# Patient Record
Sex: Female | Born: 1935 | Race: White | Hispanic: No | Marital: Married | Smoking: Never smoker
Health system: Southern US, Community
[De-identification: ages and names within clinical notes are randomized; demographics above are authoritative.]

## PROBLEM LIST (undated history)

## (undated) DIAGNOSIS — G4730 Sleep apnea, unspecified: Secondary | ICD-10-CM

## (undated) DIAGNOSIS — R609 Edema, unspecified: Secondary | ICD-10-CM

## (undated) DIAGNOSIS — D649 Anemia, unspecified: Secondary | ICD-10-CM

## (undated) DIAGNOSIS — E079 Disorder of thyroid, unspecified: Secondary | ICD-10-CM

## (undated) DIAGNOSIS — I10 Essential (primary) hypertension: Secondary | ICD-10-CM

## (undated) DIAGNOSIS — I82409 Acute embolism and thrombosis of unspecified deep veins of unspecified lower extremity: Secondary | ICD-10-CM

## (undated) DIAGNOSIS — H409 Unspecified glaucoma: Secondary | ICD-10-CM

## (undated) DIAGNOSIS — E785 Hyperlipidemia, unspecified: Secondary | ICD-10-CM

## (undated) DIAGNOSIS — IMO0002 Reserved for concepts with insufficient information to code with codable children: Secondary | ICD-10-CM

## (undated) DIAGNOSIS — N840 Polyp of corpus uteri: Secondary | ICD-10-CM

## (undated) DIAGNOSIS — H9190 Unspecified hearing loss, unspecified ear: Secondary | ICD-10-CM

## (undated) DIAGNOSIS — D689 Coagulation defect, unspecified: Secondary | ICD-10-CM

## (undated) DIAGNOSIS — K469 Unspecified abdominal hernia without obstruction or gangrene: Secondary | ICD-10-CM

## (undated) DIAGNOSIS — K219 Gastro-esophageal reflux disease without esophagitis: Secondary | ICD-10-CM

## (undated) DIAGNOSIS — K449 Diaphragmatic hernia without obstruction or gangrene: Secondary | ICD-10-CM

## (undated) DIAGNOSIS — H269 Unspecified cataract: Secondary | ICD-10-CM

## (undated) DIAGNOSIS — R32 Unspecified urinary incontinence: Secondary | ICD-10-CM

## (undated) DIAGNOSIS — M1990 Unspecified osteoarthritis, unspecified site: Secondary | ICD-10-CM

## (undated) DIAGNOSIS — Z8711 Personal history of peptic ulcer disease: Secondary | ICD-10-CM

## (undated) DIAGNOSIS — Z820 Family history of epilepsy and other diseases of the nervous system: Secondary | ICD-10-CM

## (undated) DIAGNOSIS — I999 Unspecified disorder of circulatory system: Secondary | ICD-10-CM

## (undated) DIAGNOSIS — R791 Abnormal coagulation profile: Secondary | ICD-10-CM

## (undated) DIAGNOSIS — Z86718 Personal history of other venous thrombosis and embolism: Secondary | ICD-10-CM

## (undated) DIAGNOSIS — E039 Hypothyroidism, unspecified: Secondary | ICD-10-CM

## (undated) HISTORY — DX: Family history of epilepsy and other diseases of the nervous system: Z82.0

## (undated) HISTORY — DX: Personal history of peptic ulcer disease: Z87.11

## (undated) HISTORY — DX: Personal history of other venous thrombosis and embolism: Z86.718

## (undated) HISTORY — DX: Unspecified disorder of circulatory system: I99.9

## (undated) HISTORY — PX: PR REVISION PERI-IMPLANT CAPSULE BREAST: 19370

## (undated) HISTORY — DX: Abnormal coagulation profile: R79.1

## (undated) HISTORY — DX: Essential (primary) hypertension: I10

## (undated) HISTORY — DX: Hypothyroidism, unspecified: E03.9

## (undated) HISTORY — PX: PR THYROIDECTOMY: 60240

## (undated) HISTORY — DX: Unspecified hearing loss, unspecified ear: H91.90

## (undated) HISTORY — DX: Unspecified osteoarthritis, unspecified site: M19.90

## (undated) HISTORY — DX: Unspecified abdominal hernia without obstruction or gangrene: K46.9

## (undated) HISTORY — PX: TONSILLECTOMY: SUR1361

## (undated) HISTORY — DX: Sleep apnea, unspecified: G47.30

## (undated) HISTORY — DX: Acute embolism and thrombosis of unspecified deep veins of unspecified lower extremity: I82.409

## (undated) HISTORY — DX: Disorder of thyroid, unspecified: E07.9

## (undated) HISTORY — PX: HERNIA REPAIR: SHX51

## (undated) HISTORY — PX: EYE SURGERY: SHX253

## (undated) HISTORY — DX: Polyp of corpus uteri: N84.0

## (undated) HISTORY — PX: OTHER SURGICAL HISTORY: SHX169

## (undated) HISTORY — DX: Edema, unspecified: R60.9

## (undated) HISTORY — DX: Hyperlipidemia, unspecified: E78.5

## (undated) HISTORY — DX: Coagulation defect, unspecified: D68.9

## (undated) HISTORY — DX: Gastro-esophageal reflux disease without esophagitis: K21.9

## (undated) HISTORY — DX: Anemia, unspecified: D64.9

## (undated) HISTORY — DX: Unspecified glaucoma: H40.9

## (undated) HISTORY — PX: CATARACT EXTRACTION: SUR2

## (undated) HISTORY — PX: REFRACTIVE SURGERY: SHX103

## (undated) HISTORY — DX: Unspecified urinary incontinence: R32

## (undated) HISTORY — DX: Unspecified cataract: H26.9

## (undated) HISTORY — DX: Reserved for concepts with insufficient information to code with codable children: IMO0002

## (undated) HISTORY — DX: Diaphragmatic hernia without obstruction or gangrene: K44.9

---

## 1987-08-15 HISTORY — PX: PR LAP,HERNIA REPAIR PROC,UNLIST: 49659

## 1999-01-25 ENCOUNTER — Other Ambulatory Visit: Admission: RE | Admit: 1999-01-25 | Discharge: 1999-01-25 | Payer: Self-pay | Admitting: *Deleted

## 1999-01-25 ENCOUNTER — Encounter (INDEPENDENT_AMBULATORY_CARE_PROVIDER_SITE_OTHER): Payer: Self-pay | Admitting: Specialist

## 2000-04-10 ENCOUNTER — Other Ambulatory Visit: Admission: RE | Admit: 2000-04-10 | Discharge: 2000-04-10 | Payer: Self-pay | Admitting: *Deleted

## 2001-03-13 ENCOUNTER — Ambulatory Visit (HOSPITAL_COMMUNITY): Admission: RE | Admit: 2001-03-13 | Discharge: 2001-03-13 | Payer: Self-pay | Admitting: Neurology

## 2001-06-18 ENCOUNTER — Other Ambulatory Visit: Admission: RE | Admit: 2001-06-18 | Discharge: 2001-06-18 | Payer: Self-pay | Admitting: *Deleted

## 2002-08-14 HISTORY — PX: PR REMV 2ND CATARACT,CORN-SCLER SECTN: 66830

## 2004-03-28 ENCOUNTER — Ambulatory Visit

## 2004-04-05 ENCOUNTER — Encounter: Payer: Self-pay | Admitting: Internal Medicine

## 2004-04-05 ENCOUNTER — Ambulatory Visit: Admitting: Internal Medicine

## 2004-04-05 VITALS — BP 128/80 | HR 76 | Temp 76.0°F | Resp 16 | Ht 67.0 in | Wt 198.0 lb

## 2004-04-05 DIAGNOSIS — I10 Essential (primary) hypertension: Secondary | ICD-10-CM | POA: Insufficient documentation

## 2004-04-05 DIAGNOSIS — E7800 Pure hypercholesterolemia, unspecified: Secondary | ICD-10-CM | POA: Insufficient documentation

## 2004-04-05 DIAGNOSIS — M810 Age-related osteoporosis without current pathological fracture: Secondary | ICD-10-CM | POA: Insufficient documentation

## 2004-04-05 DIAGNOSIS — N393 Stress incontinence (female) (male): Secondary | ICD-10-CM | POA: Insufficient documentation

## 2004-04-05 DIAGNOSIS — H9190 Unspecified hearing loss, unspecified ear: Secondary | ICD-10-CM | POA: Insufficient documentation

## 2004-04-05 MED ORDER — EVISTA 60 MG TABLET
ORAL_TABLET | ORAL | Status: DC
Start: 2004-04-05 — End: 2005-03-20

## 2004-04-05 MED ORDER — ALTACE 10 MG CAPSULE
ORAL_CAPSULE | ORAL | Status: DC
Start: 2004-04-05 — End: 2004-06-07

## 2004-04-05 MED ORDER — TOPROL XL 100 MG TABLET,EXTENDED RELEASE
EXTENDED_RELEASE_TABLET | ORAL | Status: DC
Start: 2004-04-05 — End: 2005-04-24

## 2004-04-05 NOTE — Progress Notes (Signed)
Melinda [name] presents for preventive care. Her last physical was 1 year(s) ago. In general she has been feeling well and functioning well at home, work, and personal relationships..    Hypertension: Patient has been checking blood pressure and reports readings of 120-130/80. She takes medications regularly, ran out of Altace 2 weks ago. Current cardiovascular symptoms are none.     Dyslipidemia: She engages in minimal exercise and describes her diet as low fat/low cholesterol.   Last lipid panel shows is outdated. She is taking a statin, but ran out 2 months ago.    Previous Paps have been normal. Previous mammograms have been not done.       There is no immunization history on file for this patient.    Family history is significant for cancer in mother, father MI.    Sexual History: is heterosexual and monogamous    Social history: I did review and update substance use history form.    Safety issues: The patient does routinely use seatbelts.       ROS:    Constitutional: negative.  Eyes: cataract OU removed last year.  Ears, Nose, Mouth, Throat: negative.  CV: negative.  Resp: negative.  GI: negative.  GU: incontinence.  Musculoskeletal: negative.  Integumentary: negative.  Neuro: negative.  Psych: negative.  Endo: negative.  Heme/Lymphatic: negative.  Allergy/Immun: hay fever.  GYN: negative.      Exam:    General Appearance: skin warm, dry, and pink, cooperative, distress - no distress, deaf, sign interpretor present.  Eyes: conjunctivae and corneas clear. PERRL, EOM's intact. Fundi benign.  Mouth: normal.  Neck: Neck supple. No adenopathy, thyroid symmetric, normal size.  Heart: normal rate and regular rhythm, no murmurs, clicks, or gallops.  Lungs: clear to auscultation and percussion, no chest deformities noted.  Extremities: no cyanosis, clubbing, or edema.  Skin: negative.  Neuro: Gait normal. Reflexes normal and symmetric. Sensation and strength grossly normal and deaf bilateral.  Mental Status:  Appearance/Cooperation: in no apparent distress, alert, afebrile, normal vitals, cooperative and well hydrated.    ASSESSMENT:    V70.0 ROUTINE MEDICAL EXAM (primary encounter diagnosis)  Note: normal examination  Plan: CBC WITH AUTO DIFFERENTIAL, COMPREHENSIVE    CHEMISTRY PANEL, LIPID PANEL WITH DLDL REFLEX,    TSH WITH FREE T4 REFLEX, MAMMOGRAPHY SCREENING       401.9 HYPERTENSION NOS  Note: BP good  Plan: continue current medications    272.4 HYPERLIPIDEMIA NEC/NOS  Note: off pravachol for 1-2 months  Plan: check lipids    625.6 FEMALE STRESS INCONTINENCE  Note: worsening  Plan: OB/GYN - UROGYNECOLOGY REFERRAL       733.00 OSTEOPOROSIS NOS  Note: arm fx 2002  Plan: resume Evista    389.9 HEARING LOSS NOS  Note:   Plan:     I did review patients past medical and family/social history.  Barriers to Learning assessed: none. Patient verbalizes understanding of teaching and instructions.    Melinda [name], M.D.

## 2004-04-05 NOTE — Nursing Note (Signed)
>>   [name], Melinda     04/05/2004   11:13 am  vitals done, allergies verified, screened for pain  Melinda [name] l.v.n

## 2004-04-05 NOTE — Patient Instructions (Signed)
Please do fasting blood tests prior to next appointment.

## 2004-05-11 ENCOUNTER — Ambulatory Visit

## 2004-05-11 ENCOUNTER — Other Ambulatory Visit: Payer: Self-pay | Admitting: Internal Medicine

## 2004-05-11 ENCOUNTER — Telehealth: Payer: Self-pay | Admitting: Internal Medicine

## 2004-05-11 MED ORDER — AMBIEN 10 MG TABLET
ORAL_TABLET | ORAL | Status: DC
Start: 2004-05-11 — End: 2004-07-26

## 2004-05-11 MED ORDER — SUDAFED 12 HOUR 120 MG TABLET,EXTENDED RELEASE
EXTENDED_RELEASE_TABLET | ORAL | Status: DC
Start: 2004-05-11 — End: 2007-05-31

## 2004-05-11 NOTE — Telephone Encounter (Signed)
>>   Melinda [name] Wed May 11, 2004 4:51 PM  patient walked into clinic today requesting rx for 12 hr sudafed and a sleeping pill. rx written for both and given to patient. [facility]

## 2004-05-25 ENCOUNTER — Telehealth: Payer: Self-pay | Admitting: Internal Medicine

## 2004-05-25 NOTE — Telephone Encounter (Signed)
>>   [name] V Wed May 25, 2004 2:05 PM  The patient completed the flu questionnaire and signed the consent. The Inactivated Influenza Vaccine VIS document was given to the patient to review and any questions they had were answered. The consent was then reviewed for any Yes answers. All questions were answered as No. The Influenza Vaccine was then administered per protocol. The patient was observed for immediate reactions to the vaccine per protocol. None were observed.  Mouang V. [place] MA      >> [facility] Wed May 25, 2004 1:56 PM  husband here for appt today, she wants flu shot also. [facility]

## 2004-06-07 ENCOUNTER — Ambulatory Visit: Admitting: Internal Medicine

## 2004-06-07 ENCOUNTER — Ambulatory Visit

## 2004-06-07 ENCOUNTER — Encounter: Payer: Self-pay | Admitting: Internal Medicine

## 2004-06-07 VITALS — HR 68 | Resp 12 | Ht 67.0 in | Wt 198.2 lb

## 2004-06-07 MED ORDER — ALTACE 10 MG CAPSULE
ORAL_CAPSULE | ORAL | Status: DC
Start: 2004-06-07 — End: 2004-06-30

## 2004-06-07 MED ORDER — LOVASTATIN 20 MG TABLET
ORAL_TABLET | ORAL | Status: DC
Start: 2004-06-07 — End: 2005-01-19

## 2004-06-07 NOTE — Nursing Note (Signed)
>>   [name], MOUANG V     06/07/2004   1:26 pm  Vital signs taken, allergies verified, screened for pain. Mouang Guinevere Ferrari, MA

## 2004-06-07 NOTE — Progress Notes (Signed)
Patient presents with:    Health Maintenance - follow up   Refill Request   Mole - check around neck around   Incontinence - referral to urologist   Blood Pressure      Subjective: Melinda [name] is a(n) 68yr old female who presents for follow up of hypertension.    Current symptoms are none. She has been treated with meds for 4 month(s), and states she takes medications regularly. Side effects noted are none; which are well tolerated.     Patient has not been checking ambulatory blood pressure.    moles on face and neck, enlarging, no h/o skin Ca    h/o urine incontinance, takes sudafed which helps a little.    Dyslipidemia: She engages in minimal exercise and describes her diet as low fat/low cholesterol.   Last lipid panel shows high LDL. She is taking no meds, diet control.  ROS:    Constitutional: negative.  Eyes: negative.  Ears, Nose, Mouth, Throat: negative.  CV: negative.  Resp: negative.      History:  I did review patients past medical and family/social history.    Objective: BP 150/p    General Appearance: skin warm, dry, and pink, cooperative, distress - no distress. interpretor present.  Eyes: negative.  Neck: Neck supple. No adenopathy, thyroid symmetric, normal size.  Heart: normal rate and regular rhythm, no murmurs, clicks, or gallops.  Lungs: clear to auscultation and percussion, no chest deformities noted.  Skin: scattered light brown nevi on face, neck, 2-6 mm.      Assessment: Essential hypertension not adequately controlled.  See Diagnoses Section.    Plan: See Orders.  1) Medication: dosage change: altace 10 bid  2) Dietary sodium restriction  3) Regular aerobic exercise    401.9 HYPERTENSION NOS (primary encounter diagnosis)  Note:   Plan: ALTACE 10 MG CAP       272.4 HYPERLIPIDEMIA NEC/NOS  Note: LDL 160  Plan: LOVASTATIN 20 MG TAB   qd    625.6 FEMALE STRESS INCONTINENCE  Note: worsening sx  Plan: OB/GYN - UROGYNECOLOGY REFERRAL       389.9 HEARING LOSS NOS  Note: stable  Plan:          Patient Instruction: Reviewed risks of hypertension and principles of treatment.   See Patient Education and Orders Sections.     Barriers to Learning assessed: none. Patient verbalizes understanding of teaching and instructions.

## 2004-06-14 ENCOUNTER — Ambulatory Visit

## 2004-06-20 ENCOUNTER — Telehealth: Payer: Self-pay | Admitting: Internal Medicine

## 2004-06-20 NOTE — Telephone Encounter (Signed)
>>   [name] V Fri Jun 24, 2004 9:36 AM  PA came back indicating the PA was faxed to the wrong place so I called [employer] and per [name] Ambien was approved on 06/13/2004-06/13/2005. Notified patient and pharmacy.  [name] V MA      >> [name] Jun 20, 2004 4:57 PM  Pa faxed.  [name] MA      >> [name] Jun 20, 2004 4:57 PM  PA signed, pls fax. [facility]      >> [name] Jun 20, 2004 2:23 PM  Per pharmacy fax: Ambien requires a PA. PA has been started and is at your work station. [name] MA      >> [name] Mon Jun 20, 2004 2:19 PM  Encounter initiated.

## 2004-06-29 ENCOUNTER — Other Ambulatory Visit: Payer: Self-pay | Admitting: Internal Medicine

## 2004-06-29 NOTE — Telephone Encounter (Signed)
>>   [name] V Wed Jun 29, 2004 5:11 PM  Left message to notify patient through relay.  Mouang [place] V MA      >> [facility] Wed Jun 29, 2004 5:06 PM  altace does not come in 20 mg tablets, the only way to take 20 mg per day is to take two 10 mg tabs a day. [facility]        >> Manya Silvas Jun 29, 2004 4:35 PM  Leonette Most form [facility] is calling for patient. She would like to have the medication Altace changed to 20mg  1 x per day. Patient Ins will only cover 30 day supply.

## 2004-06-30 ENCOUNTER — Other Ambulatory Visit: Payer: Self-pay | Admitting: Internal Medicine

## 2004-06-30 MED ORDER — ALTACE 10 MG CAPSULE
ORAL_CAPSULE | ORAL | Status: DC
Start: 2004-06-30 — End: 2005-03-20

## 2004-06-30 NOTE — Telephone Encounter (Signed)
>>   [name] Tue Jul 26, 2004 10:32 AM  Ambien last refill dated 06/24/04.per pharmacy fax    >> Melinda [name] Thu Jun 30, 2004 2:23 PM  rx altace sent to [facility]. take altace and toprol in morning, take lovastatin in evening. [facility]        >> Melinda [name] Thu Jun 30, 2004 1:42 PM  Patient called through relay, she is kind of confuse because [facility] pharmacy did tell her that Altace comes in 20mg  so now she is not so confident about [facility], she is going back to [facility]. Please send rx to [facility]. Patient also wants to know if altace is to prevent heart attack and stroke, toprol for blood pressure and lovastatin for cholesterol. What interval should she take them all in a day? Please advise.  Melinda Herrlich V MA

## 2004-07-05 ENCOUNTER — Encounter: Admitting: Internal Medicine

## 2004-07-11 ENCOUNTER — Ambulatory Visit

## 2004-07-12 ENCOUNTER — Encounter: Admitting: Internal Medicine

## 2004-07-14 ENCOUNTER — Encounter: Admitting: Internal Medicine

## 2004-07-18 ENCOUNTER — Ambulatory Visit

## 2004-07-20 ENCOUNTER — Ambulatory Visit

## 2004-07-21 ENCOUNTER — Encounter: Admitting: Internal Medicine

## 2004-07-21 ENCOUNTER — Encounter: Admitting: Nurse Practitioner

## 2004-07-26 MED ORDER — AMBIEN 10 MG TABLET
ORAL_TABLET | ORAL | Status: DC
Start: 2004-07-26 — End: 2004-08-22

## 2004-08-22 ENCOUNTER — Other Ambulatory Visit: Payer: Self-pay | Admitting: Internal Medicine

## 2004-08-22 MED ORDER — AMBIEN 10 MG TABLET
ORAL_TABLET | ORAL | Status: DC
Start: 2004-08-22 — End: 2004-09-19

## 2004-08-22 NOTE — Telephone Encounter (Signed)
>>   Carolan Shiver Aug 22, 2004 4:17 PM  last refill: 07/26/04  pharmacy fax request  [name] MA

## 2004-08-25 ENCOUNTER — Ambulatory Visit

## 2004-09-07 ENCOUNTER — Encounter: Admitting: Internal Medicine

## 2004-09-07 ENCOUNTER — Encounter: Admitting: Nurse Practitioner

## 2004-09-07 ENCOUNTER — Ambulatory Visit

## 2004-09-07 NOTE — Nursing Note (Signed)
>>   NGUYEN, HUYEN T     09/07/2004   4:36 pm  blood pressure taken. [name] MA

## 2004-09-09 ENCOUNTER — Ambulatory Visit: Admitting: Internal Medicine

## 2004-09-09 VITALS — HR 68 | Temp 97.3°F | Resp 12 | Wt 203.0 lb

## 2004-09-09 NOTE — Patient Instructions (Signed)
Please do fasting blood tests prior to next appointment.

## 2004-09-09 NOTE — Progress Notes (Signed)
Patient presents with:    Health Maintenance - F/U      Subjective: Melinda [name] is a(n) 69yr old female who presents for follow up of hypertension.    Current symptoms are none. She has been treated with meds for 4 month(s), and states she takes medications regularly. Side effects noted are none; which are well tolerated.     Patient has not been checking ambulatory blood pressure.    ROS:    Constitutional: negative.  Eyes: negative.  Ears, Nose, Mouth, Throat: negative.  CV: negative.  Resp: negative.      History:  I did review patients past medical and family/social history.    Objective:     General Appearance: skin warm, dry, and pink, cooperative, distress - no distress.  Neck: negative.  Heart: normal rate and regular rhythm, no murmurs, clicks, or gallops.  Lungs: clear to auscultation and percussion, no chest deformities noted.  Abdomen: BS normal. Abdomen soft, non-tender. No masses or organomegaly.  Extremities: no cyanosis, clubbing, or edema.  Skin: negative.      Assessment: Essential hypertension adequately controlled.  See Diagnoses Section.    Plan: See Orders.  1) Medication: continue current medication regimen unchanged  2) Dietary sodium restriction  3) Regular aerobic exercise    401.9 HYPERTENSION NOS (primary encounter diagnosis)  Note:   Plan:     272.4 HYPERLIPIDEMIA NEC/NOS  Note: on statin  Plan: LIPID PANEL WITH DLDL REFLEX, ALANINE    TRANSFERASE (ALT), ASPARTATE TRANSAMINASE (AST)   continue current medications    389.9 HEARING LOSS NOS  Note: stable  Plan:     V70.0 ROUTINE MEDICAL EXAM  Note:   Plan: MAMMOGRAPHY SCREENING     Patient Instruction: Reviewed risks of hypertension and principles of treatment.   See Patient Education and Orders Sections.     Barriers to Learning assessed: none. Patient verbalizes understanding of teaching and instructions.

## 2004-09-09 NOTE — Nursing Note (Signed)
>>   POLK, NICOLE     09/09/2004   2:47 pm  Vital signs taken, allergies verified, screened for pain, med hx taken (if appropriate). Harland Dingwall M.A.

## 2004-09-14 ENCOUNTER — Encounter: Admitting: Nurse Practitioner

## 2004-09-14 ENCOUNTER — Ambulatory Visit

## 2004-09-15 ENCOUNTER — Encounter: Admitting: OBSTETRICS/GYN

## 2004-09-15 ENCOUNTER — Other Ambulatory Visit: Payer: Self-pay | Admitting: OBSTETRICS/GYN

## 2004-09-17 LAB — CULTURE URINE, BACTI

## 2004-09-19 ENCOUNTER — Other Ambulatory Visit: Payer: Self-pay | Admitting: Internal Medicine

## 2004-09-19 MED ORDER — AMBIEN 10 MG TABLET
ORAL_TABLET | ORAL | Status: DC
Start: 2004-09-19 — End: 2004-12-09

## 2004-09-19 NOTE — Telephone Encounter (Signed)
>>   [name] Mon Sep 19, 2004 11:34 AM  faxed from pharmacy  last refilled:08/22/2004  Mouang V. Larena Glassman

## 2004-10-07 ENCOUNTER — Ambulatory Visit

## 2004-10-07 ENCOUNTER — Telehealth: Payer: Self-pay | Admitting: Internal Medicine

## 2004-10-07 NOTE — Telephone Encounter (Signed)
>>   Terri Skains Fri Oct 07, 2004 5:03 PM  Labs at front desk    >> Judith Blonder Fri Oct 07, 2004 4:46 PM  Labs ordered. [facility]      >> KATIE Dan Europe Fri Oct 07, 2004 12:41 PM  Lab slip request for recheck of cholesterol.Patient will be in on Monday for the lab draw.

## 2004-10-10 ENCOUNTER — Telehealth: Payer: Self-pay | Admitting: Internal Medicine

## 2004-10-10 NOTE — Telephone Encounter (Signed)
>>   Terri Skains Tue Oct 11, 2004 9:56 AM  Note faxed    >> Judith Blonder Tue Oct 11, 2004 8:41 AM  Note written, pls fax. [facility]        >> Darl Pikes ZOX Oct 10, 2004 4:13 PM  Patient is requesting a letter stating that she takes sudafed 12 hour for incontinence so she can deduct the cost of sudafed on her taxes.Please fax the letter to [phone].   Cora E Rafanan

## 2004-10-13 ENCOUNTER — Encounter: Admitting: Internal Medicine

## 2004-10-14 ENCOUNTER — Ambulatory Visit

## 2004-10-21 ENCOUNTER — Other Ambulatory Visit: Payer: Self-pay | Admitting: Internal Medicine

## 2004-10-21 NOTE — Telephone Encounter (Signed)
>>   [name] Tue Oct 25, 2004 11:39 AM  Notified patient.  [name] V MA    >> [name] Tue Oct 25, 2004 9:49 AM  Called patient, phone busy.  [name] V MA      >> [facility] Tue Oct 25, 2004 9:38 AM  This needs to be refilled by Ophthalmology. [facility]      >> [name] V Tue Oct 25, 2004 9:29 AM  Spoke with patient through impaired hearing, she is using Travatan 0.004% eye drops 1 drops before bedtime. Please send rx to local pharmacy.Patient will check with pharmacy to see when she can pick up.  [name] V MA      >> [name] Oct 24, 2004 4:26 PM  Called phone busy.  Mouang [place] V MA      >> Felicita Gage Oct 24, 2004 3:50 PM  Pt returning MA's call, pt says this medication was originally ordered by pt's eye Dr, and there office in closed on Mondays, so pt is requesting Dr Gregary Cromer to refill, and pt uses drops for Glaucoma.    >> [name] V Fri Oct 21, 2004 4:54 PM  Left message with impaired hearing for patient to call back.  Mouang [place] V MA      >> GREG REDMOND Fri Oct 21, 2004 4:29 PM  I have no record that Dr. Gregary Cromer ever prescribed this. Please call the patient to confirm dosage and frequency. Thank you.      >> Heide Guile Fri Oct 21, 2004 4:16 PM  Pharmacy faxed request. Last filled 09/22/04. [name] MA

## 2004-11-02 ENCOUNTER — Encounter: Admitting: Internal Medicine

## 2004-11-16 ENCOUNTER — Ambulatory Visit

## 2004-11-17 ENCOUNTER — Encounter: Admitting: OBSTETRICS/GYN

## 2004-11-18 ENCOUNTER — Encounter

## 2004-12-09 ENCOUNTER — Encounter: Admitting: Internal Medicine

## 2004-12-09 ENCOUNTER — Other Ambulatory Visit: Payer: Self-pay | Admitting: Internal Medicine

## 2004-12-09 MED ORDER — AMBIEN 10 MG TABLET
ORAL_TABLET | ORAL | Status: DC
Start: 2004-12-09 — End: 2005-02-24

## 2004-12-09 NOTE — Telephone Encounter (Signed)
>>   Melinda [name] Fri Dec 09, 2004 3:28 PM  Pharmacy faxed request. Last filled 11/14/04. [name] MA

## 2004-12-15 ENCOUNTER — Encounter: Admitting: OBSTETRICS/GYN

## 2004-12-15 ENCOUNTER — Ambulatory Visit

## 2004-12-23 ENCOUNTER — Ambulatory Visit: Admitting: Internal Medicine

## 2004-12-23 VITALS — BP 122/62 | HR 80 | Resp 16 | Wt 201.5 lb

## 2004-12-23 NOTE — Progress Notes (Signed)
Patient presents with:    Osteoporosis - Need Dexa Scan   Allergic Reaction - feet turned blue after taking Lovastatin & Altace       Subjective: Melinda [name] is a(n) 69yr old female who presents for follow up of hypertension.    Current symptoms are none. She has been treated with meds for 4 month(s), and states she takes medications regularly. Side effects noted are none; which are well tolerated.     Patient has been checking blood pressure and reports readings of normal.    H/o osteoporosis, takes actonel, needs repeat DEXA. Also needs mammo.    ROS:    Constitutional: negative.  Eyes: negative.  Ears, Nose, Mouth, Throat: negative.  CV: negative.  Resp: negative.      History:  I did review patients past medical and family/social history.    Objective:     General Appearance: skin warm, dry, and pink, cooperative. Deaf.  Neck: Neck supple. No adenopathy, thyroid symmetric, normal size.  Heart: normal rate and regular rhythm, no murmurs, clicks, or gallops.  Lungs: clear to auscultation and percussion, no chest deformities noted.  Extremities: no cyanosis, clubbing, or edema.  Skin: negative.      Assessment: Essential hypertension adequately controlled.  See Diagnoses Section.    Plan: See Orders.  1) Medication: continue current medication regimen unchanged  2) Dietary sodium restriction  3) Regular aerobic exercise    401.9 HYPERTENSION NOS (primary encounter diagnosis)  Note:   Plan:     272.4 HYPERLIPIDEMIA NEC/NOS  Note:   Plan: COMPREHENSIVE CHEMISTRY PANEL, LIPID PANEL WITH   DLDL REFLEX   continue current medications    733.00 OSTEOPOROSIS NOS  Note:   Plan: DEXA, COMPLETE,   continue current medications    V70.0 ROUTINE MEDICAL EXAM  Note:   Plan: MAMMOGRAPHY SCREENING    CBC WITH AUTO DIFFERENTIAL, TSH WITH FREE T4    REFLEX         Patient Instruction: Reviewed risks of hypertension and principles of treatment.   See Patient Education and Orders Sections.     Barriers to Learning assessed: none.  Patient verbalizes understanding of teaching and instructions.

## 2004-12-23 NOTE — Patient Instructions (Signed)
Please do fasting blood tests prior to next appointment.

## 2004-12-23 NOTE — Nursing Note (Signed)
>>   [name], Melinda V     12/23/2004   2:36 pm  Vital signs taken, allergies verified, screened for pain. Melinda Guinevere Ferrari, MA

## 2005-01-19 ENCOUNTER — Other Ambulatory Visit: Payer: Self-pay | Admitting: Internal Medicine

## 2005-01-19 MED ORDER — LOVASTATIN 20 MG TABLET
ORAL_TABLET | ORAL | Status: DC
Start: 2005-01-19 — End: 2006-01-03

## 2005-01-19 NOTE — Telephone Encounter (Signed)
>>   Melinda [name] Thu Jan 19, 2005 9:29 AM  pharmacy faxed request.last fill date [phone]

## 2005-02-02 ENCOUNTER — Other Ambulatory Visit: Payer: Self-pay | Admitting: Internal Medicine

## 2005-02-02 NOTE — Telephone Encounter (Signed)
>>   Melinda [name] Thu Feb 02, 2005 9:53 AM  This is too early. Denied. Thank you.      >> Melinda [name] Thu Feb 02, 2005 9:47 AM  last refill date 0621/06   per pharmacy fax.

## 2005-02-03 ENCOUNTER — Telehealth: Payer: Self-pay | Admitting: Internal Medicine

## 2005-02-03 NOTE — Telephone Encounter (Signed)
>>   Quick Note by Franky Macho Mon Feb 06, 2005 2:00 PM  Tried to cll pt again, once again the fax came on. Beverly Sessions, RN          >> Quick Note by Franky Macho Fri Feb 03, 2005 5:14 PM  Tried to call pt it is a fax #.Beverly Sessions, RN          >> Melinda [name] Feb 03, 2005 3:21 PM  If she drinks only a small amount at a time of day other than when she takes medication, it should be OK. Please notify.    >> Melinda [name] Fri Feb 03, 2005 2:54 PM  Patient calling wanting to know if it's safe for pt to drink pomegranate juice with the following medications,Toprol XL,Lovastatin,Altace and Ambien. Please advise.

## 2005-02-21 ENCOUNTER — Ambulatory Visit

## 2005-02-24 ENCOUNTER — Other Ambulatory Visit: Payer: Self-pay | Admitting: Internal Medicine

## 2005-02-24 MED ORDER — AMBIEN 10 MG TABLET
ORAL_TABLET | ORAL | Status: DC
Start: 2005-02-24 — End: 2005-06-16

## 2005-02-24 NOTE — Telephone Encounter (Signed)
>>   Melinda [name] Fri Feb 24, 2005 3:52 PM  faxed from pharmacy  last refilled:02/01/2005  Melinda [name]

## 2005-02-28 ENCOUNTER — Encounter: Admitting: Internal Medicine

## 2005-03-16 ENCOUNTER — Ambulatory Visit

## 2005-03-20 ENCOUNTER — Encounter: Admitting: Internal Medicine

## 2005-03-20 ENCOUNTER — Ambulatory Visit: Admitting: Internal Medicine

## 2005-03-20 ENCOUNTER — Ambulatory Visit

## 2005-03-20 VITALS — BP 110/62 | HR 68 | Temp 97.8°F | Resp 16 | Wt 198.9 lb

## 2005-03-20 MED ORDER — EVISTA 60 MG TABLET
ORAL_TABLET | ORAL | Status: DC
Start: 2005-03-20 — End: 2005-10-13

## 2005-03-20 NOTE — Nursing Note (Signed)
>>   [name], MOUANG V     03/20/2005   3:16 pm  Vital signs taken, allergies verified, screened for pain. Mouang Guinevere Ferrari, MA

## 2005-03-20 NOTE — Progress Notes (Signed)
Patient presents with:    Dizziness   Shortness Of Breath      Subjective: Melinda [name] is a(n) 69yr old female who presents for follow up of hypertension.    Current symptoms are dizzy. She has been treated with meds for 4 month(s), and states she takes medications regularly. Side effects noted are orthostatic lightheadedness and dizzy; which are poorly tolerated.     Patient has not been checking ambulatory blood pressure.    H/o osteoporosis, did not start Evista as rx'd last yr, she wants to start it now.    ROS:    Constitutional: negative.  Eyes: negative.  Ears, Nose, Mouth, Throat: negative.  CV: negative.  Resp: negative.  GI: negative.      History:  I did review patients past medical and family/social history.    Objective:     General Appearance: skin warm, dry, and pink, cooperative.  Neck: Neck supple. No adenopathy, thyroid symmetric, normal size.  Heart: normal rate and regular rhythm, no murmurs, clicks, or gallops.  Lungs: clear to auscultation and percussion, no chest deformities noted.  Extremities: no cyanosis, clubbing, or edema.  Skin: negative.  Neuro: Gait normal. Reflexes normal and symmetric. Sensation and strength grossly normal.      Assessment: Essential hypertension not adequately controlled.  See Diagnoses Section.    Plan: See Orders.  1) Medication: dosage change: altace one qd due to dizzy from low BP  2) Dietary sodium restriction  3) Regular aerobic exercise    401.9 HYPERTENSION NOS (primary encounter diagnosis)  Note:   Plan: ALTACE 10 MG CAP, TSH WITH FREE T4 REFLEX       733.00 OSTEOPOROSIS NOS  Note:   Plan: EVISTA 60 MG TAB   qd    272.4 HYPERLIPIDEMIA NEC/NOS  Note:   Plan: CBC WITH AUTO DIFFERENTIAL, COMPREHENSIVE    CHEMISTRY PANEL, LIPID PANEL WITH DLDL REFLEX   continue current medications      Patient Instruction: Reviewed risks of hypertension and principles of treatment.   See Patient Education and Orders Sections.     Barriers to Learning assessed: none.  Patient verbalizes understanding of teaching and instructions.

## 2005-03-20 NOTE — Patient Instructions (Signed)
Please do fasting blood tests prior to next appointment.

## 2005-03-24 ENCOUNTER — Encounter: Admitting: Nurse Practitioner

## 2005-03-24 ENCOUNTER — Encounter: Payer: Self-pay | Admitting: Nurse Practitioner

## 2005-04-06 ENCOUNTER — Encounter: Admitting: Nurse Practitioner

## 2005-04-07 ENCOUNTER — Encounter: Payer: Self-pay | Admitting: Nurse Practitioner

## 2005-04-14 ENCOUNTER — Telehealth: Payer: Self-pay | Admitting: Internal Medicine

## 2005-04-14 HISTORY — PX: COLONOSCOPY: GILAB00002

## 2005-04-14 NOTE — Telephone Encounter (Signed)
Received relay call from TDY operator. Pt c/o dizziness which she was seen for on 8/7. At that time Dr. Gregary Cromer felt it was related to pt's med son he decreased her Altace to 10mg  daily. Pt says she continues to have the dizziness ok when she is sitting but if she gets up and moves around she feels very dizzy. Also when she has a BM she gets dizzy. Pt denies CP or SOB. Pt tries to stay hydrated but does now always drink 6-8 glasses of fluid. Pt's BP was 110/70 and pulse is 80 which she says is high for her. Pt also take toprol xl 1.5 tabs daily. Pt has f/u 9/11 with Dr. Gregary Cromer, pls advise.Beverly Sessions, RN

## 2005-04-14 NOTE — Telephone Encounter (Signed)
She could decrease her Toprol XL 100 mg to 1 tablet daily (currently on 1.5 tabs daily) and see if symptoms improve. She should call on call physician if needed over the weekend. Please notify.

## 2005-04-14 NOTE — Telephone Encounter (Signed)
Pt notified via TDY operator of info from MD. On-call MD # given. Patient verbalized understanding.  Beverly Sessions, RN

## 2005-04-19 ENCOUNTER — Ambulatory Visit: Admit: 2005-04-19

## 2005-04-19 ENCOUNTER — Ambulatory Visit

## 2005-04-19 ENCOUNTER — Telehealth: Payer: Self-pay | Admitting: Internal Medicine

## 2005-04-19 ENCOUNTER — Encounter

## 2005-04-19 NOTE — Telephone Encounter (Signed)
Pt came by office and would like to let dr. Gregary Cromer know that she feel much better when 1/2 pill of toprol xl was taken off. But pt blood pressure is still very low and pulse high. "Shoulde you eliminate or reduce dosage of more medicine? Altace" see original on dr. Gregary Cromer counter. Plese advise. Thanks. [name] MA

## 2005-04-21 ENCOUNTER — Encounter: Payer: Self-pay | Admitting: Nurse Practitioner

## 2005-04-21 ENCOUNTER — Encounter: Admitting: Nurse Practitioner

## 2005-04-21 NOTE — Telephone Encounter (Signed)
Spoke with pt through relay service, notified of dr. Sandria Manly note. [name] MA

## 2005-04-21 NOTE — Telephone Encounter (Signed)
Left message for patient to call back through relay [phone].  [name] V MA

## 2005-04-21 NOTE — Telephone Encounter (Signed)
Her blood tests show she is anemic, she may have bleeding ulcer so I am referring her to GI clinic for evaluation, urgent referral. Dc altace for now for low BP. [facility]

## 2005-04-24 ENCOUNTER — Ambulatory Visit: Admitting: Internal Medicine

## 2005-04-24 VITALS — BP 118/80 | HR 82 | Temp 97.6°F | Resp 14 | Wt 198.6 lb

## 2005-04-24 NOTE — Progress Notes (Signed)
Patient presents with:    Health Maintenance - 4 mo. f/u. discuss meds   Test Results - f/u labs   Tired      Subjective: Melinda [name] is a(n) 69yr old female who presents for follow up of hypertension.    Current symptoms are weakness. She has been treated with meds for 4 month(s), and states she takes medications regularly and dc'd altace and decreased toprol to one a day per my instructions. Side effects noted are none; which are well tolerated.     Patient has not been checking ambulatory blood pressure.    C/o fatigue , weak, tired x 3-4 weeks, rapid HR, no CP, nl BM, no melena, hematochezia    ROS:    Constitutional: negative.  Eyes: negative.  Ears, Nose, Mouth, Throat: negative.  CV: negative.  Resp: negative.  GI: negative.  GU: negative.      History:  I did review patient's past medical and family/social history.    Objective:     General Appearance: skin warm, dry, and pink, cooperative.  Eyes: negative.  Neck: Neck supple. No adenopathy, thyroid symmetric, normal size.  Heart: normal rate and regular rhythm, no murmurs, clicks, or gallops.  Lungs: clear to auscultation and percussion, no chest deformities noted.  Extremities: no cyanosis, clubbing, or edema.  Skin: negative.  Mental Status: Appearance/Cooperation: in no apparent distress, alert, afebrile, cooperative and sign interpretor present      Assessment: Essential hypertension adequately controlled.  See Diagnoses Section.    Plan: See Orders.  1) Medication: continue current medication regimen unchanged  2) Dietary sodium restriction  3) Regular aerobic exercise    285.9 ANEMIA NOS (primary encounter diagnosis)  Note: prob GI bleed  Plan: FERRITIN, IRON TOTAL, TRANSFERRIN   Refer to GI urgent    401.9 HYPERTENSION NOS  Note:   Plan: TOPROL XL 100 MG 24 HR TAB   Once a day     272.4 HYPERLIPIDEMIA NEC/NOS  Note:   Plan:       Patient Instruction: Reviewed risks of hypertension and principles of treatment.   See Patient Education and Orders  Sections.     Barriers to Learning assessed: none. Patient verbalizes understanding of teaching and instructions.

## 2005-04-24 NOTE — Nursing Note (Signed)
>>   NGUYEN, HUYEN T     04/24/2005   1:13 pm  Vitals taken, allergies reviewed, pain score evaluated.   [name] MA

## 2005-04-26 ENCOUNTER — Telehealth: Payer: Self-pay | Admitting: Internal Medicine

## 2005-04-26 NOTE — Telephone Encounter (Signed)
Left message for pt to call back through relay [phone]. [name] MA

## 2005-04-26 NOTE — Telephone Encounter (Signed)
Pt wants to let Dr Gregary Cromer know that she has not been called by GI Clinic re: URGENT referral, I gave pt GI phone#, to schedule an appt. Referral is pending in [facility].    Pt also needs to know if she should be taking iron pills or eating liver, or does she need to have a blood transfusion?

## 2005-04-26 NOTE — Telephone Encounter (Signed)
She can take iron sulfate 325 mg one tab three times a day. [facility]

## 2005-04-27 NOTE — Telephone Encounter (Signed)
Notified patient.  Dabria Wadas V MA

## 2005-05-02 ENCOUNTER — Encounter: Admitting: Gastroenterology

## 2005-05-04 ENCOUNTER — Encounter: Admitting: Gastroenterology

## 2005-05-04 ENCOUNTER — Other Ambulatory Visit: Payer: Self-pay | Admitting: Gastroenterology

## 2005-05-04 NOTE — Progress Notes (Addendum)
May 02, 2005 RE: [name], FREE    MR#: [id number]   DOB: 1936-08-04   Date of Service: 05/02/2005       [name], MD  PCN [place]    Dear [name]:     We saw Melinda [name] in the [facility] today in consultation for her iron deficiency anemia.    She currently has no gastrointestinal complaints, and her only complaints are related to her iron deficiency anemia. She notes dizziness and lightheadedness with minimal exertion, and also shortness of breath.    She denies specifically abdominal pain, heartburn, dysphagia, diarrhea, constipation, blood with her bowel movements, nausea, weight loss, fever or chills; i.e., we could not elicit any GI complaints.    On physical exam, her abdomen is soft. There is an umbilical hernia. There is only mild tenderness in the subxiphoid area. Bowel sounds are normal. Liver and spleen were not felt.    Her extremities are somewhat cold and poorly perfused, and there is some mild lower extremity edema.    We plan to perform an EGD on this coming Thursday and will follow with a colonoscopy the following Thursday.    Will write you again following the procedures.    Sincerely,          Massie Kluver, MD  ASSOCIATE PROFESSOR  DIVISION OF GASTROENTEROLOGY  DEPARTMENT OF INTERNAL MEDICINE  THIS WAS ELECTRONICALLY SIGNED - 05/04/2005 5:27 PM PST BY:                   AVW:UJ([id number])    D: 05/02/2005 01:30 PM  T: 05/04/2005 05:51 AM  C#: [id number]

## 2005-05-04 NOTE — Progress Notes (Deleted)
May 02, 2005 RE: GUERLINE, HAPP    MR#: [id number]   DOB: 1935-11-10   Date of Service: 05/02/2005       [name], MD  PCN     Dear [name]:    We saw Zi Sek in the  GI Clinic today in consultation for her iron deficiency anemia.     She currently has no gastrointestinal complaints and her only complaints are related to her iron deficiency anemia. She notes dizziness and lightheadedness with minimal exertion and also shortness of breath.     She denies specifically abdominal pain, heartburn, dysphagia, diarrhea, constipation, blood with her bowel movements, nausea, weight loss, fever or chills, i.e. we could not elicit any GI complaints.     On physical exam, her abdomen is soft. There is an umbilical hernia. There is only mild tenderness in the sub-xiphoid area. Her bowel sounds are normal and liver and spleen were not felt.     Her extremities are somewhat cool and poorly perfused and there is some mild lower extremity edema.     We plan to perform and EGD this coming Thursday and we will follow with a colonoscopy the following Thursday.     We will write you again following the procedures.     Sincerely,         Massie Kluver, MD  ASSOCIATE PROFESSOR  DIVISION OF GASTROENTEROLOGY  DEPARTMENT OF INTERNAL MEDICINE  "PRELIMINARY COPY"                  AVW:UJW([id number])    D: 05/02/2005 01:30 PM  T: 05/04/2005 04:57 AM  C#: [id number]

## 2005-05-08 NOTE — Procedures (Addendum)
PATIENT: Melinda [name], [name]   MR #: [id number] SEX: F AGE: 69  OPERATION DATE: 05/04/2005 DOB: 09/05/1935    INPATIENT OPERATION RECORD    PREOPERATIVE DIAGNOSIS: Anemia.    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    PROCEDURE: EGD.    Subprocedure: Random forceps biopsy.    MEDICATIONS: Fentanyl 75 mcg, Versed 3 mg.    CONSENT:    Obtained by the patient after discussion with the patient regarding risks and benefits of the procedure which include but are not limited to perforation, infection, bleeding, medication reaction. Patient signed the consent and it was placed in the permanent medical record.    INDICATION:    Please refer to the detailed gastroenterology consultation dictation. Briefly, this is a 69 year old woman with multiple medical problems who has chronic anemia. She denies any upper GI symptoms.    PHYSICAL EXAM:    Age-appropriate appearing woman in no apparent distress. Lungs: Clear. Heart: Regular rhythm and rate. Abdomen: Soft, nondistended, nontender to palpation. Patient is deaf and interview performed by sign language interpreter.    PROCEDURE:    Procedure was performed at the main endoscopy unit at the [facility] by Dr. Leona Carry assisted by Dr. Amanda Pea. Patient was placed in the left lateral decubitus position. Vital signs were monitored throughout the procedure which remained stable. Patient was given incremental doses of medication as mentioned above to maintain modest sedation and analgesia.    Through the bite block, Olympus GIF-160 endoscope was inserted and then advanced under direct visualization to the level of descending duodenum. Endoscope was then slowly withdrawn. The descending duodenum mucosa appeared normal. Random biopsies are taken to rule out possibility of Celiac sprue. The pylorus and gastric antrum appeared  normal. Endoscope was retroflexed. Large sliding hiatal hernia was seen. Endoscope was withdrawn. Two linear clean-based ulcers were seen at the lower edge of the hiatal hernia consistent with [name] ulcers. Endoscope was further withdrawn and then endoscope was removed. The patient tolerated the procedure well. There were no immediate complications.    Dr. Leona Carry was present through the entire procedure.    ASSESSMENT:    This is a 69 year old woman with a history of anemia. EGD shows a large hiatal hernia with 2 Cameron-based ulcers possibly contributing to her chronic anemia.    RECOMMENDATION:    Colonoscopy as planned. PPI qd. Follow up with the random duodenal biopsy.   Addendum of Dr. Lacie Draft  See Dr. Richardean Sale note for details. I have seen and examined the patient for the management of GI disease. I agree with Dr. Richardean Sale findings and plans we developed together as written.          THIS WAS ELECTRONICALLY SIGNED - 05/08/2005 10:03 AM PST BY: Massie Kluver, MD  ASSOCIATE PROFESSOR  SURGEON    THIS WAS ELECTRONICALLY SIGNED - 05/05/2005 1:05 PM PST BY: Amanda Pea, MD  ASSISTANT SURGEON  DEPARTMENT OF INTERNAL MEDICINE      AV:WUJ([id number])    D: 05/04/2005 02:38 PM  T: 05/04/2005 05:34 PM  C#: [id number]

## 2005-05-11 ENCOUNTER — Encounter: Admitting: Gastroenterology

## 2005-05-12 ENCOUNTER — Telehealth: Payer: Self-pay | Admitting: Internal Medicine

## 2005-05-12 NOTE — Telephone Encounter (Signed)
Spoke with Stefano Gaul, everything taken care of.  [name] V MA

## 2005-05-12 NOTE — Telephone Encounter (Signed)
She had Two ulcers in her stomach that are probably a source of her bleeding and resultant anemia. [facility]

## 2005-05-12 NOTE — Telephone Encounter (Signed)
Pt is requesting results for Endoscopy that was performed on 05/04/05. Thanks

## 2005-05-12 NOTE — Telephone Encounter (Signed)
Pt called via relay health operator c/o weakness and SOB that she has had for months. Pt had colon yesterday and an endo last wk which should hiatal hernia and ulcer which is probable cause for anemia. Pt says she feels so weak what should she do. Pt denies f/c or n/v. I advised pt to increase her fluids, gatorade, juice, etc. If pt is not any better needs eval at nearest ER for possible IV hydration.Patient verbalized understanding.Beverly Sessions, RN

## 2005-05-16 ENCOUNTER — Telehealth: Payer: Self-pay | Admitting: Internal Medicine

## 2005-05-16 NOTE — Telephone Encounter (Signed)
Gave MD's message to pt.

## 2005-05-16 NOTE — Telephone Encounter (Signed)
She is calling for the results of endoscopy/colonoscopy done at [facility] 05/04/05, and 05/11/05.   She would like a phone call back.    Pt also would like to know, what does spot on the liver mean? Pt says this was mentioned to her husband after one of the above procedures.

## 2005-05-16 NOTE — Telephone Encounter (Signed)
She needs to contact GI dept for test results. [facility]

## 2005-05-17 ENCOUNTER — Encounter

## 2005-05-17 NOTE — Procedures (Addendum)
PATIENT: Melinda [name], [name]   MR #: [id number] SEX: F AGE: 69  OPERATION DATE: 05/11/2005 DOB: 27-Oct-1935    INPATIENT OPERATION RECORD    PREOPERATIVE DIAGNOSIS:    Anemia.    POSTOPERATIVE DIAGNOSIS:    Diverticula in the sigmoid colon.    PROCEDURE:    Colonoscopy.    Consent was obtained by the patient after discussing with patient regarding risks and benefits of procedure which include but are not limited to perforation, infection, bleeding, medication reaction. Patient signed the consent via sign language interpreter and it was placed in the chart.    For HP, please refer to last week's EGD dictation. Briefly, this is a 69 year old woman with anemia. EGD findings are significant for a large hiatal hernia with [name] ulcers. Is here for screening colonoscopy to complete her anemia workup.    PHYSICAL EXAM:    The patient is in no apparent distress. Lungs clear. Heart regular rhythm and rate. Abdomen soft, nondistended, nontender to palpation.    MEDICATIONS:    Demerol 125 mg, 5 mg Versed.    PROCEDURE:    Procedure was performed at the main endoscopy unit at the [facility] by Dr. Leona Carry assisted by Dr. Amanda Pea. Patient was placed in the left lateral decubitus position. Vital signs were monitored throughout the procedure, which remained stable. Patient was given incremental doses of medications mentioned above to maintain moderate sedation and analgesia. Rectal exam was performed, which was unremarkable. Olympus GIF-160 endoscope was inserted and advanced under direct visualization to the level of cecum. Endoscope was slowly withdrawn. Overall bowel prep was good. In the area of the sigmoid, we saw diverticula; otherwise colonic examination was unremarkable. Retroflexion was performed at the rectum, which was unremarkable. Endoscope was straightened. Gas was suctioned and endoscope was removed from the patient. The patient tolerated the procedure well. There were no immediate  complications. Dr. Leona Carry was present for the entire procedure.    ASSESSMENT:    The most likely diagnosis for her chronic anemia is the [facility] ulcer found on previous EGD, although intermittent bleed from diverticula can cause it as well. . Please see our recommendations on last week. Addendum of Dr. Lacie Draft  See Dr. Richardean Sale note for details. I have seen and examined the patient for the management of GI disease. I agree with Dr. Richardean Sale findings and plans we developed together as written.            THIS WAS ELECTRONICALLY SIGNED - 05/17/2005 2:53 PM PST BY: Massie Kluver, MD  SURGEON  ASSOCIATE PROFESSOR      THIS WAS ELECTRONICALLY SIGNED - 05/15/2005 1:37 PM PST BY: Amanda Pea, MD  ASSISTANT SURGEON  DEPARTMENT OF INTERNAL MEDICINE      AV:WUJ([id number])    D: 05/11/2005 04:43 PM  T: 05/11/2005 05:39 PM  C#: [id number]

## 2005-05-21 ENCOUNTER — Ambulatory Visit

## 2005-05-21 ENCOUNTER — Telehealth: Payer: Self-pay | Admitting: Internal Medicine

## 2005-05-21 ENCOUNTER — Encounter

## 2005-05-21 NOTE — Telephone Encounter (Addendum)
Patient states unable to sleep for the past 2 days because of ulcer pain. Patient states she has been taking Nexium and iron tablets that were prescribed. Patient states she has also had a twisted back and has tried to use ointments. Advised to go into [place]. Kyla Balzarine will fax her an appointment time.

## 2005-05-21 NOTE — Telephone Encounter (Signed)
Patient called in concerned with pain from ulcers. Left message on answering machine through hearing impaired operator services for patient to return call.

## 2005-05-23 NOTE — Telephone Encounter (Signed)
Please close encounter, if completed.

## 2005-06-16 ENCOUNTER — Ambulatory Visit

## 2005-06-16 ENCOUNTER — Other Ambulatory Visit: Payer: Self-pay | Admitting: Internal Medicine

## 2005-06-16 MED ORDER — AMBIEN 10 MG TABLET
ORAL_TABLET | ORAL | Status: DC
Start: 2005-06-16 — End: 2005-07-11

## 2005-06-16 NOTE — Telephone Encounter (Signed)
faxed from pharmacy  last refilled:05/17/2005  Melinda [name] V. Sanai Frick

## 2005-06-20 ENCOUNTER — Encounter

## 2005-06-20 ENCOUNTER — Telehealth: Payer: Self-pay | Admitting: Internal Medicine

## 2005-06-20 NOTE — Telephone Encounter (Signed)
CBC ordered, to front desk, please inform patient. Melinda [name].

## 2005-06-20 NOTE — Telephone Encounter (Signed)
Patient walked in and is requesting lab slip for her anemia. She states that she should have an order for blood work. Patient advised she has no orders in system. Patient advised that it could take a day or so for the orders to be made if the doctor feels they are needed. She will use T.D.D. To call to see if they are in system before she comes in.  Melinda [name]

## 2005-06-20 NOTE — Telephone Encounter (Signed)
Left message with relay to notify patient CBC ordered.  [name] V MA

## 2005-06-23 ENCOUNTER — Encounter: Payer: Self-pay | Admitting: Internal Medicine

## 2005-06-26 ENCOUNTER — Ambulatory Visit: Admitting: Internal Medicine

## 2005-06-26 ENCOUNTER — Ambulatory Visit

## 2005-06-26 ENCOUNTER — Encounter: Payer: Self-pay | Admitting: Internal Medicine

## 2005-06-26 VITALS — BP 132/88 | HR 98 | Temp 97.0°F | Resp 18 | Wt 196.3 lb

## 2005-06-26 NOTE — Nursing Note (Signed)
>>   Melinda [name]     06/26/2005   2:23 pm  The patient completed the flu questionnaire and signed the consent. The Inactivated Influenza Vaccine VIS document was given to the patient to review and any questions they had were answered. The consent was then reviewed for any Yes answers.  All questions were answered as No. The Influenza Vaccine was then administered per protocol. The patient was observed for immediate reactions to the vaccine per protocol. None were observed.  Mouang [name]. [name] MA    >> NGUYEN, HUYEN T     06/26/2005   2:04 pm  Vitals taken, allergies reviewed, pain score evaluated.   [name] MA

## 2005-06-26 NOTE — Progress Notes (Signed)
Patient presents with:    Health Maintenance - 2 month f/u      Subjective: Melinda [name] is a(n) 69yr old female who presents for follow up of hypertension.    Current symptoms are none. She has been treated with meds for 4 month(s), and states she takes medications regularly. Side effects noted are none; which are well tolerated.     Patient has not been checking ambulatory blood pressure.    H/o osteoporosis, on Evista    ROS:    Constitutional: negative.  Eyes: negative.  Ears, Nose, Mouth, Throat: negative.  CV: negative.  Resp: negative.  GI: negative.  GU: negative.  Musculoskeletal: negative.      History:  I did review patient's past medical and family/social history, no changes noted.    Objective:     General Appearance: skin warm, dry, and pink, cooperative.  Eyes: negative.  Neck: Neck supple. No adenopathy, thyroid symmetric, normal size.  Heart: normal rate and regular rhythm, no murmurs, clicks, or gallops.  Lungs: clear to auscultation.  Extremities: no cyanosis, clubbing, or edema.  Skin: negative.  Mental Status: Appearance/Cooperation: in no apparent distress, alert, afebrile, cooperative, well hydrated and deaf, sign interpretor present      Assessment: Essential hypertension adequately controlled.  See Diagnoses Section.    Plan: See Orders.  1) Medication: continue current medication regimen unchanged  2) Dietary sodium restriction  3) Regular aerobic exercise    401.9 HYPERTENSION NOS (primary encounter diagnosis)  Note:   Plan: FLU VACCINE,23YRS AND OLDER, IM NO PRESERVAT       733.00 OSTEOPOROSIS NOS  Note: stable  Plan: continue current medications    389.9 HEARING LOSS NOS  Note:   Plan:     272.4 HYPERLIPIDEMIA NEC/NOS  Note: LDL good  Plan: continue current medications      Patient Instruction: Reviewed risks of hypertension and principles of treatment.   See Patient Education and Orders Sections.     Barriers to Learning assessed: none. Patient verbalizes understanding of teaching  and instructions.

## 2005-06-27 ENCOUNTER — Other Ambulatory Visit: Payer: Self-pay | Admitting: Gastroenterology

## 2005-06-27 ENCOUNTER — Encounter: Admitting: Gastroenterology

## 2005-07-03 NOTE — Progress Notes (Addendum)
June 27, 2005 RE: Melinda, [name]    MR#: [id number]   DOB: May 03, 1936   Date of Service: 06/27/2005       Melinda Barter, MD  PCN [place]    Dear Melinda [name]:     We saw Melinda [name] in the [facility] today in followup for her anemia.    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 There was no actual bleeding at the time we performed the EGD. In addition, she underwent a colonoscopy and the only findings were some diverticulosis throughout the sigmoid and distal descending colon, and internal hemorrhoids; no neoplasms were found. She has been on iron, 3 tablets a day, and was prescribed Nexium 40 mg on a daily basis for the esophagitis, Cameron ulcers and hiatal hernia.    On physical exam today, her abdomen is obese. There is an umbilical hernia. There is just mild tenderness in the left lower quadrant and in the subxiphoid area. No masses were noted. Liver and spleen were not felt.    We have continued the medications outlined above plus Metamucil, 2 tablets at bedtime and 1 in the morning, and at the present time she appears to be without any GI symptomatology. We have repeated her CBC and comprehensive chemical panel. She continues on iron, 3 tablets a day.    We will follow Melinda [name] on a p.r.n. basis.          Melinda Kluver, MD  ASSOCIATE PROFESSOR  DIVISION OF GASTROENTEROLOGY  DEPARTMENT OF INTERNAL MEDICINE  THIS WAS ELECTRONICALLY SIGNED - 07/03/2005 10:03 AM PST BY:                   AVW:UJ([id number])    D: 06/27/2005 01:42 PM  T: 06/28/2005 06:30 AM  C#: [id number]

## 2005-07-11 ENCOUNTER — Other Ambulatory Visit: Payer: Self-pay | Admitting: Internal Medicine

## 2005-07-11 MED ORDER — AMBIEN 10 MG TABLET
ORAL_TABLET | ORAL | Status: DC
Start: 2005-07-11 — End: 2005-07-17

## 2005-07-11 NOTE — Telephone Encounter (Signed)
faxed from pharmacy  last refilled:06/16/2005  Khoa Opdahl V. Larena Glassman

## 2005-07-14 ENCOUNTER — Other Ambulatory Visit: Payer: Self-pay | Admitting: Internal Medicine

## 2005-07-14 MED ORDER — ALTACE 10 MG CAPSULE
ORAL_CAPSULE | ORAL | Status: DC
Start: 2005-07-14 — End: 2005-07-17

## 2005-07-14 NOTE — Telephone Encounter (Signed)
Refill request fax from pharmacy  

## 2005-07-17 ENCOUNTER — Other Ambulatory Visit: Payer: Self-pay | Admitting: Internal Medicine

## 2005-07-17 MED ORDER — ALTACE 10 MG CAPSULE
ORAL_CAPSULE | ORAL | Status: DC
Start: 2005-07-17 — End: 2006-01-03

## 2005-07-17 MED ORDER — AMBIEN 10 MG TABLET
ORAL_TABLET | ORAL | Status: DC
Start: 2005-07-17 — End: 2005-08-15

## 2005-07-20 ENCOUNTER — Telehealth: Payer: Self-pay | Admitting: Internal Medicine

## 2005-07-20 NOTE — Telephone Encounter (Signed)
Pa faxed. Ramesh Moan MA

## 2005-07-20 NOTE — Telephone Encounter (Signed)
Start PA for [employer]. [facility]

## 2005-07-20 NOTE — Telephone Encounter (Signed)
pharmacy fax request  Per pharmacy, ambien 10mg  is not covered. Pa is required. Please call [facility] at [phone] for pa. Do you want to start pa? Please advise. Thanks. [name] MA

## 2005-07-20 NOTE — Telephone Encounter (Signed)
Pa started for ambien 10mg . See pa letter and complete. Thanks. [name] MA

## 2005-07-20 NOTE — Telephone Encounter (Signed)
PA letter done, pls fax. [facility]

## 2005-07-24 ENCOUNTER — Ambulatory Visit

## 2005-07-24 ENCOUNTER — Telehealth: Payer: Self-pay | Admitting: Internal Medicine

## 2005-07-24 NOTE — Telephone Encounter (Signed)
Patient dx LLE DVT 07/20/05 in [place], admit x 3 days, rx coumadin 5 once a day , Lovenox once a day , needs labs today.    Labs ordered. Referred to coumadin clinic.    She needs to get records from [facility].

## 2005-07-25 NOTE — Telephone Encounter (Signed)
Firm given to pt to sign for rcds.

## 2005-07-27 ENCOUNTER — Ambulatory Visit

## 2005-07-27 NOTE — Telephone Encounter (Signed)
[employer] has approved Ambien 10 mg 90/90 days from 06/14/05 through 06/14/06.Pharmacy informed. [name] MA

## 2005-07-28 ENCOUNTER — Encounter

## 2005-07-28 ENCOUNTER — Telehealth: Payer: Self-pay | Admitting: Internal Medicine

## 2005-07-28 NOTE — Telephone Encounter (Signed)
INR 3.18, high, hold coumadin today, then take coumadin 2.5 mg, Sat, tues, 5 mg other days. Repeat INR one week. [facility]

## 2005-07-28 NOTE — Telephone Encounter (Signed)
Called pt. Number still buys. Called pt through relay service [phone].

## 2005-07-28 NOTE — Telephone Encounter (Signed)
Called pt. Number busy. Jaequan Propes MA

## 2005-07-31 NOTE — Telephone Encounter (Signed)
Called pt through relay service at [phone]. Pt number is busy. [name] MA

## 2005-08-01 ENCOUNTER — Encounter

## 2005-08-01 NOTE — Telephone Encounter (Signed)
Letter sent to patient instructing her to hold coumadin and come in for repeat INR now. [facility]

## 2005-08-01 NOTE — Telephone Encounter (Signed)
Called pt. Number is still busy. [name] MA

## 2005-08-03 ENCOUNTER — Encounter

## 2005-08-03 ENCOUNTER — Telehealth: Payer: Self-pay | Admitting: Internal Medicine

## 2005-08-03 NOTE — Telephone Encounter (Signed)
Pt called through [facility] and would like to let you know that she was seen at the [facility] Clinic last week and again on Tuesday 08/01/05. Pt states her INR was 4.1 which is high and the [facility] told her not to take any more Coumadin pills Yesterday, and 2 1/2 pills today, 5 ml Fri, 5 ml Sat, 2 1/2 mg Sunday, 5 ml Mon, 5 ml Tues. Pt says her next appt with him is next week on Tuesday 08/08/05 @ 1:30pm. Pt states you can call Barbara Cower to discuss her Blood test and the amount of Coumadin. Pt says she feels fine so far. Thanks    Barbara Cower  Phone#[phone]  [phone]

## 2005-08-03 NOTE — Telephone Encounter (Signed)
Noted. Thanks, [facility]

## 2005-08-10 ENCOUNTER — Encounter

## 2005-08-10 ENCOUNTER — Telehealth: Payer: Self-pay | Admitting: Internal Medicine

## 2005-08-10 NOTE — Telephone Encounter (Signed)
Per pt's relay call-she will be going to do her weekly blood test and would like to p/u labslip from front desk. Per pt has not taken coumadin for about 6 days after receiving md's letter. Pls order labs so she can check her levels. When should she have f/u?

## 2005-08-10 NOTE — Telephone Encounter (Signed)
Patient is being followed in coumadin clinic, she should take coumadin as directed by coumadin clinic. The letter she rec'd was written prior to her being seen in coumadin clinic. [facility]

## 2005-08-10 NOTE — Telephone Encounter (Signed)
Left message for patient to call back.  Carrol Hougland V MA

## 2005-08-15 ENCOUNTER — Telehealth: Payer: Self-pay | Admitting: Internal Medicine

## 2005-08-15 ENCOUNTER — Ambulatory Visit

## 2005-08-15 ENCOUNTER — Encounter

## 2005-08-15 MED ORDER — AMBIEN CR 6.25 MG TABLET,EXTENDED RELEASE
EXTENDED_RELEASE_TABLET | ORAL | Status: DC
Start: 2005-08-15 — End: 2006-03-28

## 2005-08-15 NOTE — Telephone Encounter (Signed)
Patient requesting Remus Loffler cr so she can sleep longer. She also wants all rx's to be sent to a new pharmacy. She wants to use [facility] in [place]. Patient also requesting the injection for shingles. I advised the injection would cost [phone] and the M.D. Would need to order it. Original note patient sent in will be sent to the back.   Melinda [name]

## 2005-08-15 NOTE — Telephone Encounter (Addendum)
Left message with Relay service to notify patient of message and to call back if any problems.  [name] V MA

## 2005-08-15 NOTE — Telephone Encounter (Signed)
Called pt through relay service at [phone]. left message for pt. to call back  [name] MA

## 2005-08-15 NOTE — Telephone Encounter (Signed)
Pt return our call through re-lay service. Notified of dr. Sandria Manly note.pt states wants dr. Gregary Cromer to send her coumadin results and lab results to dr. Lacie Draft after dr. Gregary Cromer looks at them. [name] MA

## 2005-08-15 NOTE — Telephone Encounter (Signed)
She should wait for the shingles injection because it may be covered later in the year. I called in Ambien CR 6.25 mg. She needs to try the lower dosage first, given her age.

## 2005-08-15 NOTE — Telephone Encounter (Signed)
Left msg via TDY operator on pt's answering machine that Dr. Lacie Draft has access to pt's results via EMR. If questions pls call [phone] or [phone]  Beverly Sessions, RN

## 2005-08-15 NOTE — Telephone Encounter (Signed)
Dr. Michela Pitcher should have access to them on the EMR. Is there something that I'm not understanding here? Please triage. Thank you.

## 2005-08-16 ENCOUNTER — Ambulatory Visit

## 2005-08-22 ENCOUNTER — Other Ambulatory Visit: Payer: Self-pay | Admitting: Internal Medicine

## 2005-08-22 ENCOUNTER — Inpatient Hospital Stay: Admission: EM | Admit: 2005-08-22 | Attending: Internal Medicine | Admitting: Internal Medicine

## 2005-08-22 ENCOUNTER — Encounter

## 2005-08-22 ENCOUNTER — Encounter: Admitting: Internal Medicine

## 2005-08-22 MED ORDER — AMBIEN 10 MG TABLET
ORAL_TABLET | ORAL | Status: DC
Start: 2005-08-22 — End: 2005-12-15

## 2005-08-22 NOTE — Telephone Encounter (Signed)
pharmacy fax request  Per pharmacy, pt picked up ambien cr 6.25mg  on 08/20/05.pt says it does not work and w ants to go back to [facility]. Please advise. Thanks. [name] MA

## 2005-08-22 NOTE — Progress Notes (Signed)
PATIENT: Melinda [name], Melinda [name] LOCATION:   MR #: [id number] SEX: F AGE: 70  DATE OF SERVICE: 08/22/2005 DOB: 10-15-1935      EMERGENCY DEPARTMENT NOTE    LINKING LANGUAGE:    I have seen and examined this patient in conjunction with Dr. Jerl Santos, resident. We have discussed history and exam findings, and developed evaluation and management plan.    HISTORY OF PRESENT ILLNESS:    This 70 year old female who is deaf is providing [place] history through writing and an interpreter. She presents because of redness and swelling of the left lower extremity, which has become particularly notable over the last 2 days. Patient has a history of diagnosis of left lower extremity DVT made on July 20, 2005, in [place]. Patient has had treatment with Coumadin and Lovenox with Coumadin at times being held because of supratherapeutic INR levels. At this time apparently the patient has been off of Lovenox for a week and has continued on Coumadin. She was referred from Coumadin clinic today. Patient has not had chest pain or shortness of breath. She had not had fevers or chills. She is tolerating p.o. satisfactorily.    PAST HISTORY:    DRUG ALLERGIES: SULFA AND NORVASC. MEDICATIONS: Ambien, Evista, Altace, Toprol, lovastatin, Sudafed, Coumadin. MEDICAL PAST HISTORY: GERD, hiatal hernia with ulcer, anemia, umbilical hernia, hyperlipidemia, osteoporosis, hypertension, DVT. SURGICAL PAST HISTORY: Goiter, hernia.    FAMILY HISTORY:  \  Noncontributory.    SOCIAL HISTORY:    Patient does not use alcohol or tobacco or illicit drugs.    TEN-ORGAN SYSTEM REVIEW:    Negative except as noted in the history of the present illness.    PHYSICAL EXAMINATION:    Patient is alert, cooperative. Vital signs: Blood pressure 121/70, pulse 68, respirations 18, temperature 36.7. Skin is generally unremarkable. HEENT: Exam is negative for acute findings. Lungs: Clear, equal breath sounds. Heart: Regular S1/S2. Abdomen wall shows ecchymoses consistent with  Lovenox injections recently. Abdomen is soft and nontender. Extremities exam shows moderate to marked swelling of the left lower extremity. The skin is erythematous and warm. Pedal pulses are 2+. Toes are somewhat purplish in appearance. Right side is generally unremarkable. Neurologic exam: Patient has clear mentation and no focal findings.    ED STUDIES:    CBC shows white blood cell count of 3.5, with hematocrit of 32.5, and platelets of 257. INR is 3.43 and PTT is 36. Chemistry-7 panel is generally unremarkable. Liver function tests are normal. Troponin is 0.01. Urinalysis is negative by dip and microscopic not done. EKG shows sinus bradycardia at 59 and is otherwise a normal tracing. Ultrasound of the lower extremities shows no evidence of DVT on the right, but a 3.9 x 0.4-cm filling defect within the left mid superficial femoral vein consistent with DVT.    IMPRESSION:    1. Left lower extremity DVT.  2. Possible cellulitis, left lower extremity.  3. Deafness.  4. History of ulcer and anemia.    ED CARE AND DISPOSITION:    In the emergency department, the patient was stable. We attempted to obtain records from [facility] but did not receive any information. Plan is to refer this patient to the internal medicine service for treatment of DVT and antibiotic coverage for possible cellulitis. She received a dose of clindamycin while here.      THIS WAS ELECTRONICALLY SIGNED - 08/25/2005 9:50 AM PST BY: Inda Castle, MD  CLINICAL [facility]  EMERGENCY MEDICINE DEPARTMENT  AVW:[id number]    D: 08/22/2005 10:21 PM  T: 08/22/2005 10:29 PM  C#: [id number]

## 2005-08-22 NOTE — Telephone Encounter (Signed)
Rx ambien 10 mg.  JH

## 2005-08-22 NOTE — Telephone Encounter (Signed)
Called pt through re-lay service. Left mess for pt regarding rx pt was asking for has been faxed to pharmacy. [name] MA

## 2005-08-23 DIAGNOSIS — I82890 Acute embolism and thrombosis of other specified veins: Secondary | ICD-10-CM | POA: Insufficient documentation

## 2005-08-23 NOTE — H&P (Signed)
PATIENT: Melinda [name], FORSTER LOCATION:   MR #: [id number] SEX: F AGE: 70  ADMISSION DATE: 08/22/2005 DOB: 1935-09-08    INPATIENT HISTORY AND PHYSICAL    CHIEF COMPLAINT:    Right lower leg swelling, redness, warmth for 2 days.    HISTORY OF PRESENT ILLNESS:    The patient is a 70 year old female with past medical history of deafness, hypertension, hyperlipidemia, duodenal ulcer, diverticulosis, history of bleeding, chronic anemia, osteoporosis, left lower extremity DVT was diagnosed one month ago. Current anticoagulation therapy was started two days ago. The patient's left lower leg started getting swelling, tender, with redness and warmth but patient denied any fever or chills. Presented to Coumadin clinic today. INR was 3.43. Patient has been finished Lovenox therapy of 1 week ago. Currently is getting Coumadin treatment. Patient denied any chest pain or shortness of breath, was admitted to the hospital for further treatment.    REVIEW OF SYSTEMS:    Patient is a 71 year old female, deafness, presents to the hospital with left lower extremity increased redness and warmth. No history of stroke. No thyroid disease. Hyperlipidemia but stopped taking lovastatin about one month ago. Her primary doctor thinks she will not need that. Had a history of duodenal ulcer and also diverticulosis, bleeding before, but currently no diarrhea or black stools. No dysuria, urinary frequency. Has high blood pressure, is taking medication. No history of stroke. A 14-point system review same as previous history and physical.    PAST MEDICAL HISTORY:    Patient has hypertension, hyperlipidemia, osteoporosis, left lower extremity DVT, umbilical hernia, diverticulosis, duodenal ulcer, anemia. ALLERGIC to SULFA and NOVOCAIN. MEDICATIONS she is currently taking include Altace 10 mg once a day, Toprol-XL 50 mg once a day, Ambien 6.25 mg q.h.s., Coumadin 5 mg and 2.5 mg alternate, iron sulfate 325 mg twice a day, Nexium 40 mg once a day,  multivitamin one tablet p.o. once a day, Evista 60 mg once a day, Vicodine 5/500 one tablet p.o. q.6 hours p.r.n. for the pain, was taking lovastatin 20 mg once a day but stopped taking for one month. SURGERY HISTORY: Goiter surgery at age 51, hernia repair surgery at age 27. Xanthoma tumor removed at age 7.    FAMILY HISTORY:    Mother had CVA. Father and brother had MI. Mother's side grandmother has diabetes.    PERSONAL HISTORY:    Patient currently lives with her husband. Denies smoking, alcohol, or illegal drug use.    PHYSICAL EXAMINATION:    The patient is alert, oriented x3.    VITAL SIGNS: Blood pressure 121/70, pulse 58, respiratory rate 18, temperature 36.7, deafness, able to communicate with writing screen. No ulcer.    HEENT: Bilateral pupil reaction to the light. Normal eye movement.    NECK: Supple. No JVD. No lymphadenopathy. No thyroid mass palpable.    CV: S1 and S2 present. No murmur or gallop.    LUNGS: Bilateral air entry. No rales or wheezing.    ABDOMEN: Soft and obese. No tenderness. Bowel sounds present.    EXTREMITIES: Left lower extremity local redness, warmth, and swelling, tender.    SKIN: Erythema, +2 pitting edema. Bilateral foot pulse is present by Doppler which was done in ER.    RECTAL: Deferred to PMD.    NEURO: Grossly nonfocal.    LAB TESTS TODAY:    Sodium 135, potassium 3.9, chloride 103, bicarbonate 27, BUN 19, creatinine 0.8, glucose 83. LFTs normal. PTT is 36, INR is 3.43. WBC 3.5, hemoglobin 10.8, hematocrit  32.5, platelets 257. Urinalysis is negative. Left lower extremity venous Doppler still shows blood clot with DVT positive.    ASSESSMENT AND TREATMENT PLAN:    The patient is a 70 year old female with past medical history of deafness, hypertension, duodenal ulcer, diverticulosis, left lower extremity DVT, was on treatment for 1 month, presented to the hospital with increased left lower extremity redness, warmth, and pain.    1. Left lower extremity DVT: Will continue  anticoagulation Coumadin at 3.5 mg and 2.5 mg alternate. Follow PT, INR daily.    2. Left lower extremity cellulitis: Will start patient on clindamycin at 600 mg q.8 hours. Keep left lower extremity elevated.    3. Hypertension: Continue her current medication with Toprol-XL and Altace.    4. History of hyperlipidemia but is not taking pill. We will check lipid profile. May need to restart again.    5. Regarding history of ulcer and anemia, patient will start Nexium 40 mg once a day for GI prophylaxis.    6. DVT prophylaxis: The patient is already on coumadin treatment.    CODE STATUS:    She is a FULL.    ACTIVITY:    As tolerated. Keep left leg elevated.    DIET:    Two gram sodium low fat diet.        THIS WAS ELECTRONICALLY SIGNED - 08/24/2005 5:02 AM PST BY: Gerry Heaphy Regino Schultze, MD  ATTENDING  DIVISION OF PULMONARY MEDICINE  DEPARTMENT OF INTERNAL MEDICINE      ZO:[id number]    D: 08/22/2005 10:47 PM  T: 08/22/2005 11:01 PM  C#: [id number]

## 2005-08-23 NOTE — Progress Notes (Signed)
PATIENT: Melinda [name], [place] [facility]  MR #: [id number] SEX: F AGE: 70  DATE OF SERVICE: 08/23/2005 DOB: [birth year]    INTERNAL MEDICINE INPATIENT PROGRESS NOTE    PREVIOUS 24-HOUR EVENTS: None.  SUBJECTIVE: Patient was seen in the presence of a sign language interpreter. I spent 30 minutes with the patient, explaining her current diagnosis, prognosis, and treatment plan, including pathophysiology of deep vein thrombosis and cellulitis. At this time, patient is just anxious and wondering when she can go home. Her leg pain control is adequate. She had no fever or chills. OBJECTIVE: Blood pressure is 115/48, respiratory rate of 18, saturating 98% on room air. Pulse of 62, temperature of 37o F. Her IOs are showing an intake of 600 mL, output of 700 mL. PHYSICAL EXAM: An elderly female lying down in bed. HEENT: Normocephalic. Neck is supple. Chest: Clear. Cardiovascular System: S1, S2 regular. Abdomen is soft. Extremities: Patient has swelling and redness in left lower extremity, more on the ankle area. Neurologically, she is nonfocal.  LAB DATA: The patient's potassium level is 4.3, creatinine of 0.9, troponin of 0.01. Her INR today is 2.78. White count of 4.2, hemoglobin of 11.3, platelets 271. Urinalysis is essentially negative.  Bilateral lower extremity Doppler studies showing findings consistent with deep vein thrombosis, mid superficial femoral vein on the left. ASSESSMENT AND PLAN: Patient is a very nice, 70 year old female with past medical history of hypertension, duodenal ulcer, diverticulosis, deafness, who was on a long trip to [place] and developed left lower extremity DVT. Was started on Coumadin therapy. Came in with left lower extremity cellulitis.  1. Left leg cellulitis. Patient is on IV antibiotics. I told the patient to keep the leg elevated.  2. Lower extremity DVT. Today her INR is supratherapeutic. I will only give her 1 mg of Coumadin today.  3. Hypertension. Patient is on Altace and  Toprol XL. 4. Hyperlipidemia. Lipid profile is pending.  5. GI prophylaxis. Patient is on Nexium.  6. DVT prophylaxis. Patient is on Coumadin.  he has not had a bowel movement. Otherwise, we will check the stool for C. diff, too.  7. Code status. FULL.  8. Dispo. Nursing home once cellulitis has improved.          THIS WAS ELECTRONICALLY SIGNED - 09/01/2005 7:34 PM PST BY: Johna Roles, MD  ASSOCIATE PHYSICIAN DIPLOMATE  DIVISION OF PULMONARY MEDICINE  DEPARTMENT OF INTERNAL MEDICINE                  [id number]    D: 08/23/2005 04:11 PM  T: 08/23/2005 04:18 PM  C#: [id number]

## 2005-08-24 ENCOUNTER — Encounter

## 2005-08-24 NOTE — Discharge Summary (Signed)
PATIENT: Melinda [name], Melinda [name]  MR #: [id number] SEX: F AGE: 70   DOB: [birth year]  ADMISSION DATE: 08/22/2005 DISCHARGE DATE: 08/24/2005    INPATIENT DISCHARGE SUMMARY    ADMITTING DISCHARGE SERVICE: Inpatient Internal Medicine.    DISCHARGE ATTENDING PHYSICIAN: Johna Roles, MD.    DISCHARGE DIAGNOSES:    1. Left leg cellulitis.  2. Left lower extremity deep vein thrombosis with supertherapeutic INR.  3. Hypertension.  4. Hyperlipidemia.  5. History of anemia and gastric ulcer.  6. Osteoporosis.  7. Umbilical hernia, diverticulosis, duodenal ulcer. 8. Deafness.    PAST MEDICAL HISTORY:    Past Surgical History: 1) Hernia repair at age 45. 2) Goiter surgery at age 37.    DISCHARGE MEDICATIONS:    ALLERGIES: SULFA AND NOVOCAINE.    1. Altace 10 mg daily.  2. Toprol XL 50 mg daily.  3. Ambien 6.25 mg at bedtime PRN insomnia.  4. The patient was taking Coumadin 5.0 mg and 2.5 mg alternating at home. Since her INR is supratherapeutic she is advised not to take any Coumadin today and take 1 mg Coumadin tomorrow until Sunday and recheck her INR early next week. The patient was given an appointment in the Coumadin Clinic.  5. Iron Sulfate 325 mg b.i.d.  6. Nexium 40 mg daily.  7. Multivitamin one tablet daily.  8. Evista 60 mg daily.  9. Vicodin PRN as needed.  10. Lovastatin 20 mg daily today.  11. The patient will also be on clindamycin 450 mg PO 4 x a day for seven more days.    I discussed the regimen with the patient and her husband with the help of her sign language interpreter. I advised the patient's husband not to give the patient medications that are unlabeled and unmarked in her medication box.    CONSULTS: None.    PROCEDURES PERFORMED: None.    REASON FOR ADMISSION:    The patient is a very nice 70 year old female with history of deafness and multiple above stated medical problems. She was recently on a long trip to [place] and developed left lower extremity DVT and has been started on  anticoagulation therapy. The patient bumped her leg at home and came into the hospital complaining of swelling, tenderness and redness. She did not have any fevers or chills. She was found to have cellulitis of her left lower extremity. Also, Doppler ultrasound confirmed the DVT of the left leg. She was admitted to the Department of Medicine.    HOSPITAL COURSE:    1. Left lower extremity cellulitis: This improved with treatment. The patient was advised to keep her left leg elevated until the swelling resolves and follow up with her regular doctor next week and finish a course of antibiotics.    2. Left lower extremity DVT: Since her INR is up on the high side, I advised her to cut down on her Coumadin dose.    3. Hypertension: The patient will continue her Toprol XL and Altace.    COMPLICATIONS/ADVERSE DRUG EVENTS: None.    LABORATORY DATA:    Pertinent labs and studies: On the day of discharge, the patient's INR has come down to 2.48, it was 3.78 yesterday. Her potassium level is 4.3, BUN of 18, creatinine of 0.9, blood glucose of 85, white count of 4.2, hemoglobin of 11.3, platelet count of 271. Blood cholesterol of 214, triglyceride level of 186, LDL of 137, HDL cholesterol is 43, troponin level of 0.07, TSH level of 3.08.  Her diagnostic imaging: The patient had an ultrasound of the lower extremity done, which showed findings consistent with deep vein thrombosis, mid superficial femoral vein on the left side. No evidence of DVT on the right lower extremity.    BRIEF DISCHARGE EXAM:    The patient's blood pressure is 132/45, pulse of 71, temperature of 37.4, respiratory rate of 18, saturating 100% on room air. Total intake was 1.9 liters/output of 1500 ml. HEENT: PERRLA. Neck is supple. Chest clear to auscultation. Cardiovascular systems, S1, S2 regular. Abdomen is soft. Extremities: The patient has minimal redness on the left ankle, more swelling on the left side compared to the right, but it has resolved.  Neurologically she is nonfocal.    DISCHARGE DISPOSITION: Home.    DISCHARGE INSTRUCTIONS:    1. The patient will be on low salt, low cholesterol, diet. 2. Activity - advised to keep her left leg elevated until the swelling resolves.  3. Advised to take her medications regularly, especially, the fact that the patient recently stopped her cholesterol pill.    FOLLOW UP:    Follow up is with Dr. Judith Blonder next week. I spoke with Dr. Gregary Cromer on the phone today and explained the current treatment plan.    PENDING STUDIES AT DISCHARGE: None.    During my hospital visit, I had detailed discussion with the patient and her husband, through the sign language interpreter; at the time of discharge, PT and INR was not back. Since the PT and INR is more in the therapeutic range now at 2.48, I will call the patient at home and advise her to take Coumadin today and recheck her INR with the primary care physician.              THIS WAS ELECTRONICALLY SIGNED - 08/28/2005 4:59 PM PST BY: Johna Roles, MD  ASSOCIATE PHYSICIAN DIPLOMATE  DIVISION OF PULMONARY MEDICINE  DEPARTMENT OF INTERNAL MEDICINE      ZO:[id number])    D: 08/24/2005  T: 08/24/2005 05:22 PM   C#: [id number]

## 2005-08-25 NOTE — Discharge Summary (Signed)
PATIENT: Melinda [name], [facility]inda [name]  MR #: [id number] SEX: F AGE: 70   DOB: June 19, 1936  ADMISSION DATE: 08/22/2005 DISCHARGE DATE: 08/25/2005    INPATIENT DISCHARGE SUMMARY    ADDENDUM TO DISCHARGE SUMMARY DICTATED 08/24/05    This morning I called the patient at home and left her a message to continue her home Coumadin dose of 5 mg alternating with 2.5 mg. Patient's INR yesterday was therapeutic at 2.48. This message was relayed via the relay services for the hearing impaired.              THIS WAS ELECTRONICALLY SIGNED - 08/28/2005 5:06 PM PST BY: Johna Roles, MD  ASSOCIATE PHYSICIAN DIPLOMATE  DIVISION OF PULMONARY MEDICINE  DEPARTMENT OF INTERNAL MEDICINE      WG:[id number])    D: 08/25/2005  T: 08/25/2005 11:33 AM   C#: [id number]

## 2005-08-29 ENCOUNTER — Encounter

## 2005-08-31 ENCOUNTER — Telehealth: Payer: Self-pay | Admitting: Internal Medicine

## 2005-08-31 NOTE — Telephone Encounter (Signed)
1. Patient's completed the course of antibiotics and her leg is better. She took: 3 clindamycin every 6hrs 150mg . Given to her by Dr. Thedore Mins at [facility]. Last week during her hospitalization for a blood clot in her leg.  2. She also wants to know if Lovastatin is OK to take with all of her medications. She's not taken it in quite some time, but noticed she's got some pills should she re start it?  3. Dr. Lacie Draft has prescribed Nexium, but she's not sure if it's going to be covered by her insurance. I did tell her he'd need to do the p.a. if it needs one.

## 2005-08-31 NOTE — Telephone Encounter (Signed)
Left message with relay services to notify patient about medication and to call back if any questions.  [name] V MA

## 2005-08-31 NOTE — Telephone Encounter (Signed)
Ok to take Lovastatin. [facility]

## 2005-09-04 ENCOUNTER — Telehealth: Payer: Self-pay | Admitting: Internal Medicine

## 2005-09-04 NOTE — Telephone Encounter (Addendum)
Called pt through relay service. Notified of dr. Sandria Manly message. [name] MA

## 2005-09-04 NOTE — Telephone Encounter (Signed)
Chem panel, CBC 08/23/05 are normal. JH  Allergies updated.

## 2005-09-04 NOTE — Telephone Encounter (Signed)
FYI  Pt states that she was prescribed the clindamycin antibiotic by Dr Thedore Mins, when she was hospitalized for bad infection in rt leg, and had an allergic reaction to the medication, and had to go to [facility] ER. Pt was told by the MD at [facility] [place] ER, never to take that medication again, and that her case was a mild one. Pt was prescribed Benadryl 25mg , and prednisone 20mg , 2 tabs daily. Pt stats that she is feeling much better.    Pt also requesting lab results, of test done on 08/23/05, please LVM.

## 2005-09-05 ENCOUNTER — Encounter

## 2005-09-06 ENCOUNTER — Telehealth: Payer: Self-pay | Admitting: Internal Medicine

## 2005-09-06 MED ORDER — NEXIUM 40 MG CAPSULE,DELAYED RELEASE
DELAYED_RELEASE_CAPSULE | ORAL | Status: DC
Start: 2005-09-06 — End: 2006-01-03

## 2005-09-06 NOTE — Telephone Encounter (Signed)
1. Rx nexium 40 mg once a day .    2. Rx written for compression stockings. [facility]

## 2005-09-06 NOTE — Telephone Encounter (Signed)
Spoke with Melinda [name] through [employer]. Melinda [name] states her Esophagus was hurting starting Yesterday, but she ate a Banana and took a Hotel manager.   Melinda [name] is requesting PA for RX Nexium to be faxed to Pharmacy for her Ulcers.  Melinda [name] states both of her feet are swollen and Melinda [name] is requesting a support Hose. Melinda [name] seems to think it's from poor circulation. Melinda [name] states one of her friend have a Support Hose for blood clots and it's seems to help him.  Also, Melinda [name] stated that she still has a rash all over her body from an Allergic reaction from an Antibiotic. Melinda [name] says other wise she is doing ok. Thank You

## 2005-09-07 ENCOUNTER — Telehealth: Payer: Self-pay | Admitting: Internal Medicine

## 2005-09-07 NOTE — Telephone Encounter (Signed)
Rx for compression stockings faxed to [facility] at [phone]. They will be contacting pt. [name] MA

## 2005-09-07 NOTE — Telephone Encounter (Signed)
Called pt. Left mess for pt to call back. Dedria Endres MA

## 2005-09-07 NOTE — Telephone Encounter (Signed)
Per pharmacy fax: Nexium 40 MG requires a PA. PA has been started. Please see letters. Danasha Melman MA

## 2005-09-08 ENCOUNTER — Telehealth: Payer: Self-pay | Admitting: Internal Medicine

## 2005-09-08 NOTE — Telephone Encounter (Signed)
Need a prescription for BP monitor. Patient would like order to be faxed to [facility] and see if insurance will cover for it.  [name] V MA

## 2005-09-08 NOTE — Telephone Encounter (Signed)
Get BP monitor for upper arm, not wrist or finger. Insurance does not cover this. [facility]

## 2005-09-08 NOTE — Telephone Encounter (Signed)
Patient is calling through relay services. She would like to know if you want her to have a BP monitor or a tester. Which kind and brand is the best? Please send orders over to [facility]. Maybe B/S will pay for it Patient will have to go over there to pick up some hoses on Tues, so she would like to p/u then. Thanks

## 2005-09-11 ENCOUNTER — Telehealth: Payer: Self-pay | Admitting: Internal Medicine

## 2005-09-11 NOTE — Telephone Encounter (Signed)
Patient just wanted to let you know that her right foot is swollen. She will wait and wear the hoses to see if that will help.

## 2005-09-11 NOTE — Telephone Encounter (Signed)
Rx was faxed today, please see previous encounter. [facility]

## 2005-09-11 NOTE — Telephone Encounter (Signed)
Noted. JH

## 2005-09-11 NOTE — Telephone Encounter (Signed)
Rx for BP monitor faxed to [facility] at [phone].  [name] V MA

## 2005-09-11 NOTE — Telephone Encounter (Signed)
OK, thanks Dr. She didn't even mention calling before. I've called the [employer] Relay service and left her a message on her machine as she'd requested.

## 2005-09-11 NOTE — Telephone Encounter (Signed)
Rx written, pls fax as requested. JH

## 2005-09-11 NOTE — Telephone Encounter (Signed)
Patient's calling to request a prescription be faxed to any dme supplier for a blood pressure monitor. She said her b/c-b/s ppo will cover it if you send a prescription.  If you OK for her please leave her a messag on her answering machine. Dial [phone] before her #.    Thanks.

## 2005-09-12 ENCOUNTER — Encounter

## 2005-09-12 ENCOUNTER — Encounter: Admitting: Internal Medicine

## 2005-09-12 ENCOUNTER — Ambulatory Visit: Admitting: Specialist

## 2005-09-12 NOTE — Nursing Note (Signed)
>>   [name], [name]     09/12/2005   3:10 pm  pt vitaled and screened for pain and allergy.  Clinton Quant MA

## 2005-09-15 NOTE — Progress Notes (Addendum)
PATIENT: Melinda [name], Melinda [name] LOCATION: [id number]   MR #: [id number] SEX: F AGE: 70   DATE OF SERVICE: 09/12/2005 DOB: 24-Oct-1935    [facility] NOTE    SUBJECTIVE:    This is a 70 year old female complaining of swelling in her left leg. History is difficult due to her hearing loss, and history is taken through an interpreter, who is signing. Also history is difficult because most of her records are not in the EMR. She was in fact hospitalized in [place], [place], for 3 days July 20, 2005, with swelling in the left leg, and found to have DVT in the left lower extremity. When she arrived here, this was confirmed January 9 with a venous study showing thrombus in the deep veins, specifically the left superficial femoral. She initially was on Lovenox and now she is maintained on Coumadin, variable amount. The last INR I have is January 11 and shows 2.48. She had another INR drawn today and she has received instructions from the coag clinic already. Her swelling is variable depending on her activity. There is no pain in the legs. She says that she was told to get some support stockings and she plans to get them tomorrow.    OBJECTIVE:     This is an overweight female. Weight 202. BP 118/78. Pulse 64. Exam of the lower extremities shows some edema of the left calf and the dorsum of the left foot with dependent rubor. Pedal pulses are diminished. She has no calf tenderness. Homans sign is negative.    ASSESSMENT:     DVT, left lower extremity.    PLAN:    She is advised on activity and to keep the leg elevated as much as practical and to get the support stockings as soon as possible. She will follow up with the anticoagulation clinic as well as her primary physician.         THIS WAS ELECTRONICALLY SIGNED - 09/15/2005 8:19 AM PST BY: Rosanne Gutting, MD  STAFF PHYSICIAN  [facility]  INTERNAL MEDICINE                HQI:[id number])    D: 09/12/2005 03:40 PM  T: 09/13/2005 10:27 AM  C#:  [id number]

## 2005-09-19 ENCOUNTER — Encounter

## 2005-09-19 ENCOUNTER — Ambulatory Visit

## 2005-09-20 ENCOUNTER — Telehealth: Payer: Self-pay | Admitting: Internal Medicine

## 2005-09-20 NOTE — Telephone Encounter (Signed)
PA done, pls fax. JH

## 2005-09-20 NOTE — Telephone Encounter (Signed)
FYI:  Pt calling from [facility]. Pt states [facility] in [place] is requesting PA for Rx Nexium through B/C B/S. Pt says her back seems to be getting worse than before after she was sick quit sometime. Pt would like to know if can prescribe a back brace or back support to help her back from getting worse. Pt would like to let you know her INR was very good today, it was 2.2 @ [facility]Co[facility] Clinic. Pt says Pharmacy and students were very happy for her. Thanks

## 2005-09-20 NOTE — Telephone Encounter (Signed)
Start PA for nexium. [facility]

## 2005-09-20 NOTE — Telephone Encounter (Signed)
Pa faxed. Luvada Salamone MA

## 2005-09-20 NOTE — Telephone Encounter (Signed)
Pa letter started, please fill it out.

## 2005-09-25 ENCOUNTER — Encounter: Admitting: Internal Medicine

## 2005-09-25 NOTE — Telephone Encounter (Signed)
Please Close Encounter if completed. Thanks

## 2005-09-26 ENCOUNTER — Encounter

## 2005-09-29 NOTE — Telephone Encounter (Signed)
Received fax from fep blue cross/blue shield regarding pa for nexium. Pa has been approved from 06/05/05 to 06/05/06. Pharmacy notified. [name] MA

## 2005-10-03 ENCOUNTER — Encounter

## 2005-10-05 ENCOUNTER — Ambulatory Visit

## 2005-10-06 ENCOUNTER — Encounter: Admitting: Internal Medicine

## 2005-10-08 ENCOUNTER — Telehealth: Payer: Self-pay | Admitting: Internal Medicine

## 2005-10-08 NOTE — Telephone Encounter (Signed)
Phone call from patient with assistance from [facility] for [employer]. Patient with left lower extremity DVT on Coumadin. Followed by Anticoagulation Clinic. Has questions concerning her Coumadin.  Patient had her INR checked last week Tuesday and had her Coumadin dose adjusted. She is concerned as she noted some blood on the toilet paper and blood tinged mucous from her nose earlier today but this has resolved.  Also has been taking Tylenol and occasional Ibuprofen for back pain related to her scoliosis. Is taking Nexium for PUD. Did note some swelling and rash yesterday of left lower extremity but it has improved today.  Recommended she hold her Coumadin tonight and call Anticoagulation Clinic this week to recheck her PTINR. Also to avoid taking Ibuprofen or other NSAIDs while on Coumadin. If she notes a recurrence of any bleeding and becomes symptomatic, she need to go to the nearest ER ASAP. If not, keep appointment with Dr. Gregary Cromer this week and with Anticoagulation Clinic. Also continue to keep left lower extremity elevated when she is able during the day.  All questions answered. Patient voices understanding.

## 2005-10-09 ENCOUNTER — Telehealth: Payer: Self-pay | Admitting: Internal Medicine

## 2005-10-09 NOTE — Telephone Encounter (Signed)
Phone call from patient with assistance from [facility] for [employer].  Had one more episode of blood on toilet paper, small amount per patient. No persistent bleeding or dripping. Had stopped Metamucil for 2 days and wanted to know if it was okay to restart this. She does admit to straining the other day, although no history of hemorrhoids.   Discussed with patient. Okay to restart Metamucil and monitor for anymore signs of recurrent or persistent bleeding. Should be seen sooner or more urgently if bleeding persists or recurs. All questions answered. Patient voices understanding.

## 2005-10-12 ENCOUNTER — Encounter

## 2005-10-13 ENCOUNTER — Ambulatory Visit: Admitting: Internal Medicine

## 2005-10-13 ENCOUNTER — Ambulatory Visit

## 2005-10-13 VITALS — BP 138/82 | HR 64 | Resp 16 | Wt 199.3 lb

## 2005-10-13 NOTE — Progress Notes (Signed)
Patient presents with:    Health Maintenance - follow up   Coumadin - for blood clot   Back Pain      Subjective: Melinda [name] is a(n) 71yr old female who presents for follow up of hypertension.    Current symptoms are none. She has been treated with meds for 4 month(s), and states she takes medications regularly. Side effects noted are none; which are well tolerated.     Patient has not been checking ambulatory blood pressure.    H/o DVT, on coumadin, doing well, swelling in left calf improved.    C/o low back pain, comes and goes, wants exercises for back.    ROS:    Constitutional: negative.  Eyes: negative.  Ears, Nose, Mouth, Throat: negative.  CV: negative.  Resp: negative.  GI: negative.  GU: negative.  Musculoskeletal: negative.      History:  I did review patient's past medical and family/social history, no changes noted.    Objective:     General Appearance: skin warm, dry, and pink, cooperative.  Neck: Neck supple. No adenopathy, thyroid symmetric, normal size.  Heart: normal rate and regular rhythm, no murmurs, clicks, or gallops.  Lungs: clear to auscultation.  Extremities: no cyanosis, clubbing, or edema.  Mental Status: Appearance/Cooperation: in no apparent distress, alert, well hydrated and deaf      Assessment: Essential hypertension adequately controlled.  See Diagnoses Section.    Plan: See Orders.  1) Medication: continue current medication regimen unchanged  2) Dietary sodium restriction  3) Regular aerobic exercise    401.9 HYPERTENSION NOS (primary encounter diagnosis)  Note:   Plan:     453.8 VENOUS THROMBOSIS NEC  Note: left  Plan: COUMADIN 5 MG TAB   Once a day     272.4 HYPERLIPIDEMIA NEC/NOS  Note: stable  Plan: continue current medications    733.00 OSTEOPOROSIS NOS  Note: stable  Plan: EVISTA 60 MG TAB   Once a day       Patient Instruction: Reviewed risks of hypertension and principles of treatment.   See Patient Education and Orders Sections.     Barriers to Learning assessed:  none. Patient verbalizes understanding of teaching and instructions.

## 2005-10-13 NOTE — Nursing Note (Signed)
>>   [name], Melinda L     10/13/2005   3:59 pm  Vital signs taken, allergies verified, screened for pain. Melinda Mosie Epstein, MA

## 2005-10-14 ENCOUNTER — Telehealth: Payer: Self-pay | Admitting: Internal Medicine

## 2005-10-14 NOTE — Telephone Encounter (Signed)
Relay call:    States that she has a sore neck and forgot to ask Dr Gregary Cromer if it is ok for her to use a topical cream for it. She is on coumadin and states that her body reacts differently to everything. She states that her hands got really red after washing dishes tonight.    Advised that it is ok to use the cream and stop if it irritates the skin.

## 2005-10-17 ENCOUNTER — Ambulatory Visit

## 2005-10-17 ENCOUNTER — Encounter

## 2005-10-17 ENCOUNTER — Telehealth: Payer: Self-pay | Admitting: Internal Medicine
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
## 2005-10-17 NOTE — Telephone Encounter (Signed)
Left response from Dr. Gregary Cromer at front desk for pt.Beverly Sessions, RN

## 2005-10-17 NOTE — Telephone Encounter (Signed)
Pt calling about compression stocking she got yesterday. Pt called on-call MD and was told to f/u with PCP after wearing stocking for a few hrs. Pt does not have stocking on today, she is not having any pain, numbness or discoloration. Pt says her legs are swollen but swelling is the same as it was prior to stocking. I did scheduled pt an appt for 3/8 for re-eval with stockings on. Pt wants to know if she should continue wearing stockings. Pt has anti coag appt today so she will stop by to get answer they do not have an answering machine. Pls advise.Beverly Sessions, RN

## 2005-10-17 NOTE — Telephone Encounter (Signed)
She can go ahead and wear stockings, if the stockings are uncomfortable then do not wear them. [facility]

## 2005-10-19 ENCOUNTER — Ambulatory Visit: Admitting: Internal Medicine

## 2005-10-19 NOTE — Nursing Note (Signed)
>>   [name], [name]     10/19/2005   1:54 pm  pt vitaled and screened for pain and allergy.  Clinton Quant MA

## 2005-10-19 NOTE — Progress Notes (Signed)
Patient presents with:    Leg Problem      Subjective:   Melinda [name] is a(n) 70yr old female who presents for a new problem of left leg pain.    She states that since onset 1 week(s) ago, the symptoms are complete resolution. Associated symptoms include pain over left distal tibia, anterior, when wears compression hose, pain goes away when takes hose off. She has had similar symptoms in the past, h/o left leg DVT, on coumadin.    ROS:    Constitutional: negative.  Eyes: negative.  Ears, Nose, Mouth, Throat: negative.  CV: negative.  Resp: negative.  GI: negative.  GU: negative.      History:  I did review patient's past medical and family/social history, no changes noted.    Objective:     General Appearance: skin warm, dry, and pink, cooperative.  Extremities: no cyanosis, clubbing, or edema and tender left distal tibia, anterior .  Skin: negative.  Mental Status: Appearance/Cooperation: in no apparent distress, alert, afebrile, cooperative and well hydrated      Assessment: left DVT, leg/tibia pain  See Diagnoses Section.    Plan: Order larger compression hose, size L4.  See Orders.    453.8 VENOUS THROMBOSIS NEC (primary encounter diagnosis)  Note:  Plan:     401.9 HYPERTENSION NOS  Note:   Plan: continue current medications      Patient Instruction:  See Patient Education section.     Barriers to Learning assessed: none. Patient verbalizes understanding of teaching and instructions.

## 2005-10-24 ENCOUNTER — Telehealth: Payer: Self-pay | Admitting: Internal Medicine

## 2005-10-24 NOTE — Telephone Encounter (Signed)
1. Ok to have skin lesion removed with liquid nitrogen.  2. Discuss dannon probiotic pills/drink with coumadin clinic. [facility]

## 2005-10-24 NOTE — Telephone Encounter (Signed)
Patient is calling to find out if it will be safe to have the brown spots and molds on face and neck removed by liquid nitro by the derm? Since she is taking the medication Warfarin.    Also will it be ok to take Dannon Probiotic drink to increase the good bacteria in her stomach?Also she was going to buy the pills of Dannon Probiotic that contained millions of bacteria, but she just wanted to make sure that there is no conflict with any of her med or her health. She would like to consult with you first. A message can be faxed to patient at [phone].

## 2005-10-25 NOTE — Telephone Encounter (Signed)
Unable to reach pt. Phone number listed is a fax number. [name] MA

## 2005-10-31 NOTE — Telephone Encounter (Signed)
Patient called back today and has been informed that it will be ok to use the liquid nitrogen.

## 2005-11-08 ENCOUNTER — Encounter

## 2005-12-06 ENCOUNTER — Encounter

## 2005-12-06 ENCOUNTER — Ambulatory Visit

## 2005-12-15 ENCOUNTER — Other Ambulatory Visit: Payer: Self-pay | Admitting: Internal Medicine

## 2005-12-15 MED ORDER — AMBIEN 10 MG TABLET
ORAL_TABLET | ORAL | Status: DC
Start: 2005-12-15 — End: 2006-01-03

## 2005-12-15 NOTE — Telephone Encounter (Signed)
pharmacy faxed request.last fill date [id number]

## 2005-12-25 ENCOUNTER — Ambulatory Visit

## 2006-01-03 ENCOUNTER — Encounter

## 2006-01-03 ENCOUNTER — Ambulatory Visit

## 2006-01-03 ENCOUNTER — Ambulatory Visit: Admitting: Internal Medicine

## 2006-01-03 VITALS — BP 136/78 | HR 68 | Temp 97.3°F | Resp 12 | Wt 200.0 lb

## 2006-01-03 MED ORDER — COUMADIN 5 MG TABLET
ORAL_TABLET | ORAL | Status: DC
Start: 2006-01-03 — End: 2006-03-28

## 2006-01-03 MED ORDER — TOPROL XL 100 MG TABLET,EXTENDED RELEASE
EXTENDED_RELEASE_TABLET | ORAL | Status: DC
Start: 2006-01-03 — End: 2006-03-28

## 2006-01-03 MED ORDER — NEXIUM 40 MG CAPSULE,DELAYED RELEASE
DELAYED_RELEASE_CAPSULE | ORAL | Status: DC
Start: 2006-01-03 — End: 2006-01-10

## 2006-01-03 MED ORDER — AMBIEN 10 MG TABLET
ORAL_TABLET | ORAL | Status: DC
Start: 2006-01-03 — End: 2007-05-15

## 2006-01-03 MED ORDER — ALTACE 10 MG CAPSULE
ORAL_CAPSULE | ORAL | Status: DC
Start: 2006-01-03 — End: 2006-03-28

## 2006-01-03 MED ORDER — EVISTA 60 MG TABLET
ORAL_TABLET | ORAL | Status: DC
Start: 2006-01-03 — End: 2006-03-28

## 2006-01-03 NOTE — Patient Instructions (Signed)
Please do fasting blood tests prior to next appointment.

## 2006-01-03 NOTE — Progress Notes (Signed)
Patient presents with:    Leg Problem - F/U    Form Request - check ins paperwork      Subjective: Melinda [name] is a(n) 70yr old female who presents for follow up of hypertension.    Current symptoms are none. She has been treated with meds for 4 month(s), and states she takes medications regularly. Side effects noted are none; which are well tolerated.     Patient has not been checking ambulatory blood pressure.    H/o DVT on coumadin, followed by coumadin clinic    She requests rx all meds for 90d mail order.    ROS:    Constitutional: negative.  Eyes: negative.  Ears, Nose, Mouth, Throat: negative.  CV: negative.  Resp: negative.  GI: negative.  GU: negative.  Musculoskeletal: negative.      History:  I did review patient's past medical and family/social history, no changes noted.    Objective:     General Appearance: skin warm, dry, and pink, cooperative.  Eyes: negative.  Neck: Neck supple. No adenopathy, thyroid symmetric, normal size.  Heart: normal rate and regular rhythm, no murmurs, clicks, or gallops.  Lungs: clear to auscultation.  Extremities: no cyanosis, clubbing, or edema.  Skin: negative.  Mental Status: Appearance/Cooperation: in no apparent distress, afebrile and well hydrated      Assessment: Essential hypertension adequately controlled.  See Diagnoses Section.    Plan: See Orders.  1) Medication: continue current medication regimen unchanged  2) Dietary sodium restriction  3) Regular aerobic exercise    401.9 HYPERTENSION NOS (primary encounter diagnosis)  Note:   Plan:     453.8 VENOUS THROMBOSIS NEC  Note: stable  Plan: continue current medications    272.4 HYPERLIPIDEMIA NEC/NOS  Note: off Lovastatin due to conflict with coumadin, she self dc'd.  Plan: LIPID PANEL WITH DLDL REFLEX, COMPREHENSIVE    METABOLIC PANEL, TSH WITH FREE T4 REFLEX       780.52 INSOMNIA UNSPECIFIED  Note:   Plan: AMBIEN 10 MG TAB   hs    733.00 OSTEOPOROSIS UNSPECIFIED  Note:   Plan: EVISTA 60 MG TAB   Once a day      453.8 EMBOLISM AND THROMBOSIS OF OTHER SPECIFIED VEINS  Note:   Plan: COUMADIN 5 MG TAB       530.81 ESOPHAGEAL REFLUX  Note:   Plan: NEXIUM 40 MG CAP   Once a day     401.9 UNSPECIFIED ESSENTIAL HYPERTENSION  Note:   Plan: ALTACE 10 MG CAP, TOPROL XL 100 MG 24 HR TAB         Patient Instruction: Reviewed risks of hypertension and principles of treatment.   See Patient Education and Orders Sections.     Barriers to Learning assessed: none. Patient verbalizes understanding of teaching and instructions.

## 2006-01-03 NOTE — Nursing Note (Signed)
>>   HARRIS, MARY E     01/03/2006   1:42 pm  Vital signs taken, allergies verified and screened for pain.  Heide Guile MA

## 2006-01-10 ENCOUNTER — Telehealth: Payer: Self-pay | Admitting: Internal Medicine

## 2006-01-10 MED ORDER — ACIPHEX 20 MG TABLET,DELAYED RELEASE
DELAYED_RELEASE_TABLET | ORAL | Status: DC
Start: 2006-01-10 — End: 2006-03-28

## 2006-01-10 NOTE — Telephone Encounter (Signed)
Pt is requesting a new Rx for protonix or aciphex. Pt says the nexium Rx is too expensive. Please inform pt when Rx has been faxed.

## 2006-01-10 NOTE — Telephone Encounter (Signed)
Received phone call back from pt through [facility]. Notified pt of dr. Sandria Manly message. [name] MA

## 2006-01-10 NOTE — Telephone Encounter (Signed)
Called pt through [facility]. left message for pt. to call back  [name] MA

## 2006-01-10 NOTE — Telephone Encounter (Signed)
Rx aciphex to replace nexium. [facility]

## 2006-01-15 ENCOUNTER — Encounter: Admitting: Internal Medicine

## 2006-02-07 ENCOUNTER — Encounter

## 2006-03-21 ENCOUNTER — Ambulatory Visit

## 2006-03-21 ENCOUNTER — Telehealth: Payer: Self-pay | Admitting: Internal Medicine

## 2006-03-21 NOTE — Telephone Encounter (Signed)
Left msg to c/b [phone] or [phone], thru [facility]. Yetta Barre Keatyn Luck, R.N.

## 2006-03-21 NOTE — Telephone Encounter (Signed)
Patient is calling through the [facility] system. She just got back from [place] and now has possible insect bites on her legs and breast. It is painful and itches. She is not sure if it dried blood or Tic bite's.  Pt does take Coumadin and Warfarin. She will be making an appt to be seen in the Anti Coag cln for a blood test.    Please advise pt.

## 2006-03-22 NOTE — Telephone Encounter (Signed)
Left msg to c/b [phone] or [phone] to sched appt.  Yaa Donnellan M Zanylah Hardie, R.N.

## 2006-03-26 ENCOUNTER — Encounter: Admitting: Internal Medicine

## 2006-03-28 ENCOUNTER — Ambulatory Visit: Admitting: Internal Medicine

## 2006-03-28 ENCOUNTER — Encounter

## 2006-03-28 VITALS — BP 120/78 | HR 80 | Temp 98.1°F | Resp 16 | Wt 202.5 lb

## 2006-03-28 MED ORDER — AMBIEN CR 6.25 MG TABLET,EXTENDED RELEASE
EXTENDED_RELEASE_TABLET | ORAL | Status: DC
Start: 2006-03-28 — End: 2006-08-29

## 2006-03-28 MED ORDER — COUMADIN 5 MG TABLET
ORAL_TABLET | ORAL | Status: DC
Start: 2006-03-28 — End: 2007-03-07

## 2006-03-28 MED ORDER — EVISTA 60 MG TABLET
ORAL_TABLET | ORAL | Status: DC
Start: 2006-03-28 — End: 2007-05-15

## 2006-03-28 MED ORDER — TOPROL XL 100 MG TABLET,EXTENDED RELEASE
EXTENDED_RELEASE_TABLET | ORAL | Status: DC
Start: 2006-03-28 — End: 2007-03-07

## 2006-03-28 MED ORDER — ACIPHEX 20 MG TABLET,DELAYED RELEASE
DELAYED_RELEASE_TABLET | ORAL | Status: DC
Start: 2006-03-28 — End: 2007-05-24

## 2006-03-28 MED ORDER — ALTACE 10 MG CAPSULE
ORAL_CAPSULE | ORAL | Status: DC
Start: 2006-03-28 — End: 2007-05-31

## 2006-03-28 NOTE — Telephone Encounter (Signed)
Patient has appt today, per EMR.[name], R.N.

## 2006-03-28 NOTE — Nursing Note (Signed)
>>   NGUYEN, HUYEN T     03/28/2006   4:41 pm  Vitals taken, allergies reviewed, pain score evaluated.   [name] MA

## 2006-03-29 NOTE — Progress Notes (Signed)
Patient presents with:    Insect Bites - one on left leg, one on right leg, one on left breast area x 1 week   Medication Follow Up - lovastatin?   Refill Request - new rx for mail order      Subjective:   Melinda [name] is a(n) 70yr old female who presents for a new problem of insect bites on legs, chest.    She states that since onset 1 week(s) ago, the symptoms are gradually improving. Associated symptoms include painful red bites on left chest, left thigh, right calf, no dc, pus. She has had similar symptoms in the past.    Hypertension: Patient has not been checking ambulatory blood pressure. She takes medications regularly. Current cardiovascular symptoms are none.     H/o DVT on coumadin.    Dyslipidemia: She has no regular exercise program and describes her diet as low fat/low cholesterol.   Last lipid panel shows high LDL and HDL at goal. She is taking no meds, diet control.  ROS:    Constitutional: negative.  Eyes: negative.  Ears, Nose, Mouth, Throat: negative.  CV: negative.  Resp: negative.  GI: negative.  GU: negative.  Musculoskeletal: negative.      History:  I did review patient's past medical and family/social history, no changes noted.    Objective:     General Appearance: skin warm, dry, and pink, cooperative.  Eyes: conjunctivae and corneas clear. PERRL, EOM's intact.  Neck: Neck supple. No adenopathy, thyroid symmetric, normal size.  Heart: normal rate and regular rhythm, no murmurs, clicks, or gallops.  Lungs: clear to auscultation.  Extremities: no cyanosis, clubbing, or edema.  Skin: 5 mm red papule left upper chest, no dc, pus, 8 mm red papule left distal thigh, right proximal calf, no dc pus.  Mental Status: Appearance/Cooperation: in no apparent distress, alert, afebrile, cooperative and well hydrated, hearing impaired, translator present      Assessment: insect bites, resolving, no infection  See Diagnoses Section.    Plan: Keep clean and dry  See Orders.    401.9 UNSPECIFIED  ESSENTIAL HYPERTENSION (primary encounter diagnosis)  Note: BP good  Plan: ALTACE 10 MG CAP, TOPROL XL 100 MG 24 HR TAB   Once a day     780.52 INSOMNIA UNSPECIFIED  Note:   Plan: AMBIEN CR 6.25 MG TAB   Qhs.    733.00 OSTEOPOROSIS UNSPECIFIED  Note:   Plan: EVISTA 60 MG TAB   Once a day     453.8 EMBOLISM AND THROMBOSIS OF OTHER SPECIFIED VEINS  Note:   Plan: COUMADIN 5 MG TAB   1/2 to 1 once a day , follow up coumadin clinic    530.81 ESOPHAGEAL REFLUX  Note:   Plan: ACIPHEX 20 MG TAB   Once a day     272.4 HYPERLIPIDEMIA NEC/NOS  Note:   Plan: LIPID PANEL WITH DLDL REFLEX, ALANINE    TRANSFERASE (ALT), ASPARTATE TRANSAMINASE (AST)         Patient Instruction:  See Patient Education section.     Barriers to Learning assessed: none. Patient verbalizes understanding of teaching and instructions.

## 2006-04-11 ENCOUNTER — Encounter

## 2006-04-12 ENCOUNTER — Encounter: Admitting: OBSTETRICS/GYN

## 2006-04-12 ENCOUNTER — Encounter

## 2006-04-12 ENCOUNTER — Encounter: Payer: Self-pay | Admitting: OBSTETRICS/GYN

## 2006-04-17 ENCOUNTER — Telehealth: Payer: Self-pay | Admitting: Internal Medicine

## 2006-04-17 NOTE — Telephone Encounter (Signed)
Pa started.  Please complete pa letter. Thanks. Antonin Meininger MA

## 2006-04-17 NOTE — Telephone Encounter (Signed)
Please start PA for ambien CR.  JH

## 2006-04-17 NOTE — Telephone Encounter (Signed)
Patient calling stated that she got a letter from [facility] stating that [facility] CR needs PA. Please process.

## 2006-04-18 NOTE — Telephone Encounter (Signed)
PA faxed.  Magaline Steinberg L MA

## 2006-04-18 NOTE — Telephone Encounter (Signed)
PA letter and fax PA form completed. [facility]

## 2006-04-19 ENCOUNTER — Ambulatory Visit

## 2006-04-24 ENCOUNTER — Encounter

## 2006-04-24 ENCOUNTER — Other Ambulatory Visit: Payer: Self-pay | Admitting: OBSTETRICS/GYN

## 2006-04-26 NOTE — Telephone Encounter (Addendum)
Received fax from [employer] cross blue shield fep program regarding pa for ambien 6.25mg  #90. Pa is approved from 04/09/06 to 04/10/07. Called pt through [facility]. Left mess for pt regarding pa approval. [name] MA

## 2006-04-27 ENCOUNTER — Encounter: Payer: Self-pay | Admitting: OBSTETRICS/GYN

## 2006-04-27 NOTE — Progress Notes (Signed)
I have written a prescription for you and sent it directly to your    pharmacy. Attached is a copy of the prescription that the pharmacy    received. Be sure to follow the directions and precautions on the    prescription. Click on a medication listed below to review the pharmacy    location where your prescription was sent.  If the pharmacy recipient is    a mail order pharmacy, you may wish to contact your mail order service    in 3-7 business days to verify shipment of your prescription. If the    pharmacy recipient is not a mail order pharmacy, please call the    pharmacy to make sure your prescription is ready before you pick it up.

## 2006-05-02 ENCOUNTER — Telehealth: Payer: Self-pay | Admitting: Internal Medicine

## 2006-05-02 ENCOUNTER — Encounter: Admitting: Internal Medicine

## 2006-05-02 MED ORDER — ZOCOR 20 MG TABLET
ORAL_TABLET | ORAL | Status: DC
Start: 2006-05-02 — End: 2007-05-28

## 2006-05-02 NOTE — Telephone Encounter (Signed)
Pt is calling through [facility], requesting a call back re: Lab results that was done on 04/30/06.    Also, Pt would like to know if she needs to take any iron pills/ Rx Levastatin or any other med before she departs to [place]?     FYI: Pt requested a Medical Release form to be mailed to her at the below address. DONE. Thanks     Address:   [address]  [place], [place] [address]

## 2006-05-02 NOTE — Telephone Encounter (Signed)
Cholesterol high 239, rx zocor 20 mg once a day, 90d rx written, please fax. [facility]

## 2006-05-03 NOTE — Telephone Encounter (Signed)
Called pt through [facility] service [phone]. Pt notified of dr. Sandria Manly message. Pt states she will continue to take lipitor until she receives zocor in the mail. Pt states she will be going to [place] for a couple of months and will see dr. Gregary Cromer when she comes back. Thanks. [name] MA

## 2006-05-03 NOTE — Telephone Encounter (Signed)
Rx faxed to [facility] and original mailed to patient.  Dammon Makarewicz L MA

## 2006-05-03 NOTE — Telephone Encounter (Signed)
Called relay service 711. Number busy. [name] MA

## 2006-05-03 NOTE — Telephone Encounter (Signed)
Pt calling back through [facility], requesting Rx Zocor to be sent to [facility] for a 90 day supply for mail order. Thanks    Pt would like to know does she need to be taking any iron pills?    Also, Pt would like to know why her cholesterol is high? Pt would like a call back with advice. Thank You

## 2006-05-03 NOTE — Telephone Encounter (Signed)
Patient does not need iron at this time.  Her cholesterol is high due to her liver producing too much cholesterol, probably a genetic problem. [facility]

## 2006-05-03 NOTE — Telephone Encounter (Signed)
Called pt through relay service. Left mess for pt to call us back. [name] MA

## 2006-05-09 ENCOUNTER — Encounter

## 2006-05-11 ENCOUNTER — Encounter

## 2006-05-18 ENCOUNTER — Encounter: Payer: Self-pay | Admitting: OBSTETRICS/GYN

## 2006-05-18 ENCOUNTER — Ambulatory Visit

## 2006-05-18 NOTE — Progress Notes (Signed)
I have written a prescription for you and sent it directly to your    pharmacy. Attached is a copy of the prescription that the pharmacy    received. Be sure to follow the directions and precautions on the    prescription. Click on a medication listed below to review the pharmacy    location where your prescription was sent.  If the pharmacy recipient is    a mail order pharmacy, you may wish to contact your mail order service    in 3-7 business days to verify shipment of your prescription. If the    pharmacy recipient is not a mail order pharmacy, please call the    pharmacy to make sure your prescription is ready before you pick it up.

## 2006-05-28 ENCOUNTER — Other Ambulatory Visit: Payer: Self-pay | Admitting: Internal Medicine

## 2006-05-28 NOTE — Telephone Encounter (Signed)
rx written. Please fax.  JH

## 2006-05-28 NOTE — Telephone Encounter (Signed)
Received fax from [facility] regarding Detrol LA 4mg . See counter for form.  Caoimhe Damron L MA

## 2006-05-29 ENCOUNTER — Encounter

## 2006-05-29 NOTE — Telephone Encounter (Signed)
Form faxed back.  Kailey Esquilin L MA

## 2006-06-08 ENCOUNTER — Emergency Department (HOSPITAL_COMMUNITY): Admission: EM | Admit: 2006-06-08 | Discharge: 2006-06-08 | Payer: Self-pay | Admitting: Emergency Medicine

## 2006-06-26 ENCOUNTER — Telehealth: Payer: Self-pay | Admitting: Internal Medicine

## 2006-06-26 MED ORDER — NEXIUM 40 MG CAPSULE,DELAYED RELEASE
DELAYED_RELEASE_CAPSULE | ORAL | Status: AC
Start: 2006-06-26 — End: 2006-07-03

## 2006-06-26 NOTE — Telephone Encounter (Signed)
Please fax the Nexium prescription to [facility] pharmacy, patient said she's living in [place] now and will be there for the next 6 months.     [facility] Id# [id number] (Patient didn't have the fax, phone)  or medication dosage. I've found a Nexium rx from May of last year for 40mg  daily.     If OK please fax for her, and if you need anything, or to reach her you can email patient at [email or web address], or speak to her husband Dennard Nip here at her regular home phone #.     She said she is going to contact the anticoag clinic to tell them about her fall down the stairs and bruising. The bruises are now better. She's staying with her

## 2006-06-26 NOTE — Telephone Encounter (Signed)
Pt states that the aciphex is not effective and is requesting a new RX for nexium, dose/instructions unknown, 30-day supply, faxed to   Call was disconnected.

## 2006-06-26 NOTE — Telephone Encounter (Signed)
Rx written, pls fax to [facility]. [facility]

## 2006-06-27 NOTE — Telephone Encounter (Signed)
Rx faxed to [facility]

## 2006-07-02 ENCOUNTER — Telehealth: Payer: Self-pay | Admitting: Internal Medicine

## 2006-07-02 NOTE — Telephone Encounter (Signed)
Received fax from [facility] requesting for a PA on Nexium 40mg . Pa completed and faxed.  Malary Aylesworth L MA

## 2006-07-11 NOTE — Telephone Encounter (Signed)
PA for Nexium has been approved from 06/06/06-06/07/07. Only prescriptions reimbursed by the Prescription Drug Benefit as outlined on page 83 of the 2007 Service Benefit Plan Brochure are eligible. You may advise your patient of the outcome. A letter of explanation will also be mailed to the patient. Notifed Brandi at [employer] and left message to notify patient.  Mardelle Pandolfi L MA

## 2006-08-29 ENCOUNTER — Ambulatory Visit

## 2006-08-29 ENCOUNTER — Other Ambulatory Visit: Payer: Self-pay | Admitting: Internal Medicine

## 2006-08-29 MED ORDER — AMBIEN CR 6.25 MG TABLET,EXTENDED RELEASE
EXTENDED_RELEASE_TABLET | ORAL | Status: DC
Start: 2006-08-29 — End: 2006-09-04

## 2006-08-29 NOTE — Telephone Encounter (Signed)
rx written. Please fax with instructions below.  [facility]

## 2006-08-29 NOTE — Telephone Encounter (Signed)
Pt will need a  urgent refill faxed to [facility] for a over night express. Per Dennard Nip pt will need a refill for the Ambien mg?  All pt meds where tranfer from [facility] to [facility] accept this one.    Please have this shipped to [facility]  [facility]  [address]  [place], [place] [address].

## 2006-08-30 NOTE — Telephone Encounter (Signed)
Rx faxed to [facility] with all details below attached.Fountain Derusha L MA

## 2006-09-04 ENCOUNTER — Other Ambulatory Visit: Payer: Self-pay | Admitting: Internal Medicine

## 2006-09-04 MED ORDER — AMBIEN CR 6.25 MG TABLET,EXTENDED RELEASE
EXTENDED_RELEASE_TABLET | ORAL | Status: DC
Start: 2006-09-04 — End: 2007-01-31

## 2006-09-04 NOTE — Telephone Encounter (Signed)
Pt will need a refill sent to her local pharmacy. Pt was told by the [facility] that the earliest they can get the Ambien 6.25 mg out to her would be on 09/10/06 ,so pt would like to have rx sent out to the marked above phramacy.

## 2006-09-04 NOTE — Telephone Encounter (Signed)
Medication refills given. call pt. JH

## 2007-01-31 ENCOUNTER — Other Ambulatory Visit: Payer: Self-pay | Admitting: Internal Medicine

## 2007-01-31 MED ORDER — AMBIEN CR 6.25 MG TABLET,EXTENDED RELEASE
EXTENDED_RELEASE_TABLET | ORAL | Status: DC
Start: 2007-01-31 — End: 2007-07-08

## 2007-01-31 NOTE — Telephone Encounter (Signed)
Rx faxed

## 2007-01-31 NOTE — Telephone Encounter (Signed)
rx written. Please fax.  JH

## 2007-01-31 NOTE — Telephone Encounter (Signed)
Pt is requesting a Rx refill for a 90 day supply with 3 refills for the above medication to be sent to [facility]. Thanks.    FYI: CHANGED ADDRESS FOR MAIL ORDER TO [place] PER PT, UNTIL SHE RETURNS BACK TO [place].

## 2007-03-07 ENCOUNTER — Other Ambulatory Visit: Payer: Self-pay | Admitting: Internal Medicine

## 2007-03-07 NOTE — Telephone Encounter (Signed)
Rx received and faxed today to [employer] MA

## 2007-03-07 NOTE — Telephone Encounter (Signed)
This is a faxed request for mail order refill of  Metoprolol and Ramipril. Please fax back to [facility]  at [phone]. The paper copy is at your work station. [name] MA

## 2007-03-07 NOTE — Telephone Encounter (Signed)
rx written. Please fax.  JH

## 2007-05-02 ENCOUNTER — Telehealth: Payer: Self-pay | Admitting: Internal Medicine

## 2007-05-02 NOTE — Telephone Encounter (Signed)
Pa faxed. Noorah Giammona MA

## 2007-05-02 NOTE — Telephone Encounter (Signed)
[employer] has sent a  Form for prior approval of sedative-hypnotics. Please see form at your wk station.[name] MA

## 2007-05-02 NOTE — Telephone Encounter (Signed)
PA completed. please fax. JH

## 2007-05-07 NOTE — Telephone Encounter (Signed)
Per [employer], the pa request has been approved for Ambien CR 6.25mg  a quantity of 90 every 90 days. The authorization is valid from 04/11/07 to 04/10/08. Only prescriptions reimbursed by the Prescription Drug Benefit(ie, drugs dispensed at retail or mail-order) as outlined on page 9 of the 2007 Service Benefit Plan Brochure are eligible. You may advise your patient of the outcome. A letter of explanation will also be mailed to the patient.    Tried to reach pt but the home number is not able to ring, I will mail a copy of the letter to pt's home address.

## 2007-05-08 ENCOUNTER — Telehealth: Payer: Self-pay | Admitting: Internal Medicine

## 2007-05-08 ENCOUNTER — Ambulatory Visit

## 2007-05-08 NOTE — Telephone Encounter (Signed)
Labs ordered.  JH

## 2007-05-08 NOTE — Telephone Encounter (Signed)
Pt has scheduled a annual appt for 05/15/07, @ 3:00pm, and pt is requesting a lab slip for routine check.  Pt would like to come in tomorrow Thurs-05/09/07 to have labs drawn.

## 2007-05-08 NOTE — Telephone Encounter (Signed)
Called pt through relay service.  Left mess for pt on answer machine regarding labs ordered. [name] MA

## 2007-05-15 ENCOUNTER — Telehealth: Payer: Self-pay | Admitting: Internal Medicine

## 2007-05-15 ENCOUNTER — Encounter: Admitting: Internal Medicine

## 2007-05-15 ENCOUNTER — Ambulatory Visit: Admitting: Internal Medicine

## 2007-05-15 ENCOUNTER — Ambulatory Visit

## 2007-05-15 VITALS — BP 118/72 | HR 72 | Temp 98.0°F | Resp 14 | Ht 65.5 in | Wt 217.0 lb

## 2007-05-15 NOTE — Telephone Encounter (Signed)
Pt has arrived to appt. Thanks.

## 2007-05-15 NOTE — Progress Notes (Signed)
Melinda [name]  presents for preventive care.  Her last physical was 2 year(s) ago.  In general she has been feeling well and functioning well at home, work, and personal relationships.  She recently returned from [place]. [place], staying with daughter for past yr.  C/o tired, SOBOE x several weeks, no energy, gets tired shopping, walking, no CP, FC, NV, LH, palpitations.  H/o DVT on coumadin.  Previous Paps have been normal Previous mammograms have been normal.     Immunization History   Name Date(s) Administered    Influenza Vaccine (Fluarix) 05/15/2007    Influenza Virus Vaccine, Split (0.5 Ml) 06/26/2005    Influenza Virus Vaccine, Whole 05/25/2004     Family history is significant for unchanged.  Sexual History: is heterosexual and monogamous  Social history: I  did review and update substance use history form.  CAGE questions positive for no to all  Safety issues:  The patient does routinely use seatbelts.     ROS:    Constitutional: fatigue.  Eyes: negative.  Ears, Nose, Mouth, Throat: negative.  CV: negative.  Resp: shortness of breath.  GI: negative.  GU: negative.  Musculoskeletal: negative.  Integumentary: negative.  Neuro: negative.  Psych: euthymic.      Exam:    General Appearance: skin warm, dry, and pink, cooperative, hearing impaired, sign interpretor present.  Eyes:  conjunctivae and corneas clear. PERRL, EOM's intact. sclerae normal.  Mouth: normal.  Neck:  Neck supple. No adenopathy, thyroid symmetric, normal size.  Heart:  normal rate and regular rhythm, no murmurs, clicks, or gallops.  Lungs: clear to auscultation.  Abdomen: BS normal.  Abdomen soft, non-tender.  No masses or organomegaly.  Extremities:  no cyanosis, clubbing, or edema.  Skin:  Skin color, texture, turgor normal. No rashes or lesions.  Neuro: Gait normal. Reflexes normal and symmetric. Sensation and strength grossly normal.  Mental Status: Appearance/Cooperation: in no apparent distress, alert, afebrile, cooperative and well  hydrated.    ASSESSMENT:    V70.0 Routine General Medical Examination at a Health Care Facility  (primary encounter diagnosis)  Comment:   Plan: INFLUENZA VACCINE, ADULT, TRACE THIMEROSAL,         MAMMOGRAPHY SCREENING, MAMMOGRAPHY SCREENING            401.9 HYPERTENSION NOS  Comment:   Plan:     272.4 HYPERLIPIDEMIA NEC/NOS  Comment:   Plan: continue current medications    453.8 VENOUS THROMBOSIS NEC  Comment: DVT  Plan: follow up coumadin clinic    625.6 Female Stress Incontinence  Comment:   Plan:     733.00 OSTEOPOROSIS NOS  Comment:   Plan: continue current medications    285.9Y Anemia  Comment: poss PUD  Plan: FERRITIN, IRON TOTAL, TRANSFERRIN,         GASTROENTEROLOGY REFERRAL         I did review patient's past medical and family/social history, no changes noted.  Barriers to Learning assessed: none. Patient verbalizes understanding of teaching and instructions.      [name], M.D.

## 2007-05-15 NOTE — Telephone Encounter (Signed)
Pt is calling to see if you want her to remove her Pessary, before seeing you for a pap smear?  Now pt say's that she will just wait until she see's you att 15:00 today to ask you!

## 2007-05-15 NOTE — Telephone Encounter (Signed)
Remove pessary prior to appt.  [facility]

## 2007-05-15 NOTE — Nursing Note (Signed)
>>   HUYEN T NGUYEN     Wed May 15, 2007  3:39 PM  Immunization VIS documentation(s) were given to patient to review. All questions were answered and the patient consented to the Immunization(s) being given. Patient allergies were reviewed and no contraindications were found. The immunization(s) were given as ordered. The patient was observed for any immediate reactions to the vaccine. None were observed.  huyen Silver Plume MA    >> Roscoe E HARRIS     Wed May 15, 2007  3:03 PM  Vital signs taken, allergies verified and screened for pain.  Heide Guile MA

## 2007-05-15 NOTE — Telephone Encounter (Signed)
Called LVM for pt to return call.

## 2007-05-16 ENCOUNTER — Encounter: Payer: Self-pay | Admitting: Internal Medicine

## 2007-05-20 ENCOUNTER — Ambulatory Visit

## 2007-05-22 ENCOUNTER — Encounter

## 2007-05-24 ENCOUNTER — Telehealth: Payer: Self-pay | Admitting: Internal Medicine

## 2007-05-24 MED ORDER — ACIPHEX 20 MG TABLET,DELAYED RELEASE
DELAYED_RELEASE_TABLET | ORAL | Status: AC
Start: 2007-05-24 — End: 2008-06-23

## 2007-05-24 NOTE — Telephone Encounter (Signed)
Pt is requesting refill for ACIPHEX 20 MG TAB, pt states that she was told by PCP to take 2 a day not 1. Please fax order to [facility] for a 90 day supply

## 2007-05-24 NOTE — Telephone Encounter (Signed)
rx written. Please fax.  JH

## 2007-05-24 NOTE — Telephone Encounter (Signed)
RX FAXED TO [facility]. [name] MA

## 2007-05-28 ENCOUNTER — Telehealth: Payer: Self-pay | Admitting: Internal Medicine

## 2007-05-28 MED ORDER — EVISTA 60 MG TABLET
ORAL_TABLET | ORAL | Status: AC
Start: 2007-05-28 — End: 2008-06-27

## 2007-05-28 MED ORDER — ALTACE 10 MG CAPSULE
ORAL_CAPSULE | ORAL | Status: DC
Start: 2007-05-28 — End: 2008-01-02

## 2007-05-28 MED ORDER — FERROUS SULFATE 325 MG (65 MG IRON) TABLET
ORAL_TABLET | ORAL | Status: AC
Start: 2007-05-28 — End: 2008-01-23

## 2007-05-28 MED ORDER — ZOCOR 20 MG TABLET
ORAL_TABLET | ORAL | Status: AC
Start: 2007-05-28 — End: 2008-06-27

## 2007-05-28 MED ORDER — DETROL LA 4 MG CAPSULE,EXTENDED RELEASE
EXTENDED_RELEASE_CAPSULE | ORAL | Status: DC
Start: 2007-05-28 — End: 2008-08-05

## 2007-05-28 NOTE — Telephone Encounter (Signed)
Patient calling stated that [facility] is requesting PA for above medication due to change of dosage? Please process.

## 2007-05-28 NOTE — Telephone Encounter (Signed)
05/28/07 All Rx was faxed over to [facility] mail order.

## 2007-05-28 NOTE — Telephone Encounter (Signed)
Please start PA for aciphex bid.  [facility]

## 2007-05-28 NOTE — Telephone Encounter (Signed)
rx written. Please fax.  JH

## 2007-05-28 NOTE — Telephone Encounter (Signed)
1.Patient requesting new rx for  iron pills for her anemia for 90 day supply with 3 refills sent to [facility].    2.patient requesting refills for above medications for 90 day supply with 3 refills. Please send to [facility].

## 2007-05-29 NOTE — Telephone Encounter (Signed)
Member id# [id number].  Called [facility] to start pa for aciphex 20mg  take two daily. Called  B/c b/s fep [phone].  Pa approved 05/29/07 x 1 year.  Spoke with roxanne T.  I called pt and left a mess for pt regarding pa approval. [name] MA

## 2007-05-30 ENCOUNTER — Encounter: Admitting: Internal Medicine

## 2007-05-31 ENCOUNTER — Ambulatory Visit: Admitting: Internal Medicine

## 2007-05-31 VITALS — BP 104/62 | HR 80 | Wt 218.0 lb

## 2007-05-31 NOTE — Nursing Note (Signed)
>>   Melinda [name]     Fri May 31, 2007  3:55 PM  Vital signs taken, allergies verified, screened for pain, pharmacy verified.  Vernell Barrier, [place]

## 2007-05-31 NOTE — Progress Notes (Signed)
Chief Complaint   Patient presents with    Foot Problem     swelling    Leg Problem     swelling         Subjective:   Melinda [name] is a(n) 71yr old female who presents for a new problem of swelling in legs, feet.    She states that since onset 2 week(s) ago, the symptoms are rapidly improving.  Associated symptoms include swelling in lower legs, feet, no pain, SOB, orthopnea, PND, CP, much improved past 2-3 days, h/o DVT on coumadin.  She has had similar symptoms in the past.  H/o iron deficiency anemia, taking iron supplements, appt in GI 3 weeks.    ROS:    Constitutional: negative.  Eyes: negative.  Ears, Nose, Mouth, Throat: negative.  CV: negative.  Resp: negative.  GI: negative.  GU: negative.  Musculoskeletal: negative.      History:  I did review patient's past medical and family/social history, no changes noted.    Objective:      General Appearance: skin warm, dry, and pink, cooperative.  Neck:  Neck supple. No adenopathy, thyroid symmetric, normal size.  Heart:  normal rate and regular rhythm, no murmurs, clicks, or gallops.  Lungs: clear to auscultation and normal percussion.  Extremities:  no cyanosis, clubbing, or edema.  Skin:  Skin color, texture, turgor normal. No rashes or lesions.  Mental Status: Appearance/Cooperation: in no apparent distress, alert, afebrile, cooperative, well hydrated and hearing impaired.      Assessment: LE edema, venous insufficient  See Diagnoses Section.    Plan:  Elevate, decrease Na+ in diet  See Orders.    459.89F Venous Insufficiency  (primary encounter diagnosis)  Comment:   Plan:     401.9 HYPERTENSION NOS  Comment:   Plan:     272.4 HYPERLIPIDEMIA NEC/NOS  Comment:   Plan:     285.9Y Anemia  Comment:   Plan: follow up GI, hold coumadin 5 days prior to procedure      Patient Instruction:  See Patient Education section.      Barriers to Learning assessed: none. Patient verbalizes understanding of teaching and instructions.

## 2007-06-03 ENCOUNTER — Other Ambulatory Visit: Payer: Self-pay | Admitting: Internal Medicine

## 2007-06-03 NOTE — Telephone Encounter (Signed)
Called pt through relay service [phone].  Left mess for pt regarding dr. Sandria Manly message on pt's answer machine. [name] MA

## 2007-06-03 NOTE — Telephone Encounter (Signed)
Ferrous sulfate is probably not covered since it is OTC medication.  She can use humidifier in bedroom if the air seems dry.  [facility]

## 2007-06-03 NOTE — Telephone Encounter (Signed)
Pt states that she received all her Rx except for FERROUS SULFATE, Please refax to:    [facility]  ID# [id number]  Phone# [phone]  Fax# [phone]      Pt is also asking how much Humidity is appropriate for her?

## 2007-06-04 ENCOUNTER — Telehealth: Payer: Self-pay | Admitting: Internal Medicine

## 2007-06-04 NOTE — Telephone Encounter (Signed)
Pt states that she has been having difficulty sleeping for the past few days. Pt states that she is only able to get a few hours of sleep. Pt states that she is taking Ambien and for the past 3 days it has put her to sleep but not kept her to sleep. Pt also states that she is Anemia and was really cold last night. Pt would like advise on what else to take?

## 2007-06-04 NOTE — Telephone Encounter (Signed)
Not sure how to reach pt by phone.

## 2007-06-04 NOTE — Telephone Encounter (Signed)
Take ambien CR 6.25 mg two tabs at HS.  [facility]

## 2007-06-04 NOTE — Telephone Encounter (Signed)
Phone is busy

## 2007-06-05 NOTE — Telephone Encounter (Signed)
Left msg with MD msg below and to c/b if further questions.Fawne Hughley M Javana Schey, R.N.

## 2007-06-05 NOTE — Telephone Encounter (Signed)
Called via [employer] and reviewed MD msg with Husb, as patient still sleeping. Will try later  to make certain patient notified. I see she mentioned anemia and wondered what else may be done/taken. Per EMR CBC from 9/26: Hgb:7.6  Hct: 23.6, labs from 10/1: Ferritin: 7, Total Fe+:12,  ?transfusion. FeSo4 ordered 10/14 TID.Please advise.Yetta Barre Aurea Aronov, R.N.

## 2007-06-05 NOTE — Telephone Encounter (Signed)
Lets repeat CBC.  [facility]

## 2007-06-06 NOTE — Telephone Encounter (Addendum)
Pt wanted to let you know that she took 2 Ambien CR 6.25mg  last night and slept well. She also wanted to know if you want her to continue taking the Ambien CR for now on, or just for short while? If you want her to continue taking the Ambien you will need to send a refill request to [facility] for a Qty of 180 with 3 refills.      FYI:  Also pt wanted to let you know that she will be going to [facility] on Friday, to test her blood , because she has been having blood in her nose with mucus.

## 2007-06-06 NOTE — Telephone Encounter (Addendum)
Take ambien CR 6.25 mg two per night for few days only, then go back to one per night.  [facility]

## 2007-06-06 NOTE — Telephone Encounter (Addendum)
Called LM through Relay for pt spouse of below md's mesage.  Thanks

## 2007-06-07 ENCOUNTER — Telehealth: Payer: Self-pay | Admitting: Internal Medicine

## 2007-06-07 ENCOUNTER — Encounter

## 2007-06-07 NOTE — Telephone Encounter (Signed)
Patient would like to inform you that the Anticoagulation doctor, Dr Cliffton Asters, has decided to get me off Warfarin. She does not need to go to Anticoagulation clinic for INR blood tests anymore.  Hopefully!!

## 2007-06-11 ENCOUNTER — Encounter: Payer: Self-pay | Admitting: Internal Medicine

## 2007-06-11 NOTE — Telephone Encounter (Signed)
That's good news for her.  [facility]

## 2007-06-20 ENCOUNTER — Encounter: Payer: Self-pay | Admitting: Gastroenterology

## 2007-06-20 ENCOUNTER — Ambulatory Visit

## 2007-06-20 ENCOUNTER — Inpatient Hospital Stay: Admit: 2007-06-20 | Attending: Gastroenterology | Admitting: Gastroenterology

## 2007-06-20 MED ORDER — PROMETHAZINE 25 MG/ML INJECTION SOLUTION
12.5000 mg | INTRAMUSCULAR | Status: DC | PRN
Start: 2007-06-20 — End: 2007-06-22
  Filled 2007-06-20: qty 1

## 2007-06-20 MED ORDER — NALOXONE 0.4 MG/ML INJECTION SOLUTION
0.4000 mg | INTRAMUSCULAR | Status: DC | PRN
Start: 2007-06-20 — End: 2007-06-22
  Filled 2007-06-20: qty 1

## 2007-06-20 MED ORDER — DIPHENHYDRAMINE 50 MG/ML INJECTION SOLUTION
12.5000 mg | INTRAMUSCULAR | Status: DC | PRN
Start: 2007-06-20 — End: 2007-06-22
  Filled 2007-06-20: qty 1

## 2007-06-20 MED ORDER — FLUMAZENIL 0.1 MG/ML INTRAVENOUS SOLUTION
0.2000 mg | INTRAVENOUS | Status: DC | PRN
Start: 2007-06-20 — End: 2007-06-22
  Filled 2007-06-20: qty 5

## 2007-06-20 MED ORDER — FENTANYL (PF) 50 MCG/ML INJECTION SOLUTION
12.5000 ug | INTRAMUSCULAR | Status: DC | PRN
Start: 2007-06-20 — End: 2007-06-22
  Filled 2007-06-20: qty 2

## 2007-06-20 MED ORDER — MIDAZOLAM 1 MG/ML INJECTION SOLUTION
0.5000 mg | INTRAMUSCULAR | Status: DC | PRN
Start: 2007-06-20 — End: 2007-06-22
  Filled 2007-06-20: qty 4

## 2007-06-20 MED ORDER — NACL 0.9% IV INFUSION
INTRAVENOUS | Status: DC
Start: 2007-06-20 — End: 2007-06-22
  Administered 2007-06-20: 16:00:00 via INTRAVENOUS
  Filled 2007-06-20: qty 1000

## 2007-06-24 ENCOUNTER — Telehealth: Payer: Self-pay | Admitting: Internal Medicine

## 2007-06-24 NOTE — Telephone Encounter (Signed)
Pt calling through relay health states [facility] instructed pt that Rx Ambien CR is NOT covered by her insurance. Pt is requesting a PA for Rx Ambien CR to be sent to the below number. Thanks.    [employer] #  Phone#[phone]

## 2007-06-24 NOTE — Telephone Encounter (Signed)
Please start PA for ambien CR.  JH

## 2007-06-24 NOTE — Telephone Encounter (Signed)
Pa started, but not able to which insurance carrier is.

## 2007-06-26 ENCOUNTER — Telehealth: Payer: Self-pay | Admitting: Internal Medicine

## 2007-06-26 ENCOUNTER — Encounter

## 2007-06-26 DIAGNOSIS — K219 Gastro-esophageal reflux disease without esophagitis: Secondary | ICD-10-CM | POA: Insufficient documentation

## 2007-06-26 NOTE — Telephone Encounter (Signed)
Patient referred to GI,  Lacie Draft .  [facility]

## 2007-06-26 NOTE — Telephone Encounter (Signed)
Pt states that she needs an referral to have ongoing appts to see Dr. Lacie Draft. Pt states that she saw MD about a year ago and was told she needed to have a referral to have an appt. Pt states that she has to much acid in her stomach which is causing to much giant belching.

## 2007-06-27 ENCOUNTER — Encounter: Payer: Self-pay | Admitting: Internal Medicine

## 2007-06-27 NOTE — Telephone Encounter (Signed)
Pa faxed to [facility],

## 2007-06-27 NOTE — Telephone Encounter (Signed)
PA completed. please fax. JH

## 2007-06-28 NOTE — Telephone Encounter (Signed)
Called pt, l/m with her answering service per relay.

## 2007-07-01 ENCOUNTER — Telehealth: Payer: Self-pay | Admitting: Internal Medicine

## 2007-07-01 NOTE — Telephone Encounter (Signed)
Pt needs all her medication changed to generic starting in January-2009.  Pt says in Jan the co-pay on her name brand meds is jumping sky high, and pt was told by [facility] that the generic co-pay will only be 10.00, pt's medications are:   Evista  Detrol LA  Ramipril  Aciphex  Simvastatin  Metoprolol  Toprol  Mexium  Please fax new RX to:  [facility]  ID# [id number]  Phone# [phone]  06/20/07 Pt stopped coumadin 2 Fridays ago prior to todays procedure. LDollison, RN  Fax# [phone]

## 2007-07-01 NOTE — Telephone Encounter (Signed)
Per [employer], Ambien cr 6.25mg  tabs to a quantity of 90 every 90 days is valid from 04/11/07 through 04/10/08. You may advise your pt of the outcome. A letter of explanation will also be mailed to the patient. Also copy of this letter faxed to [facility],

## 2007-07-01 NOTE — Telephone Encounter (Signed)
I'm leaving for Dr. Hosoume.

## 2007-07-02 MED ORDER — METOPROLOL TARTRATE 50 MG TABLET
ORAL_TABLET | ORAL | Status: DC
Start: 2007-07-02 — End: 2008-01-02

## 2007-07-02 NOTE — Telephone Encounter (Signed)
Mail order rx faxed to [facility] pharmacy, called pt through relay service, left mess for pt regarding dr. Burna Cash note. [name] MA

## 2007-07-02 NOTE — Telephone Encounter (Signed)
I will change Toprol XL 100 mg once a day to metoprolol 50 mg twice a day, please fax to [facility]. Simvastatin is already the generic form.  All the other meds are not available in generic form and these cannot be changed to different meds.  [facility]

## 2007-07-05 ENCOUNTER — Telehealth: Payer: Self-pay | Admitting: Internal Medicine

## 2007-07-05 NOTE — Telephone Encounter (Addendum)
THIS MESSAGE CAN BE LEFT FOR DR Nyoka Cowden with [facility] says that the one of the Pharmacist is requesting to speak with Dr Gregary Cromer about the metoprolol 50mg  RX faxed on 07/01/07, before dispensing this RX to the pt.  Alona Bene states that the pharmacist did not specify exactly what about the medication the pharmacist was requesting.     ZOX#[id number]  9:00am- 5:30pm-eastern time

## 2007-07-08 ENCOUNTER — Other Ambulatory Visit: Payer: Self-pay | Admitting: Internal Medicine

## 2007-07-08 MED ORDER — AMBIEN CR 6.25 MG TABLET,EXTENDED RELEASE
EXTENDED_RELEASE_TABLET | ORAL | Status: DC
Start: 2007-07-08 — End: 2007-07-08

## 2007-07-08 MED ORDER — AMBIEN CR 6.25 MG TABLET,EXTENDED RELEASE
EXTENDED_RELEASE_TABLET | ORAL | Status: DC
Start: 2007-07-08 — End: 2007-07-17

## 2007-07-08 NOTE — Telephone Encounter (Signed)
Rx faxed to [facility] today.Melinda Taylor Ma

## 2007-07-08 NOTE — Telephone Encounter (Signed)
Rx written please fax to [facility]

## 2007-07-08 NOTE — Telephone Encounter (Signed)
Patient wanted generic meds so Toprol XL 100 mg was relaced with generic metoprolol 50 mg BID.  [facility]

## 2007-07-08 NOTE — Telephone Encounter (Signed)
The pharmacy  has been notified of this information and all questions answered.Ernestine Taylor Ma

## 2007-07-08 NOTE — Telephone Encounter (Signed)
Pt is requesting an 90-day supply of above med sent to      [facility]  ID# [phone]  Phone# [phone]  Fax#[phone]

## 2007-07-10 ENCOUNTER — Telehealth: Payer: Self-pay | Admitting: Internal Medicine

## 2007-07-10 ENCOUNTER — Ambulatory Visit

## 2007-07-10 NOTE — Telephone Encounter (Signed)
Agree with plan. JH

## 2007-07-10 NOTE — Telephone Encounter (Signed)
She is calling requesting advice about blood in stool. The problem started 1 day ago, and is increasing. Pt says for the past 2wks she has beenhaving quite a bit bowel movements, and yesterday evening while pt was rearranging her closet she p/u a heavy bag of clothes then pt felt dizzy and lightheaded and she sat down for a moment, then pt had to go to the restroom and have a bowelmovement, that is when pt noticed the bleeding from her rectum, which was a considerable amount.  Pt says this has never happened before and pt does not have a HX of hemorrhoids.  Pt says that the blood seemed as though it was on the outside of the bowel movement.    Pt says she has been taking iron pills so her bowels have been blk/grn    She requests advice.

## 2007-07-10 NOTE — Telephone Encounter (Signed)
Spoke with patient via Financial planner. Says, as below, she lifted heavy bag of clothes, and afterward  had bowel movement, and with wiping she noticed a "moderate" amt of bright red blood on paper. Denies any blood dripping into toilet, but says it did take numerous wipes to stop the bleeding. No hx of hemorrhoids and doesn't feel any swelling around anus. She has since had 2 add'l stools which did not have any further blood. Did have dizziness when lifting bag, which has continued and is more prevalent with bending forward. She then mentions that she has been having lower extremity edema, L > R , if she attempts to wear any socks they leave large indentations. Has been trying to keep feet elevated, but no significant improvement. Denies any chest pn/sob/no abdo pn/cramping. Explained based on fact that she had pretty significant anemia 10/24, Hgb: 8.8  Hct: 27.6, I have concerns about worsening anemia as  unsure amt lost with bowel movement, and now with the new onset of lower ext edema, need to  r/o CHF. [facility] hospital tonight...Pt. agreeable to plan.Yetta Barre Kaelyn Innocent, R.N.

## 2007-07-17 ENCOUNTER — Encounter: Admitting: Women's Health

## 2007-07-17 ENCOUNTER — Ambulatory Visit: Admitting: Internal Medicine

## 2007-07-17 ENCOUNTER — Ambulatory Visit

## 2007-07-17 VITALS — BP 120/80 | HR 70 | Temp 97.0°F | Resp 18 | Wt 218.0 lb

## 2007-07-17 MED ORDER — LASIX 20 MG TABLET
ORAL_TABLET | ORAL | Status: DC
Start: 2007-07-17 — End: 2007-09-26

## 2007-07-17 MED ORDER — POTASSIUM CHLORIDE ER 10 MEQ TABLET,EXTENDED RELEASE
EXTENDED_RELEASE_TABLET | ORAL | Status: DC
Start: 2007-07-17 — End: 2007-09-26

## 2007-07-17 MED ORDER — AMBIEN CR 6.25 MG TABLET,EXTENDED RELEASE
EXTENDED_RELEASE_TABLET | ORAL | Status: DC
Start: 2007-07-17 — End: 2007-07-19

## 2007-07-17 NOTE — Nursing Note (Signed)
>>   Melinda [name]     Wed Jul 17, 2007  2:09 PM  Vital signs taken, allergies verified, screened for pain, med hx taken   Berniece Andreas, [place]

## 2007-07-17 NOTE — Progress Notes (Signed)
Chief Complaint   Patient presents with    Follow Up With Specialist     from ER    Swelling     of the feet         Subjective:   Melinda [name] is a(n) 71yr old female who presents for follow up of BRBPR.    She states that since onset 2 week(s) ago, the symptoms are complete resolution.  Associated symptoms include single episode of BRBPR, seen in ER [place], tests negative, instructed to get colonoscopy.  She has not had similar symptoms in the past.  C/o swelling in legs x 2 weeks, no pain, red, dc, pus. No SOB, cough.  H/o DVT, off coumadin x 1 month.    ROS:    Constitutional: negative.  Eyes: negative.  Ears, Nose, Mouth, Throat: negative.  CV: negative.  Resp: negative.  GI: negative.  GU: negative.  Musculoskeletal: negative.      History:  I did review patient's past medical and family/social history, no changes noted.    Objective:      General Appearance: skin warm, dry, and pink, cooperative, older white woman, deaf.  Neck:  Neck supple. No adenopathy, thyroid symmetric, normal size.  Heart:  normal rate and regular rhythm, no murmurs, clicks, or gallops.  Lungs: clear to auscultation and normal percussion.  Abdomen: BS normal.  Abdomen soft, non-tender.  No masses or organomegaly.  Extremities:  peripheral edema 2+ left anterior tibia, 1+ right .  Skin:  Skin color, texture, turgor normal. No rashes or lesions.  Mental Status: Appearance/Cooperation: in no apparent distress, alert, afebrile and cooperative      Assessment: BRBPR, resolved  See Diagnoses Section.    Plan:  Refer to GI for colonoscopy.  See Orders.    578.1 Blood in Stool  (primary encounter diagnosis)  Comment:   Plan: GASTROENTEROLOGY REFERRAL            530.81K GERD (Gastroesophageal Reflux Disease)  Comment:   Plan:     782.3AD Edema Leg  Comment:   Plan: LASIX 20 MG TAB, POTASSIUM CHLORIDE SR 10 MEQ         TAB            780.52 Insomnia, Unspecified  Comment:   Plan: AMBIEN CR 6.25 MG TAB            401.9  HYPERTENSION NOS  Comment:   Plan: continue current medications      Patient Instruction:  See Patient Education section.      Barriers to Learning assessed: none. Patient verbalizes understanding of teaching and instructions.

## 2007-07-18 ENCOUNTER — Telehealth: Payer: Self-pay | Admitting: Internal Medicine

## 2007-07-18 ENCOUNTER — Ambulatory Visit

## 2007-07-18 NOTE — Telephone Encounter (Signed)
Pharmacy notified.

## 2007-07-18 NOTE — Telephone Encounter (Signed)
Ok to fill Lasix.  [facility]

## 2007-07-18 NOTE — Telephone Encounter (Signed)
Pt have a sulfa allergy in pt file, Do MD still wants to fill Lasix? Please advise,

## 2007-07-19 ENCOUNTER — Telehealth: Payer: Self-pay | Admitting: Internal Medicine

## 2007-07-19 MED ORDER — AMBIEN CR 6.25 MG TABLET,EXTENDED RELEASE
EXTENDED_RELEASE_TABLET | ORAL | Status: DC
Start: 2007-07-19 — End: 2007-09-26

## 2007-07-19 NOTE — Telephone Encounter (Addendum)
Called and LVM through relay services.  Thanks

## 2007-07-19 NOTE — Telephone Encounter (Signed)
Sent.  Thank you.

## 2007-07-19 NOTE — Telephone Encounter (Signed)
Pt is calling requesting a refill of Ambien CR 6.5mg  a Qty 7. Because it will take 5 days to get from [facility].  Also she will be having upper and lower Gi done on 07/26/07 with Dr. Elfredia Nevins, and she wanted to let you know that she will p/u her lasix today from pharmacy.

## 2007-07-22 ENCOUNTER — Telehealth: Payer: Self-pay | Admitting: Internal Medicine

## 2007-07-22 NOTE — Telephone Encounter (Signed)
Pt requesting a change on meds of Ambien CR 6.25 would like generic brand Zolpitem for now on due to cost. Starting in January she would have to pay $65 instead of $40

## 2007-07-23 NOTE — Telephone Encounter (Signed)
Does she have ambien CR 6.25 mg?  If she has [place] CR now then call for rx zolpidem one week before she runs out.  [facility]

## 2007-07-23 NOTE — Telephone Encounter (Signed)
Message left for patient to return my call.  Melinda [name]

## 2007-07-23 NOTE — Telephone Encounter (Signed)
Pt says she received the ambien CR 6.25mg , 90-day supply, today from [facility], and pt will not be p/u the urgent 7-day requested from local pharm, and she will call 1wk before she needs a refill in 3mos, per Dr Gregary Cromer.

## 2007-07-24 ENCOUNTER — Encounter: Admitting: Women's Health

## 2007-07-26 ENCOUNTER — Other Ambulatory Visit: Payer: Self-pay | Admitting: Gastroenterology

## 2007-07-26 ENCOUNTER — Encounter: Payer: Self-pay | Admitting: Gastroenterology

## 2007-07-26 ENCOUNTER — Inpatient Hospital Stay: Admit: 2007-07-26 | Discharge: 2007-07-26 | Attending: Gastroenterology | Admitting: Gastroenterology

## 2007-07-26 MED ORDER — NALOXONE 0.4 MG/ML INJECTION SOLUTION
0.4000 mg | INTRAMUSCULAR | Status: DC | PRN
Start: 2007-07-26 — End: 2007-07-28
  Filled 2007-07-26: qty 1

## 2007-07-26 MED ORDER — PROMETHAZINE 25 MG/ML INJECTION SOLUTION
12.5000 mg | INTRAMUSCULAR | Status: DC | PRN
Start: 2007-07-26 — End: 2007-07-28
  Filled 2007-07-26: qty 1

## 2007-07-26 MED ORDER — FENTANYL (PF) 50 MCG/ML INJECTION SOLUTION
25.0000 ug | Freq: Once | INTRAMUSCULAR | Status: AC
Start: 2007-07-26 — End: 2007-07-26
  Administered 2007-07-26: 100 ug via INTRAVENOUS
  Filled 2007-07-26: qty 2

## 2007-07-26 MED ORDER — NACL 0.9% IV INFUSION
INTRAVENOUS | Status: DC
Start: 2007-07-26 — End: 2007-07-28
  Administered 2007-07-26: 500 mL via INTRAVENOUS
  Filled 2007-07-26: qty 1000

## 2007-07-26 MED ORDER — FLUMAZENIL 0.1 MG/ML INTRAVENOUS SOLUTION
0.2000 mg | INTRAVENOUS | Status: DC | PRN
Start: 2007-07-26 — End: 2007-07-28
  Filled 2007-07-26: qty 5

## 2007-07-26 MED ORDER — MEPERIDINE (PF) 50 MG/ML INJECTION SOLUTION
12.5000 mg | INTRAMUSCULAR | Status: DC | PRN
Start: 2007-07-26 — End: 2007-07-28
  Filled 2007-07-26: qty 2

## 2007-07-26 MED ORDER — MIDAZOLAM 1 MG/ML INJECTION SOLUTION
0.5000 mg | INTRAMUSCULAR | Status: DC | PRN
Start: 2007-07-26 — End: 2007-07-28
  Administered 2007-07-26: 4 mg via INTRAVENOUS
  Filled 2007-07-26: qty 4

## 2007-07-26 MED ORDER — DIPHENHYDRAMINE 50 MG/ML INJECTION SOLUTION
12.5000 mg | INTRAMUSCULAR | Status: DC | PRN
Start: 2007-07-26 — End: 2007-07-28
  Filled 2007-07-26: qty 1

## 2007-07-26 NOTE — Patient Instructions (Signed)
FINDINGS: huge hiatal hernia      RECS: schedule for colonoscopy and UGI

## 2007-07-27 NOTE — Procedures (Addendum)
PATIENT: Melinda [name], [name]   MR #: [id number] SEX: F AGE: 71  OPERATION DATE: 07/26/2007 DOB: 1936-03-12    GASTROENTEROLOGY OPERATION RECORD    PREOP FINDINGS:    1. History of Cameron ulcers.  2. Microcytic anemia.    POSTOP FINDINGS:    1. Huge hiatal hernia containing at least 40% of the stomach. 2. No Cameron ulcers noted.  3. Normal duodenal villi, status post duodenal biopsy.    PROCEDURE: Esophagogastroduodenoscopy  Subprocedure: Duodenal biopsy.    INDICATIONS:    This is a 71 year old female with a history of Cameron ulcers seen in September 2006. She was noted to have microcytic anemia, with hemoglobin ranging between 7-8, and complains of occasional bright red blood streaks, per rectum. There was no abdominal pain, weight loss, change in bowel habits, nausea, vomiting or melena.    PHYSICAL EXAMINATION:    VITAL SIGNS: Stable. The patient is afebrile. GENERAL: NAD. ABDOMEN: Soft, nontender, nondistended. Normoactive bowel sounds.    ASA STATUS: Class II.    CONSENT:    The procedure, indications, limitations, benefits and risks, which include, but are not limited to, infection, bleeding, aspiration, perforation, allergic reactions to medications and potential for missed diagnosis, pain and death, were explained to the patient, with the help of the Sign Language Translator. Opportunity to ask questions provided and Informed Consent obtained from the patient and made part of her permanent medical record.    MEDICATIONS USED:    Fentanyl 75 mcg, Versed 3 mg.    DESCRIPTION OF PROCEDURE:    The patient was brought to the Main Endoscopy Unit at [facility] on an outpatient basis. She was placed in the left lateral decubitus position. A bite block was inserted into the mouth and oxygen provided, via nasal cannula, at two liters per minute, vital signs monitored continuously throughout the procedure and remained stable. A surgical pause was performed, followed by incremental  administration of IV conscious sedation.    An Olympus, GIF-Q180 series, diagnostic endoscope was inserted via mouth and, under direct visualization, advanced to the distal duodenum, in the usual fashion. Careful examination of mucosa performed, endoscope was withdrawn and retroflexed examination of gastric cardia and fundus performed.    The findings are, as outlined above. Distal duodenum biopsy performed to rule out sprue. Air was suctioned out of the stomach prior to the withdrawal of the endoscope.    The patient tolerated the procedure well, without immediate complications. Dr. Leona Carry was present throughout the entire viewing potion of the procedure.    IMPRESSION:    No cause of microcytic anemia identified.    RECOMMENDATION:    1. We will schedule patient for a colonoscopy. If colonoscopy unremarkable, she will need a capsule endoscopy to rule out small bowel source of anemia.  2. We will obtain upper GI to further delineate stomach anatomy. DIAGNOSIS   A. SMALL BOWEL, DUODENUM (BIOPSY):   - NORMAL VILLOUS ARCHITECTURE, NO SIGNIFICANT HISTOPATHOLOGIC CHANGE   Addendum of Dr. Lacie Draft  See Dr.Tiarra Anastacio's note for details. I have seen and examined the patient for the management of GI disease. I agree with Dr.Eloise Mula's findings and plans we developed together as written.      THIS WAS ELECTRONICALLY SIGNED - 08/29/2007 9:39 AM PST BY: Massie Kluver, MD  ENDOSCOPIST     THIS WAS ELECTRONICALLY SIGNED - 08/10/2007 11:26 PM PST BY: Thomas Hoff, MD  ASSISTANT ENDOSCOPIST   DEPARTMENT OF INTERNAL MEDICINE  IN:[id number])    D: 07/26/2007 03:55 PM  T: 07/27/2007 09:11 PM  C#: [id number]

## 2007-08-01 ENCOUNTER — Inpatient Hospital Stay: Admit: 2007-08-01 | Discharge: 2007-08-01

## 2007-09-13 ENCOUNTER — Encounter: Admitting: Gastroenterology

## 2007-09-16 ENCOUNTER — Telehealth: Payer: Self-pay | Admitting: Internal Medicine

## 2007-09-16 ENCOUNTER — Ambulatory Visit

## 2007-09-16 NOTE — Telephone Encounter (Signed)
Taking ramipril and klorcon can cause high potassium levels but her potassium has been normal, so OK to continue taking these meds together.  [facility]

## 2007-09-16 NOTE — Telephone Encounter (Signed)
Called Patient twice and phone is diconecting with the 711 service will try back later.Melinda [name]

## 2007-09-16 NOTE — Telephone Encounter (Signed)
Will try to call Patient back later.Melinda [name]

## 2007-09-16 NOTE — Telephone Encounter (Signed)
This call was conducted with [employer] Service:  Patient states that [facility] called her and told her that two of her meds can cause a dangerous interaction. The Rampiril and Klorcon are the two in questions. What should patient do? You will have to use [place] [employer] to contact patient : call [phone] then leave message on machine. She will return call tomorrow.  Melinda [name]

## 2007-09-16 NOTE — Telephone Encounter (Signed)
Hiatal hernia on UGI xray,  This is not dangerous, surgery is not necessary.  [facility]

## 2007-09-16 NOTE — Telephone Encounter (Signed)
Called Patient and line busy will try back later.Melinda [name]

## 2007-09-16 NOTE — Telephone Encounter (Signed)
This call was through [facility]. Pt states she had test done on 08-01-08 and was told her stomach was blocked by a huge intestine. Pt is  asking if MD has reviewed these test? Pt would like to know if this is dangerous and if surgery should be a option?

## 2007-09-17 NOTE — Telephone Encounter (Signed)
Called pt. left message for pt. to call back  Latrina Guttman MA

## 2007-09-17 NOTE — Telephone Encounter (Signed)
Called pt. left message for pt. to call back  Kyrstyn Greear MA

## 2007-09-17 NOTE — Telephone Encounter (Signed)
Called pt. Left mess for pt to call back. Rohith Fauth MA

## 2007-09-17 NOTE — Telephone Encounter (Signed)
Called pt again.  Left mess for pt to call back. [name] MA

## 2007-09-17 NOTE — Telephone Encounter (Signed)
Gave MD's message to pt, and pt verbalized understanding, pt says she will call back tomorrow to request a generic medication.

## 2007-09-20 ENCOUNTER — Encounter: Admitting: Gastroenterology

## 2007-09-20 NOTE — Telephone Encounter (Signed)
Pt is not returning our call. [name] MA

## 2007-09-24 ENCOUNTER — Telehealth: Payer: Self-pay | Admitting: Internal Medicine

## 2007-09-24 NOTE — Telephone Encounter (Signed)
Patient calling requesting GENERIC for Aciphex, Detrol LA and Ambien. If NO generics something similar.per pt [facility] had informed her that Aciphex has a generic,but [facility] does not have it. Please send new rx to [facility]. Please ask operator to leave msg on her machine.

## 2007-09-25 NOTE — Telephone Encounter (Signed)
When I tried to call pt, she was not avaible to answer her phone, Left detail message with telephone operator for pt, pt should call back with any question.

## 2007-09-25 NOTE — Telephone Encounter (Signed)
1. She is on ambien CR, no generic available.  I can change her to generic ambien, not CR, but she has tried this before and preferred the ambien CR.  Does she want generic ambien?    2. Aciphex can be changed to generic omeprazole.    3.  Detrol LA does not have generic,  I recommend she continue on Detrol LA.  [facility]

## 2007-09-26 ENCOUNTER — Other Ambulatory Visit: Payer: Self-pay | Admitting: Internal Medicine

## 2007-09-26 ENCOUNTER — Encounter: Admitting: Gastroenterology

## 2007-09-26 MED ORDER — OMEPRAZOLE 20 MG CAPSULE,DELAYED RELEASE
DELAYED_RELEASE_CAPSULE | ORAL | Status: DC
Start: 2007-09-26 — End: 2007-09-26

## 2007-09-26 MED ORDER — POTASSIUM CHLORIDE ER 10 MEQ TABLET,EXTENDED RELEASE
EXTENDED_RELEASE_TABLET | ORAL | Status: DC
Start: 2007-09-26 — End: 2007-10-23

## 2007-09-26 MED ORDER — AMBIEN 10 MG TABLET
ORAL_TABLET | ORAL | Status: DC
Start: 2007-09-26 — End: 2007-10-02

## 2007-09-26 MED ORDER — OMEPRAZOLE 20 MG CAPSULE,DELAYED RELEASE
DELAYED_RELEASE_CAPSULE | ORAL | Status: DC
Start: 2007-09-26 — End: 2008-09-23

## 2007-09-26 MED ORDER — POTASSIUM CHLORIDE ER 10 MEQ TABLET,EXTENDED RELEASE
EXTENDED_RELEASE_TABLET | ORAL | Status: AC
Start: 2007-09-26 — End: 2007-12-06

## 2007-09-26 MED ORDER — LASIX 20 MG TABLET
ORAL_TABLET | ORAL | Status: AC
Start: 2007-09-26 — End: 2007-12-06

## 2007-09-26 MED ORDER — LASIX 20 MG TABLET
ORAL_TABLET | ORAL | Status: DC
Start: 2007-09-26 — End: 2007-10-23

## 2007-09-26 NOTE — Telephone Encounter (Signed)
Rx's faxed to [facility] today.Melinda Taylor Ma

## 2007-09-26 NOTE — Telephone Encounter (Signed)
faxed from pharmacy  last refilled:08/21/07  Melinda [name] L. Devin Ganaway

## 2007-09-26 NOTE — Telephone Encounter (Signed)
Pt is requesting to have the generic for ambien, and aciphex.  Pt also request medications above faxed to:    [facility]  ID# [id number]  Phone# [phone]  Fax# [phone]

## 2007-09-26 NOTE — Telephone Encounter (Signed)
Left another message for pt to call back.

## 2007-09-26 NOTE — Telephone Encounter (Signed)
rx written. Please fax.  JH

## 2007-10-02 ENCOUNTER — Telehealth: Payer: Self-pay | Admitting: Internal Medicine

## 2007-10-02 ENCOUNTER — Encounter: Admitting: Gastroenterology

## 2007-10-02 MED ORDER — ZOLPIDEM 10 MG TABLET
ORAL_TABLET | ORAL | Status: DC
Start: 2007-10-02 — End: 2008-01-02

## 2007-10-02 NOTE — Telephone Encounter (Addendum)
1. Dr Gregary Cromer, Pt calling through [employer] [employer] states [facility] instructed her that Rx Evista is a NAME BRAND med so her co-pay will be more expensive, and Pt would like to know if it's possible to request a cheaper med that is Leafy Ro that will help streghten her bones, send a 90 DAY SUPPLY with 3 REFILLS to be sent to [facility]. Thanks.    2. PT WOULD LIKE TO KNOW IF SHE CAN HAVE A GLASS OF RED WINE WITH EVERY DINNER? PLEASE ADVISE. THANKS.      FYI:  PT ALSO WOULD LIKE TO LET YOU KNOW THAT IT'S SNOWING THERE, EUGENE IS DIGGING UP SNOW.

## 2007-10-02 NOTE — Telephone Encounter (Signed)
1. There is no generic equivalent for Evista. She should continue taking evista.    2. Ok to drink one glass of red wine with dinner.    3. Rx ambien 10 mg, see tele encounter for same day, fax to [facility]  [facility]

## 2007-10-02 NOTE — Telephone Encounter (Signed)
Pt calling through [employer] states that [facility] instructed pt to call her PCP to request a PA for GENERIC BRAND Zolpidem Sultrate 10 mg for a 90 day supply with 3 refill to be sent to her insurance. Thanks.

## 2007-10-03 NOTE — Telephone Encounter (Signed)
Operator [phone] has left a message for pt asking for a call back for message from DR. RX has been faxed. [name] MA

## 2007-10-03 NOTE — Telephone Encounter (Signed)
Pt calling through [employer] states [facility] instructed pt to call her PCP, request his office to call [phone] for PA on Rx Zolpidem. Pt was given the below message, and verb understanding. Thank You.

## 2007-10-07 NOTE — Telephone Encounter (Signed)
Pa faxed. Delorese Sellin MA

## 2007-10-07 NOTE — Telephone Encounter (Signed)
PA completed. please fax. JH

## 2007-10-14 ENCOUNTER — Ambulatory Visit

## 2007-10-16 ENCOUNTER — Encounter: Admitting: Internal Medicine

## 2007-10-23 ENCOUNTER — Ambulatory Visit: Admitting: Internal Medicine

## 2007-10-23 ENCOUNTER — Encounter: Admitting: Internal Medicine

## 2007-10-23 VITALS — BP 132/72 | HR 76 | Temp 96.4°F | Resp 18 | Wt 219.4 lb

## 2007-10-23 NOTE — Progress Notes (Signed)
Chief Complaint   Patient presents with    Follow Up With Specialist         Subjective: Melinda [name] is a(n) 71yr old female who presents for follow up of hypertension.    Current symptoms are none.  She has been treated with meds for 4 month(s), and states she takes medications regularly. Side effects noted are none; which are well tolerated.      Patient has not been checking ambulatory blood pressure.  Dyslipidemia: She has no regular exercise program and describes her diet as low fat/low cholesterol.   Last lipid panel shows LDL at goal and HDL at goal.  She is taking a statin.  H/o osteoporosis on statin.  H/o insomnia on [place].  C/o mole on right face x weeks, no pain, dc, pus, blood.    ROS:    Constitutional: negative.  Eyes: negative.  Ears, Nose, Mouth, Throat: negative.  CV: negative.  Resp: negative.  GI: negative.  GU: negative.      History:  I did review patient's past medical and family/social history, no changes noted.    Objective:      General Appearance: skin warm, dry, and pink, cooperative.  Eyes:  conjunctivae and corneas clear. PERRL, EOM's intact. sclerae normal.  Neck:  Neck supple. No adenopathy, thyroid symmetric, normal size.  Heart:  normal rate and regular rhythm, no murmurs, clicks, or gallops.  Lungs: clear to auscultation.  Extremities:  no cyanosis, clubbing, or edema.  Skin:  Skin color, texture, turgor normal. 3 mm dry scaly papule right face.  Neuro: Gait normal. Reflexes normal and symmetric. Sensation and strength grossly normal.  Mental Status: Appearance/Cooperation: in no apparent distress, alert, afebrile, cooperative and well hydrated      Assessment: Essential hypertension adequately controlled.  See Diagnoses Section.    Plan: See Orders.  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    See Patient Education and Orders Sections.      Barriers to Learning assessed: none. Patient verbalizes understanding of teaching and instructions.

## 2007-10-23 NOTE — Nursing Note (Signed)
>>   Jason Fila, MA     Wed Oct 23, 2007  2:36 PM  Patient has been positively identified by date of birth and last name. Vitals taken, screened for pain,  pharmacy  and allergies have been reviewd .  Donzetta Kohut, MA I.

## 2007-10-23 NOTE — Patient Instructions (Signed)
Please do fasting blood tests prior to next appointment.

## 2007-11-05 ENCOUNTER — Ambulatory Visit

## 2007-11-06 ENCOUNTER — Ambulatory Visit: Admitting: Gastroenterology

## 2007-11-06 VITALS — BP 142/73 | HR 62 | Temp 98.8°F | Resp 12 | Ht 65.0 in | Wt 219.2 lb

## 2007-11-06 NOTE — Nursing Note (Signed)
>>   Walker Kehr, MA     Wed Nov 06, 2007  2:47 PM  Patient identifiers obtained. vital signs taken, screened for pain, allergies checked, patient roomed, and doctor notified.   Melinda [name], [place]

## 2007-11-11 NOTE — Progress Notes (Addendum)
November 06, 2007 RE: [name], [name]    MR#: [id number]   DOB: December 10, 1935   Date of Service: 11/06/2007       [name], MD  INTERNAL MEDICINE  [facility]  [address]  [place] [address]      Dear Dr. Gregary Cromer:    We saw Melinda [name] today in the [facility] for followup of her microcytic anemia. Melinda [name] has undergone a colonoscopy on May 11, 2005, and was found to have diverticulosis of the sigmoid colon. There were no polyps noted and small internal hemorrhoids. She also underwent an EGD at that time and was found to have a huge hiatal hernia with [name] ulcers secondary to the stress of her hernia moving back and forth through the diaphragmatic opening. This was felt to be the cause of her microcytic anemia.    She again underwent an EGD on July 26, 2007, and again was found to have a huge hiatal hernia with approximately 40% of her stomach herniating above her diaphragm. Cameron ulcers were again documented. On August 01, 2007, she underwent an upper GI, which showed the large hiatal hernia, which was easily reduced by standing. There was no evidence of incarceration. She has been treated with omeprazole 20 mg b.i.d. and, with the omeprazole, has had much less in the way of substernal burning and pain.    On physical examination, her blood pressure was 142/73, her respiratory rate was 12, pulse was 62, and temperature was 98.8. She was 219.2 pounds in weight. She was a healthy-appearing 72 year old female in no acute distress. Her abdomen was soft. There was an umbilical hernia, which has been previously repaired. There was slight tenderness in the left lower quadrant on deep palpation. She has no epigastric tenderness. Her bowel sounds are normal. There was 2+ pretibial edema.    We have continued Melinda [name] on the omeprazole and have redrawn a CBC and a chem panel today. We have advised her caretaker that, if she remains significantly anemic,  we should increase the amount of anti-secretory medication such as adding Pepcid 40 mg at bedtime and we would also consider performing a third EGD looking at her [name] ulcers.    Thank you very much for allowing me to participate in Melinda [name]'s care.    Sincerely,      Massie Kluver, MD  CLINICAL PROFESSOR  DIVISION OF GASTROENTEROLOGY  DEPARTMENT OF INTERNAL MEDICINE  THIS WAS ELECTRONICALLY SIGNED - 11/11/2007 11:13 AM PST BY:                   JYN:[id number])    D: 11/06/2007 04:30 PM  T: 11/07/2007 05:01 PM  C#: [id number]

## 2007-11-15 ENCOUNTER — Inpatient Hospital Stay: Admit: 2007-11-15 | Discharge: 2007-11-15 | Admitting: Gastroenterology

## 2007-11-15 ENCOUNTER — Encounter: Admitting: Gastroenterology

## 2007-11-26 ENCOUNTER — Ambulatory Visit

## 2007-12-04 ENCOUNTER — Ambulatory Visit: Admitting: OBSTETRICS/GYN

## 2007-12-04 VITALS — BP 119/71 | HR 57 | Wt 217.6 lb

## 2007-12-04 NOTE — Nursing Note (Signed)
>>   Melinda [name]     Wed Dec 04, 2007  2:44 PM  Vital signs taken, allergies verified, pharmacy verified and screened for pain.    [place] of [place] [employer] information given to the patient.     Choua Ila Mcgill MA II

## 2007-12-04 NOTE — Progress Notes (Signed)
Dear Doctor Hosoume:    I had the pleasure of seeing your patient Melinda [name] in the Women[facility] in the [facility] Department of Obstetrics and Gynecology.  She was triaged to our clinic because of her multiple medical problems and her complaints of incontinence.  This is a multidisciplinary clinic where patients are evaluated by a Generalist, a Theatre manager and other specialists as needed.  As a Writer, my role was to review your patient's medical history, perform a physical exam, determine her risk of heart disease, confirm that her immunizations and recommended screening tests are current and discuss disease prevention strategies.  Customarily, patients are seen by a gynecologist directly after their visit with me.  Unfortunately, this couldn't be done because of scheduling problems with the sign language interpreter.  For this reason, the patient was appointed to see Urogynecologist Dr. Oval Linsey, on a later date.     History of Present Illness: This 72yr year old deaf woman did not have a sign language interpreter until our visit was almost over.  Thus, this visit wasn't quite as comprehensive as is usually the case.  Today she and I discussed her bone health, risk of cardiovascular disease, and her obesity. Most of this "discussion" involved notes written back and forth. Her need for a WWE and evaluation of persistent incontinence, was deferred to her future visit with Dr. Olegario Messier.    Past Medical Hisory: Past Medical History   Diagnosis Date    Unspecified Essential Hypertension     Personal History of Peptic Ulcer Disease     Unspecified Circulatory System Disorder     Abnormal Coagulation Profile     Personal History of Venous Thrombosis and Embolism     Glaucoma      She had a thigh DVT within the last few years.  She was taking a SREM at that time and still takes it  Takles medication for dyslipidemia  Recently diagnosed anemia, presumably UGI loss, PPI dose  recently increased and FeSO4 started.  Ongoing evaluation per GI Lacie Draft). Upper endoscopy: no ulcer, large hiatal hernia. UGI: no relux.    Past Surgical History: Past Surgical History   Procedure Date    Remv 2nd cataract,corn-scler sectn 2004     Cataract removal     Goiter removed age 72 years    Medications:  Current outpatient prescriptions   Medication Sig Dispense Refill    ZOLPIDEM 10 MG TAB one po qhs  90  3    OMEPRAZOLE 20 MG CAP, DELAYED RELEASE take 1 capsule by mouth twice daily  180  4    LASIX 20 MG TAB 1 qam  90  3    POTASSIUM CHLORIDE SR 10 MEQ TAB one daily  90  3    METOPROLOL 50 MG TAB one tab po twice daily  180  4    ZOCOR 20 MG TAB one po at bedtime  90  4    DETROL LA 4 MG 24 HR CAP one po daily  90  4    ALTACE 10 MG CAP 1 po daily  90  4    EVISTA 60 MG TAB 1 tablet daily  90  3    FERROUS SULFATE 325 MG (65 MG IRON) TAB take 1 tablet three times daily by mouth  270  3    ACIPHEX 20 MG TAB take two daily  180  4    TOPROL XL 100 MG 24 HR TAB 1  daily  90  4     Medications listed are current    OTC: She takes an unknown Calcium/Mg/Vitamin D formulation in an unknown dosage, " salmon pills" and multivitamin.  No aspirin.     Allergies: Sulfa (sulfonamides), Novocain and Clindamycin    Family History:      Heart Disease - Brother had a heart attack in his 58's (a smoker), Dad when he was 62   Stroke Mom- multiple strokes age 58's    Diabetes - MGGM   Cancer - mom CA ? Had "stomach problems" at end of her life   Osteoporosis/Fragility Fracture - no hip fx. No known osteoporosis          Social History/Lifestyle:      Social support: married X 50 years     Substances used:  Tobacco no                           Alcohol no     Diet:   Trying to lose weight? No, not worried about her weight. Her parents were big too.  She says that's just how it is in her family...Marland KitchenMarland Kitchen                Trying to eat a low fat diet?  Answers "yes", but drinks 1% milk & eats a lot of cheese. Also eats  meat at least daily.  I'm not sure that she understands the meaning of a "low fat diet..."              Most of her meals are provided by senior center. Before this, she admits that her diet was lacking in vegetables              Dairy servings per day 3-4 glasses milk              Vegetable servings per day ?              Fruit servings per day ?     Exercise:  Walks at a leisurely pace about 1 hour inside the grocery store. She's a bit vague regarding frequency of exercise, despite repeated questioning by me..    Review of Systems:     Constitutional: fatigue, improved since starting iron therapy     Integumentary: Skin tag on right cheek, says she has upcoming appointment with dermatology     Eyes:  negative     ENT: deaf, has dry mouth and dry nose (lives in [place] and takes [place]), has dentures     Respiratory: coughs up a bit of mucus, otherwise negative     Cardiovascular:  skeletal discomfort when lying flat, ankle swelling (takes lasix)     GI: heartburn, known large hiatal hernia but no ulceration per recent double contrast UGI     Musculoskeletal:  traumatic fracture following a fall 6 years ago, has scoliosis     Neurological: negative         Health Care Maintenance:     Immunizations: Immunization History   Name Date(s) Administered    Influenza Vaccine (Fluarix) 05/15/2007    Influenza Virus Vaccine, Split (0.5 Ml) 06/26/2005    Influenza Virus Vaccine, Whole 05/25/2004     Had a pneumonia vaccine about 10 years ago      Disease Screening:               Glucose 79  LipidsResults (on Simvastatin)    Ref. Range 05/10/2007 4:11:00 PM 05/15/2007 3:47:00 PM   CHOLESTEROL Latest Range: 0-200 mg/dL 161    TRIGLYCERIDE Latest Range: 35-160 mg/dL 096    LDL CHOLESTEROL CALCULATION Latest Range: < 130 mg/dL 93    HDL CHOLESTEROL Latest Range: >= 35 mg/dL 42    NON-HDL CHOLESTEROL Latest Range: 0-150 mg/dl 045                   Colorectal cancer- colonoscopy 2006 (revealed  tics)     Physical Exam:     Vital signs: BP 119/71  Pulse 57  Wt 98.703 kg (217 lb 9.6 oz)  LMP Postmenopausal    BMI: There is no height on file for this encounter. Patient states that her height is 67 inches, down from 68.5 inches.  Loss of height probably has more to do with scoliosis than loss of vertebral height. Calculated BMI based on prior ht 32.6   General Appearance: healthy, alert, no distress, pleasant affect, cooperative, obese.   Ears:  deaf.   Neck:  No adenopathy, thyroid symmetric, normal size   Chest: thoracoscoliosis.   Heart:  Distant. normal rate and regular rhythm, no murmurs, clicks, or gallops.   Lungs: clear to auscultation.   Abdomen: Obese. Abdomen soft, non-tender.  No masses or organomegaly.   Skin:  Skin tag Rt check and anterior chest. No worrisome lesions.   Extremities: thick ankles, tender to pretibial palpation, trace edema    Screening Instruments:    Framingham Risk: 10 year risk of heart disease is 2 %    Despite low Framingham score, patient is At Intermediate Risk for cardiovascular disease given her multiple risk factors including central obesity, dyslipidemia on statin with persistently low HDL, HTN, family history (brother in his early 80's had MI)    FRAX score:  10 year risk of osteoporotic fracture: 21%                         10 year risk of hip fracture 2.4%    Laboratory Review:     CBC 10/08:    HEMOGLOBIN Latest Range: 12.0-16.0 GM/DL 8.8 (L)   HEMATOCRIT Latest Range: 36-46 % 27.6 (L)   MCV Latest Range: 80-100 UM3 75.7 (L)   MCH Latest Range: 27-33 PG 24.1 (L)   MCHC Latest Range: 32-36 % 31.8 (L)   RDW Latest Range: 0-14.7 UNITS 27.8 (H)       Upper endoscopy 08/2007: Large hiatal hernia, no mucosal lesions, no ulcer, no bleeding source identified    Assessment and Recommendations:    1) Anemia, iron deficiency  Comment: ongoing w/u per GI Med. Subjectively improved with iron supplements  Plan: Per Dr. Lacie Draft    2) S/P Thigh DVT  Comment: Patient was taking  Selective Estrogen Receptor Modulator (SERM) Evista at the time of this diagnosis.  SERMs are known to increase the risk of venous thrombosis and are contraindicated in persons with a history of thromboembolism.  Plan: recommend alternative Rx for osteoporosis as below    3) Obesity  Comment: Patient does not consider her weight to be a problem, nor is she interested in adjusting her diet to promote weight loss or increasing her exercise program.  This is a conclusion I came to after about 10 minutes of patient education through a capable sign language interpreter.  Plan: continue to encourage low fat diet and increased exercise at the very least.  4) Skin tags  Comment: not worrisome for cancer  Plan: derma evaluation as previously scheduled    5)  Osteoporosis  Comment: discussed below    Preventive Issues:    1.)  Cardiovascular risk assessment and treatment recommendations:  Based on her Framingham Risk Score, your patient is in the lowest risk category for coronary heart disease.  Based on the 2007 American Heart Association classification scheme which is more sensitive in determining disease risk of women, your patient is at intermediate risk for cardiovascular disease.  To reduce her risk I recommend increasing her physical activity to at least 30 minutes of walking most days of the week, continuing antihypertensive and statin Rx, consider increasing statin to lower her LDL to 70 if tolerated and/or adding fibrate to address her low HDL, encouraging her to follow a "heart healthy"  diet (or at least a low fat diet) and instituting ASA 72-162mg  once her GI bleeding source has been determined, pending approval of her gastroenterologist Dr. Lacie Draft.    2.) Cancer risk, prevention and screening recommendations: Risk and screening recommendations regarding breast and genital cancer will be addressed by Dr. Olegario Messier at the patient's scheduled [facility]-urogynecology visit.  With respect to early diagnosis of colorectal  cancer she has had a recent colonoscopy. I provided printed educational materials for your patient on the importance of skin cancer prevention by means of sun avoidance, conscientious sunscreen use, wearing brimmed hats and protective clothing.      3)  Osteoporosis risk, prevention and screening recommendations: The patient's FRAX score suggests the need for antiresorptive therapy. SERM medications such as Evista are relatively contraindicated given her prior history of venous thrombosis.  Also, Evista is not quite as effective as the bisphosphonates in reducing the risk of fracture.  I assume that bisphosphonates are not contraindicated given the lack of mucosal lesions on her last upper endoscopy.  However, given her GERD symptoms, she'd probably do better on the monthly ibandronate. I recommend supplementing her calcium rich diet (diet estimated to provide 1000mg  calcium daily) with 500 mg elemental calcium daily.  This should be in the form of calcium citrate since she is taking a proton pump inhibitor. I also recommend Vitamin D supplementation to insure a daily intake of 1000 IU.  She drinks quite a bit of milk, presumably vitamin D fortified.  I ordered a 25-OH Vitamin D level to exclude Vitamin D deficiency since the possibility of celiac disease was raised by Dr. Lacie Draft in his most recent note. This recommendation was discussed with the patient and printed material provided regarding the different calcium/vitamin D supplements that are available without a prescription. She was encouraged to engage in regular weight bearing exercise, although I doubt that she will comply.      4)     Immunization status and recommendations: she is due for a second pneumovax and should be offered a Zoster vaccination.  She may need a tetanus vax as there is no EMR evidence that one has been administered recently.  I recommend the Tdap vaccine if it has been more than 10 years since her last tetanus.    I educated the  patient regarding all aspects of the above assessment and she indicated understanding with the treatment plan.  I spent 60 minutes with your patient of which 35 minutes was spent counseling her on the above issues.    It was my pleasure to participate in the care of your patient.  If you have any questions, please contact  me at the [facility] where the telephone number is [phone].    Sincerely,    Della Goo, M.D.

## 2007-12-06 DIAGNOSIS — E669 Obesity, unspecified: Secondary | ICD-10-CM | POA: Insufficient documentation

## 2007-12-09 NOTE — Progress Notes (Signed)
Provided pt with [facility] packet covering osteoporosis, calcium supplements, heart health, herbal supplements, hormone therapy, mammograms, pap smear/pelvic exam, pelvic floor exercises, stress reduction, urinary incontinence and peri/meno/postmenopausal information. Pt with sign language interpreter. Via interpreter pt states understanding of information discussed. All written materials given were reviewed with interpreter in the room. Learning needs were assessed and via interpreter pt denies additional questions. Provided pt with A/O ph number and encouraged pt to use TTY if any questions or concerns.

## 2007-12-10 ENCOUNTER — Telehealth: Payer: Self-pay | Admitting: Internal Medicine

## 2007-12-10 NOTE — Telephone Encounter (Signed)
Called [phone] and left message below for Patient to receive on machine.Melinda [name]

## 2007-12-10 NOTE — Telephone Encounter (Signed)
Ok to start ASA 81 mg once a day .

## 2007-12-10 NOTE — Telephone Encounter (Signed)
Patient calling stated that she is leaving for a trip/visit [facility] tomorrow and would like to know if pt can start aspirin therapy now? DR at OB/GYN is suppose to talk to Dr Gregary Cromer about this and changing Evista to Fosamax when  She returns from her trip. Pt actually took baby aspirin last night and the noticed that leg swelling,lessen. Also would like to know if she does start her baby aspirin therapy will this be compatible with all her medications and vitamins she is on? Please advise.

## 2007-12-25 ENCOUNTER — Encounter: Admitting: OBSTETRICS/GYN

## 2008-01-01 ENCOUNTER — Other Ambulatory Visit: Payer: Self-pay | Admitting: Internal Medicine

## 2008-01-01 NOTE — Telephone Encounter (Signed)
Per pt's fax: requests Ramipril 10mg , metoprolol suddianate 100mg  and

## 2008-01-02 ENCOUNTER — Telehealth: Payer: Self-pay | Admitting: Internal Medicine

## 2008-01-02 MED ORDER — RAMIPRIL 10 MG CAPSULE
10.0000 mg | ORAL_CAPSULE | Freq: Every day | ORAL | Status: DC
Start: 2008-01-02 — End: 2008-03-18

## 2008-01-02 MED ORDER — METOPROLOL TARTRATE 50 MG TABLET
1.0000 | ORAL_TABLET | Freq: Two times a day (BID) | ORAL | Status: DC
Start: 2008-01-02 — End: 2008-01-15

## 2008-01-02 MED ORDER — ZOLPIDEM 10 MG TABLET
1.0000 | ORAL_TABLET | Freq: Every day | ORAL | Status: DC
Start: 2008-01-02 — End: 2008-01-03

## 2008-01-02 NOTE — Telephone Encounter (Signed)
Rx written, mailed to patient, see encounter 5/21.

## 2008-01-02 NOTE — Telephone Encounter (Addendum)
Rx mailed to Patient today .Melinda [name]

## 2008-01-02 NOTE — Telephone Encounter (Signed)
Pt is requesting to have a hard copy of above med's with 90- day supply and 3 refills sent to     [address]   [place] [address]

## 2008-01-02 NOTE — Telephone Encounter (Signed)
Rx written. Please mail to patient.  JH

## 2008-01-03 ENCOUNTER — Other Ambulatory Visit: Payer: Self-pay | Admitting: Internal Medicine

## 2008-01-03 NOTE — Telephone Encounter (Signed)
This is fax request about Metoprolol and Zolpidem from [facility] [facility] Fax # [phone] and original copy at your station.    Cindie Crumbly MA

## 2008-01-03 NOTE — Telephone Encounter (Signed)
Is she taking Toprol XL 100 mg once daily or Metoprolol 50 mg twice daily?  Call the patient and ask her.  See the scripts in your box.

## 2008-01-10 NOTE — Telephone Encounter (Signed)
Phoned patient undable to leave message no answering machine. Will try back later.   Donzetta Kohut, MA I.

## 2008-01-14 NOTE — Telephone Encounter (Signed)
Phoned patient through relay service , no answer , will try back later.   Donzetta Kohut, MA I.

## 2008-01-15 MED ORDER — ZOLPIDEM 10 MG TABLET
1.0000 | ORAL_TABLET | Freq: Every day | ORAL | Status: DC
Start: 2008-01-03 — End: 2008-02-18

## 2008-01-15 MED ORDER — METOPROLOL TARTRATE 50 MG TABLET
1.0000 | ORAL_TABLET | Freq: Two times a day (BID) | ORAL | Status: DC
Start: 2008-01-15 — End: 2008-04-23

## 2008-01-15 NOTE — Telephone Encounter (Signed)
Rx written, please fax

## 2008-01-15 NOTE — Telephone Encounter (Signed)
Attempted to call patient through relay service no answer will try back later.   Donzetta Kohut, MA I.

## 2008-01-15 NOTE — Telephone Encounter (Signed)
Received from [facility] new blank rx forms will send to MD to advise.   Donzetta Kohut, MA I.

## 2008-01-16 NOTE — Telephone Encounter (Signed)
Faxed rx to [facility].  Donzetta Kohut, MA I.

## 2008-01-21 ENCOUNTER — Ambulatory Visit

## 2008-01-22 ENCOUNTER — Encounter: Admitting: Internal Medicine

## 2008-01-29 ENCOUNTER — Encounter: Admitting: OBSTETRICS/GYN

## 2008-02-12 ENCOUNTER — Ambulatory Visit: Admitting: Internal Medicine

## 2008-02-12 ENCOUNTER — Ambulatory Visit

## 2008-02-12 VITALS — BP 132/84 | HR 70 | Temp 97.4°F | Resp 16 | Wt 213.6 lb

## 2008-02-12 NOTE — Nursing Note (Signed)
>>   Jason Fila, MA     Wed Feb 12, 2008  2:17 PM  Patient has been positively identified by date of birth and last name. Vitals taken, screened for pain,  pharmacy  and allergies have been reviewd .  Donzetta Kohut, MA I.

## 2008-02-12 NOTE — Progress Notes (Signed)
Chief Complaint   Patient presents with    Test Results     3 mo f/u    Leg Pain     left leg calf x 3 days     Fainting     in grocery store 02/10/08 pt thinks due to heat          Subjective: Melinda [name] is a(n) 72yr old female who presents for follow up of hypertension.    Current symptoms are none.  She has been treated with meds for 6 month(s), and states she takes medications regularly. Side effects noted are none; which are not applicable.  She was very light headed 2 days ago at grocery store, nearly fainted, very hot that day, feels fine today.    Patient has not been checking ambulatory blood pressure.  H/o Hypercholesterolemia, on statin.  C/o left calf pain x 3 d, mild, no swelling, red, warmth. H/o DVT.    ROS:    Constitutional: fatigue.  Eyes: negative.  Ears, Nose, Mouth, Throat: negative.  CV: negative.  Resp: negative.  GI: negative.  GU: negative.      History:  I did review patient's past medical and family/social history, no changes noted.    Objective:      General Appearance: skin warm, dry, and pink, cooperative.  Neck:  Neck supple. No adenopathy, thyroid symmetric, normal size.  Heart:  normal rate and regular rhythm, no murmurs, clicks, or gallops.  Lungs: clear to auscultation.  Extremities:  no cyanosis, clubbing, or edema and calves NT, no red, warmth, swelling, cords.  Skin:  Skin color, texture, turgor normal. No rashes or lesions.      Assessment: Essential hypertension adequately controlled.  See Diagnoses Section.    Plan: See Orders.  1)  Medication: continue current medication regimen unchanged  2)  Dietary sodium restriction  3)  Regular aerobic exercise    401.9 HYPERTENSION NOS  (primary encounter diagnosis)  Comment:   Plan: COMPREHENSIVE METABOLIC PANEL            530.81K GERD (Gastroesophageal Reflux Disease)  Comment:   Plan: CBC AUTO + REFLEX MANUAL DIFF            272.4 HYPERLIPIDEMIA NEC/NOS  Comment:   Plan: COMPREHENSIVE METABOLIC PANEL, LIPID PANEL WITH         DLDL REFLEX            733.00 OSTEOPOROSIS NOS  Comment:   Plan: CBC AUTO + REFLEX MANUAL DIFF, TSH WITH FREE T4        REFLEX            389.9 HEARING LOSS NOS  Comment: sign interpreter present for last 10 min of visit.  Plan:       Patient Instruction:  Reviewed risks of hypertension and principles of treatment.    See Patient Education and Orders Sections.      Barriers to Learning assessed: none. Patient verbalizes understanding of teaching and instructions.

## 2008-02-13 ENCOUNTER — Ambulatory Visit: Admitting: Dermatology

## 2008-02-13 NOTE — Nursing Note (Signed)
>>   [facility]     Thu Feb 13, 2008  1:24 PM  Vitals signs taken, allergies verified, screened for pain, meds verified,pharmacy verified. Doreatha Lew, MA II

## 2008-02-13 NOTE — Progress Notes (Signed)
Dear Dr. [name], M.D., MD,     Thank you very much for this consultation. We had the pleasure of seeing your patient, Melinda [name], on 02/13/2008 in dermatology clinic.    HPI:  As you know, Melinda [name] is a 72yr old female here for evaluation and treatment of skin lesions with suspicion for cancer.  She has had a purple bump on her right temple which has been slowly growing for about 3 years.  On her right cheek, she has a small bump which she has cut off but has since regrown in the past month.  She is also concerned about several brown bumps in her inframammary area bilaterally as well as on her neck bilaterally and one on her left frontal scalp.    Review of systems: Patient questionnaire dated 02/13/2008 reviewed. Education needs were identified. Patient is deaf but had sign interpreter present during visit.    Past Medical History: reviewed as above.    Family History: No family hiistory of skin cancer    Social History: She has not had any skin cancers but did have sunburns as a child living in [place].    Allergies: Sulfa (sulfonamides), Novocain and Clindamycin     Medications: Zolpidem (AMBIEN) 10 mg PO Tablet, Take 1 Tab by mouth every day at bedtime.   Metoprolol (LOPRESSOR) 50 mg PO Tablet, Take 1 Tab by mouth 2 times daily.   Ramipril (ALTACE) 10 mg PO Capsule, Take 1 Cap by mouth every day.   OMEPRAZOLE 20 MG CAP, DELAYED RELEASE, take 1 capsule by mouth twice daily  ZOCOR 20 MG TAB, one po at bedtime  DETROL LA 4 MG 24 HR CAP, one po daily  EVISTA 60 MG TAB, 1 tablet daily  ACIPHEX 20 MG TAB, take two daily    Physical Examination:   On physical examination, the patient is in no apparent distress and breathing comfortably.  Vital signs were noted.  The patient is well nourished and pleasant.  The patient is alert to time, place and person.  Inspection of the head, face, neck, chest, back including spine, abdomen, and right and left upper  extremities was performed. Patient declined  inspection of her buttocks and lower extremities.    The following abnormalities were noted:  On the right temple, there is a 1 cm diameter oval red-purple blanching papule.  There are also a dozen red blanchable papules spread diffusely on the face and scalp.  On the face, neck, back, and chest, there are numerous brown or black stuck-on appearing papules.  On her right cheek, there is a 2mm skin colored papule.  The rest of the exam was within normal limits.    Assessment and Plan:  1) Cherry angioma  Lesion on right temple appears to be cherry angioma.  These are benign and do not need to be excised.    2) Seborrheic keratoses  Patient has multiple SKs on face, neck, scalp, chest and back. The inflamed ones that were irritating her on her face and neck were removed with liquid nitrogen.    We have asked Melinda [name] to return to clinic as needed.    Thank you for the opportunity to participate in this patient's care. Please contact us if there are any questions or if we may be of additional assistance.     This patient was seen and discussed with my attending, Dr. Terrilyn Saver who agrees with the current plan.    Pollyann Samples, MD

## 2008-02-18 ENCOUNTER — Other Ambulatory Visit: Payer: Self-pay | Admitting: Internal Medicine

## 2008-02-18 MED ORDER — ZOLPIDEM 10 MG TABLET
1.0000 | ORAL_TABLET | Freq: Every day | ORAL | Status: DC
Start: 2008-02-18 — End: 2008-03-12

## 2008-02-18 NOTE — Telephone Encounter (Signed)
Received a fax from pharmacy , for a new rx for ambein 10 mg , called patient via relay health patient states [facility] would not ship the ambein to [place] , did not believe patient was there , she called [facility] and they will ship the [place] out on 03/04/08 to be received by 03/12/08 , patient is currently out of the medication , and is requesting a 30 day supply go to the local pharmacy selected .   Donzetta Kohut, MA I.

## 2008-02-18 NOTE — Progress Notes (Signed)
I was present with the resident during the history and exam.  I discussed the case with the resident and agree with the assessment and plan we developed as documented in the resident's note.      Report electronically signed by BARBARA A BURRALL, M.D., MD.

## 2008-02-19 ENCOUNTER — Telehealth: Payer: Self-pay | Admitting: Internal Medicine

## 2008-02-19 LAB — CBC WITH DIFFERENTIAL
Basophils % Auto: 0.3 %
Basophils Abs Auto: 0 K/MM3 (ref 0–0.2)
Eosinophils % Auto: 1.7 %
Eosinophils Abs Auto: 0.1 K/MM3 (ref 0–0.5)
Hematocrit: 42.7 % (ref 36–46)
Hemoglobin: 14.1 g/dL (ref 12.0–16.0)
Lymphocytes % Auto: 33.9 %
Lymphocytes Abs Auto: 1.7 K/MM3 (ref 1.0–4.8)
MCH: 31.2 pg (ref 27–33)
MCHC: 33 % (ref 32–36)
MCV: 94.4 UM3 (ref 80–100)
MPV: 8.9 UM3 (ref 6.8–10.0)
Monocytes % Auto: 7.2 %
Monocytes Abs Auto: 0.4 K/MM3 (ref 0.1–0.8)
Neutrophils % Auto: 56.9 %
Neutrophils Abs Auto: 2.9 K/MM3 (ref 1.80–7.70)
Platelet Count: 202 K/MM3 (ref 130–400)
RDW: 13.7 U (ref 0–14.7)
Red Blood Cell Count: 4.52 M/MM3 (ref 4.0–5.2)
White Blood Cell Count: 5.1 K/MM3 (ref 4.5–11.0)

## 2008-02-19 LAB — LIPID PANEL WITH DLDL REFLEX
Cholesterol: 156 mg/dL (ref 0–200)
HDL Cholesterol: 33 mg/dL — ABNORMAL LOW (ref >=35)
LDL Cholesterol Calculation: 100 mg/dL (ref <130)
Non-HDL Cholesterol: 123 mg/dL (ref 0–150)
Total Cholesterol:HDL Ratio: 4.7 — ABNORMAL HIGH (ref <4.0)
Triglyceride: 117 mg/dL (ref 35–160)

## 2008-02-19 LAB — TSH WITH FREE T4 REFLEX: Thyroid Stimulating Hormone: 2.94 u[IU]/mL (ref 0.35–5.5)

## 2008-02-19 NOTE — Telephone Encounter (Signed)
pharmacy fax request  Per pharmacy, zolpidem 10mg  tab # 30 needs prior auth.  Please call [phone].   Id # [id number]. Please advise. Thanks. [name] MA

## 2008-02-19 NOTE — Telephone Encounter (Signed)
Please start PA for ambien

## 2008-02-20 NOTE — Telephone Encounter (Addendum)
Spoke with insurance co , they state the [place] is rejected do to refill too soon , unable to fill until 02/26/08. They state the patient has a quanity of 90 tabs . Emailed patient the status.   Donzetta Kohut, MA I.

## 2008-02-25 ENCOUNTER — Encounter: Payer: Self-pay | Admitting: Internal Medicine

## 2008-03-02 ENCOUNTER — Telehealth: Payer: Self-pay | Admitting: Internal Medicine

## 2008-03-02 NOTE — Telephone Encounter (Signed)
1. Pt states she has received a letter from Dr Leonidas Romberg stating she needs to have a Mammogram done. Pt is requesting to have a referral to have this done.    2. Pt also would like to know since her blood is now normal should she continue to take her Iron pills everyday? Pt would also states her OB/GYN and Aspirin Therapy wants her to take  Fish Oil. Pt is asking if its ok to take this with all her other medication?

## 2008-03-03 NOTE — Telephone Encounter (Signed)
1.  Mammo ordered.    2. Ok to dc iron supplement.    3.  Ok to take fish oil.

## 2008-03-03 NOTE — Telephone Encounter (Signed)
Called pt through relay service at [phone].  left message for pt. to call back  [name] MA

## 2008-03-04 ENCOUNTER — Encounter: Payer: Self-pay | Admitting: OBSTETRICS/GYN

## 2008-03-04 NOTE — Telephone Encounter (Signed)
Pt was given MD msg.

## 2008-03-12 ENCOUNTER — Telehealth: Payer: Self-pay | Admitting: Internal Medicine

## 2008-03-12 MED ORDER — ZOLPIDEM 10 MG TABLET
1.0000 | ORAL_TABLET | Freq: Every day | ORAL | Status: DC
Start: 2008-03-12 — End: 2008-03-18

## 2008-03-12 NOTE — Telephone Encounter (Signed)
Rx ambien to [facility].

## 2008-03-12 NOTE — Telephone Encounter (Signed)
Pt is requesting to now on get a 90 day supply of the marked above med faxed to new local pharmacy [facility].  Pt has been having a problem with [facility]. [facility] told pt that she won't be able to get this med filled until 04/09/08. Pt only have 5 pills left.  Thanks

## 2008-03-13 NOTE — Telephone Encounter (Signed)
Called [facility] back and notified pt that rx is sent.

## 2008-03-18 ENCOUNTER — Telehealth: Payer: Self-pay | Admitting: Internal Medicine

## 2008-03-18 NOTE — Telephone Encounter (Signed)
Ok to refill ambien 10 mg one po qhs, 90d with 3 refills

## 2008-03-18 NOTE — Telephone Encounter (Signed)
Spoke with Sam at [facility] to ok prescription

## 2008-03-18 NOTE — Telephone Encounter (Signed)
Sam (pharmacist) from [facility] is requesting a VERBAL ok to refill the above meds for a 90 day supply with 3 refills. Please Advise. Thanks.

## 2008-03-25 ENCOUNTER — Encounter: Admitting: OBSTETRICS/GYN

## 2008-03-25 ENCOUNTER — Ambulatory Visit

## 2008-04-01 ENCOUNTER — Other Ambulatory Visit: Payer: Self-pay | Admitting: Women's Health

## 2008-04-01 ENCOUNTER — Ambulatory Visit: Admitting: Women's Health

## 2008-04-01 VITALS — BP 147/73 | HR 62 | Resp 18 | Ht 65.0 in | Wt 214.5 lb

## 2008-04-01 NOTE — Nursing Note (Signed)
>>   [facility]     Wed Apr 01, 2008  3:12 PM  Vital signs taken, allergies verified, screened for pain. Pharmacy confirmed.  Last pap 3 years ago per patient.  The patient was given a copy of the [place] of [place] brochure "Gynecological Cancers.. What Women Need to Know".      Trixie Deis, [place]

## 2008-04-01 NOTE — Patient Instructions (Addendum)
Pessary Care:       Wash with warm soap and water       Do not flush down toilet       Remove overnight at least one night a week       Use a small amount lubricant to assist with pessary insertion       If suture placed on pessary to ease removal, replace with unflavored              dental floss when suturs shows signs of wear  Call clinic at [phone] option 1 if:       Unable to empty bladder or complete bowel movement       Pain from pessary occurs       Unable to remove, replace, or retain pessary       Pink, brown, bloody, or bothersome vaginal discharge occurs    Bring urogyn paperwork packet to next visit

## 2008-04-01 NOTE — Progress Notes (Signed)
Melinda [name] presents on 04/01/08 for her annual health screening and comprehensive evaluation.  She is a G4 P3.     Her current primary care doctor is: [name], M.D., MD.    Her current gynecologic concerns include: none    Pertinent Gynecologic History:  Current hormones or BCM - none  Currently sexually active: yes  Last episode of bleeding began: none  Last pap smear: unsure - 2-3 years ago outside [place]  History of dysplasia: no  Last mammogram: Nov 08 - 2009 scan ordered  Most recent DEXA: 2006 - osteoporosis  Most recent colonoscopy: uncertain - 1-2 years ago told it was good for 10 years  Performs SBE: no  Exercise habits - walking    Past Medical History   Diagnosis Date    Unspecified Essential Hypertension     Personal History of Peptic Ulcer Disease     Unspecified Circulatory System Disorder     Abnormal Coagulation Profile     Personal History of Venous Thrombosis and Embolism     Glaucoma          Past Surgical History   Procedure Date    Remv 2nd cataract,corn-scler sectn 2004     Cataract removal         Current outpatient prescriptions prior to encounter   Medication Sig Dispense Refill    Zolpidem (AMBIEN) 10 mg PO Tablet Take 1 Tab by mouth every day at bedtime.   90  3    Metoprolol (LOPRESSOR) 50 mg PO Tablet Take 1 Tab by mouth 2 times daily.   90-day supply  3    Ramipril (ALTACE) 10 mg PO Capsule Take 1 Cap by mouth every day.   90-day supply  3    OMEPRAZOLE 20 MG CAP, DELAYED RELEASE take 1 capsule by mouth twice daily  180  4    ZOCOR 20 MG TAB one po at bedtime  90  4    DETROL LA 4 MG 24 HR CAP one po daily  90  4    EVISTA 60 MG TAB 1 tablet daily  90  3    ACIPHEX 20 MG TAB take two daily  180  4         History   Social History    Marital Status: MARRIED     Spouse Name: Dennard Nip     Number of Children: 3    Years of Education: N/A   Occupational History    retired, Risk analyst    Social History Main Topics    Tobacco Use: Never    Alcohol Use: No     Drug Use: No    Sexually Active: Yes -- Female partner(s)   Other Topics Concern    Not on file   Social History Narrative    No narrative on file       I have reviewed this patient's past medical history, surgical history and family history as pertains to her gynecologic evaluation. Appropriate areas were updated to reflect this patient's current condition.    The following systems were reviewed with the patient in a yes/no format:    GENERAL:   Easily fatigued: no  Weight loss: no  Sleeping well: yes - with assistance from ambien  Weight gain: yes    RESPIRATORY:   Spitting up blood: no  Difficulty breathing: no  Chronic bronchitis: no  Pneumonia: no    CARDIOVASCULAR:  Chest pain or angina: no  Difficulty walking  2 blocks: no  Palpitations: no  History of heart murmur: no    GASTROINTESTINAL:  Hemorrhoids: no  Recent change in bowel habits: no  Heartburn:no - stable on meds also has hx of hiatal hernia  Bleeding with bowel movements:no  Black stools: no  Constipation: no  Diarrhea:yes - food triggers only    GENITOURINARY:  Burning with urination: no  Frequency of urination: no  Blood in urine: no  Frequent bladder/kidney: no  Problem with leaking: yes - was using pessary removed 6 months ago  Symptoms of vaginal infection: no      HEMATOLOGIC:  Anemia: yes  Blood disease:no  History of deep venous thrombosis: yes - DVT after burn to same LE    MUSCULOSKELETAL:   Painful joints: no  Fibromyalgia: no        SKIN DISEASES:  Psoriasis:no  Eczema: no  Acne: no    IMMUNOLOGIC:  H.I.V.:no  Lupus Erythematosis: no  Scleroderma:no     PSYCHIATRIC:  Anxiety: no  Depression: no  Bipolar disorder: no    HOSPITALIZATIONS :   other than childbirth yes   DVT 2-3 years ago    NEUROLOGIC:  Seizures: no  Headaches: no      PESSARY FITTING    INDICATION:   Prolapse  Urinary Incontinence    Patient had hysterectomy: no.    Pessary Fittings:  FITTED WITH  Ring, Size  3,    failed fit d/t inadequate  support.  .........................................................................................  Tried another Pessary: Incontinence Ring, Size  3,  Successful pessary fit.    Instructed on insertion and removal: yes.    Able to insert and remove: Yes.    INSTRUCTIONS:   Wash with mild soap and water  Advised to call for vaginal bleeding, pain or difficulty with urination or defecation  Printed instructions given  Remove pessary weekly overnight    General appearance:  no apparent distress.    HEENT: within normal limits; thyroid without enlargement or nodularity.  Heart: Regular rate and rhythm; no murmur.  Lungs: Clear to auscultation  Skin: Without acne or abnormal hirsuitism  Breasts: No dimpling, discharge, or masses. Normal skin color.  Abdomen: Soft, non-tender, no masses, no hepatosplenomegaly, no hernias, non-distended.    Pelvic:   Vulva - within normal limits.  Vagina - Estrogenized:  poorly  Pubic area TS - Gynecoid  Urethral meatus has no lesions  There are no suburethral masses.  There is not suburethral tenderness.  Cervix is without lesions. Pap collected  Uterus is 6 weeks sized.  Uteris is anteflexed  Uterus is mobile  Uterus is non-tender  Adnexa has a no mass.  Perineum is without lesions  Perianal area is without lesions    Psychiatric- alert and oriented x 3    Barriers to learning: none  Patient/Family understanding: verbalizes    Total time with patient was 90 minutes, of which > 50% was counseling. A sign language interpretor was used for entire encounter      ASSESSMENT:   1. ANNUAL          Reviewed and demonstrated self breast exam technique          If Pap normal repeat in 1 year, if abnormal follow ASCCP guidelines          Screening mammo already ordered by previous provider - reminded to schedule  2. Urinary incontinence   Fit with incontinence ring today   Given new urogyn paperwork   Pessary fitting and  new urogyn exam next visit    RTC 2 weeks or prn problems. Patient  verbalized understanding to information and instructions provided with assistance if sign interpretor. Vernon Prey, RN/NP  My attending physician for this encounter is Dr. Claude Manges

## 2008-04-02 ENCOUNTER — Encounter: Admitting: Women's Health

## 2008-04-06 ENCOUNTER — Encounter: Payer: Self-pay | Admitting: Women's Health

## 2008-04-15 ENCOUNTER — Ambulatory Visit

## 2008-04-15 ENCOUNTER — Ambulatory Visit: Admitting: Internal Medicine

## 2008-04-15 VITALS — BP 120/84 | HR 60 | Temp 96.9°F | Wt 214.0 lb

## 2008-04-15 NOTE — Nursing Note (Signed)
>>   Melinda [name]     Wed Apr 15, 2008  4:27 PM  Immunization VIS documentation(s) were given to Patient  to review. All questions were answered and the patient consented to the Immunization(s) being given. Patient allergies were reviewed and no contraindications were found. The immunization(s) were given as ordered. The patient was observed for any immediate reactions to the vaccine. None were observed.Melinda Ladona Ridgel Ma    >> [name]     Wed Apr 15, 2008  3:57 PM  pt vitaled and screened for pain and allergy.  Clinton Quant MA

## 2008-04-15 NOTE — Patient Instructions (Signed)
Please do fasting blood tests prior to next appointment.

## 2008-04-16 NOTE — Progress Notes (Signed)
Chief Complaint   Patient presents with    General Exam         Subjective: Melinda [name] is a(n) 72yr old female who presents for follow up of hypertension.    Current symptoms are none.  She has been treated with meds for 4 month(s), and states she takes medications regularly. Side effects noted are none; which are well tolerated.      Patient has not been checking ambulatory blood pressure.  H/o GERD , on PPI  H/o insomnia on [place].  C/o poor balance when walking on uneven surface, no falls.    ROS:    Constitutional: negative.  Eyes: negative.  Ears, Nose, Mouth, Throat: hearing loss.  CV: negative.  Resp: negative.  GI: negative.  GU: negative.      History:  I did review patient's past medical and family/social history, no changes noted.    Objective:      General Appearance: skin warm, dry, and pink, cooperative.  Neck:  Neck supple. No adenopathy, thyroid symmetric, normal size.  Heart:  normal rate and regular rhythm, no murmurs, clicks, or gallops.  Lungs: clear to auscultation and normal percussion.  Extremities:  no cyanosis, clubbing, or edema.  Skin:  Skin color, texture, turgor normal. No rashes or lesions.  Mental Status: Appearance/Cooperation: in no apparent distress, alert, afebrile, cooperative and well hydrated      Assessment: Essential hypertension adequately controlled.  See Diagnoses Section.    Plan: See Orders.  1)  Medication: continue current medication regimen unchanged  2)  Dietary sodium restriction  3)  Regular aerobic exercise    401.9 HYPERTENSION NOS  (primary encounter diagnosis)  Comment:   Plan: COMPREHENSIVE METABOLIC PANEL        continue current medications    530.81K GERD (Gastroesophageal Reflux Disease)  Comment:   Plan: continue current medications    272.4 HYPERLIPIDEMIA NEC/NOS  Comment:   Plan: COMPREHENSIVE METABOLIC PANEL, LIPID PANEL WITH        DLDL REFLEX            V58.69 Encounter for Long-Term (Current) Use of Other Medications  Comment:   Plan:  COMPREHENSIVE METABOLIC PANEL, LIPID PANEL WITH        DLDL REFLEX, CBC AUTO + REFLEX MANUAL DIFF, TSH        WITH FREE T4 REFLEX, PNEUMOCOCCAL         VACCINE,23-VALENT,ADULT          Poor balance with walking:  Use walker.      Patient Instruction:  Reviewed risks of hypertension and principles of treatment.    See Patient Education and Orders Sections.      Barriers to Learning assessed: none. Patient verbalizes understanding of teaching and instructions.

## 2008-04-21 ENCOUNTER — Ambulatory Visit

## 2008-04-23 ENCOUNTER — Ambulatory Visit: Admitting: Women's Health

## 2008-04-23 ENCOUNTER — Encounter: Payer: Self-pay | Admitting: Women's Health

## 2008-04-23 ENCOUNTER — Telehealth: Payer: Self-pay | Admitting: Internal Medicine

## 2008-04-23 VITALS — HR 60 | Temp 97.3°F | Resp 16 | Ht 65.0 in | Wt 213.8 lb

## 2008-04-23 DIAGNOSIS — N3946 Mixed incontinence: Secondary | ICD-10-CM | POA: Insufficient documentation

## 2008-04-23 NOTE — Telephone Encounter (Signed)
Please close this encounter, opened in error.

## 2008-04-23 NOTE — Patient Instructions (Signed)
Pessary Care:       Wash with warm soap and water       Do not flush down toilet       Remove overnight at least one night a week       Use a small amount lubricant to assist with pessary insertion       If suture placed on pessary to ease removal, replace with unflavored              dental floss when sutures shows signs of wear  Call clinic at [phone] option 1 if:       Unable to empty bladder or complete bowel movement       Pain from pessary occurs       Unable to remove, replace, or retain pessary       Pink, brown, bloody, or bothersome vaginal discharge occurs

## 2008-04-23 NOTE — Nursing Note (Signed)
>>   Vernetta Honey, LVN     Thu Apr 23, 2008  2:03 PM  Vital signs taken, allergies verified, screened for pain, pharmacy verified.  Vernetta Honey, LVN

## 2008-04-23 NOTE — Progress Notes (Signed)
PESSARY CHECK    Indication for pessary: Urinary Incontinence  Has had hysterectomy: no    Benefits: Controls incontinence  Limits:   Does not eliminate leaking but using fewer pads    Management: Removesd once after first week of use and left out for 3-4 days. Has been in place ever since    Hormone cream: none    Lubricant: none  Problems: none    Satisfied with pessary: yes    Physical Exam:  Pessary removed  No signs of irritation or vaginal infection on speculum exam  Pessary replaced with new device - loaner device returned today   Suture tied to device to help patient with removal    Assessment: mixed oncontinence managed to pt satisfaction with pessary    Plan:Remove pessary once a week    Follow up in: 3-4 months    Urogyn History - prior to pessary use    HPI:  This is a 72yr old female with the following complaints/concerns    Stress Urinary Incontinence   She reports stress urinary incontinence with the following characteristics per report:  Circumstances of leakage:  sneezing, coughing, lifting, physical activity and getting up out of chair.  Frequency:   Several times per month.  Amount:   enough to wet a pad.  Number of pads / diapers:  1 poise large pad for day and 1 at night. Prior to pessary would use more than 1 and pads would be wet, now with pessary wears more for reassurance.  Duration of problem: 20 years.  Problem has improved since it started due to pessary use  Treatments tried Kegels, physical therapy (years ago), anticholinergic medications (on Detral LA now) and pessary - incontinence ring #3 in use now.  Fluid intake:  Not reported.    Urge Urinary Incontinence  She reports urge urinary incontinence with the following characteristics per report:  Circumstances of leakage:  on the way to the bathroom.  Frequency:   Several times per week.  Amount:   enough to wet a pad.  Number of pads:  1 poise large pad for day and 1 at night. Prior to pessary would use more than 1 and pads would be  wet, now with pessary wears more for reassurance .  Duration of problem: 20 years .  Problem has improved since it started due to pessary use and detral la  Treatments tried Kegels, physical therapy (years ago), anticholinergic medications (on Detral LA now) and pessary - incontinence ring #3 in use now. .  Fluid intake:  Not reported.  Caffeine intake not reported    Other Urinary Symptoms:  Per patient : report.  Frequency of urination:  Whenever liquid is drank.  Night-time voiding:  0 number of times per night and is not bothered by this frequency.    Other urinary symptoms reported in questionnaire sense of incomplete voiding.    These incontinence symptoms impact her life in   Ability to do household chores - severely  Ability to maintain employment - severely  Physical recreation - severely  Entertainment activities - severely  Ability to travel - severely  Activities outside the home - severely  Sleep - severely  Sexual relationship - severely  Emotional health - severely    Pelvic Organ Prolapse  She reports the following symptoms of prolapse per annual exam note.  Her prolapse symptoms are physically bothersome and emotionally bothersome.    These prolapse symptoms impact her life in   Ability to  do household chores - severely  Ability to maintain employment - severely  Physical recreation - severely  Entertainment activities - severely  Ability to travel - severely  Activities outside the home - severely  Sleep - severely  Sexual relationship - severely  Emotional health - severely      Constipation/Defecatory dysfunction  Frequency of bowel movements: 1-3 daily. Typical BM is formed logs.  Patient reports straining and splinting rarely.  Fiber in diet uses metamucil and eats fresh produce 3+ times daily .  Treatments tried: bulking agents.    Anal Incontinence  Patient reports flatal incontinence and fecal urgency sometimes.    These bowel symptoms impact her life in   Ability to do household chores -  severely  Ability to maintain employment - severely  Physical recreation - severely  Entertainment activities - severely  Ability to travel - severely  Activities outside the home - severely  Sleep - severely  Sexual relationship - severely  Emotional health - severely  .      POB/GynHx:    G 4 P 3   3 vaginal deliveries. Largest infant 7# 3oz, longest labor @ 18 hours  3rd or 4th degree tear with previous vaginal delivery No.  Date of last mammogram: 2008,  abnormal mammograms No.  Date of last pap smear:2009, Abnormal pap smears No.  Gynecological problems: none.  Currently sexually active: Yes -  Monogamous and Sexual Partner Preference female.  Current sexual problems: none.  Menstrual history:  Age at menarche 21, Age at menopause 72 .  Vaginal bleeding problems none.  Hormone use history:       Past use      :  HT - stopped 5-6 years ago after friend had stroke     Current use :  none.        PMH:   Patient Active Problem List   Diagnoses Code    HYPERTENSION NOS 401.9    HYPERLIPIDEMIA NEC/NOS 272.4    FEMALE STRESS INCONTINENCE 625.6    OSTEOPOROSIS NOS 733.00    HEARING LOSS NOS 389.9    VENOUS THROMBOSIS NEC 453.8    GERD (Gastroesophageal Reflux Disease) 530.81K    Obesity 278.00J      Last colonoscopy: Yes.  Date: 2008                  History of Transfusion No, Would accept transfusion Yes.  Jehovah's Witness No.    Current Meds:  Current outpatient prescriptions   Medication Sig Dispense Refill    OMEGA 3 PO         MULTI-VITAMIN PO         IRON 325 mg (65 mg Iron) PO Tab Take 1 Tab by mouth 2 times daily.         HCA ASPIRIN 81 mg PO Tab Take 1 Tab by mouth every day.         CALTRATE PLUS 600-400 mg-unit PO Tablet         Zolpidem (AMBIEN) 10 mg PO Tablet Take 1 Tab by mouth every day at bedtime.   90  3    Metoprolol (LOPRESSOR) 50 mg PO Tablet Take 1 Tab by mouth 2 times daily.   90-day supply  3    Ramipril (ALTACE) 10 mg PO Capsule Take 1 Cap by mouth every day.   90-day supply  3     OMEPRAZOLE 20 MG CAP, DELAYED RELEASE take 1 capsule by mouth twice daily  180  4    ZOCOR 20 MG TAB one po at bedtime  90  4    DETROL LA 4 MG 24 HR CAP one po daily  90  4    EVISTA 60 MG TAB 1 tablet daily  90  3    ACIPHEX 20 MG TAB take two daily  180  4         ALLERGIES:   Allergies   Allergen Reactions    Sulfa (Sulfonamides) Rash and Fever    Novocain (Procaine Hcl) Syncope and Seizures    Clindamycin Rash           PSH:    Past Surgical History   Procedure Date    Remv 2nd cataract,corn-scler sectn 2004     Cataract removal    Surgery of breast capsule      xanthoma tumor removed at age 49    Thyroidectomy      for goiter at age 30    Lap,hernia repair proc,unlist 1989     umbical hernia    Colonoscopy 2007         SOCIAL HISTORY:    History   Social History    Marital Status: MARRIED     Spouse Name: Dennard Nip     Number of Children: 3    Years of Education: N/A   Occupational History    retired, Risk analyst    Social History Main Topics    Tobacco Use: Never    Alcohol Use: No    Drug Use: No    Sexually Active: Yes -- Female partner(s)   Other Topics Concern    Not on file   Social History Narrative    No narrative on file       FAMILY HISTORY:   Family History   Problem Relation    Heart Father     MI    Hypertension Brother     MI    Cancer Mother    Stroke Mother     multiple    Hypertension Mother    Stroke Maternal Grandmother         ROS:     General Health good.    Constitutional: negative.  Eyes: negative.  Ears, Nose, Mouth, Throat: totally deaf.  CV: lower extremity edema.  Resp: negative.  GI: heartburn or reflux, melena.  GU: negative.  Musculoskeletal: negative.  Integumentary: rash.  Neuro: memory loss, paralysis/weakness.  Psych: euthymic.  Endo: negative.  Heme/Lymphatic: anemia, abnormal bruising.  Allergy/Immun: negative.  GYN: negative, trouble sleeping.      The patient has no barriers to learning and was instructed on her treatment plan, use of  medications and follow up. Educational information given. She verbalizes understanding of these instructions.   Vernon Prey, RN/NP  My attending physician for this encounter is Dr. Claude Manges

## 2008-04-30 ENCOUNTER — Telehealth: Payer: Self-pay | Admitting: Internal Medicine

## 2008-04-30 NOTE — Telephone Encounter (Signed)
Patient dropped off a Disabled Person Placard Form to be filled out and then sent over to the [facility].  Form placed on Dr [employer].  Thank you.

## 2008-05-07 NOTE — Telephone Encounter (Signed)
Disabled parking form completed, please mail to [facility].

## 2008-05-07 NOTE — Telephone Encounter (Addendum)
Copy made for patient's chart and original form mailed to [facility].

## 2008-05-15 NOTE — Telephone Encounter (Signed)
PLEASE CLOSE ENCOUNTER IF COMPLETED. THANKS.

## 2008-05-29 ENCOUNTER — Ambulatory Visit

## 2008-06-04 NOTE — Telephone Encounter (Addendum)
Form was sent to [facility] per original message noted below.

## 2008-06-04 NOTE — Telephone Encounter (Addendum)
Patient calling stated that she has not heard anything, regarding handicap placard. Informed pt that it was mailed on 09/24.

## 2008-06-23 ENCOUNTER — Telehealth: Payer: Self-pay | Admitting: Internal Medicine

## 2008-06-23 NOTE — Telephone Encounter (Signed)
Called Patient and fax machine is hooked up will try back tomorrow.Melinda [name]

## 2008-06-23 NOTE — Telephone Encounter (Signed)
UA ordered

## 2008-06-23 NOTE — Telephone Encounter (Signed)
Pt coming in for her scheduled flu shot on 11/12, would like MD to order urine test so that she can give sample that day also.  She is experiencing urinary frequency.  She is not experiencing urgency, pain, or blood in urine.  She believes she has a UTI.  Please call pt if lab ordered.

## 2008-06-25 ENCOUNTER — Ambulatory Visit

## 2008-06-25 NOTE — Telephone Encounter (Signed)
patient's identification verified x 3. Patient advised and already completed the ua.   Donzetta Kohut, MA I.

## 2008-06-25 NOTE — Nursing Note (Signed)
>>   Melinda [name]     Thu Jun 25, 2008  2:43 PM  The patient completed the flu questionnaire and signed the consent. The Inactivated Influenza Vaccine VIS document was given to patient to review and any questions they had were answered. The consent was then reviewed for any Yes answers.  All questions were answered as No. The Influenza Vaccine was then administered per protocol. The patient was observed for immediate reactions to the vaccine per protocol. None were observed.[name] MA

## 2008-07-15 ENCOUNTER — Encounter: Admitting: Internal Medicine

## 2008-07-15 ENCOUNTER — Other Ambulatory Visit: Payer: Self-pay | Admitting: Internal Medicine

## 2008-07-15 MED ORDER — SIMVASTATIN 20 MG TABLET
20.0000 mg | ORAL_TABLET | Freq: Every day | ORAL | Status: DC
Start: 2008-07-15 — End: 2008-07-21

## 2008-07-15 MED ORDER — METOPROLOL TARTRATE 50 MG TABLET
1.0000 | ORAL_TABLET | Freq: Two times a day (BID) | ORAL | Status: DC
Start: 2008-07-15 — End: 2008-07-21

## 2008-07-15 MED ORDER — RAMIPRIL 10 MG CAPSULE
10.0000 mg | ORAL_CAPSULE | Freq: Every day | ORAL | Status: DC
Start: 2008-07-15 — End: 2008-07-21

## 2008-07-15 MED ORDER — ZOLPIDEM 10 MG TABLET
1.0000 | ORAL_TABLET | Freq: Every day | ORAL | Status: DC
Start: 2008-07-15 — End: 2008-07-21

## 2008-07-15 NOTE — Telephone Encounter (Signed)
rx's sent to [facility] in [place].

## 2008-07-15 NOTE — Telephone Encounter (Signed)
Pt is calling through the [facility] system and is requesting to get a refill faxed of the following meds  to the [facility] [place].    1. Metoprolol 50mg  1 tab 2x per day  2. Ramipril 10mg  1 cap per day  3. Zolpidem 10mg  1 tab at bedtime  4. Simvastatin mg ?    Also per pt if you will need to E-Mail her you can reach her at [email or web address]

## 2008-07-20 ENCOUNTER — Ambulatory Visit

## 2008-07-21 MED ORDER — SIMVASTATIN 20 MG TABLET
20.0000 mg | ORAL_TABLET | Freq: Every day | ORAL | Status: DC
Start: 2008-07-21 — End: 2009-12-07

## 2008-07-21 MED ORDER — METOPROLOL TARTRATE 50 MG TABLET
1.0000 | ORAL_TABLET | Freq: Two times a day (BID) | ORAL | Status: DC
Start: 2008-07-21 — End: 2010-03-04

## 2008-07-21 MED ORDER — ZOLPIDEM 10 MG TABLET
1.0000 | ORAL_TABLET | Freq: Every day | ORAL | Status: DC
Start: 2008-07-21 — End: 2009-02-12

## 2008-07-21 MED ORDER — RAMIPRIL 10 MG CAPSULE
10.0000 mg | ORAL_CAPSULE | Freq: Every day | ORAL | Status: DC
Start: 2008-07-21 — End: 2009-08-04

## 2008-07-21 NOTE — Telephone Encounter (Addendum)
1.  90d supply sent on all 4 meds.  2.  I want her to take metoprolol 50 mg bid instead of Toprol XL 100 mg once a day.

## 2008-07-21 NOTE — Telephone Encounter (Addendum)
Pt is now calling back stating that she got the wrong med.  She didn't want the Metroprolol, she wanted the Toprol XL instead, that was given to her before Dec.  Please send to the marked above pharmacy.  Pt is worried about having a heart attack or a stroke if she doesn't have this med.

## 2008-07-21 NOTE — Telephone Encounter (Addendum)
Pt is calling back to let you know that she wanted a 90 day supply of the marked above med and thinks that she didn't get the correct dosage of her prescriptions.  Pt will check her computer for the correct dose, and will call back.

## 2008-07-21 NOTE — Telephone Encounter (Addendum)
Addended by: Judith Blonder on: 07/21/2008      Modules accepted: Orders, Medications

## 2008-07-21 NOTE — Telephone Encounter (Addendum)
Called and Left relay message for pt of below MD'S message.

## 2008-07-22 ENCOUNTER — Encounter: Admitting: Women's Health

## 2008-08-05 ENCOUNTER — Telehealth: Payer: Self-pay | Admitting: Internal Medicine

## 2008-08-05 NOTE — Telephone Encounter (Signed)
Pts husband Melinda [name] states pt  is requesting to have test results from 06-25-08 faxed to [phone]. Melinda [name] states pt is currently out of town due to family.

## 2008-08-05 NOTE — Telephone Encounter (Signed)
Test results from 06-25-08 have been faxed to [phone].

## 2008-08-05 NOTE — Telephone Encounter (Signed)
Please do so

## 2008-08-05 NOTE — Telephone Encounter (Signed)
Pts husband Dennard Nip is requesting to have 90-day supply and 3 refills sent to [facility]  @ [phone]

## 2008-08-05 NOTE — Telephone Encounter (Signed)
Is she still Dr. Sandria Manly patient.  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  Why do they want a year's supply sent there?  Thank you.

## 2008-08-10 MED ORDER — TOLTERODINE ER 4 MG CAPSULE,EXTENDED RELEASE 24 HR
4.0000 mg | EXTENDED_RELEASE_CAPSULE | Freq: Every day | ORAL | Status: DC
Start: 2008-08-05 — End: 2008-08-21

## 2008-08-10 NOTE — Telephone Encounter (Signed)
[facility] [place]

## 2008-08-10 NOTE — Telephone Encounter (Signed)
Not able to reach pt by the only home number. [phone], said please using txt or dial relay?

## 2008-08-11 ENCOUNTER — Telehealth: Payer: Self-pay | Admitting: Internal Medicine

## 2008-08-11 NOTE — Telephone Encounter (Signed)
Blood count, chemistry panel, glucose, thyroid tests all normal.  Follow low, fat, low cholesterol, low sodium diet.

## 2008-08-11 NOTE — Telephone Encounter (Signed)
Through TTY: patient's husband calling on behalf of wife's request re: abnormal labs.  I told Mr. Willhite I didn't believe labs results were extremely abnormal and could be dietary related but I would ask PCP. Please advise.  If patient needs to make dietary changes, could a list of foods to eat or avoid be mailed to patient @ [address],   [place], [place] [address]. [place] out of state at [place] time.

## 2008-08-11 NOTE — Telephone Encounter (Signed)
Noted.  Will send pamphlet out to patient.

## 2008-08-18 ENCOUNTER — Ambulatory Visit

## 2008-08-19 ENCOUNTER — Encounter

## 2008-08-21 ENCOUNTER — Telehealth: Payer: Self-pay | Admitting: Internal Medicine

## 2008-08-21 NOTE — Telephone Encounter (Signed)
Leaving for PCP

## 2008-08-21 NOTE — Telephone Encounter (Signed)
Pts husband Dennard Nip states pt is requesting to have a hard copy of above med sent to her.     Dennard Nip also states back in 06-2008 pt had lab test that were indicated her AST was high, Dennard Nip states pt would like to know what is going on with liver? Dennard Nip states pt states during her Colonoscopy about 2 years ago there was a spot on her liver.      [address]  [place], [place]   [address]

## 2008-08-24 MED ORDER — TOLTERODINE ER 4 MG CAPSULE,EXTENDED RELEASE 24 HR
4.0000 mg | EXTENDED_RELEASE_CAPSULE | Freq: Every day | ORAL | Status: DC
Start: 2008-08-24 — End: 2009-09-01

## 2008-08-24 MED ORDER — TOLTERODINE ER 4 MG CAPSULE,EXTENDED RELEASE 24 HR
4.0000 mg | EXTENDED_RELEASE_CAPSULE | Freq: Every day | ORAL | Status: AC
Start: 2008-08-21 — End: 2009-09-21

## 2008-08-24 NOTE — Telephone Encounter (Signed)
Please mail medication asap.    Patient is calling about the labs/status on the liver.    Please send a 2 week supply to selected pharmacy.    Please call thru [place] Relay to contact patient.

## 2008-08-24 NOTE — Telephone Encounter (Signed)
rx sent to pharmacy.  Rx written. Please mail to patient.     AST, liver test, 48, slightly elevated 06/25/08, not concerning, will repeat at next visit.

## 2008-08-26 NOTE — Telephone Encounter (Signed)
Left detailed message with [place] relay , advised as per Dr Gregary Cromer.  Mailed rx to patient 's home address as requested.   Donzetta Kohut, MA I.

## 2008-09-23 ENCOUNTER — Other Ambulatory Visit: Payer: Self-pay | Admitting: Internal Medicine

## 2008-09-23 ENCOUNTER — Telehealth: Payer: Self-pay | Admitting: Internal Medicine

## 2008-09-23 MED ORDER — OMEPRAZOLE 20 MG CAPSULE,DELAYED RELEASE
1.0000 | DELAYED_RELEASE_CAPSULE | Freq: Two times a day (BID) | ORAL | Status: DC
Start: 2008-09-23 — End: 2009-11-24

## 2008-09-23 MED ORDER — OMEPRAZOLE 20 MG CAPSULE,DELAYED RELEASE
1.0000 | DELAYED_RELEASE_CAPSULE | Freq: Two times a day (BID) | ORAL | Status: DC
Start: 2008-09-23 — End: 2008-09-23

## 2008-09-23 NOTE — Telephone Encounter (Signed)
Pt is requesting a 90 day supply with 3 refills of the marked above med to [facility].

## 2008-09-23 NOTE — Telephone Encounter (Signed)
rx omeprazole bid #[phone].

## 2008-11-09 ENCOUNTER — Telehealth: Payer: Self-pay | Admitting: Internal Medicine

## 2008-11-09 NOTE — Telephone Encounter (Signed)
Relay called back and I re told him that Dr. Dorris Fetch has instructed her to be seen by the nearest ER or [place].

## 2008-11-09 NOTE — Telephone Encounter (Addendum)
L/M via [employer] indicating covering MD did receive msg; however, she will need to  be seen at either local ER or [facility] where she presently is visiting.Yetta Barre Lyssa Hackley, R.N.

## 2008-11-09 NOTE — Telephone Encounter (Signed)
Pts husband Dennard Nip states pt has sent a e-mail to him stating her feet have turned blue and are life less and thinks she may have "PAD" Dennard Nip states pt would like to know if she can have a Rx for Nitric Oxide (Angioprim)? Dennard Nip states he doesn't know if pt has any other SX and doesn't have to much information about it , only what she has faxed and states MD is familiar with this situation already.Dennard Nip states he will be faxing over e-mail to MD as well.

## 2008-11-09 NOTE — Telephone Encounter (Signed)
Please triage.  She needs to be seen by a MD wherever she is.  May need to be seen in the ER. Thank you.

## 2008-11-10 ENCOUNTER — Encounter (INDEPENDENT_AMBULATORY_CARE_PROVIDER_SITE_OTHER): Payer: Self-pay | Admitting: Emergency Medicine

## 2008-11-10 ENCOUNTER — Emergency Department (HOSPITAL_COMMUNITY): Admission: EM | Admit: 2008-11-10 | Discharge: 2008-11-10 | Payer: Self-pay | Admitting: Emergency Medicine

## 2008-11-10 ENCOUNTER — Ambulatory Visit: Payer: Self-pay | Admitting: Surgery

## 2008-12-16 ENCOUNTER — Telehealth: Payer: Self-pay | Admitting: Internal Medicine

## 2008-12-16 NOTE — Telephone Encounter (Signed)
Per fax, pharmacy asking MD to call [phone] for auth on Zolpidem.  Spoke to [place] @ [facility] and he has approved patient's medication on basis of continuation of care good from 10/07/08 to 10/07/09  [facility]

## 2009-02-12 ENCOUNTER — Other Ambulatory Visit: Payer: Self-pay | Admitting: Internal Medicine

## 2009-02-12 MED ORDER — ZOLPIDEM 10 MG TABLET
1.0000 | ORAL_TABLET | Freq: Every day | ORAL | Status: DC
Start: 2009-02-12 — End: 2009-04-30

## 2009-02-12 NOTE — Telephone Encounter (Signed)
Pharmacy fax , last refill 01/13/09 . Saanya Zieske, MA I

## 2009-03-29 ENCOUNTER — Ambulatory Visit

## 2009-03-30 ENCOUNTER — Ambulatory Visit: Admitting: Internal Medicine

## 2009-03-30 VITALS — BP 122/74 | HR 74 | Temp 98.2°F | Resp 16 | Wt 216.3 lb

## 2009-03-30 MED ORDER — TRIAMCINOLONE ACETONIDE 0.1 % TOPICAL CREAM
TOPICAL_CREAM | Freq: Two times a day (BID) | TOPICAL | Status: AC
Start: 2009-03-30 — End: 2009-09-26

## 2009-03-30 NOTE — Nursing Note (Signed)
>>   Jason Fila, MA     Tue Mar 30, 2009  2:28 PM  Patient has been positively identified by date of birth and last name. Vitals taken, screened for pain,  pharmacy  and allergies have been reviewd .  Donzetta Kohut, MA I.

## 2009-03-30 NOTE — Progress Notes (Signed)
Chief Complaint   Patient presents with    Rash     on left foot x 2-3 days , itches          Subjective:   Melinda [name] is a(n) 73yr old female who presents for a new problem of rash on left foot.    She states that since onset 3 day(s) ago, the symptoms are unchanged.  Associated symptoms include rash over top of left foot, red, itchy, she tried antifungal solution, no help or worse.  She has not had similar symptoms in the past.    ROS:    Constitutional: negative.  Eyes: negative.  Ears, Nose, Mouth, Throat: negative.  CV: negative.  Resp: negative.  GI: negative.  GU: negative.  Musculoskeletal: negative.  Integumentary: rash.      History:  I did review patient's past medical and family/social history, no changes noted.    Objective:      General Appearance: skin warm, dry, and pink, cooperative.  Skin:  Dry red, scaly rash on dorsum left foot.      Assessment: eczema left foot  See Diagnoses Section.    Plan:  TAC cream bid.  See Orders.    692.9BU Eczema  (primary encounter diagnosis)  Comment:   Plan: TRIAMCINOLONE ACETONIDE 0.1 % TOPICAL CREAM        bid      Patient Instruction:  See Patient Education section.      Barriers to Learning assessed: none. Patient verbalizes understanding of teaching and instructions.

## 2009-04-30 ENCOUNTER — Other Ambulatory Visit: Payer: Self-pay | Admitting: Internal Medicine

## 2009-04-30 MED ORDER — ZOLPIDEM 10 MG TABLET
1.0000 | ORAL_TABLET | Freq: Every day | ORAL | Status: DC
Start: 2009-04-30 — End: 2009-11-03

## 2009-04-30 NOTE — Telephone Encounter (Signed)
faxed from pharmacy  last refilled:04/05/09  Ewa Hipp L. Aunisty Reali

## 2009-05-05 ENCOUNTER — Ambulatory Visit

## 2009-05-05 ENCOUNTER — Telehealth: Payer: Self-pay | Admitting: Internal Medicine

## 2009-05-05 NOTE — Telephone Encounter (Signed)
Pt is requesting an order for the TDAP vaccine for the the whooping cough.

## 2009-05-05 NOTE — Telephone Encounter (Signed)
patient's identification verified x 3. Patient advised to call her insurance company to see if covered, patient advised of order, will call insurance company then schedule if necessary . Call placed through relay [phone].  Donzetta Kohut, MA I.

## 2009-05-05 NOTE — Telephone Encounter (Signed)
Tdap ordered.  Probably not covered by insurance.

## 2009-05-17 ENCOUNTER — Ambulatory Visit

## 2009-05-17 ENCOUNTER — Telehealth: Payer: Self-pay | Admitting: Internal Medicine

## 2009-05-17 NOTE — Telephone Encounter (Signed)
Pt is requesting to have any lab test done before her appt on 05/25/09.

## 2009-05-18 ENCOUNTER — Ambulatory Visit

## 2009-05-18 DIAGNOSIS — Z23 Encounter for immunization: Secondary | ICD-10-CM

## 2009-05-18 NOTE — Nursing Note (Signed)
>>   [name], MA     Tue May 18, 2009  4:58 PM  The patient completed the flu questionnaire and signed the consent. The Inactivated Influenza Vaccine VIS document was given to the patient to review and any questions they had were answered. The consent was then reviewed for any Yes answers.  All questions were answered as No. The Influenza Vaccine was then administered per protocol. The patient was observed for immediate reactions to the vaccine per protocol. None were observed.  huyen nguyen MA

## 2009-05-18 NOTE — Telephone Encounter (Signed)
Voice message left regarding fasting labs.

## 2009-05-18 NOTE — Telephone Encounter (Signed)
Labs ordered.

## 2009-05-25 ENCOUNTER — Ambulatory Visit: Admitting: Internal Medicine

## 2009-05-25 VITALS — BP 136/82 | HR 62 | Resp 18 | Wt 217.0 lb

## 2009-05-25 NOTE — Progress Notes (Signed)
Melinda [name]  presents for preventive care.  Her last physical was 1 year(s) ago.  In general she has been feeling well and functioning well at home, work, and personal relationships..  C/o stiffness in knees with walking, no pain, swelling, no injury, falls.  Hypertension: Patient has not been checking ambulatory blood pressure. She takes medications regularly. Current cardiovascular symptoms are none.    Dyslipidemia: She has no regular exercise program and describes her diet as low fat/low cholesterol.   Last lipid panel shows LDL at goal and HDL at goal.  She is taking a statin.    Previous Paps have been normal. Previous mammograms have been normal.     Immunization History   Administered Date(s) Administered    Influenza Vaccine (Fluarix) 05/15/2007, 06/25/2008    Influenza Vaccine (Fluzone/fluarix Prefill Syringe) 05/18/2009    Influenza Virus Vaccine, Split (0.5 Ml) 06/26/2005    Influenza Virus Vaccine, Whole 05/25/2004    Pneumococcal vaccine, Polysaccharide (Pneumovax) 04/15/2008    Tetanus Diptheria (Td - Adult) 05/25/2009       Family history is significant for unchanged.    Sexual History: is heterosexual    Social history: I  did review and update substance use history form.  CAGE questions positive for no to all    Safety issues:  The patient does routinely use seatbelts.    ROS:  Constitutional: negative.  Eyes: negative.  Ears, Nose, Mouth, Throat: negative.  CV: negative.  Resp: negative.  GI: negative.  GU: negative.  Musculoskeletal: AM joint stiffness.  Integumentary: negative.  Neuro: negative.  Psych: Mood pt's report, euthymic.    Exam:  General Appearance: skin warm, dry, and pink, cooperative. Sign interpreter present.  Eyes:  conjunctivae and corneas clear. PERRL, EOM's intact. sclerae normal.  Neck:  Neck supple. No adenopathy, thyroid symmetric, normal size.  Heart:  normal rate and regular rhythm, no murmurs, clicks, or gallops.  Lungs: clear to auscultation and normal  percussion.  Extremities:  no cyanosis, clubbing, or edema.  Skin:  Skin color, texture, turgor normal. No rashes or lesions.  Neuro: Gait normal. Reflexes normal and symmetric. Sensation and strength grossly normal.  Mental Status: Appearance/Cooperation: in no apparent distress, alert, afebrile, cooperative and well hydrated.    ASSESSMENT:    V70.0 Routine general medical examination at a health care facility  (primary encounter diagnosis)  Comment:   Plan: TD IMMUNIZATION,IM/JET>7YO            401.9 HYPERTENSION NOS  Comment:   Plan: continue current medications    272.4 HYPERLIPIDEMIA NEC/NOS  Comment:   Plan: continue current medications    733.00 OSTEOPOROSIS NOS  Comment:   Plan: continue current medications    389.9 HEARING LOSS NOS  Comment:   Plan:     788.33D Mixed incontinence urge and stress  Comment:   Plan: continue current medications    I did review patient's past medical and family/social history, no changes noted.  Barriers to Learning assessed: none. Patient verbalizes understanding of teaching and instructions.      Melinda Barter, MD

## 2009-05-25 NOTE — Nursing Note (Signed)
>>   Jason Fila, MA     Tue May 25, 2009  4:51 PM  Immunization VIS documentation(s) were given to Tiny to review. All questions were answered and the patient consented to the Immunization(s) being given. Patient allergies were reviewed and no contraindications were found. The immunization(s) were given as ordered. The patient was observed for any immediate reactions to the vaccine. None were observed.  Donzetta Kohut, MA I.     >> Jason Fila, [place]     Tue May 25, 2009  4:14 PM  Patient has been positively identified by date of birth and last name. Vitals taken, screened for pain,  pharmacy  and allergies have been reviewd .  Donzetta Kohut, MA I.

## 2009-06-01 ENCOUNTER — Encounter: Payer: Self-pay | Admitting: Internal Medicine

## 2009-06-02 ENCOUNTER — Telehealth: Payer: Self-pay | Admitting: Internal Medicine

## 2009-06-02 NOTE — Telephone Encounter (Signed)
She is calling for the results of lab test done at [place] [place] 05/25/09.   She would like a phone call back.

## 2009-06-03 NOTE — Telephone Encounter (Signed)
Results in EMR , please advise.   Donzetta Kohut, MA I.

## 2009-06-04 NOTE — Telephone Encounter (Signed)
Pt has been given your message with a verbal understanding. Pt is puzzled that her Vit D is low, because she takes Caltrate with Vit D and drinks a lot milk and eats yogurt.

## 2009-06-04 NOTE — Telephone Encounter (Signed)
Left message on voice mail for patient to call back to the office.   Liem Copenhaver M Jarris Kortz, MA

## 2009-06-04 NOTE — Telephone Encounter (Signed)
Blood count, chemistry panel, glucose, thyroid, cholesterol tests all normal.  Vitamin D is low, she should take vitamin D 1,000 units once a day, this is OTC.  She should repeat the vitamin D test in 3 months, ordered.

## 2009-08-04 ENCOUNTER — Other Ambulatory Visit: Payer: Self-pay | Admitting: Internal Medicine

## 2009-08-04 MED ORDER — RAMIPRIL 10 MG CAPSULE
10.0000 mg | ORAL_CAPSULE | Freq: Every day | ORAL | Status: DC
Start: 2009-08-04 — End: 2010-09-06

## 2009-08-04 NOTE — Telephone Encounter (Signed)
last refill: no last refill date given.    pharmacy fax request  [name] MA

## 2009-09-01 ENCOUNTER — Telehealth: Payer: Self-pay | Admitting: Internal Medicine

## 2009-09-01 MED ORDER — TOLTERODINE ER 4 MG CAPSULE,EXTENDED RELEASE 24 HR
4.0000 mg | EXTENDED_RELEASE_CAPSULE | Freq: Every day | ORAL | Status: DC
Start: 2009-09-01 — End: 2010-10-10

## 2009-09-01 NOTE — Telephone Encounter (Signed)
Pt's husband Dennard Nip is calling to request a refill on the detrol medication, faxed to pharm above, please.

## 2009-09-01 NOTE — Telephone Encounter (Signed)
rx detrol.

## 2009-11-03 ENCOUNTER — Telehealth: Payer: Self-pay | Admitting: Internal Medicine

## 2009-11-03 ENCOUNTER — Other Ambulatory Visit: Payer: Self-pay | Admitting: Internal Medicine

## 2009-11-03 MED ORDER — ZOLPIDEM 10 MG TABLET
1.0000 | ORAL_TABLET | Freq: Every day | ORAL | Status: DC
Start: 2009-11-03 — End: 2010-05-31

## 2009-11-03 NOTE — Telephone Encounter (Signed)
Per fax medication zolpidem 10mg  is not covered, please change or do you want to start a prior auth? PLease advise.Norvel Wenker.MAII

## 2009-11-03 NOTE — Telephone Encounter (Signed)
Called patients insurance @ [phone]  # [id number], her prior Berkley Harvey was approved from 10/08/09- 10/08/10, I called the pharmacy and notified them as well.Lamiah Marmol.MAII

## 2009-11-03 NOTE — Telephone Encounter (Signed)
Please start PA for zolpidem 10 mg.

## 2009-11-03 NOTE — Telephone Encounter (Signed)
received fax from pharmacy, last refill [phone].Casey Fye.MAII

## 2009-11-24 ENCOUNTER — Other Ambulatory Visit: Payer: Self-pay | Admitting: Internal Medicine

## 2009-11-24 MED ORDER — OMEPRAZOLE 20 MG CAPSULE,DELAYED RELEASE
1.0000 | DELAYED_RELEASE_CAPSULE | Freq: Two times a day (BID) | ORAL | Status: AC
Start: 2009-11-24 — End: 2010-11-24

## 2009-11-24 NOTE — Telephone Encounter (Signed)
[facility] Judeth Horn, LVN

## 2009-12-07 ENCOUNTER — Other Ambulatory Visit: Payer: Self-pay | Admitting: Internal Medicine

## 2009-12-07 NOTE — Telephone Encounter (Signed)
[facility], no previous fill date or prescription # given. We last filled 12/09 for one year, per EMR

## 2009-12-08 MED ORDER — SIMVASTATIN 20 MG TABLET
20.0000 mg | ORAL_TABLET | Freq: Every day | ORAL | Status: DC
Start: 2009-12-07 — End: 2010-11-08

## 2009-12-08 NOTE — Telephone Encounter (Signed)
According to the original fax, the fax number on the top of the paper printed [phone], so pharmacy selected, please send rx refill back. thanks

## 2009-12-08 NOTE — Telephone Encounter (Signed)
Pharmacy not indicated on fax, I went by the closest fax #. Patient's phone # is a fax #.

## 2009-12-08 NOTE — Telephone Encounter (Signed)
Please enter pharmacy.

## 2010-03-04 ENCOUNTER — Other Ambulatory Visit: Payer: Self-pay | Admitting: Internal Medicine

## 2010-03-04 MED ORDER — METOPROLOL TARTRATE 50 MG TABLET
1.0000 | ORAL_TABLET | Freq: Two times a day (BID) | ORAL | Status: DC
Start: 2010-03-04 — End: 2011-01-25

## 2010-03-04 NOTE — Telephone Encounter (Signed)
Pharmacy fax , last refill 02/01/10 . Gidget Quizhpi, MA I

## 2010-05-31 ENCOUNTER — Other Ambulatory Visit: Payer: Self-pay | Admitting: Internal Medicine

## 2010-05-31 MED ORDER — ZOLPIDEM 10 MG TABLET
1.0000 | ORAL_TABLET | Freq: Every day | ORAL | Status: DC
Start: 2010-05-31 — End: 2010-11-28

## 2010-05-31 NOTE — Telephone Encounter (Signed)
Pharmacy fax request  Last refill 05/01/2010  Lana Arvid Marengo MA 1

## 2010-09-06 ENCOUNTER — Other Ambulatory Visit: Payer: Self-pay | Admitting: Internal Medicine

## 2010-09-06 MED ORDER — RAMIPRIL 10 MG CAPSULE
10.0000 mg | ORAL_CAPSULE | Freq: Every day | ORAL | Status: DC
Start: 2010-09-06 — End: 2011-09-27

## 2010-09-06 NOTE — Telephone Encounter (Signed)
Pharmacy fax , last refill 05/25/10 . Jason Fila, MA II.

## 2010-09-15 ENCOUNTER — Ambulatory Visit

## 2010-09-21 ENCOUNTER — Ambulatory Visit: Admitting: Internal Medicine

## 2010-09-21 ENCOUNTER — Encounter: Payer: Self-pay | Admitting: Internal Medicine

## 2010-09-21 VITALS — BP 124/80 | HR 62 | Temp 96.6°F | Resp 18 | Ht 64.0 in | Wt 217.7 lb

## 2010-09-21 MED ORDER — PEG-ELECTROLYTE SOLUTION ORAL
4.0000 L | Freq: Once | ORAL | Status: AC
Start: 2010-09-21 — End: 2010-09-21

## 2010-09-21 NOTE — Patient Instructions (Signed)
Why do I want you to have a colonoscopy?     Colon (large intestine) cancer is one of the most common types of cancer, and kills 50,000 people per year. It can affect men or women, and people from every race and walk of life.   The good news is that it is one of the few preventable forms of cancer.   A little background on colon cancer: It almost always starts when a small piece of the lining of the large intestine starts to grow out of control. It forms a bump called a polyp.  Over time, this polyp can grow and change, and some of them turn to cancer. If this is not caught early enough, it can spread around the body and kill.   The trick to preventing colon cancer is to find the polyps that turn to cancer and remove them before the can cause trouble.  That's where colonoscopy comes in. This is a procedure where a gastroenterologist (stomach and intestinal specialist) uses a special tube (colononoscope) that has a light, camera, and other tools to sample tissue or stop bleeding.  It is introduced through the rectum and advanced all the way the end of the colon.  If he or she finds a polyp, it can be removed, so cancer never had a chance to form.   Obviously, most people do not like the idea of something going up their colon, but it's not as bad as you would think. You will be put into twilight sleep (or concious sedation) for the procedure, so you won't feel pain or remember what happened. Most people say the procedure was no big deal.  However, the preparation takes some planning.  You will need to keep a clear liquid diet ( No solids, no vegetables, no dairy, no red food) the whole day before test. The afternoon on the day prior to test you need to drink a  gallon bottle of a liquid that will safely cause diarrhea for several hours.  Keeping the drink cold, and drinking it with a straw can help minimize the taste that some people do not like. During the prep you need to continue to drink plenty of fluids to  prevent dehydration.  This will clean out the intestines, so the doctor has a clear view of the inside of the colon.   Rarely,  a problem can occur, like bleeding, perforation (opening a hole), infection, or reaction to the pain medicines used. This is very rare and happens in about 1 of every 1000 tests.   On the day of the test, you need a ride to and from the lab, as you will be too groggy afterwards to drive, work, or make important decisions.     Be sure to pick up the liquid for the colon preparation from your pharmacy- the prescription has been sent electonically.     The lab should call you within 10 days to schedule the procedure.   If not, you can call the scheduler:     [place]  [address]   [place], [place] [address]   [phone]     [place]  [address]   [place], [place] [address]  [place]     [phone]    [facility]  [address]   [place], [place] [address]   [phone]

## 2010-09-21 NOTE — Nursing Note (Signed)
>>   SVETLANA [facility]     Wed Sep 21, 2010  3:09 PM  Immunization VIS documentation(s) were given to Melinda [name] to review. All questions were answered and the patient consented to the Immunization(s) being given. Patient allergies were reviewed and no contraindications were found. The immunization(s) were given as ordered. The patient was observed for any immediate reactions to the vaccine. None were observed.Leamon Arnt MA     >> Melinda [name]     Wed Sep 21, 2010  2:36 PM  Vital signs taken, allergies verified, screened for pain, Pharmacy Verfied  Melinda [name], Melinda [name]

## 2010-09-21 NOTE — Progress Notes (Signed)
Chief Complaint   Patient presents with    Medication Follow Up    Knee Problem     both         Subjective: Melinda [name] is a(n) 75yr old female who presents for follow up of hypertension.    Current symptoms are none.  She has been treated with meds for 2 year(s), and states she takes medications regularly. Side effects noted are none; which are not applicable.      Patient has not been checking ambulatory blood pressure.  C/o stiffness in knees, occ pain with walking, comes and goes.  Dyslipidemia: She has no regular exercise program and describes her diet as low fat/low cholesterol.   Last lipid panel shows LDL at goal and HDL at goal.  She is taking a statin.  C/o BRBPR 6 months ago while in [place]. [place] resolved after 2 days, not heavy bleeding    ROS:  Constitutional: negative.  Eyes: negative.  Ears, Nose, Mouth, Throat: negative.  CV: negative.  Resp: negative.  GI: negative.  GU: negative.    History:  I did review patient's past medical and family/social history, no changes noted.  She has been staying with daughter in [place] for over a year.    Objective:    General Appearance: skin warm, dry, and pink, cooperative.  Eyes:  conjunctivae and corneas clear. PERRL, EOM's intact. sclerae normal.  Mouth: normal.  Neck:  Neck supple. No adenopathy, thyroid symmetric, normal size.  Heart:  normal rate and regular rhythm, no murmurs, clicks, or gallops.  Lungs: clear to auscultation.  Extremities:  no cyanosis, clubbing, or edema.  Skin:  Skin color, texture, turgor normal. No rashes or lesions.  Mental Status: Appearance/Cooperation: in no apparent distress, alert, afebrile and cooperative    Assessment: Essential hypertension adequately controlled.  See Diagnoses Section.    Plan: See Orders.  1)  Medication: continue current medication regimen unchanged  2)  Dietary sodium restriction  3)  Regular aerobic exercise    Patient Instruction:  Reviewed risks of hypertension and principles of  treatment.    See Patient Education and Orders Sections.      401.9 HYPERTENSION NOS  (primary encounter diagnosis)  Comment:   Plan: CBC AUTO + REFLEX MANUAL DIFF            272.4 HYPERLIPIDEMIA NEC/NOS  Comment:   Plan: COMPREHENSIVE METABOLIC PANEL, LIPID PANEL WITH        DLDL REFLEX            733.00 OSTEOPOROSIS NOS  Comment:   Plan: CBC AUTO + REFLEX MANUAL DIFF, TSH WITH FREE T4        REFLEX, VITAMIN D, 25 HYDROXY            389.9 HEARING LOSS NOS  Comment:   Plan:     V04.81W Flu vaccine need  Comment:   Plan: INFLUENZA VACCINE (9 YO AND OLDER), NO LATEX,         NO PRESERVATIVE            V76.51D Screen for colon cancer  Comment: episode of BRBPR 6 months ago  Plan: GASTROENTEROLOGY REFERRAL, PEG-Electrolyte Soln        Soln              Barriers to Learning assessed: none. Patient verbalizes understanding of teaching and instructions.    Electronically Signed By:  Judith Blonder, MD  Associate Physician  [facility]  [phone]

## 2010-09-22 ENCOUNTER — Encounter: Payer: Self-pay | Admitting: Internal Medicine

## 2010-09-29 ENCOUNTER — Telehealth: Payer: Self-pay | Admitting: Internal Medicine

## 2010-09-29 ENCOUNTER — Encounter: Payer: Self-pay | Admitting: Nurse Practitioner

## 2010-09-29 NOTE — Telephone Encounter (Signed)
The GI dept has reviewed my request for colonoscopy and they recommend colonoscopy in 2016, ten yrs after her last colonoscopy which was normal.

## 2010-09-29 NOTE — Telephone Encounter (Signed)
Called relay service at [phone].  Left mess for pt to call us back via relay service. [name] MA

## 2010-09-29 NOTE — Telephone Encounter (Signed)
Attempted to call patient but no answer and was unable to leave msg due to the phone is a telephone text phone.  Please advice.    Phillips Grout, LVN

## 2010-09-29 NOTE — Telephone Encounter (Signed)
I believe you need to contact patient thru relay system for hearing impaired.

## 2010-09-30 NOTE — Telephone Encounter (Signed)
Noted  

## 2010-09-30 NOTE — Telephone Encounter (Signed)
Pt has been given message through relay service.    Also pt wanted to let you know that she saw Dr. 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 She sched to see him again on 10/06/10. Pt thinks that she may be put on Coumadin.

## 2010-10-06 ENCOUNTER — Other Ambulatory Visit: Payer: Self-pay | Admitting: Internal Medicine

## 2010-10-06 MED ORDER — METOPROLOL SUCCINATE ER 100 MG TABLET,EXTENDED RELEASE 24 HR
1.0000 | EXTENDED_RELEASE_TABLET | Freq: Every day | ORAL | Status: AC
Start: 2010-10-06 — End: 2011-10-06

## 2010-10-06 NOTE — Telephone Encounter (Signed)
Pharmacy fax request  Last fill date 06/29/10    Phillips Grout LVN

## 2010-10-10 ENCOUNTER — Other Ambulatory Visit: Payer: Self-pay | Admitting: Internal Medicine

## 2010-10-10 MED ORDER — TOLTERODINE ER 4 MG CAPSULE,EXTENDED RELEASE 24 HR
4.0000 mg | EXTENDED_RELEASE_CAPSULE | Freq: Every day | ORAL | Status: DC
Start: 2010-10-10 — End: 2010-10-11

## 2010-10-10 NOTE — Telephone Encounter (Signed)
Pharmacy fax , last refill 06/29/10 . Oaklyn Mans, MA II.

## 2010-10-11 ENCOUNTER — Telehealth: Payer: Self-pay | Admitting: Internal Medicine

## 2010-10-11 MED ORDER — TOLTERODINE ER 4 MG CAPSULE,EXTENDED RELEASE 24 HR
4.0000 mg | EXTENDED_RELEASE_CAPSULE | Freq: Every day | ORAL | Status: AC
Start: 2010-10-11 — End: 2011-11-11

## 2010-10-11 NOTE — Telephone Encounter (Signed)
Pt calling back to ask Dr Gregary Cromer if the [employer] faxed yesterday, can be re-faxed to [facility].  Pt says she is no longer in [place].

## 2010-10-11 NOTE — Telephone Encounter (Signed)
rx detrol [facility] [place].

## 2010-10-17 NOTE — Progress Notes (Addendum)
September 16, 2004 RE: LAVANNA, ROG    MR#: [id number]   DOB: 01-15-1936   Date of Service: 09/15/2004       [name], MD  [address], [address]  [place], [place] [address]    Dear Dr. Gregary Cromer:    Thank you for allowing me to see your patient, Melinda [name], in consultation for her urinary incontinence. Below you will find my initial evaluation.    History of Present Illness: This is a 75 year old gravida 4, para 3 who presents today complaining of symptoms of stress urinary incontinence and urge urinary incontinence. The patient is deaf and so the initial interview was conducted via writing whereas the discussion regarding assessment and recommendations was conducted by a sign language interpreter. The patient tells me that her incontinence problem began approximately 10 or 15 years ago and has waxed and waned over the years. She tells me that she leaks urine both with a sense of urgency on the way to the bathroom and this occurs on a daily basis, as well as cough, straining, or sneezing which occurs for the most part every time she cough or sneezes. Each time she leaks she leaks a few drops to enough to wet a pad. The patient has tried several medications for this problem including regular Ditropan, which she states did not help her problem, and Sudafed q.12h., which she feels perhaps has made a small difference. The patient did not fill out a urinary diary but tells me that she urinates several times per day. She could not quantitate more than this because she states it depends on how much she drinks. She has gotten up at night up to 4-5 times; however, currently it is about 2 times per night. She wears Poise pads. Again, the amount depends on how much she leaks. The patient has tried no other treatments for this problem. She tells me that she is not interested in any surgical treatment for this problem because she knows people who have had mediocre to poor outcomes from surgery so she is specifically  looking for conservative treatment. The patient tells me she drinks approximately 6-7 cups of milk, black tea, and juice per day, approximately 3-4 cups of these are black tea. She drinks no alcohol. She has some incontinence with intercourse both with penetration and with orgasm; however, she reports that her sex life has not suffered because of this problem. The patient denies any significant problems with her bowels. She has no constipation, no fecal incontinence. She also has no sensation of a vaginal bulge, that is, reports no prolapse.    Past Medical History: She reports that she has hypertension, early stage glaucoma. She has a history of "thyroid problems," which are now stabilized. She has insomnia which she relates to 35 years of working night shift. Her chart reports that she has osteoporosis, but the patient states she does not know that this is the case, but that she has scoliosis.    Past Gynecologic History: Menarche about age 50, menopause approximately age 43. She is a gravida 4, para 3, status post 3 spontaneous vaginal deliveries. Her last Pap smear was over a year ago. She has no history of abnormal Pap smears. She is planning to get a mammogram through her primary care doctor. The patient has a history of taking Premarin, but stopped taking it about 2 years ago.    Past Surgical History: She had removal of a goiter at age 55, removal of xanthoma tumor  at age 17, hernia fixed about 8 years ago and cataract surgery.    Medications: Include lovastatin, Toprol XL, Altace, Ambien, multivitamin, Sudafed. She reports allergies to novocaine and sulfa. Novocaine led to fainting and convulsions at the dentist, and sulfa led to a rash.    Blood Transfusion: She has no history of blood transfusion.    Habits: She denies any cigarette, alcohol, or drug use.    Family History: Significant for a great-grandmother, grandmother, aunt, and cousin who had diabetes, mother who had stomach cancer, father and brother  who had heart disease.    Social History: She is married. Works as an Event organiser. Lives with her husband in [place].    Review of Systems: Constitutional: She states that she has gained about 15 pounds in the last 3 years since moving to [place] from [place]. Respiratory: Negative. Cardiovascular: Negative. Hematologic: Negative. Neurologic: Negative. Psychiatric: Negative. Musculoskeletal: Negative. Integumentary: Negative. Gastrointestinal: Negative. Genitourinary: As noted above.    Physical Exam: This is a well appearing female in no acute distress. She does not speak. She has normal gait. She is alert and oriented x3. She is able to follow written and signed commands easily. She is 5 feet 7 inches tall, weighs 202 pounds, blood pressure 129/75, pulse 63, respiratory rate 18, temperature 98.4. Pain 0/10. Her affect is cheerful and engaged. On neurologic screen, strength is 5/5 in the lower extremity muscle groups bilaterally. Sensation to light touch and discrimination is normal in the lower extremities and on the perineum. Deep tendon reflexes are 2+ at the patellae, 1+ at the ankle and equal and symmetric. Levator function was difficult to elicit given the difficulty in communication, but her ability to isolate the levators was poor with both abdominal and gluteal muscles recruited. The strength of contraction was a flicker. In addition, the patient appeared quite anxious about the examination and was unable to relax her gluteal muscles during the exam. Abdomen was soft and nontender without masses or scars. On pelvic exam, her vulva appeared normal. Urethra and urethral meatus appeared normal. Vagina appeared normal. Cervix appeared normal except a small polyp was seen protruding from the cervical os. The patient was consented for removal of this polyp. She was given the information that removal may cause pain and bleeding with persistent bleeding afterwards. The patient consented to have  this performed. The polyp was grasped with a packing forceps and twisted off its stalk, sent for pathology. The bleeding was stopped with Monsel solution. The uterus was very difficult to evaluate given the patient's guarding and habitus. There were no obvious adnexal masses, but again this was difficult to evaluate given the above. Her perineum appeared normal. Rectal exam revealed normal sphincter tone. No masses. Q-Tip angle was 20 at rest, 46 with strain. Postvoid residual of urine was 20 cc of clear yellow urine and she had a positive empty cough stress test. There was no evidence of pelvic organ prolapse.    Assessment and Recommendations: This is a 74 year old with mixed urinary incontinence with a positive empty cough stress test and urethral hypermobility and a cervical polyp. Through the interpreter, we discussed the different treatment options for her incontinence including ensuring that her fluid intake does not exceed about 60 ounces per day, eliminating caffeine from her diet. I also recommended she see the physical therapist as this would address both types of her incontinence, and have put in a referral for this as the patient stated she was interested in pursuing  this option. We also discussed for her stress incontinence, the option of using an incontinence ring pessary as well as the possibility of surgery, but as noted above, the patient states she is not interested in any surgical treatment. With regards to her urge incontinence, we also discussed the option of trying different anticholinergic medications as long as this was okay with her ophthalmologist. At this point the patient prefers to proceed with physical therapy and to hold off on any of the other treatment options for now. With regards to the cervical polyp, I explained to the patient that she may have continued vaginal spotting, that should she have any heavy vaginal bleeding she should seek medical attention, that the polyp would be  sent to pathology and I would call her with the results.    Of note, the patient is concerned about whether she should take Evista for bone protection; however, the patient notes that she has not had a formal DEXA scan to determine whether she has osteopenia or osteoporosis. I suggested that she discuss this with her primary care doctor to see if this might be a reasonable option simply based on the patient's report.    A urinalysis and urine culture were sent to rule out any underlying etiology for her incontinence symptoms. The patient will return to me in approximately 2 months after she has proceeded with physical therapy to see how she has responded to treatment.    Thank you again for allowing me to see your patient. Should you have any questions, please feel free to contact me.    About 1 1/2 hours was spent with this patient (given need for communication via writing and interpreter). About 50 minutes was spent educating and counseling patient.    Again, thank you for allowing me to see your patient. Should you have any questions, please contact me.    Sincerely,    L Oval Linsey, MD  ASSISTANT PROFESSOR  DIVISION OF UROGYNECOLOGY  DEPARTMENT OF OBSTETRICS AND GYNECOLOGY   THIS WAS ELECTRONICALLY SIGNED - 09/19/2004 12:28 PM PST BY:                   [id number])    D: 09/16/2004 07:18 AM  T: 09/18/2004 11:03 AM  C#: [id number]

## 2010-10-24 ENCOUNTER — Telehealth: Payer: Self-pay | Admitting: Internal Medicine

## 2010-10-24 ENCOUNTER — Ambulatory Visit

## 2010-10-24 NOTE — Telephone Encounter (Signed)
Refer patient to [facility], she probably can call them to schedule.

## 2010-10-24 NOTE — Telephone Encounter (Signed)
Pt is calling through the [facility] to request a referral to get Accuputure on her knees due to the arthritis and the stiffness. Pt say she has occasional knee pain. A friend of her in [place] has one. Pt would like to be either seen here in [place] or [place]

## 2010-10-25 NOTE — Telephone Encounter (Signed)
Unable to contact pt. No answer.    Becki-please send referral to [facility]. Thanks.

## 2010-10-25 NOTE — Telephone Encounter (Signed)
Referral faxed to [name].

## 2010-10-28 ENCOUNTER — Ambulatory Visit: Admitting: Women's Health

## 2010-11-03 ENCOUNTER — Encounter: Payer: Self-pay | Admitting: Internal Medicine

## 2010-11-03 ENCOUNTER — Ambulatory Visit

## 2010-11-08 ENCOUNTER — Other Ambulatory Visit: Payer: Self-pay | Admitting: Internal Medicine

## 2010-11-08 MED ORDER — SIMVASTATIN 20 MG TABLET
20.0000 mg | ORAL_TABLET | Freq: Every day | ORAL | Status: DC
Start: 2010-11-08 — End: 2012-01-20

## 2010-11-08 NOTE — Telephone Encounter (Signed)
Pharmacy faxed request. Last fill date 08/01/10. Melburn Popper MA

## 2010-11-28 ENCOUNTER — Other Ambulatory Visit: Payer: Self-pay | Admitting: Internal Medicine

## 2010-11-28 MED ORDER — ZOLPIDEM 10 MG TABLET
1.0000 | ORAL_TABLET | Freq: Every day | ORAL | Status: DC
Start: 2010-11-28 — End: 2010-11-30

## 2010-11-28 NOTE — Telephone Encounter (Signed)
last refill date 10/27/2010 per pharmacy fax.

## 2010-11-29 ENCOUNTER — Telehealth: Payer: Self-pay | Admitting: Internal Medicine

## 2010-11-29 NOTE — Telephone Encounter (Signed)
Called ins and rec'd approval for Ambien 10 mg. For Dates 10-09-10 to 10/10/11.

## 2010-11-29 NOTE — Telephone Encounter (Signed)
Please start PA for ambien

## 2010-11-29 NOTE — Telephone Encounter (Signed)
ID # [id number]

## 2010-11-29 NOTE — Telephone Encounter (Signed)
[place] with [facility] calling to check on status of RX below and was informed of approval of Ambien. Lissa Hoard states pt would like to know if she can have a 90 day supply?

## 2010-11-29 NOTE — Telephone Encounter (Signed)
Zolpidem 10mg  required pa, call [phone], please advise

## 2010-11-30 MED ORDER — ZOLPIDEM 10 MG TABLET
1.0000 | ORAL_TABLET | Freq: Every day | ORAL | Status: DC
Start: 2010-11-30 — End: 2010-12-28

## 2010-11-30 NOTE — Telephone Encounter (Signed)
Addended by: Judith Blonder on: 11/30/2010 08:56 AM     Modules accepted: Orders

## 2010-11-30 NOTE — Telephone Encounter (Signed)
rx ambien 90d sent to pharmacy.

## 2010-12-01 NOTE — Telephone Encounter (Signed)
Received fax from [employer] regarding PA for Ambien 10mg , PA has been approved from 10/09/10 to 10/10/11.  Pharmacy has been notified of PA approval.    Phillips Grout, LVN

## 2010-12-08 ENCOUNTER — Ambulatory Visit

## 2010-12-12 ENCOUNTER — Encounter: Payer: Self-pay | Admitting: Women's Health

## 2010-12-12 ENCOUNTER — Ambulatory Visit: Admitting: Women's Health

## 2010-12-12 ENCOUNTER — Other Ambulatory Visit: Payer: Self-pay | Admitting: Women's Health

## 2010-12-12 VITALS — BP 138/76 | HR 57 | Ht 65.0 in | Wt 223.0 lb

## 2010-12-12 NOTE — Progress Notes (Signed)
Melinda [name] presents on 12/12/10 for her annual health screening and comprehensive evaluation.  She is a G4 P3.     Her current primary care doctor is: [name], MD.    Pt is deaf and is here without a sign interpreter:     Her current gynecologic concerns include:   Any problems today that involve your vagina, bladder, or breasts?  Patient: Just old thing inconvtience leaking? I misplaced the pessary I got here so I have not used it for more than a year I just use Poise pads. Will look for the pessary I [place] more than a [place] now I am back home the pessary is somewhere in my house    So I will wait before I try to refit you with a new one?  Patient: Costing money? Or will insurance pay again q ga   I don't know, they keep the fee portion of my visits blind to me. If a pessary worked well for you in the past it warrants trying again.     Today you are scheduled for an annual well woman exam. I can bring you back to refit the pessary which will give you a chance to find yours at home first.    Patient: I think it is a waste of money as I already have had two pessaries     Did they help control your leak?   Since they helped, if you are not able to find yours at home I should try to fit you again.     Patient: Maybe my husband threw it away as he does not know what it is    Even more reason to get you scheduled for a refitting visit    Pertinent Gynecologic History:  Current hormones or BCM - No  Currently sexually active: yes  Last episode of bleeding began: NA  Last pap smear: 2009    History of dysplasia: none  Last mammogram: 2012 - benign  Most recent DEXA: 2006 osteoporosis   Do you take medicine to help your bones?    Patient: sometimes  Most recent colonoscopy: 2006  Performs SBE: no  Exercise habits - a little - uses walker for long distances    Past Medical History   Diagnosis Date    Unspecified essential hypertension     Personal history of peptic ulcer disease      Unspecified circulatory system disorder     Abnormal coagulation profile     Personal history of venous thrombosis and embolism     Glaucoma     DVT     FH: migraines      resolved with retirement    Osteoporosis     Hypothyroid        Past Surgical History   Procedure Date    Remv 2nd cataract,corn-scler sectn 2004     Cataract removal    Surgery of breast capsule      xanthoma tumor removed at age 83    Thyroidectomy      for goiter at age 52    Lap,hernia repair proc,unlist 1989     umbical hernia    Colonoscopy 04/2005     tics       Current Outpatient Prescriptions on File Prior to Visit   Medication Sig Dispense Refill    CALTRATE PLUS 600-400 mg-unit PO Tablet         HCA ASPIRIN 81 mg PO Tab  Take 1 Tab by mouth every day.         IRON 325 mg (65 mg Iron) PO Tab Take 1 Tab by mouth 2 times daily.         Metoprolol (LOPRESSOR) 50 mg Tablet Take 1 Tab by mouth 2 times daily.  60 Tab  12    Metoprolol (TOPROL XL) 100 mg XL tablet Take 1 tablet by mouth every day.  30 tablet  12    MULTI-VITAMIN PO         OMEGA 3 PO         Ramipril (ALTACE) 10 mg Capsule Take 1 capsule by mouth every day.  90 capsule  4    Simvastatin (ZOCOR) 20 mg Tablet Take 1 tablet by mouth every evening.  90 tablet  4    Tolterodine (DETROL LA) 4 mg SR Capsule Take 1 capsule by mouth every day.  90 capsule  4    Zolpidem (AMBIEN) 10 mg Tablet Take 1 tablet by mouth every day at bedtime.  90 tablet  1       History     Social History    Marital Status: MARRIED     Spouse Name: Dennard Nip     Number of Children: 3    Years of Education: N/A     Occupational History    retired, Risk analyst      Social History Main Topics    Smoking status: Never Smoker     Smokeless tobacco: Never Used    Alcohol Use: No    Drug Use: No    Sexually Active: Yes -- Female partner(s)     Other Topics Concern    Not on file     Social History Narrative    No narrative on file       I have reviewed this patient's past medical  history, surgical history and family history as pertains to her gynecologic evaluation. Appropriate areas were updated to reflect this patient's current condition.    The following systems were reviewed with the patient in a yes/no format:    GENERAL:   Easily fatigued: yes  Weight loss: no  Sleeping well: yes   Weight gain: yes how much?  5lbs over past year    RESPIRATORY:   Spitting up blood: no  Difficulty breathing: no  Chronic bronchitis: no  Pneumonia: no    CARDIOVASCULAR:  Chest pain or angina: no  Difficulty walking 2 blocks: no  Palpitations: no  History of heart murmur: no    GASTROINTESTINAL:  Hemorrhoids: no  Recent change in bowel habits: no   New diarrhea or constipation  Heartburn:yes - on meds - yes  Bleeding with bowel movements:yes once over a year and a half ago   Black stools: no  Constipation: no  Diarrhea:no    GENITOURINARY:  Burning with urination: no  Frequency of urination: yes - depending on volumes drank or tea/coffee  Blood in urine: no  Frequent bladder/kidney: no   More than 3 infections  A year? Not sure - some years possible  Problem with leaking: yes - used pessary in past with improvement in symptoms  Symptoms of vaginal infection: no  Desire STD screening: gonorrhea, chlamydia, HIV, syphillis - declines    HEMATOLOGIC:  Anemia: not recently  Blood disease:no  History of deep venous thrombosis: yes - DVT on left    MUSCULOSKELETAL:   Painful joints: only stiff joints  Fibromyalgia: no  SKIN DISEASES:  Psoriasis:no  Eczema: no  Acne: no    IMMUNOLOGIC:  H.I.V.:no  Lupus Erythematosis: no  Scleroderma:no     PSYCHIATRIC:  Anxiety: not on meds  Depression: no  Bipolar disorder: no    HOSPITALIZATIONS :   other than childbirth birth:   Yes: blood clot in leg    NEUROLOGIC:  Seizures: no  Headaches: yes do you get vision changes with your headaches?    Patient: Vision in right eye changed just recently dried blood covering back of eye so I have to take 81 mg baby aspirin every  night before bedtime     Let me do your exam now    General appearance:  no apparent distress.    HEENT: within normal limits; thyroid without enlargement or nodularity.  Heart: Regular rate and rhythm; no murmur.  Lungs: Clear to auscultation  Skin: Without acne or abnormal hirsuitism  Breasts: No dimpling, discharge, or masses. Normal skin color. Skin under breasts dry and flaky  Abdomen: Soft, non-tender, no masses, no hepatosplenomegaly, no hernias, non-distended.    Pelvic:   Vulva - within normal limits, atrophic without lesions  Vagina - Estrogenized:  poorly  Pubic area TS - Gynecoid  Urethral meatus has no lesions  There are no suburethral masses.  There is not suburethral tenderness.  Cervix is without lesions. Pap collected, cervix difficult to keep in visual field due to vaginal relaxation  Uterus is 6 weeks sized.  Uterus is anteflexed  Uterus is mobile  Uterus is non-tender  Adnexa has a no mass.  Perineum is without lesions  Perianal area is without lesions    Psychiatric- alert and oriented x 3    Barriers to learning: none  Patient/Family understanding: verbalizes    Total time with patient was 45 minutes.      ASSESSMENT:   1. ANNUAL          Reviewed and demonstrated self breast exam technique   Mammogram for 2012 completed          If Pap normal repeat in 3 year, if abnormal follow ASCCP guidelines  2. Urinary incontinence   Return for pessary fitting     Requested a sign interpreter for next visit    Note Electronically Signed By:  Vernon Prey, RN/NP  WHNP-BC, MS  PI [phone]  Pager [phone]  My attending physician for this encounter is Dr. Claude Manges

## 2010-12-12 NOTE — Nursing Note (Signed)
>>   [facility]     Mon Dec 12, 2010  2:08 PM  Vital signs taken, allergies verified, screened for pain. Pharmacy confirmed. Last pap 04/01/08. Patient given brochure on "Gyn Cancers.Marland KitchenMarland KitchenWomen's Reproductive Cancers" and "Breast self-exam".     Carlos Levering MA II

## 2010-12-12 NOTE — Patient Instructions (Signed)
I will either mail  Your Pap results or review them with you at your next visit. I must run it through your insurance to make sure they cover it before I send it out. It can take up to 2 months for the results to return to me.       I requested a pessary fitting appointment with me along with a sign interpreter. My schedules are pretty full so it may be over a month before you are able to return. This is ok as it will give you a chance to find your pessary.     Lanier Prude NP  [phone]

## 2010-12-26 ENCOUNTER — Encounter: Payer: Self-pay | Admitting: Women's Health

## 2010-12-28 ENCOUNTER — Telehealth: Payer: Self-pay | Admitting: Internal Medicine

## 2010-12-28 MED ORDER — ZOLPIDEM 10 MG TABLET
1.0000 | ORAL_TABLET | Freq: Every day | ORAL | Status: DC
Start: 2010-12-28 — End: 2011-06-15

## 2010-12-28 NOTE — Telephone Encounter (Signed)
Pt is requesting a refill for the marked above med. She would like to get filled today.  Thanks

## 2011-01-16 ENCOUNTER — Ambulatory Visit

## 2011-01-18 ENCOUNTER — Encounter: Admitting: Internal Medicine

## 2011-01-25 ENCOUNTER — Ambulatory Visit: Admitting: Internal Medicine

## 2011-01-25 ENCOUNTER — Encounter: Payer: Self-pay | Admitting: Internal Medicine

## 2011-01-25 VITALS — BP 160/92 | HR 104 | Temp 97.0°F | Resp 16 | Ht 65.0 in | Wt 221.7 lb

## 2011-01-25 MED ORDER — FUROSEMIDE 20 MG TABLET
20.0000 mg | ORAL_TABLET | Freq: Every day | ORAL | Status: DC
Start: 2011-01-25 — End: 2011-05-16

## 2011-01-25 MED ORDER — POTASSIUM CHLORIDE ER 10 MEQ TABLET,EXTENDED RELEASE
10.0000 meq | EXTENDED_RELEASE_TABLET | Freq: Every day | ORAL | Status: DC
Start: 2011-01-25 — End: 2011-05-16

## 2011-01-25 NOTE — Patient Instructions (Signed)
Please do fasting blood tests prior to next appointment.

## 2011-01-26 NOTE — Progress Notes (Signed)
Chief Complaint   Patient presents with    Hypertension     4 month f/u    Allergic Reaction     Pt states she had an allergic reaction to an eye injection about 1 week ago and had to go to the ER due to pain in the right eye and nausea. Her eye pressure was up to 90 so they injected her eye and were able to get her pressure down to 11.    Swelling     Pt c/o feet swelling.       Subjective: Melinda [name] is a(n) 75yr old female who presents for follow up of hypertension.    Current symptoms are none.  She has been treated with meds for 4 month(s), and states she takes medications regularly. Side effects noted are none; which are not applicable.      Patient has not been checking ambulatory blood pressure.  She had right eye injection last week by Ophthalmology, developed bad reaction which led to increased eye pressure, eventually pressure in eye controlled, vision OK now.  C/o swelling in feet and ankles x 1 week. No pain, SOB.    ROS:  Constitutional: negative.  Eyes: negative.  Ears, Nose, Mouth, Throat: negative.  CV: negative.  Resp: negative.  GI: negative.  GU: negative.    History:  I did review patient's past medical and family/social history, no changes noted.    Objective:    General Appearance: skin warm, dry, and pink, cooperative.  Sign interpretor present.  Eyes:  conjunctivae and corneas clear. PERRL, EOM's intact. sclerae normal.  Neck:  Neck supple. No adenopathy, thyroid symmetric, normal size.  Heart:  normal rate and regular rhythm, no murmurs, clicks, or gallops.  Lungs: clear to auscultation.  Extremities:  peripheral edema 1+ ankles.  Skin:  Skin color, texture, turgor normal. No rashes or lesions.  Mental Status: Appearance/Cooperation: in no apparent distress, alert, afebrile and cooperative    Assessment: Essential hypertension adequately controlled.  See Diagnoses Section.    Plan: See Orders.  1)  Medication: continue current medication regimen unchanged  2)  Dietary sodium  restriction  3)  Regular aerobic exercise    401.9 HYPERTENSION NOS  (primary encounter diagnosis)  Comment:   Plan: COMPREHENSIVE METABOLIC PANEL, CBC AUTO +         REFLEX MANUAL DIFF, TSH WITH FREE T4 REFLEX            272.4 HYPERLIPIDEMIA NEC/NOS  Comment:   Plan: COMPREHENSIVE METABOLIC PANEL, LIPID PANEL WITH        DLDL REFLEX, TSH WITH FREE T4 REFLEX            733.00 OSTEOPOROSIS NOS  Comment:   Plan: VITAMIN D, 25 HYDROXY            389.9 HEARING LOSS NOS  Comment:   Plan:     782.3 Edema  Comment:   Plan: Furosemide (LASIX) 20 mg Tablet, Potassium         Chloride 10 mEq TbER SR Tablet, TSH WITH FREE         T4 REFLEX            Patient Instruction:  Reviewed risks of hypertension and principles of treatment.    See Patient Education and Orders Sections.      Barriers to Learning assessed: none. Patient verbalizes understanding of teaching and instructions.    Electronically Signed By:  Judith Blonder, MD  Associate Physician  [facility]  [phone]

## 2011-02-07 ENCOUNTER — Ambulatory Visit

## 2011-02-10 ENCOUNTER — Ambulatory Visit: Admitting: Women's Health

## 2011-02-10 ENCOUNTER — Encounter: Payer: Self-pay | Admitting: Women's Health

## 2011-02-10 VITALS — BP 165/89 | HR 55 | Temp 98.1°F | Ht 65.0 in | Wt 222.3 lb

## 2011-02-10 MED ORDER — CONJUGATED ESTROGENS 0.625 MG/GRAM VAGINAL CREAM
TOPICAL_CREAM | VAGINAL | Status: AC
Start: 2011-02-10 — End: 2012-02-10

## 2011-02-10 NOTE — Progress Notes (Signed)
PESSARY FITTING    INDICATION: Urinary Incontinence  Patient had hysterectomy: no.    Last documented pessary given was incontinence ring #3 - at annual exam 12 December 2010 patient had not used in over a year [place].    Pt has been unable to find original pessary    Pt requesting a check for UTI today, she has had multiple infections in the past and did not have frank symptoms. She is fearful of having an infection and getting really sick before she knows it is there.     Pessary Fittings:  FITTED WITH  Incontinence Ring, Size  3 with thick back support,  Successful pessary fit.    POST VOID RESIDUAL OF 35cc OBTAINED UNDER STERILE TECHNIQUE    Instructed on insertion and removal: yes.    Able to insert and remove: would not try in office - wanted to try at home   Tried several approaches to get patient to practice in clinic - she states she is too shy and nervous to do it here.    A/P:   Urinary incontinence    Fit with pessary today    Written pessary precautions given    Rx premarin cream 0.625mg  insert 0.5g inside vagina at bedtime the night the pessary is out #1 tube with 6 refills    RTC 1 month for pessary check or sooner for problems   Frequent UTIs    Cath ua/[place] sent to r/o UTI    INSTRUCTIONS:   Wash with mild soap and water  Advised to call for vaginal bleeding, pain or difficulty with urination or defecation  Printed instructions given  Remove pessary weekly overnight  Estrogen cream 0.5g inside vagina 1 night a week when pessary out  OTC water based lubricant for insertion (KY jelly, etc. ) for re insertion    Total visit 40 minutes of which greater than 50 percent of the time we spent counseling the patient.   Note Electronically Signed By:  Vernon Prey, RN/NP  WHNP-BC, MS  PI [phone]  Pager [phone]  My attending physician for this encounter is Dr. Claude Manges

## 2011-02-10 NOTE — Patient Instructions (Addendum)
Pessary Care:       Wash with warm soap and water       Do not flush down toilet       Remove overnight at least one night a week       Use a small amount lubricant to assist with pessary insertion  Call clinic at [phone] option 1 if:       Unable to empty bladder or complete bowel movement       Pain from pessary occurs       Unable to remove, replace, or retain pessary       Pink, brown, bloody, or bothersome vaginal discharge occurs    If you have a bladder infection, I will call you with an antibiotic prescription    I would like to see you in a month for a check. Please call sooner for problems.   Lanier Prude NP  [phone]

## 2011-02-10 NOTE — Nursing Note (Signed)
>>   Melinda [name]     Fri Feb 10, 2011  2:04 PM  Vital signs taken, allergies verified, screened for pain, pharmacy updated, chief complaint documented, tobacco hx verified. Pts last pap: 12/12/2010 WNL [id number] interpreter used for this visit.  [facility], [place]

## 2011-02-16 ENCOUNTER — Ambulatory Visit

## 2011-02-17 ENCOUNTER — Ambulatory Visit: Admitting: Women's Health

## 2011-02-17 ENCOUNTER — Encounter: Payer: Self-pay | Admitting: Women's Health

## 2011-02-17 VITALS — BP 146/75 | HR 61 | Temp 99.0°F | Ht 65.0 in | Wt 223.2 lb

## 2011-02-17 NOTE — Nursing Note (Signed)
>>   MONICA V Andrena Mews     Fri Feb 17, 2011  2:16 PM  Vital signs taken, allergies verified, screened for pain, pharmacy updated, chief complaint documented, tobacco hx verified. Pts last pap: 12/12/2010 WNL [id number]  [facility], [place]

## 2011-02-17 NOTE — Progress Notes (Signed)
PESSARY CHECK    Indication for pessary: Urinary Incontinence  Has had hysterectomy: no    Fit with thick incontinence ring #3 at last visit on 02/10/11    Benefits: Controls incontinence - would leak a little bit with cough or if urge to void is delayed too long.   Limits:   Difficult to remove. Could not reach to remove at home but was able to get it out this am at 0400 with strong valsalva over toilet.     Management: removed early today    Hormone cream: using almost daily    Lubricant: NA has not tried to replace. Came here today for pessary teaching as she became scared at home.   Problems: Difficult to remove    Satisfied with pessary: yes    Physical Exam:  Pessary out prior to arrival  Suture attached to pessary  No irritation or sign of infection on speculum exam  Pt given opportunity to replace pessary in office - Pt very shy   Wants to do at home even after offering to have staff leave the exam room  Pt given visual cues on how to fold and insert the pessary  Pessary replaced by me  Pt reassured by being able to feel the string   States she remembers having a string on past pessaries    Assessment: mixed incontinence - using pessary to reduce symptoms    Plan:Remove pessary once a week if able, replace string if it starts to show signs of wear. Written instruction on removal and replacement given.   Follow up in: 1 month or sooner for problems.     Greater than 50% of 35 minute encounter spent educating and counseling patient. Patient verbalized understanding to information and instructions given. A sign interpreter was used for the entire encounter.   Note Electronically Signed By:  Vernon Prey, RN/NP  WHNP-BC, MS  PI [phone]  Pager [phone]  My attending physician for this encounter is Dr. Claude Manges

## 2011-02-17 NOTE — Patient Instructions (Signed)
Pessary Care:       Wash with warm soap and water (hand wash with dish soap)       Do not flush down toilet       Remove overnight at least one night a week       Use a small amount lubricant to assist with pessary insertion (KY jelly or store brand equal)       If suture placed on pessary to ease removal, replace with unflavored              dental floss when sutures shows signs of wear  Call clinic at [phone] option 1 if:       Unable to empty bladder or complete bowel movement       Pain from pessary occurs       Unable to remove, replace, or retain pessary       Pink, brown, bloody, or bothersome vaginal discharge occurs    To remove the pessary, gently pull down on the string towards your knees.     To replace the pessary - make sure the string is on the ring part inside the notch.    Fold the pessary in half along the notches   Hold the pessary by the tip and dip the other end into the KY jelly   Hold your labia open    Push the pessary inside your vagina pointing the tip towards your rectum   Continue to push until the pessary slips into place   If you can, reach in with your index finger and move the knob to the front as it will be sitting on     the side after you insert it.       I would like to see you in a month. Please call for a sooner appt if you have problems.   Lanier Prude NP  [phone]

## 2011-03-06 ENCOUNTER — Other Ambulatory Visit: Payer: Self-pay | Admitting: Internal Medicine

## 2011-03-06 MED ORDER — METOPROLOL TARTRATE 50 MG TABLET
1.0000 | ORAL_TABLET | Freq: Two times a day (BID) | ORAL | Status: DC
Start: 2011-03-06 — End: 2012-04-08

## 2011-03-06 NOTE — Telephone Encounter (Signed)
Pharmacy fax request  Last filled 03/04/10  Franky Macho. RN

## 2011-03-08 ENCOUNTER — Telehealth: Payer: Self-pay | Admitting: Internal Medicine

## 2011-03-08 NOTE — Telephone Encounter (Signed)
Multiple issue:    Pt is calling with c/o old age arthritis in both knees, but more so in  Her right knee. Pt wanted to ask you if you knew about a knee injection called SYNVISC ONE? Would this help her knees? She saw an add that Dr. Carren Rang of [facility] gives those kind of treatments.    Also pt wanted you to know that at her  OBGYN appt she noticed her BP was higher than usual and is wonder why? Should she be given additional or be given a higher dosage of BP meds? Pt couldn't tell me how high her BP was. Please see her OV with OBGYN.

## 2011-03-09 NOTE — Telephone Encounter (Signed)
Called patient, no answer and no answering machine. Will try later.  Melinda [name] L MA

## 2011-03-09 NOTE — Telephone Encounter (Signed)
1.  Patient referred to Sports Medicine for eval of knees.  Xray of knees ordered, have this done at [facility].    2. BP high 165/89 on 6/29, good 146/75 on 7/6.  Continue taking the same BP medications.

## 2011-03-09 NOTE — Telephone Encounter (Signed)
Called patient, no answer.  Cristy Colmenares L MA

## 2011-03-10 NOTE — Telephone Encounter (Signed)
Called patient, no answer.  Melinda [name] L MA

## 2011-03-13 NOTE — Telephone Encounter (Signed)
Called patient, no answer and no answering service.  Melinda [name] L MA

## 2011-03-13 NOTE — Telephone Encounter (Signed)
Cannot reach patient via [facility] [employer].  Kyandra Mcclaine L MA

## 2011-03-17 ENCOUNTER — Ambulatory Visit

## 2011-03-20 ENCOUNTER — Ambulatory Visit: Admitting: Women's Health

## 2011-03-20 NOTE — Telephone Encounter (Signed)
Called patient through [employer]. Left message for patient to call back and left message several times.  Markela Wee L MA

## 2011-03-22 ENCOUNTER — Telehealth: Payer: Self-pay | Admitting: Internal Medicine

## 2011-03-22 NOTE — Telephone Encounter (Signed)
Called again with relay and patient cont not to answer the phone, LVM for patient to go to the nearest ER with sx that she explained below and to call our office when she received msg.  Msg sent via relay.    Phillips Grout, LVN

## 2011-03-22 NOTE — Telephone Encounter (Signed)
Attempted to contact patient but no answer will try again later.    Phillips Grout, LVN

## 2011-03-22 NOTE — Telephone Encounter (Signed)
Pt is calling thru [facility] health stating that she just came back from [facility], and Pt is calling c/o having lower middle abdominal pain (pain level right now 1-2/10), diarrhea that shot out like a volcano on Monday. Pt says she took OTC Pepto-Bismol which help subsided the diarrhea, but then she started to have black stool. Pt DENIES having any diarrhea at the moment or nausea, vomiting. Pt requesting a call back with advice. Thanks.

## 2011-03-22 NOTE — Telephone Encounter (Signed)
If she is having black stools, she needs evaluation in the ER now.  Thank you.

## 2011-03-22 NOTE — Telephone Encounter (Signed)
Pt calling back thru [employer] health, says she just arrived back from [facility]. Pt verb understanding. DONE. Thanks.    Ardis Hughs [facility]

## 2011-03-23 ENCOUNTER — Ambulatory Visit

## 2011-03-23 NOTE — Telephone Encounter (Signed)
Pt verified name, DOB, MRN and/or address at initiation of call.    Pt calling back thru relay health. Pt was informed of Dr Sunday Corn message below, and verb understanding. Pt states she will be going to [facility] in [place] for eval b/c she is still having black stools. DONE. Thanks.    Melinda [name] [facility]

## 2011-03-27 ENCOUNTER — Telehealth: Payer: Self-pay | Admitting: Internal Medicine

## 2011-03-27 NOTE — Telephone Encounter (Signed)
Pt states she continues to have black stool since last wk with some stomach pain at one time and no longer a issue (stomach), Pt states she has no blood in stool or any other sx at this time and would like to know if she needs to be seen at ER? Please Advise

## 2011-03-27 NOTE — Telephone Encounter (Signed)
Spoke with pt via [employer] health. Pt says she had 1 episode of black stool today that was solid. Pt has no abdominal pain, fever, chills, nausea, vomiting. Pt has no CP, SOB, or dizziness. Pt does feel a little weak. Pt took Peptobismal on Friday and Imodium yesterday. Pt had a HA last night but no HA today.    Pt never went to ER she wanted to wait until Dr. Gregary Cromer returned because [facility] would not have her records. Pt says the black stool started last week and was initially loose. Pt had been in [place] and had eaten at several different restaurants.    Please advise.    Beverly Sessions, RN

## 2011-03-27 NOTE — Telephone Encounter (Signed)
She should go to nearest [place] lab today, no need to come here.

## 2011-03-27 NOTE — Telephone Encounter (Signed)
Pt has been given below MD'S message,but pt say that since it is already late in the day, can she wait and get the lab work done here at the cln in the morning? Pt was given other labs in her area, but wants to come here. Pt will be waiting for a response before closing time.  Thanks

## 2011-03-27 NOTE — Telephone Encounter (Signed)
Line busy x  3. Dylyn Mclaren M Emer Onnen, RN

## 2011-03-27 NOTE — Telephone Encounter (Signed)
Pt has been given below message and says that it is too late to go to the [employer] and she doesn't like going to strange places, so she will stay alive and wait to have the lab work done here at  [facility] on Tuesday morning.

## 2011-03-27 NOTE — Telephone Encounter (Signed)
Cbc ordered, have it done today.

## 2011-03-29 ENCOUNTER — Ambulatory Visit: Admitting: Obstetrics & Gynecology

## 2011-03-29 ENCOUNTER — Telehealth: Payer: Self-pay | Admitting: Internal Medicine

## 2011-03-29 NOTE — Telephone Encounter (Signed)
Pt's husband Dennard Nip is calling for the results of lab test done at [facility] 03/28/11.   He would like a phone call back w/the results and instructions, pt is awaiting, they can also be faxed to:     [phone]

## 2011-03-29 NOTE — Telephone Encounter (Addendum)
Pt was given below MD's message/result with a verbal understanding. Pt has sched appt on 03/31/11 to further discuss her issue. Pt thinks that she may have a swallowing problem. She wants to know if you can figure out why she continues to have the black stool?  Also pt wants you to take a look at her endoscopy and colonscopy results.Marland Kitchen

## 2011-03-29 NOTE — Telephone Encounter (Signed)
CBC is normal.  She has not had significant GI bleeding if any at all.

## 2011-03-31 ENCOUNTER — Encounter: Payer: Self-pay | Admitting: Internal Medicine

## 2011-03-31 ENCOUNTER — Ambulatory Visit: Admitting: Internal Medicine

## 2011-03-31 VITALS — BP 108/64 | HR 60 | Temp 96.4°F | Resp 16 | Ht 64.0 in | Wt 219.1 lb

## 2011-03-31 NOTE — Nursing Note (Signed)
>>   Jenene Slicker, LVN     Fri Mar 31, 2011  2:09 PM  Vital signs taken, allergies verified, screened for pain, pharmacy verified.    Jenene Slicker, LVN

## 2011-03-31 NOTE — Progress Notes (Signed)
Chief Complaint   Patient presents with    Swallowing Problem       Subjective:   Melinda [name] is a(n) 75yr old female who presents for a new problem of diarrhea, dark stool.    She states that since onset 1 week(s) ago, the symptoms are gradually improving.  Associated symptoms include diarrhea which was black last week, 3-4 per day, later stool more greenish, she eats out frequently and gets loose stool , no abd pain, FC, nausea/vomiting .  She has had similar symptoms in the past.    ROS:  Constitutional: negative.  Eyes: negative.  Ears, Nose, Mouth, Throat: negative.  CV: negative.  Resp: negative.  GI: negative.  GU: negative.    History:  I did review patient's past medical and family/social history, no changes noted.    Objective:    General Appearance: skin warm, dry, and pink, cooperative. Sign interpretor present.  Neck:  Neck supple. No adenopathy, thyroid symmetric, normal size.  Heart:  normal rate and regular rhythm, no murmurs, clicks, or gallops.  Lungs: clear to auscultation.  Abdomen: BS normal.  Abdomen soft, non-tender.  No masses or organomegaly.  Extremities:  no cyanosis, clubbing, or edema.  Skin:  Skin color, texture, turgor normal. No rashes or lesions.    Assessment: viral gastroenteritis   See Diagnoses Section.    Plan:  Clear liq diet, advance as tolerated  See Orders.    787.91 Diarrhea  (primary encounter diagnosis)  Comment:   Plan: COMPREHENSIVE METABOLIC PANEL, CBC AUTO +         REFLEX MANUAL DIFF, AMYLASE, LIPASE            401.9 HYPERTENSION NOS  Comment:   Plan:     530.81 GERD (gastroesophageal reflux disease)  Comment:   Plan:     V06.1 Need for Tdap vaccination  Comment:   Plan: TDAP VACCINE IM  (ADACEL)            V05.4 Need for zoster vaccination  Comment:   Plan: ZOSTER VACCINE (LIVE)              Patient Instruction:  See Patient Education section.      Barriers to Learning assessed: none. Patient verbalizes understanding of teaching and  instructions.    Electronically Signed By:  Judith Blonder, MD  Associate Physician  [facility], [address]  [phone]

## 2011-03-31 NOTE — Telephone Encounter (Signed)
Patient already discussed with MD at her office visit.  Karinne Schmader L MA

## 2011-04-04 ENCOUNTER — Telehealth: Payer: Self-pay | Admitting: Internal Medicine

## 2011-04-04 NOTE — Telephone Encounter (Signed)
Pt's insurance is medicare and b/c b/s FEP.  Called [phone] and spoke with Lyla Son regarding shingle vaccine and Tdap vaccine coverage.  Per Lyla Son, shingle vaccine will be covered at 100% for pt at the doctors office.  Called pt via re-lay service, left message for pt. to call back. [name] MA

## 2011-04-05 ENCOUNTER — Encounter: Payer: Self-pay | Admitting: Internal Medicine

## 2011-04-05 NOTE — Telephone Encounter (Signed)
Pt's shingles/TDAP has been added to pt's appt on 04/26/11. DONE. Thanks.

## 2011-04-05 NOTE — Telephone Encounter (Signed)
Spoke with [facility].  Per Misty Stanley, pt request that we send a letter to them regarding shingle vaccine and Tdap vaccine coverage.  Done. [name] MA

## 2011-04-06 ENCOUNTER — Encounter: Payer: Self-pay | Admitting: Internal Medicine

## 2011-04-06 ENCOUNTER — Ambulatory Visit

## 2011-04-06 ENCOUNTER — Ambulatory Visit: Admitting: Physical Medicine & Rehabilitation

## 2011-04-06 VITALS — HR 120 | Temp 97.9°F | Resp 24 | Wt 218.3 lb

## 2011-04-06 NOTE — Patient Instructions (Signed)
Please talk to Dr. Gregary Cromer if you would like to talk with a nutritionist about a dietary evaluation.

## 2011-04-06 NOTE — Nursing Note (Signed)
>>   Donnella Sham, LVN     Thu Apr 06, 2011  3:18 PM  Vital signs taken and screened for pain.   Pharmacy, immunization status, and allergies verified.   Donnella Sham, LVN
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
## 2011-04-26 ENCOUNTER — Ambulatory Visit

## 2011-04-26 ENCOUNTER — Encounter: Payer: Self-pay | Admitting: Internal Medicine

## 2011-04-26 ENCOUNTER — Ambulatory Visit: Admitting: Internal Medicine

## 2011-04-26 VITALS — BP 130/84 | HR 60 | Temp 97.0°F | Resp 18 | Wt 218.0 lb

## 2011-04-26 NOTE — Progress Notes (Signed)
Chief Complaint   Patient presents with    Test Results     Labs       Subjective: Melinda [name] is a(n) 75yr old female who presents for follow up of hypertension.    Current symptoms are none.  She has been treated with meds for 4 month(s), and states she takes medications regularly. Side effects noted are none; which are not applicable.      Patient has not been checking ambulatory blood pressure.  C/o green stool, formed, no abd pain, diarrhea, blood, nausea/vomiting.    ROS:  Constitutional: negative.  Eyes: negative.  Ears, Nose, Mouth, Throat: negative.  CV: negative.  Resp: negative.    History:  I did review patient's past medical and family/social history, no changes noted.    Objective:    General Appearance: skin warm, dry, and pink, cooperative. Sign interpretor present.  Neck:  negative.  Heart:  normal rate and regular rhythm, no murmurs, clicks, or gallops.  Lungs: clear to auscultation.  Extremities:  no cyanosis, clubbing, or edema.  Skin:  Skin color, texture, turgor normal. No rashes or lesions.  Mental Status: Appearance/Cooperation: in no apparent distress, alert and cooperative    Assessment: Essential hypertension adequately controlled.  See Diagnoses Section.    Plan: See Orders.  1)  Medication: continue current medication regimen unchanged  2)  Dietary sodium restriction  3)  Regular aerobic exercise    401.9 HYPERTENSION NOS  (primary encounter diagnosis)  Comment:   Plan:     272.4 HYPERLIPIDEMIA NEC/NOS  Comment:   Plan:     733.00 OSTEOPOROSIS NOS  Comment:   Plan:     389.9 HEARING LOSS NOS  Comment:   Plan:     792.1 Abnormal stool color  Comment: benign  Plan: reassured.      Patient Instruction:  Reviewed risks of hypertension and principles of treatment.    See Patient Education and Orders Sections.      Barriers to Learning assessed: none. Patient verbalizes understanding of teaching and instructions.

## 2011-04-26 NOTE — Nursing Note (Signed)
>>   Melinda [name]     Wed Apr 26, 2011  3:03 PM   SHINGLES Immunization VIS documentation(s) were given to patient to review. All questions were answered and the patient consented to the Immunization(s) being given. Patient allergies were reviewed and no contraindications were found. The immunization(s) were given as ordered. The patient was observed for any immediate reactions to the vaccine. None were observed.    PT SIGN THE ABN FORM.     TDAP Immunization VIS documentation(s) were given to patient to review. All questions were answered and the patient consented to the Immunization(s) being given. Patient allergies were reviewed and no contraindications were found. The immunization(s) were given as ordered. The patient was observed for any immediate reactions to the vaccine. None were observed.      Georgia Dom, LVN    >> Melinda [name]     Wed Apr 26, 2011  2:27 PM  Vital signs taken, allergies verified, screened for pain, Pharmacy Verfied  Georgia Dom, [place]

## 2011-05-16 ENCOUNTER — Other Ambulatory Visit: Payer: Self-pay | Admitting: Internal Medicine

## 2011-05-16 MED ORDER — POTASSIUM CHLORIDE ER 10 MEQ TABLET,EXTENDED RELEASE
10.0000 meq | EXTENDED_RELEASE_TABLET | Freq: Every day | ORAL | Status: AC
Start: 2011-05-16 — End: 2012-05-15

## 2011-05-16 MED ORDER — FUROSEMIDE 20 MG TABLET
20.0000 mg | ORAL_TABLET | Freq: Every day | ORAL | Status: AC
Start: 2011-05-16 — End: 2012-05-15

## 2011-05-16 NOTE — Telephone Encounter (Signed)
Pharmacy Fax request  Last fill date 04/15/2011    Shraga Custard, LVN

## 2011-06-15 ENCOUNTER — Other Ambulatory Visit: Payer: Self-pay | Admitting: Internal Medicine

## 2011-06-15 MED ORDER — ZOLPIDEM 10 MG TABLET
1.0000 | ORAL_TABLET | Freq: Every day | ORAL | Status: DC
Start: 2011-06-15 — End: 2011-11-13

## 2011-06-15 NOTE — Telephone Encounter (Signed)
[facility], pt requesting Zolpidem 10-5mg , one hs, last filled 05/13/11, thanks

## 2011-08-14 ENCOUNTER — Other Ambulatory Visit: Payer: Self-pay | Admitting: Internal Medicine

## 2011-08-16 NOTE — Telephone Encounter (Signed)
[facility] refill request. Last filled date1/05/2011 .  Addelyn Alleman MA

## 2011-08-16 NOTE — Telephone Encounter (Signed)
[facility] refill request for Prilosec.    Last fill date 08/23/2010    Georgia Dom, LVN

## 2011-09-04 DIAGNOSIS — Z23 Encounter for immunization: Secondary | ICD-10-CM | POA: Diagnosis not present

## 2011-09-18 ENCOUNTER — Ambulatory Visit (INDEPENDENT_AMBULATORY_CARE_PROVIDER_SITE_OTHER): Payer: Federal, State, Local not specified - PPO | Admitting: Internal Medicine

## 2011-09-18 DIAGNOSIS — M79609 Pain in unspecified limb: Secondary | ICD-10-CM | POA: Diagnosis not present

## 2011-09-18 DIAGNOSIS — M79604 Pain in right leg: Secondary | ICD-10-CM | POA: Insufficient documentation

## 2011-09-18 DIAGNOSIS — D649 Anemia, unspecified: Secondary | ICD-10-CM

## 2011-09-18 DIAGNOSIS — I10 Essential (primary) hypertension: Secondary | ICD-10-CM

## 2011-09-18 DIAGNOSIS — R195 Other fecal abnormalities: Secondary | ICD-10-CM

## 2011-09-18 DIAGNOSIS — R609 Edema, unspecified: Secondary | ICD-10-CM | POA: Diagnosis not present

## 2011-09-18 LAB — POCT CBC
Granulocyte percent: 42.9 %G (ref 37–80)
HCT, POC: 42.5 % (ref 37.7–47.9)
MCV: 95.2 fL (ref 80–97)
POC LYMPH PERCENT: 50.5 %L — AB (ref 10–50)
RBC: 4.46 M/uL (ref 4.04–5.48)

## 2011-09-18 NOTE — Progress Notes (Signed)
This 75 year old female presents with numerous problems and numerous complaints. She lives in [place] and her doctors are there. She is here visiting her daughter for several months. She has chronic edema of both legs and has had a prior DVT in the left leg. Over the last 3 months she has noticed increased swelling in both legs and over the last month in the left leg in particular. She has begun to notice increased pain in the left leg as well. She has had chronic skin color changes due to venous stasis for many years. She chooses not to wear compression stockings because they don't fit. She currently takes Lasix with potassium for this, and is on medication for hypertension, but denies any cardiac event or heart failure. She currently has no shortness of breath, chest pain, or palpitations.  Over the past few weeks she has noticed a change in stool color though cannot define this as blood. She had a normal colonoscopy 3 years ago and was told to return for 10 years. There is a question of colon cancer in the family history that is uncertain. There is no abdominal pain with this. She had an endoscopy which revealed chronic ulcerations and irritation of the stomach and esophagus and she is currently on medication for this. She has had some difficult discomfort in the epigastric area in recent weeks. Her appetite has remained good. She has had no weight loss fever or night sweats. She alternates between constipation and loose stools. There is a history of anemia in the past but it is unclear whether this was related to GI blood loss.   Review of systems; she is deaf and speaks through sign language interpreter by her daughter. HEENT is negative. Respiratory has no complaint of shortness of breath or dyspnea on exertion, though her exercise level is nil. Cardiovascular is consistent with present illness. Gastrointestinal is also consistent with present illness. Genitourinary has her taking Detrol for urinary  urgency and frequency. She is also on Premarin. Neuro has no additional symptoms.  Physical exam:  Vital signs show mild hypertension. She is overweight.there is no tachycardia. There is no thyromegaly. The heart has a regular rate and rhythm without a murmur rub or gallop. The lungs are clear. The lower extremities exhibit signs of hyperpigmentation and venous stasis bilaterally. Both legs have pitting edema. The left calf is considerably as larger than the right. Both are tender to palpation without cords or masses. Peripheral pulses are intact and the feet are warm. Both feet are dusky without signs of infarct. Neuro is intact  Problem #1 edema of extremities Problem #2 venous stasis  Problem #3 hypertension Problem #4 hyperlipidemia Problem #5 GERD Problem #6 change in stool color Problem #7 deaf kneading interpreter Problem #8 history of anemia  Plan CBC, metabolic profile Doppler of the lower extremities to rule out DVT Support stockings Hemosure to rule out GI blood loss  We will call the daughter with plans for the Doppler and with blood results

## 2011-09-19 ENCOUNTER — Telehealth: Payer: Self-pay | Admitting: *Deleted

## 2011-09-19 ENCOUNTER — Encounter: Payer: Self-pay | Admitting: Internal Medicine

## 2011-09-19 LAB — COMPREHENSIVE METABOLIC PANEL
Albumin: 4.3 g/dL (ref 3.5–5.2)
BUN: 17 mg/dL (ref 6–23)
CO2: 24 mEq/L (ref 19–32)
Calcium: 9.4 mg/dL (ref 8.4–10.5)
Chloride: 104 mEq/L (ref 96–112)
Glucose, Bld: 101 mg/dL — ABNORMAL HIGH (ref 70–99)
Potassium: 4.1 mEq/L (ref 3.5–5.3)

## 2011-09-19 NOTE — Telephone Encounter (Signed)
Called  Patient to schedule lower arterial duplex per Dr. Yong Channel , but I am unable to locate patient because her phone is disconnected. I call Dr. Yong Channel office @ [phone] to see if they can locate patient. Darl Pikes willl try to find patient and call me back  2/5/123.

## 2011-09-19 NOTE — Telephone Encounter (Signed)
The patient lives with her daughter Raiford Simmonds..our patient

## 2011-09-20 ENCOUNTER — Other Ambulatory Visit: Payer: Self-pay | Admitting: Cardiology

## 2011-09-20 DIAGNOSIS — R609 Edema, unspecified: Secondary | ICD-10-CM

## 2011-09-20 NOTE — Telephone Encounter (Signed)
Melissa Murphy, try calling [phone].  This is her daughters home number.

## 2011-09-21 LAB — HEMOCCULT GUIAC POC 1CARD (OFFICE)

## 2011-09-22 ENCOUNTER — Encounter (INDEPENDENT_AMBULATORY_CARE_PROVIDER_SITE_OTHER): Payer: Medicare Other | Admitting: Cardiology

## 2011-09-22 DIAGNOSIS — R609 Edema, unspecified: Secondary | ICD-10-CM

## 2011-09-22 DIAGNOSIS — M79609 Pain in unspecified limb: Secondary | ICD-10-CM | POA: Diagnosis not present

## 2011-09-26 ENCOUNTER — Other Ambulatory Visit: Payer: Self-pay | Admitting: Internal Medicine

## 2011-09-26 ENCOUNTER — Encounter: Payer: Self-pay | Admitting: Cardiology

## 2011-09-26 ENCOUNTER — Telehealth: Payer: Self-pay

## 2011-09-26 NOTE — Telephone Encounter (Signed)
This patient has a message that I printed out but I am unsure that it was done

## 2011-09-27 ENCOUNTER — Other Ambulatory Visit: Payer: Self-pay | Admitting: Internal Medicine

## 2011-09-27 ENCOUNTER — Encounter: Payer: Self-pay | Admitting: Cardiology

## 2011-09-27 MED ORDER — RAMIPRIL 10 MG CAPSULE
10.0000 mg | ORAL_CAPSULE | Freq: Every day | ORAL | Status: DC
Start: 2011-09-27 — End: 2011-11-12

## 2011-09-27 NOTE — Telephone Encounter (Signed)
Pt calling with [facility] and states she has requested refills on above med numerous times and has had no reply.

## 2011-10-03 ENCOUNTER — Encounter: Payer: Self-pay | Admitting: Cardiology

## 2011-11-01 ENCOUNTER — Ambulatory Visit (INDEPENDENT_AMBULATORY_CARE_PROVIDER_SITE_OTHER): Payer: Medicare Other | Admitting: Family Medicine

## 2011-11-01 ENCOUNTER — Encounter: Payer: Self-pay | Admitting: Family Medicine

## 2011-11-01 ENCOUNTER — Ambulatory Visit: Payer: Medicare Other

## 2011-11-01 VITALS — BP 185/76 | HR 54 | Temp 98.2°F | Resp 16 | Ht 64.0 in | Wt 215.0 lb

## 2011-11-01 DIAGNOSIS — R600 Localized edema: Secondary | ICD-10-CM

## 2011-11-01 DIAGNOSIS — R05 Cough: Secondary | ICD-10-CM | POA: Diagnosis not present

## 2011-11-01 DIAGNOSIS — J40 Bronchitis, not specified as acute or chronic: Secondary | ICD-10-CM | POA: Diagnosis not present

## 2011-11-01 DIAGNOSIS — R059 Cough, unspecified: Secondary | ICD-10-CM

## 2011-11-01 DIAGNOSIS — R609 Edema, unspecified: Secondary | ICD-10-CM

## 2011-11-01 LAB — POCT CBC
Granulocyte percent: 49.3 %G (ref 37–80)
HCT, POC: 40.5 % (ref 37.7–47.9)
Hemoglobin: 13.1 g/dL (ref 12.2–16.2)
Lymph, poc: 2.5 (ref 0.6–3.4)
MCH, POC: 30 pg (ref 27–31.2)
MCHC: 32.3 g/dL (ref 31.8–35.4)
MCV: 92.6 fL (ref 80–97)
MID (cbc): 0.6 (ref 0–0.9)
MPV: 9.2 fL (ref 0–99.8)
POC Granulocyte: 3 (ref 2–6.9)
POC LYMPH PERCENT: 41.6 %L (ref 10–50)
POC MID %: 9.1 %M (ref 0–12)
Platelet Count, POC: 200 10*3/uL (ref 142–424)
RBC: 4.37 M/uL (ref 4.04–5.48)
RDW, POC: 13.7 %
WBC: 6.1 10*3/uL (ref 4.6–10.2)

## 2011-11-01 MED ORDER — AZITHROMYCIN 250 MG PO TABS: ORAL_TABLET | ORAL | Status: AC

## 2011-11-01 MED ORDER — BENZONATATE 200 MG PO CAPS: 200.0000 mg | ORAL_CAPSULE | Freq: Two times a day (BID) | ORAL | Status: AC | PRN

## 2011-11-01 NOTE — Progress Notes (Signed)
76 yo deaf woman.

## 2011-11-01 NOTE — Progress Notes (Signed)
Deaf woman who presents with 3 days of progressive severe cough without fever. She's having some respiratory distress and bilateral pedal edema. She's accompanied by her daughter who is signing for her. She also has a granddaughter with her. She had no nausea vomiting or chest pain.  Objective: Elderly woman in no acute distress eating a banana  Oropharynx: Edentulous, no erythema and no exudates with the exception of pieces of banana.  Neck: Supple, no adenopathy, no thyromegaly Aref chest: Diffuse rhonchi  Heart: Regular, rapid, no gallop heard.  Extremities: 2+ edema   [facility] reading (PRIMARY) by  Dr. Milus Glazier: Chest x-ray shows left atelectatic changes, quality compromised by obesity  Assessment: Pedal edema, bronchitis    Plan: Z-Pak, Tessalon. Return in 2-3 days if not feeling much better.

## 2011-11-12 ENCOUNTER — Other Ambulatory Visit: Payer: Self-pay | Admitting: Internal Medicine

## 2011-11-13 MED ORDER — ZOLPIDEM 10 MG TABLET
1.0000 | ORAL_TABLET | Freq: Every day | ORAL | Status: DC
Start: 2011-11-13 — End: 2011-11-15

## 2011-11-13 NOTE — Telephone Encounter (Signed)
Beverley Fiedler, please fax.

## 2011-11-13 NOTE — Telephone Encounter (Signed)
last refill date not given for Zolpidem 10mg per pharmacy fax.

## 2011-11-13 NOTE — Telephone Encounter (Signed)
The [facility] and filed    Saber Dickerman, LVN

## 2011-11-15 ENCOUNTER — Telehealth: Payer: Self-pay | Admitting: Internal Medicine

## 2011-11-15 MED ORDER — ZOLPIDEM 10 MG TABLET
1.0000 | ORAL_TABLET | Freq: Every day | ORAL | Status: DC
Start: 2011-11-15 — End: 2011-12-18

## 2011-11-15 NOTE — Telephone Encounter (Signed)
rx written. Please fax.

## 2011-11-15 NOTE — Telephone Encounter (Signed)
Pt currently in [place], will be there for a few months and requesting new Rx for ZOLPIDEM TARTRATE 10 mg, #90, [facility]. Need to send a NEW Rx. Pt needs to p/u today.

## 2011-11-15 NOTE — Telephone Encounter (Signed)
The [facility] and filed    Tashea Othman, LVN

## 2011-11-21 DIAGNOSIS — I8310 Varicose veins of unspecified lower extremity with inflammation: Secondary | ICD-10-CM | POA: Diagnosis not present

## 2011-11-21 DIAGNOSIS — M79609 Pain in unspecified limb: Secondary | ICD-10-CM | POA: Diagnosis not present

## 2011-11-22 DIAGNOSIS — M79609 Pain in unspecified limb: Secondary | ICD-10-CM | POA: Diagnosis not present

## 2011-11-22 DIAGNOSIS — I8310 Varicose veins of unspecified lower extremity with inflammation: Secondary | ICD-10-CM | POA: Diagnosis not present

## 2011-12-18 ENCOUNTER — Other Ambulatory Visit: Payer: Self-pay | Admitting: Internal Medicine

## 2011-12-18 MED ORDER — ZOLPIDEM 10 MG TABLET
1.0000 | ORAL_TABLET | Freq: Every day | ORAL | Status: DC
Start: 2011-12-18 — End: 2012-05-16

## 2011-12-18 NOTE — Telephone Encounter (Signed)
Yes please. Leamon Arnt MA

## 2011-12-18 NOTE — Telephone Encounter (Signed)
Is this a refill?

## 2011-12-19 ENCOUNTER — Telehealth: Payer: Self-pay | Admitting: Internal Medicine

## 2011-12-19 DIAGNOSIS — I8310 Varicose veins of unspecified lower extremity with inflammation: Secondary | ICD-10-CM | POA: Diagnosis not present

## 2011-12-19 NOTE — Telephone Encounter (Signed)
[facility] and filed    Sharonann Malbrough, LVN

## 2011-12-19 NOTE — Telephone Encounter (Signed)
Please start PA for ambien

## 2011-12-19 NOTE — Telephone Encounter (Signed)
Rec'd [facility] stating that they need a PA started for Zolpidem (AMBIEN) 10 mg Tablet. Please Advise, Thank You.    Pt Insurance Info:  [employer]  Pt ID # [id number]  # [phone]    Georgia Dom, LVN

## 2011-12-20 ENCOUNTER — Telehealth: Payer: Self-pay | Admitting: Internal Medicine

## 2011-12-20 DIAGNOSIS — Z961 Presence of intraocular lens: Secondary | ICD-10-CM | POA: Diagnosis not present

## 2011-12-20 DIAGNOSIS — H40059 Ocular hypertension, unspecified eye: Secondary | ICD-10-CM | POA: Diagnosis not present

## 2011-12-20 NOTE — Telephone Encounter (Signed)
Faxed and filed   Monaye Blackie MA

## 2011-12-20 NOTE — Telephone Encounter (Signed)
PA completed, please review and sign. Placed on counter in pod b.

## 2011-12-20 NOTE — Telephone Encounter (Signed)
Pt spouse is calling checking on the status of the PA for the Zolpidem. He would like to get that sent to the Pharmacy as soon as possible.    If you need to reach pt spouse he can be emailed at [email or web address]

## 2011-12-20 NOTE — Telephone Encounter (Signed)
FYI: Pt wanted to inform you that she's still currently in [place] babysitting her grand kids. FYI.

## 2011-12-20 NOTE — Telephone Encounter (Signed)
PA completed. please fax.

## 2011-12-20 NOTE — Telephone Encounter (Signed)
Noted  

## 2012-01-01 ENCOUNTER — Other Ambulatory Visit: Payer: Self-pay | Admitting: Internal Medicine

## 2012-01-02 NOTE — Telephone Encounter (Signed)
last refill date 07/11/11 for Detrol la 4mg  per pharmacy fax.

## 2012-01-09 DIAGNOSIS — Z961 Presence of intraocular lens: Secondary | ICD-10-CM | POA: Diagnosis not present

## 2012-01-09 DIAGNOSIS — H40059 Ocular hypertension, unspecified eye: Secondary | ICD-10-CM | POA: Diagnosis not present

## 2012-01-09 DIAGNOSIS — H3530 Unspecified macular degeneration: Secondary | ICD-10-CM | POA: Diagnosis not present

## 2012-01-20 ENCOUNTER — Other Ambulatory Visit: Payer: Self-pay | Admitting: Internal Medicine

## 2012-01-22 NOTE — Telephone Encounter (Signed)
[facility] request  Last fill date 10/24/2011.    Talbert Trembath, LVN

## 2012-01-31 ENCOUNTER — Encounter (INDEPENDENT_AMBULATORY_CARE_PROVIDER_SITE_OTHER): Payer: Medicare Other | Admitting: Ophthalmology

## 2012-01-31 DIAGNOSIS — H43819 Vitreous degeneration, unspecified eye: Secondary | ICD-10-CM | POA: Diagnosis not present

## 2012-01-31 DIAGNOSIS — H35039 Hypertensive retinopathy, unspecified eye: Secondary | ICD-10-CM | POA: Diagnosis not present

## 2012-01-31 DIAGNOSIS — H3530 Unspecified macular degeneration: Secondary | ICD-10-CM | POA: Diagnosis not present

## 2012-01-31 DIAGNOSIS — I10 Essential (primary) hypertension: Secondary | ICD-10-CM

## 2012-02-01 ENCOUNTER — Telehealth: Payer: Self-pay | Admitting: Internal Medicine

## 2012-02-01 MED ORDER — TOLTERODINE 2 MG TABLET
1.0000 | ORAL_TABLET | Freq: Two times a day (BID) | ORAL | Status: DC
Start: 2012-02-01 — End: 2012-07-21

## 2012-02-01 NOTE — Telephone Encounter (Signed)
Pt calling through [facility] states RX DETROL LA 4 mg (name brand) is to expensive, so pt is requesting the GENERIC BRAND for DETROL LA 4 mg [facility]. Please Advise. Thanks.

## 2012-02-01 NOTE — Telephone Encounter (Signed)
There is not a generic for Detrol LA 4 mg once daily. The closest substitute is Detrol 2 mg twice daily, which I sent.  Please inform her.

## 2012-02-01 NOTE — Telephone Encounter (Signed)
Phone # is disconnected. Leamon Arnt MA

## 2012-02-05 NOTE — Telephone Encounter (Signed)
Try calling the Pt Home # and it was still disconnected.    [facility] to see if the [facility] of the Rx Detrol 2 mg. And the Pt did P/U the Rx.     Melinda [name], [place]

## 2012-03-11 ENCOUNTER — Encounter: Payer: Self-pay | Admitting: Family Medicine

## 2012-03-11 ENCOUNTER — Ambulatory Visit (INDEPENDENT_AMBULATORY_CARE_PROVIDER_SITE_OTHER): Payer: Medicare Other | Admitting: Family Medicine

## 2012-03-11 VITALS — BP 187/99 | HR 58 | Temp 99.3°F | Resp 16 | Ht 65.0 in | Wt 211.4 lb

## 2012-03-11 DIAGNOSIS — R42 Dizziness and giddiness: Secondary | ICD-10-CM

## 2012-03-11 DIAGNOSIS — I10 Essential (primary) hypertension: Secondary | ICD-10-CM | POA: Diagnosis not present

## 2012-03-11 DIAGNOSIS — R5383 Other fatigue: Secondary | ICD-10-CM

## 2012-03-11 DIAGNOSIS — R5381 Other malaise: Secondary | ICD-10-CM | POA: Diagnosis not present

## 2012-03-11 DIAGNOSIS — R531 Weakness: Secondary | ICD-10-CM

## 2012-03-11 DIAGNOSIS — Z1211 Encounter for screening for malignant neoplasm of colon: Secondary | ICD-10-CM

## 2012-03-11 LAB — POCT CBC
Granulocyte percent: 45.5 %G (ref 37–80)
HCT, POC: 44.8 % (ref 37.7–47.9)
Hemoglobin: 13.8 g/dL (ref 12.2–16.2)
Lymph, poc: 2.9 (ref 0.6–3.4)
MCH, POC: 29.6 pg (ref 27–31.2)
MCHC: 30.8 g/dL — AB (ref 31.8–35.4)
MCV: 96 fL (ref 80–97)
MID (cbc): 0.5 (ref 0–0.9)
MPV: 9.3 fL (ref 0–99.8)
POC Granulocyte: 2.8 (ref 2–6.9)
POC LYMPH PERCENT: 46.9 %L (ref 10–50)
POC MID %: 7.6 % (ref 0–12)
Platelet Count, POC: 212 10*3/uL (ref 142–424)
RBC: 4.67 M/uL (ref 4.04–5.48)
RDW, POC: 13.4 %
WBC: 6.2 10*3/uL (ref 4.6–10.2)

## 2012-03-11 LAB — POCT UA - MICROSCOPIC ONLY
Bacteria, U Microscopic: NEGATIVE
Casts, Ur, LPF, POC: NEGATIVE
Crystals, Ur, HPF, POC: NEGATIVE
Mucus, UA: NEGATIVE
RBC, urine, microscopic: NEGATIVE
WBC, Ur, HPF, POC: NEGATIVE
Yeast, UA: NEGATIVE

## 2012-03-11 LAB — POCT URINALYSIS DIPSTICK
Bilirubin, UA: NEGATIVE
Blood, UA: NEGATIVE
Glucose, UA: NEGATIVE
Ketones, UA: NEGATIVE
Leukocytes, UA: NEGATIVE
Nitrite, UA: NEGATIVE
Protein, UA: NEGATIVE
Spec Grav, UA: <=1.005
Urobilinogen, UA: 0.2
pH, UA: 5

## 2012-03-11 MED ORDER — RAMIPRIL 10 MG PO CAPS: 10.0000 mg | ORAL_CAPSULE | Freq: Two times a day (BID) | ORAL | Status: DC

## 2012-03-11 NOTE — Progress Notes (Signed)
[facility]:  Office Visit  Chief Complaint:  Chief Complaint  Patient presents with  . Hypertension    224/78  . Headache  . Dizziness  . Extremity Weakness    in legs    HPI: Melissa Murphy is a 76 y.o. female who complains of dizziness, generalized weakness x 1 week. Deneis CP, SOB. She is hearing impaired and based on what I can illicit she has been on generic Detrol and then all her sxs started happening soon after. [facility] and it was high. Patient is from [place] but has been in [place] taking care of her grandchild for the last 6 months.    Past Medical History  Diagnosis Date  . GERD (gastroesophageal reflux disease)   . Hypertension   . Hyperlipidemia   . Glaucoma   . Urinary incontinence   . Edema    History reviewed. No pertinent past surgical history. History   Social History  . Marital Status: Divorced    Spouse Name: N/A    Number of Children: N/A  . Years of Education: N/A   Social History Main Topics  . Smoking status: Never Smoker   . Smokeless tobacco: None  . Alcohol Use: No  . Drug Use: No  . Sexually Active: None   Other Topics Concern  . None   Social History Narrative  . None   No family history on file. Allergies  Allergen Reactions  . Clindamycin/Lincomycin   . Procaine Hcl   . Sulfa Antibiotics    Prior to Admission medications   Medication Sig Start Date End Date Taking? Authorizing Provider  aspirin 81 MG tablet Take 160 mg by mouth daily.   Yes Historical Provider, MD  dorzolamide-timolol (COSOPT) 22.3-6.8 MG/ML ophthalmic solution Place 1 drop into the right eye 2 (two) times daily.   Yes Historical Provider, MD  metoprolol (LOPRESSOR) 50 MG tablet Take 50 mg by mouth 2 (two) times daily.   Yes Historical Provider, MD  omeprazole (PRILOSEC) 20 MG capsule Take 20 mg by mouth 2 (two) times daily.   Yes Historical Provider, MD  ramipril (ALTACE) 10 MG capsule TAKE 1  CAPSULE BY MOUTH EVERY DAY. 09/26/11  Yes Pattricia Boss, PA-C  simvastatin (ZOCOR) 20 MG tablet Take 20 mg by mouth every evening.   Yes Historical Provider, MD  tolterodine (DETROL) 2 MG tablet Take 2 mg by mouth 2 (two) times daily.   Yes Historical Provider, MD  zolpidem (AMBIEN) 10 MG tablet Take 10 mg by mouth at bedtime as needed.   Yes Historical Provider, MD  ASA-APAP-CAFF-CA GLUC PO Take by mouth.    Historical Provider, MD  dextromethorphan-guaiFENesin (MUCINEX DM) 30-600 MG per 12 hr tablet Take 1 tablet by mouth every 12 (twelve) hours.    Historical Provider, MD  estrogens, conjugated, (PREMARIN) 1.25 MG tablet Take 1.25 mg by mouth daily.    Historical Provider, MD  furosemide (LASIX) 20 MG tablet Take 20 mg by mouth 2 (two) times daily.    Historical Provider, MD  potassium chloride (K-DUR,KLOR-CON) 10 MEQ tablet Take 10 mEq by mouth 2 (two) times daily.    Historical Provider, MD     ROS: The patient denies fevers, chills, night sweats, unintentional weight loss, chest pain, palpitations, wheezing, dyspnea on exertion, nausea, vomiting, abdominal pain, dysuria, hematuria, melena, numbness, or tingling.   All other systems have been reviewed and were otherwise negative with the  exception of those mentioned in the HPI and as above.    PHYSICAL EXAM: Filed Vitals:   03/11/12 1816  BP: 187/99  Pulse: 58  Temp:   Resp:    Filed Vitals:   03/11/12 1714  Height: 5\' 5"  (1.651 m)  Weight: 211 lb 6.4 oz (95.89 kg)   Body mass index is 35.18 kg/(m^2).  General: Alert, no acute distress HEENT:  Normocephalic, atraumatic, oropharynx patent. EOMI, PERRLA, fundoscopic exam nl. TM nl Cardiovascular:  Regular rate and rhythm, no rubs murmurs or gallops.  No Carotid bruits, radial pulse intact. No pedal edema. No JVD Respiratory: Clear to auscultation bilaterally.  No wheezes, rales, or rhonchi.  No cyanosis, no use of accessory musculature GI: No organomegaly, abdomen is soft and  non-tender, positive bowel sounds.  No masses. Skin: No rashes. Neurologic: Facial musculature symmetric. Psychiatric: Patient is appropriate throughout our interaction. Lymphatic: No cervical lymphadenopathy Musculoskeletal: Gait intact.   LABS: Results for orders placed in visit on 03/11/12  POCT CBC      Component Value Range   WBC 6.2  4.6 - 10.2 K/uL   Lymph, poc 2.9  0.6 - 3.4   POC LYMPH PERCENT 46.9  10 - 50 %L   MID (cbc) 0.5  0 - 0.9   POC MID % 7.6  0 - 12 %M   POC Granulocyte 2.8  2 - 6.9   Granulocyte percent 45.5  37 - 80 %G   RBC 4.67  4.04 - 5.48 M/uL   Hemoglobin 13.8  12.2 - 16.2 g/dL   HCT, POC 46.9  62.9 - 47.9 %   MCV 96.0  80 - 97 fL   MCH, POC 29.6  27 - 31.2 pg   MCHC 30.8 (*) 31.8 - 35.4 g/dL   RDW, POC 52.8     Platelet Count, POC 212  142 - 424 K/uL   MPV 9.3  0 - 99.8 fL  POCT UA - MICROSCOPIC ONLY      Component Value Range   WBC, Ur, HPF, POC neg     RBC, urine, microscopic neg     Bacteria, U Microscopic neg     Mucus, UA neg     Epithelial cells, urine per micros 1-2     Crystals, Ur, HPF, POC neg     Casts, Ur, LPF, POC neg     Yeast, UA neg    POCT URINALYSIS DIPSTICK      Component Value Range   Color, UA yellow     Clarity, UA clear     Glucose, UA neg     Bilirubin, UA neg     Ketones, UA neg     Spec Grav, UA <=1.005     Blood, UA neg     pH, UA 5.0     Protein, UA neg     Urobilinogen, UA 0.2     Nitrite, UA neg     Leukocytes, UA Negative       EKG/XRAY:   Primary read interpreted by Dr. Conley Rolls at [facility].   ASSESSMENT/PLAN: Encounter Diagnoses  Name Primary?  . Dizziness Yes  . Weakness    Dizziness? -Related to new medications vs poorly controlled HTN vs less likely inner ear. Advise to hold generic detrol . If continue then go back to brand name Detrol. BP was Non-orthostatics.  Elevated BP will increase Ramipril from 10 to 20 mg daily. She will get a blood pressure cuff and measure her BP, if >  140/90 consistently  then she will return for further evaluation.  Refer for colonscopy and endoscopy.  CMP pending   Verdis Koval PHUONG, DO 03/11/2012 6:45 PM

## 2012-03-12 LAB — COMPREHENSIVE METABOLIC PANEL
Albumin: 4.1 g/dL (ref 3.5–5.2)
Alkaline Phosphatase: 68 U/L (ref 39–117)
BUN: 16 mg/dL (ref 6–23)
CO2: 25 mEq/L (ref 19–32)
Calcium: 9.7 mg/dL (ref 8.4–10.5)
Chloride: 107 mEq/L (ref 96–112)
Glucose, Bld: 82 mg/dL (ref 70–99)
Potassium: 4.2 mEq/L (ref 3.5–5.3)

## 2012-03-12 LAB — COMPREHENSIVE METABOLIC PANEL WITH GFR
ALT: 16 U/L (ref 0–35)
AST: 26 U/L (ref 0–37)
Creat: 0.79 mg/dL (ref 0.50–1.10)
Sodium: 139 meq/L (ref 135–145)
Total Bilirubin: 0.4 mg/dL (ref 0.3–1.2)
Total Protein: 6.7 g/dL (ref 6.0–8.3)

## 2012-03-15 ENCOUNTER — Encounter: Payer: Self-pay | Admitting: Gastroenterology

## 2012-03-18 DIAGNOSIS — I8310 Varicose veins of unspecified lower extremity with inflammation: Secondary | ICD-10-CM | POA: Diagnosis not present

## 2012-03-20 ENCOUNTER — Telehealth: Payer: Self-pay | Admitting: Internal Medicine

## 2012-03-20 NOTE — Telephone Encounter (Signed)
I don't see any record of a recent INR, and Coumadin is not on her medication list.  Please inform them. Thank you.

## 2012-03-20 NOTE — Telephone Encounter (Signed)
-  verified name, DOB, MRN and/or address at initiation of call.    Pt calling from [place] and will have a vein procedure(close up Vein) done by a  Dr Molli Hazard. Per pt MD wants to know the dosage of the Warfarin/coumadin pt is taking and records when she had the blood clot? Please fax.    Fax# [phone]  Phone# [phone]

## 2012-03-20 NOTE — Telephone Encounter (Signed)
Have attempted to reach patient, but number disconnected. Tried MD office, below, but office  already closed.Yetta Barre Alexzavier Girardin, R.N.

## 2012-03-21 NOTE — Telephone Encounter (Signed)
Spoke with Donnamae Jude at Dr Delsa Bern office will fax  Request.

## 2012-03-21 NOTE — Telephone Encounter (Signed)
Received medical records request from [facility]. Pt under the name of Melinda [name].    LVM for pt  to call clinic back to confirm that she goes by this name.

## 2012-03-22 NOTE — Telephone Encounter (Signed)
LVm to call clinic back.

## 2012-03-27 ENCOUNTER — Encounter: Payer: Self-pay | Admitting: Family Medicine

## 2012-03-29 NOTE — Telephone Encounter (Signed)
LVM to call clinic back.

## 2012-04-01 NOTE — Telephone Encounter (Signed)
FYI-  No response from patient.    Shakeda Pearse M Mavrik Bynum, RN

## 2012-04-08 ENCOUNTER — Telehealth: Payer: Self-pay | Admitting: Internal Medicine

## 2012-04-08 MED ORDER — METOPROLOL TARTRATE 50 MG TABLET
1.0000 | ORAL_TABLET | Freq: Two times a day (BID) | ORAL | Status: DC
Start: 2012-04-08 — End: 2017-02-19

## 2012-04-08 NOTE — Telephone Encounter (Signed)
Pt calling requesting refill for metoprolol. Please send to above pharmacy.    Metoprolol (LOPRESSOR) 50 mg Tablet 60 tablet 12 03/06/2011 04/04/2012     Sig - Route:Take 1 tablet by mouth 2 times daily. - ORAL    Class:Pharmacy

## 2012-04-08 NOTE — Telephone Encounter (Signed)
rx metoprolol

## 2012-04-16 ENCOUNTER — Encounter: Payer: Self-pay | Admitting: Family Medicine

## 2012-04-18 ENCOUNTER — Ambulatory Visit (AMBULATORY_SURGERY_CENTER): Payer: Medicare Other | Admitting: *Deleted

## 2012-04-18 VITALS — Ht 64.0 in | Wt 214.2 lb

## 2012-04-18 DIAGNOSIS — Z800 Family history of malignant neoplasm of digestive organs: Secondary | ICD-10-CM

## 2012-04-18 DIAGNOSIS — Z1211 Encounter for screening for malignant neoplasm of colon: Secondary | ICD-10-CM | POA: Diagnosis not present

## 2012-04-18 MED ORDER — NA SULFATE-K SULFATE-MG SULF 17.5-3.13-1.6 GM/177ML PO SOLN: 1.0000 | Freq: Once | ORAL | Status: DC

## 2012-04-18 NOTE — Progress Notes (Signed)
No allergy to eggs or soy products  LMOM at interpretor's office that one will be needed the day of the procedure

## 2012-04-25 DIAGNOSIS — I8310 Varicose veins of unspecified lower extremity with inflammation: Secondary | ICD-10-CM | POA: Diagnosis not present

## 2012-04-30 ENCOUNTER — Ambulatory Visit (INDEPENDENT_AMBULATORY_CARE_PROVIDER_SITE_OTHER): Payer: Medicare Other | Admitting: Emergency Medicine

## 2012-04-30 VITALS — BP 171/77 | HR 55 | Temp 98.1°F | Resp 18 | Ht 64.25 in | Wt 214.2 lb

## 2012-04-30 DIAGNOSIS — I160 Hypertensive urgency: Secondary | ICD-10-CM | POA: Insufficient documentation

## 2012-04-30 DIAGNOSIS — I10 Essential (primary) hypertension: Secondary | ICD-10-CM

## 2012-04-30 MED ORDER — METOPROLOL TARTRATE 50 MG PO TABS: 100.0000 mg | ORAL_TABLET | Freq: Two times a day (BID) | ORAL | Status: DC

## 2012-04-30 NOTE — Progress Notes (Signed)
Date:  04/30/2012   Name:  Janece Laidlaw   DOB:  April 20, 1936   MRN:  [id number] Gender: female Age: 76 y.o.  PCP:  Tonye Pearson, MD    Chief Complaint: Hypertension   History of Present Illness:  Melissa Murphy is a 76 y.o. pleasant patient who presents with the following:  History of hypertension.  Just had her altace increased she said.  Is scheduled for a colonoscopy and later a vein procedure that requires anesthesia that will not be done unless her BP is reduced.  Earlier today measured 223/90 at a drug store cuff.  Here is 177/70.  Not symptomatic.  Still having swelling of feet and ankles.  Denies chest pain or neuro symptoms, shortness of breath.  Patient Active Problem List  Diagnosis  . Edema  . Leg pain, bilateral  . Stool color abnormal  . Anemia  . Hypertension    Past Medical History  Diagnosis Date  . GERD (gastroesophageal reflux disease)   . Hypertension   . Hyperlipidemia   . Glaucoma   . Urinary incontinence   . Edema   . Anemia   . Arthritis   . DVT (deep venous thrombosis)     in eye, and leg  . Osteoporosis   . Ulcer   . Hernia   . Deaf     Past Surgical History  Procedure Date  . Cataract extraction     both eyes  . Goiter removed     age 43  . Xanthoma tumor removed   . Refractive surgery     History  Substance Use Topics  . Smoking status: Never Smoker   . Smokeless tobacco: Not on file  . Alcohol Use: Yes     occasionally    Family History  Problem Relation Age of Onset  . Colon cancer Mother   . Esophageal cancer Neg Hx   . Rectal cancer Neg Hx   . Stomach cancer Neg Hx     Allergies  Allergen Reactions  . Clindamycin/Lincomycin Itching  . Procaine Hcl     "seize" and "go to into fits"  . Sulfa Antibiotics     fever    Medication list has been reviewed and updated.  Outpatient Prescriptions Prior to Visit  Medication Sig Dispense Refill  . ASA-APAP-CAFF-CA GLUC PO Take by mouth.       Marland Kitchen aspirin 81 MG tablet Take 160 mg by mouth daily.      . Calcium Carbonate (CALTRATE 600 PO) Take by mouth daily.      . Cholecalciferol (VITAMIN D-3 PO) Take by mouth daily.      . dorzolamide-timolol (COSOPT) 22.3-6.8 MG/ML ophthalmic solution Place 1 drop into the right eye 2 (two) times daily.      . furosemide (LASIX) 20 MG tablet Take 20 mg by mouth as needed.       . Multiple Vitamins-Minerals (CENTRUM PO) Take by mouth daily.      . Na Sulfate-K Sulfate-Mg Sulf (SUPREP BOWEL PREP) SOLN Take 1 kit by mouth once.  354 mL  0  . Omega-3 Fatty Acids (FISH OIL PO) Take by mouth daily.      Marland Kitchen omeprazole (PRILOSEC) 20 MG capsule Take 20 mg by mouth 2 (two) times daily.      . potassium chloride (K-DUR,KLOR-CON) 10 MEQ tablet Take 10 mEq by mouth 2 (two) times daily.      . ramipril (ALTACE) 10 MG capsule Take 1 capsule (10  mg total) by mouth 2 (two) times daily.  60 capsule  5  . simvastatin (ZOCOR) 20 MG tablet Take 20 mg by mouth every evening.      . tolterodine (DETROL) 2 MG tablet Take 2 mg by mouth daily.       Marland Kitchen zolpidem (AMBIEN) 10 MG tablet Take 10 mg by mouth at bedtime as needed.      . metoprolol (LOPRESSOR) 50 MG tablet Take 50 mg by mouth 2 (two) times daily.      Marland Kitchen dextromethorphan-guaiFENesin (MUCINEX DM) 30-600 MG per 12 hr tablet Take 1 tablet by mouth every 12 (twelve) hours.      . IRON PO Take by mouth daily.        Review of Systems:  As per HPI, otherwise negative.    Physical Examination: Filed Vitals:   04/30/12 1805  BP: 171/77  Pulse: 55  Temp: 98.1 F (36.7 C)  Resp: 18   Filed Vitals:   04/30/12 1805  Height: 5' 4.25" (1.632 m)  Weight: 214 lb 3.2 oz (97.16 kg)   Body mass index is 36.48 kg/(m^2). Ideal Body Weight: Weight in (lb) to have BMI = 25: 146.5   GEN: WDWN, NAD, Non-toxic, A & O x 3 HEENT: Atraumatic, Normocephalic. Neck supple. No masses, No LAD. Ears and Nose: No external deformity. CV: RRR, No M/G/R. No JVD. No thrill. No  extra heart sounds. PULM: CTA B, no wheezes, crackles, rhonchi. No retractions. No resp. distress. No accessory muscle use. ABD: S, NT, ND, +BS. No rebound. No HSM. EXTR: No c/c.  Marked peripheral edema NEURO Normal gait.  PSYCH: Normally interactive. Conversant. Not depressed or anxious appearing.  Calm demeanor.    Assessment and Plan: Hypertension not under control Increase metoprolol Follow up as needed  Carmelina Dane, MD I have reviewed and agree with documentation. See 2/13 and 7/13 OVs Robert P. Merla Riches, M.D.

## 2012-05-02 ENCOUNTER — Encounter: Payer: Self-pay | Admitting: Gastroenterology

## 2012-05-02 ENCOUNTER — Ambulatory Visit (AMBULATORY_SURGERY_CENTER): Payer: Medicare Other | Admitting: Gastroenterology

## 2012-05-02 VITALS — BP 186/104 | HR 62 | Temp 98.3°F | Resp 16 | Ht 64.0 in | Wt 214.0 lb

## 2012-05-02 DIAGNOSIS — Z1211 Encounter for screening for malignant neoplasm of colon: Secondary | ICD-10-CM | POA: Diagnosis not present

## 2012-05-02 DIAGNOSIS — I82409 Acute embolism and thrombosis of unspecified deep veins of unspecified lower extremity: Secondary | ICD-10-CM | POA: Diagnosis not present

## 2012-05-02 DIAGNOSIS — Z800 Family history of malignant neoplasm of digestive organs: Secondary | ICD-10-CM

## 2012-05-02 DIAGNOSIS — R609 Edema, unspecified: Secondary | ICD-10-CM | POA: Diagnosis not present

## 2012-05-02 DIAGNOSIS — D126 Benign neoplasm of colon, unspecified: Secondary | ICD-10-CM | POA: Diagnosis not present

## 2012-05-02 DIAGNOSIS — K648 Other hemorrhoids: Secondary | ICD-10-CM

## 2012-05-02 DIAGNOSIS — K5730 Diverticulosis of large intestine without perforation or abscess without bleeding: Secondary | ICD-10-CM | POA: Diagnosis not present

## 2012-05-02 DIAGNOSIS — I10 Essential (primary) hypertension: Secondary | ICD-10-CM | POA: Diagnosis not present

## 2012-05-02 MED ORDER — SODIUM CHLORIDE 0.9 % IV SOLN: 500.0000 mL | INTRAVENOUS | Status: DC

## 2012-05-02 NOTE — Op Note (Addendum)
[facility] [address] [place], [address]   COLONOSCOPY PROCEDURE REPORT  PATIENT: Melissa Murphy, Melissa Murphy  MR#: [id number] BIRTHDATE: 1936/04/05 , 75  yrs. old GENDER: Female ENDOSCOPIST: Louis Meckel, MD REFERRED NF:[id number] Merla Riches, M.D. PROCEDURE DATE:  05/02/2012 PROCEDURE:   Colonoscopy with snare polypectomy ASA CLASS:   Class II INDICATIONS:high risk screening , patient's immediate family history of colon cancer (mother). MEDICATIONS: MAC sedation, administered by CRNA and propofol (Diprivan) 250mg  IV  DESCRIPTION OF PROCEDURE:   After the risks benefits and alternatives of the procedure were thoroughly explained, informed consent was obtained.  A digital rectal exam revealed no abnormalities of the rectum.   The LB [id number]  endoscope was introduced through the anus and advanced to the cecum, which was identified by both the appendix and ileocecal valve. No adverse events experienced.   The quality of the prep was Suprep good  The instrument was then slowly withdrawn as the colon was fully examined.      COLON FINDINGS: A sessile polyp measuring 3 mm in size was found in the distal transverse colon.  A polypectomy was performed using snare cautery.  The resection was complete and the polyp tissue was completely retrieved.   Mild diverticulosis was noted in the sigmoid colon and descending colon.   Internal hemorrhoids were found.   The colon mucosa was otherwise normal.  Retroflexed views revealed no abnormalities. The time to cecum=5 minutes 05 seconds. Withdrawal time=9 minutes 46 seconds.  The scope was withdrawn and the procedure completed. COMPLICATIONS: There were no complications.  ENDOSCOPIC IMPRESSION: 1.   Sessile polyp measuring 3 mm in size was found in the distal transverse colon; polypectomy was performed using snare cautery 2.   Mild diverticulosis was noted in the sigmoid colon and descending colon 3.   Internal  hemorrhoids 4.   The colon mucosa was otherwise normal  RECOMMENDATIONS: 1.  colonoscopy 5 years. 2.  followup hemeoccults 3.   Check hemeoccults in 1 week (h/o black stool)   eSigned:  Louis Meckel, MD 05/02/2012 11:25 AM Revised: 05/02/2012 11:25 AM  cc:

## 2012-05-02 NOTE — Progress Notes (Signed)
Patient did not experience any of the following events: a burn prior to discharge; a fall within the facility; wrong site/side/patient/procedure/implant event; or a hospital transfer or hospital admission upon discharge from the facility. (G8907) Patient did not have preoperative order for IV antibiotic SSI prophylaxis. (G8918)  

## 2012-05-02 NOTE — Patient Instructions (Signed)
YOU HAD AN ENDOSCOPIC PROCEDURE TODAY AT THE [facility]: Refer to the procedure report that was given to you for any specific questions about what was found during the examination.  If the procedure report does not answer your questions, please call your gastroenterologist to clarify.  If you requested that your care partner not be given the details of your procedure findings, then the procedure report has been included in a sealed envelope for you to review at your convenience later.  YOU SHOULD EXPECT: Some feelings of bloating in the abdomen. Passage of more gas than usual.  Walking can help get rid of the air that was put into your GI tract during the procedure and reduce the bloating. If you had a lower endoscopy (such as a colonoscopy or flexible sigmoidoscopy) you may notice spotting of blood in your stool or on the toilet paper. If you underwent a bowel prep for your procedure, then you may not have a normal bowel movement for a few days.  DIET: Your first meal following the procedure should be a light meal and then it is ok to progress to your normal diet.  A half-sandwich or bowl of soup is an example of a good first meal.  Heavy or fried foods are harder to digest and may make you feel nauseous or bloated.  Likewise meals heavy in dairy and vegetables can cause extra gas to form and this can also increase the bloating.  Drink plenty of fluids but you should avoid alcoholic beverages for 24 hours.  ACTIVITY: Your care partner should take you home directly after the procedure.  You should plan to take it easy, moving slowly for the rest of the day.  You can resume normal activity the day after the procedure however you should NOT DRIVE or use heavy machinery for 24 hours (because of the sedation medicines used during the test).    SYMPTOMS TO REPORT IMMEDIATELY: A gastroenterologist can be reached at any hour.  During normal business hours, 8:30 AM to 5:00 PM Monday through Friday,  call [phone].  After hours and on weekends, please call the GI answering service at [phone] who will take a message and have the physician on call contact you.   Following lower endoscopy (colonoscopy or flexible sigmoidoscopy):  Excessive amounts of blood in the stool  Significant tenderness or worsening of abdominal pains  Swelling of the abdomen that is new, acute  Fever of 100F or higher  Following upper endoscopy (EGD)  Vomiting of blood or coffee ground material  New chest pain or pain under the shoulder blades  Painful or persistently difficult swallowing  New shortness of breath  Fever of 100F or higher  Black, tarry-looking stools  FOLLOW UP: If any biopsies were taken you will be contacted by phone or by letter within the next 1-3 weeks.  Call your gastroenterologist if you have not heard about the biopsies in 3 weeks.  Our staff will call the home number listed on your records the next business day following your procedure to check on you and address any questions or concerns that you may have at that time regarding the information given to you following your procedure. This is a courtesy call and so if there is no answer at the home number and we have not heard from you through the emergency physician on call, we will assume that you have returned to your regular daily activities without incident.  SIGNATURES/CONFIDENTIALITY: You and/or your care   partner have signed paperwork which will be entered into your electronic medical record.  These signatures attest to the fact that that the information above on your After Visit Summary has been reviewed and is understood.  Full responsibility of the confidentiality of this discharge information lies with you and/or your care-partner.  

## 2012-05-03 ENCOUNTER — Telehealth: Payer: Self-pay | Admitting: *Deleted

## 2012-05-03 NOTE — Telephone Encounter (Signed)
  Follow up Call-  Call back number 05/02/2012  Post procedure Call Back phone  # [phone]  Permission to leave phone message Yes     Patient questions:  Do you have a fever, pain , or abdominal swelling? no Pain Score  0 *  Have you tolerated food without any problems? yes  Have you been able to return to your normal activities? yes  Do you have any questions about your discharge instructions: Diet   no Medications  no Follow up visit  no  Do you have questions or concerns about your Care? no  Actions: * If pain score is 4 or above: No action needed, pain <4.

## 2012-05-09 ENCOUNTER — Encounter: Payer: Self-pay | Admitting: Gastroenterology

## 2012-05-16 ENCOUNTER — Other Ambulatory Visit: Payer: Self-pay | Admitting: Internal Medicine

## 2012-05-16 MED ORDER — ZOLPIDEM 10 MG TABLET
1.0000 | ORAL_TABLET | Freq: Every day | ORAL | Status: DC
Start: 2012-05-16 — End: 2012-10-30

## 2012-05-16 NOTE — Telephone Encounter (Signed)
rx written. Please fax.

## 2012-05-16 NOTE — Telephone Encounter (Signed)
Faxed and filed Rx.    Genice Kimberlin, LVN

## 2012-05-16 NOTE — Telephone Encounter (Signed)
-  verified name, DOB, MRN and/or address at initiation of call.    Pt requesting refill for above medication. Please send Rx to pharmacy.

## 2012-05-27 ENCOUNTER — Ambulatory Visit (INDEPENDENT_AMBULATORY_CARE_PROVIDER_SITE_OTHER): Payer: Medicare Other | Admitting: Family Medicine

## 2012-05-27 VITALS — BP 130/81 | HR 48 | Temp 98.1°F | Resp 16

## 2012-05-27 DIAGNOSIS — I10 Essential (primary) hypertension: Secondary | ICD-10-CM | POA: Diagnosis not present

## 2012-05-27 MED ORDER — HYDROCHLOROTHIAZIDE 25 MG PO TABS: 25.0000 mg | ORAL_TABLET | Freq: Every day | ORAL | Status: DC

## 2012-05-27 NOTE — Progress Notes (Signed)
[facility]:  Office Visit  Chief Complaint:  Chief Complaint  Patient presents with  . Hypertension    is getting very high readings at home and [facility]; 200 's/80's    HPI: Melissa Murphy is a 76 y.o. female who complains of : Elevated BP. She wants to get her varicose vein operated on but is having high BPs so they will not do it. She is already on Altace 20 mg , Metoprolol 100 mg BID. Still high BP in the 180s-200/80-90s. She is compliant with meds and has No SEs. She is currently not on HCTZ and has not been onit before, she does take Lasix prn but not recently for swelling of LE   Past Medical History  Diagnosis Date  . GERD (gastroesophageal reflux disease)   . Hypertension   . Hyperlipidemia   . Glaucoma   . Urinary incontinence   . Edema   . Anemia   . Arthritis   . DVT (deep venous thrombosis)     in eye, and leg  . Osteoporosis   . Ulcer   . Hernia   . Deaf    Past Surgical History  Procedure Date  . Cataract extraction     both eyes  . Goiter removed     age 76  . Xanthoma tumor removed   . Refractive surgery    History   Social History  . Marital Status: Divorced    Spouse Name: N/A    Number of Children: N/A  . Years of Education: N/A   Social History Main Topics  . Smoking status: Never Smoker   . Smokeless tobacco: Not on file  . Alcohol Use: Yes     occasionally  . Drug Use: No  . Sexually Active: Not on file   Other Topics Concern  . Not on file   Social History Narrative  . No narrative on file   Family History  Problem Relation Age of Onset  . Colon cancer Mother   . Esophageal cancer Neg Hx   . Rectal cancer Neg Hx   . Stomach cancer Neg Hx    Allergies  Allergen Reactions  . Clindamycin/Lincomycin Itching  . Novocain (Procaine)     seizures  . Sulfa Antibiotics     fever   Prior to Admission medications   Medication Sig Start Date End Date Taking? Authorizing Provider  aspirin 81 MG tablet  Take 160 mg by mouth daily.   Yes Historical Provider, MD  dorzolamide-timolol (COSOPT) 22.3-6.8 MG/ML ophthalmic solution Place 1 drop into the right eye 2 (two) times daily.   Yes Historical Provider, MD  metoprolol (LOPRESSOR) 50 MG tablet Take 2 tablets (100 mg total) by mouth 2 (two) times daily. 04/30/12  Yes Phillips Odor, MD  ramipril (ALTACE) 10 MG capsule Take 1 capsule (10 mg total) by mouth 2 (two) times daily. 03/11/12  Yes Thao P Le, DO  simvastatin (ZOCOR) 20 MG tablet Take 20 mg by mouth every evening.   Yes Historical Provider, MD  zolpidem (AMBIEN) 10 MG tablet Take 10 mg by mouth at bedtime as needed.   Yes Historical Provider, MD  ASA-APAP-CAFF-CA GLUC PO Take by mouth.    Historical Provider, MD  Calcium Carbonate (CALTRATE 600 PO) Take by mouth daily.    Historical Provider, MD  Cholecalciferol (VITAMIN D-3 PO) Take by mouth daily.    Historical Provider, MD  dextromethorphan-guaiFENesin (MUCINEX DM) 30-600 MG per 12 hr tablet Take 1  tablet by mouth every 12 (twelve) hours.    Historical Provider, MD  furosemide (LASIX) 20 MG tablet Take 20 mg by mouth as needed.     Historical Provider, MD  IRON PO Take by mouth daily.    Historical Provider, MD  Multiple Vitamins-Minerals (CENTRUM PO) Take by mouth daily.    Historical Provider, MD  Omega-3 Fatty Acids (FISH OIL PO) Take by mouth daily.    Historical Provider, MD  omeprazole (PRILOSEC) 20 MG capsule Take 20 mg by mouth 2 (two) times daily.    Historical Provider, MD  potassium chloride (K-DUR,KLOR-CON) 10 MEQ tablet Take 10 mEq by mouth 2 (two) times daily.    Historical Provider, MD  tolterodine (DETROL) 2 MG tablet Take 2 mg by mouth daily.     Historical Provider, MD     ROS: The patient denies fevers, chills, night sweats, unintentional weight loss, chest pain, palpitations, wheezing, dyspnea on exertion, nausea, vomiting, abdominal pain, dysuria, hematuria, melena, numbness, weakness, or tingling.  All other  systems have been reviewed and were otherwise negative with the exception of those mentioned in the HPI and as above.    PHYSICAL EXAM: Filed Vitals:   05/27/12 1926  BP: 130/81  Pulse:   Temp:   Resp:    There were no vitals filed for this visit. There is no height or weight on file to calculate BMI.  General: Alert, no acute distress HEENT:  Normocephalic, atraumatic, oropharynx patent.  Cardiovascular:  Regular rate and rhythm, no rubs murmurs or gallops.  No Carotid bruits, radial pulse intact. No pedal edema.  Respiratory: Clear to auscultation bilaterally.  No wheezes, rales, or rhonchi.  No cyanosis, no use of accessory musculature GI: No organomegaly, abdomen is soft and non-tender, positive bowel sounds.  No masses. Skin: No rashes. Neurologic: Facial musculature symmetric. Psychiatric: Patient is appropriate throughout our interaction. Lymphatic: No cervical lymphadenopathy Musculoskeletal: Gait intact.   LABS: Results for orders placed in visit on 03/11/12  POCT CBC      Component Value Range   WBC 6.2  4.6 - 10.2 K/uL   Lymph, poc 2.9  0.6 - 3.4   POC LYMPH PERCENT 46.9  10 - 50 %L   MID (cbc) 0.5  0 - 0.9   POC MID % 7.6  0 - 12 %M   POC Granulocyte 2.8  2 - 6.9   Granulocyte percent 45.5  37 - 80 %G   RBC 4.67  4.04 - 5.48 M/uL   Hemoglobin 13.8  12.2 - 16.2 g/dL   HCT, POC 16.1  09.6 - 47.9 %   MCV 96.0  80 - 97 fL   MCH, POC 29.6  27 - 31.2 pg   MCHC 30.8 (*) 31.8 - 35.4 g/dL   RDW, POC 04.5     Platelet Count, POC 212  142 - 424 K/uL   MPV 9.3  0 - 99.8 fL  COMPREHENSIVE METABOLIC PANEL      Component Value Range   Sodium 139  135 - 145 mEq/L   Potassium 4.2  3.5 - 5.3 mEq/L   Chloride 107  96 - 112 mEq/L   CO2 25  19 - 32 mEq/L   Glucose, Bld 82  70 - 99 mg/dL   BUN 16  6 - 23 mg/dL   Creat 4.09  8.11 - 9.14 mg/dL   Total Bilirubin 0.4  0.3 - 1.2 mg/dL   Alkaline Phosphatase 68  39 - 117 U/L  AST 26  0 - 37 U/L   ALT 16  0 - 35 U/L   Total  Protein 6.7  6.0 - 8.3 g/dL   Albumin 4.1  3.5 - 5.2 g/dL   Calcium 9.7  8.4 - 16.1 mg/dL  POCT UA - MICROSCOPIC ONLY      Component Value Range   WBC, Ur, HPF, POC neg     RBC, urine, microscopic neg     Bacteria, U Microscopic neg     Mucus, UA neg     Epithelial cells, urine per micros 1-2     Crystals, Ur, HPF, POC neg     Casts, Ur, LPF, POC neg     Yeast, UA neg    POCT URINALYSIS DIPSTICK      Component Value Range   Color, UA yellow     Clarity, UA clear     Glucose, UA neg     Bilirubin, UA neg     Ketones, UA neg     Spec Grav, UA <=1.005     Blood, UA neg     pH, UA 5.0     Protein, UA neg     Urobilinogen, UA 0.2     Nitrite, UA neg     Leukocytes, UA Negative       EKG/XRAY:   Primary read interpreted by Dr. Conley Rolls at [facility].   ASSESSMENT/PLAN: Encounter Diagnosis  Name Primary?  . HTN (hypertension) Yes   Patient is a pleasant deaf woman who always comes with her deaf friend. She is very compliant with her medications.  Will refill meds Altace and Metoprolol Will start her on HCTZ 25 mg daily Return in 1 week to get BP evaluated with either Drs Clelia Croft or Audria Nine. Advise to bring BP logs    LE, THAO PHUONG, DO 05/27/2012 8:20 PM

## 2012-05-28 ENCOUNTER — Telehealth: Payer: Self-pay | Admitting: Radiology

## 2012-05-28 MED ORDER — CLONIDINE HCL 0.1 MG PO TABS: 0.1000 mg | ORAL_TABLET | Freq: Two times a day (BID) | ORAL | Status: DC

## 2012-05-28 NOTE — Telephone Encounter (Signed)
Pharmacy called, patient does not want to take the HCTZ, order changed to Clonidine bid, patient to be advised by pharmacy, this may cause dizziness

## 2012-06-05 ENCOUNTER — Ambulatory Visit (INDEPENDENT_AMBULATORY_CARE_PROVIDER_SITE_OTHER): Payer: Medicare Other | Admitting: Ophthalmology

## 2012-06-05 DIAGNOSIS — H35039 Hypertensive retinopathy, unspecified eye: Secondary | ICD-10-CM

## 2012-06-05 DIAGNOSIS — I10 Essential (primary) hypertension: Secondary | ICD-10-CM

## 2012-06-05 DIAGNOSIS — H43819 Vitreous degeneration, unspecified eye: Secondary | ICD-10-CM

## 2012-06-18 ENCOUNTER — Ambulatory Visit (INDEPENDENT_AMBULATORY_CARE_PROVIDER_SITE_OTHER): Payer: Medicare Other | Admitting: Family Medicine

## 2012-06-18 VITALS — BP 112/68 | HR 49 | Temp 98.1°F | Resp 16 | Ht 64.0 in | Wt 216.0 lb

## 2012-06-18 DIAGNOSIS — I10 Essential (primary) hypertension: Secondary | ICD-10-CM

## 2012-06-18 NOTE — Progress Notes (Signed)
Patient ID: Melissa Murphy, female   DOB: [birth year], 76 y.o.   MRN: [id number] Melissa Murphy is a 76 y.o. female who presents to Urgent Care today for hypertension:   1.   Hypertension:  Long-term problem for this patient.  She is scheduled for varicose vein surgery later this month, but wanted to ensure that her blood pressure is within normal range.  She is taking several anti-hypertensive medications and she is tolerating these well.  Most recent BPs have been WNL here at [facility].  Checks it at [facility] where it is consistently high, but will leave there and drive to another pharmacy where it will be normal.  Also normal at other pharmacies and clinics.  No HA, CP, dizziness, shortness of breath, palpitations, or LE swelling.   BP Readings from Last 3 Encounters:  06/18/12 112/68  05/27/12 130/81  05/02/12 186/104     PMH reviewed.  ROS as above otherwise neg.    Medications reviewed. Current Outpatient Prescriptions  Medication Sig Dispense Refill  . aspirin 81 MG tablet Take 160 mg by mouth daily.      . cloNIDine (CATAPRES) 0.1 MG tablet Take 1 tablet (0.1 mg total) by mouth 2 (two) times daily.  60 tablet  3  . dorzolamide-timolol (COSOPT) 22.3-6.8 MG/ML ophthalmic solution Place 1 drop into the right eye 2 (two) times daily.      . metoprolol (LOPRESSOR) 50 MG tablet Take 2 tablets (100 mg total) by mouth 2 (two) times daily.  60 tablet  2  . omeprazole (PRILOSEC) 20 MG capsule Take 20 mg by mouth as needed.       . ramipril (ALTACE) 10 MG capsule Take 1 capsule (10 mg total) by mouth 2 (two) times daily.  60 capsule  5  . simvastatin (ZOCOR) 20 MG tablet Take 20 mg by mouth every evening.      . tolterodine (DETROL) 2 MG tablet Take 2 mg by mouth daily.       Marland Kitchen zolpidem (AMBIEN) 10 MG tablet Take 10 mg by mouth at bedtime as needed.      . ASA-APAP-CAFF-CA GLUC PO Take by mouth.      . Calcium Carbonate (CALTRATE 600 PO) Take by mouth daily.      . Cholecalciferol  (VITAMIN D-3 PO) Take by mouth daily.      . furosemide (LASIX) 20 MG tablet Take 20 mg by mouth as needed.       . IRON PO Take by mouth daily.      . Multiple Vitamins-Minerals (CENTRUM PO) Take by mouth daily.      . Omega-3 Fatty Acids (FISH OIL PO) Take by mouth daily.      . potassium chloride (K-DUR,KLOR-CON) 10 MEQ tablet Take 10 mEq by mouth 2 (two) times daily.        Exam:  BP 112/68  Pulse 49  Temp 98.1 F (36.7 C)  Resp 16  Ht 5\' 4"  (1.626 m)  Wt 216 lb (97.977 kg)  BMI 37.08 kg/m2 Gen: Well NAD HEENT: EOMI,  MMM Pulm:  Clear to auscultation bilaterally with good air movement.  No wheezes or rales noted.   Cardiac:  Regular rate and rhythm without murmur auscultated.  Good S1/S2. Abd: NABS, NT, ND Exts: Non edematous BL  LE, warm and well perfused.   Assessment and Plan:  1.  HTN:  At goal today.  Wrote her a letter which detailed her most recent blood pressures.  Recommended she  not check her blood pressure at [facility] due to elevated BP's there, machine may be off.  Does have documented elevated pressures here previously, but currently at goal.

## 2012-06-20 NOTE — Progress Notes (Signed)
I have read, reviewed, and agree with plan of care by Dr. Walden  

## 2012-06-22 ENCOUNTER — Other Ambulatory Visit: Payer: Self-pay | Admitting: Emergency Medicine

## 2012-07-05 ENCOUNTER — Encounter (HOSPITAL_COMMUNITY): Payer: Self-pay | Admitting: Emergency Medicine

## 2012-07-05 ENCOUNTER — Emergency Department (HOSPITAL_COMMUNITY): Payer: Medicare Other

## 2012-07-05 ENCOUNTER — Emergency Department (HOSPITAL_COMMUNITY)
Admission: EM | Admit: 2012-07-05 | Discharge: 2012-07-05 | Disposition: A | Discharge status: Home / Self Care | Payer: Medicare Other | Attending: Emergency Medicine | Admitting: Emergency Medicine

## 2012-07-05 ENCOUNTER — Ambulatory Visit: Payer: Medicare Other | Admitting: Family Medicine

## 2012-07-05 DIAGNOSIS — R109 Unspecified abdominal pain: Secondary | ICD-10-CM | POA: Diagnosis not present

## 2012-07-05 DIAGNOSIS — Z8719 Personal history of other diseases of the digestive system: Secondary | ICD-10-CM | POA: Insufficient documentation

## 2012-07-05 DIAGNOSIS — Z8669 Personal history of other diseases of the nervous system and sense organs: Secondary | ICD-10-CM | POA: Diagnosis not present

## 2012-07-05 DIAGNOSIS — I10 Essential (primary) hypertension: Secondary | ICD-10-CM

## 2012-07-05 DIAGNOSIS — Z7982 Long term (current) use of aspirin: Secondary | ICD-10-CM | POA: Diagnosis not present

## 2012-07-05 DIAGNOSIS — H913 Deaf nonspeaking, not elsewhere classified: Secondary | ICD-10-CM | POA: Diagnosis not present

## 2012-07-05 DIAGNOSIS — D649 Anemia, unspecified: Secondary | ICD-10-CM | POA: Insufficient documentation

## 2012-07-05 DIAGNOSIS — I82409 Acute embolism and thrombosis of unspecified deep veins of unspecified lower extremity: Secondary | ICD-10-CM | POA: Insufficient documentation

## 2012-07-05 DIAGNOSIS — M810 Age-related osteoporosis without current pathological fracture: Secondary | ICD-10-CM | POA: Diagnosis not present

## 2012-07-05 DIAGNOSIS — E785 Hyperlipidemia, unspecified: Secondary | ICD-10-CM | POA: Diagnosis not present

## 2012-07-05 DIAGNOSIS — Z79899 Other long term (current) drug therapy: Secondary | ICD-10-CM | POA: Insufficient documentation

## 2012-07-05 DIAGNOSIS — R1031 Right lower quadrant pain: Secondary | ICD-10-CM | POA: Diagnosis not present

## 2012-07-05 DIAGNOSIS — R197 Diarrhea, unspecified: Secondary | ICD-10-CM | POA: Insufficient documentation

## 2012-07-05 DIAGNOSIS — Z872 Personal history of diseases of the skin and subcutaneous tissue: Secondary | ICD-10-CM | POA: Diagnosis not present

## 2012-07-05 DIAGNOSIS — M129 Arthropathy, unspecified: Secondary | ICD-10-CM | POA: Diagnosis not present

## 2012-07-05 DIAGNOSIS — K219 Gastro-esophageal reflux disease without esophagitis: Secondary | ICD-10-CM | POA: Insufficient documentation

## 2012-07-05 DIAGNOSIS — R1032 Left lower quadrant pain: Secondary | ICD-10-CM | POA: Diagnosis not present

## 2012-07-05 LAB — OCCULT BLOOD, POC DEVICE: Fecal Occult Bld: NEGATIVE

## 2012-07-05 LAB — COMPREHENSIVE METABOLIC PANEL
ALT: 16 U/L (ref 0–35)
AST: 26 U/L (ref 0–37)
Alkaline Phosphatase: 81 U/L (ref 39–117)
CO2: 26 mEq/L (ref 19–32)
Calcium: 9.4 mg/dL (ref 8.4–10.5)
Chloride: 102 mEq/L (ref 96–112)
GFR calc Af Amer: >90 mL/min (ref >90)
GFR calc non Af Amer: 83 mL/min — ABNORMAL LOW (ref >90)
Glucose, Bld: 99 mg/dL (ref 70–99)
Potassium: 3.8 mEq/L (ref 3.5–5.1)
Sodium: 137 mEq/L (ref 135–145)

## 2012-07-05 LAB — URINE MICROSCOPIC-ADD ON

## 2012-07-05 LAB — URINALYSIS, ROUTINE W REFLEX MICROSCOPIC
Bilirubin Urine: NEGATIVE
Glucose, UA: NEGATIVE mg/dL
Ketones, ur: NEGATIVE mg/dL
Nitrite: NEGATIVE
Protein, ur: NEGATIVE mg/dL

## 2012-07-05 LAB — SAMPLE TO BLOOD BANK

## 2012-07-05 LAB — CBC WITH DIFFERENTIAL/PLATELET
Basophils Absolute: 0 10*3/uL (ref 0.0–0.1)
Lymphocytes Relative: 49 % — ABNORMAL HIGH (ref 12–46)
Lymphs Abs: 2.2 10*3/uL (ref 0.7–4.0)
MCV: 90.6 fL (ref 78.0–100.0)
Neutro Abs: 1.8 10*3/uL (ref 1.7–7.7)
Neutrophils Relative %: 39 % — ABNORMAL LOW (ref 43–77)
Platelets: 175 10*3/uL (ref 150–400)
RBC: 4.38 MIL/uL (ref 3.87–5.11)
RDW: 13 % (ref 11.5–15.5)
WBC: 4.5 10*3/uL (ref 4.0–10.5)

## 2012-07-05 MED ORDER — DICYCLOMINE HCL 20 MG PO TABS: 20.0000 mg | ORAL_TABLET | Freq: Four times a day (QID) | ORAL | Status: DC | PRN

## 2012-07-05 MED ORDER — IOHEXOL 300 MG/ML  SOLN
100.0000 mL | Freq: Once | INTRAMUSCULAR | Status: AC | PRN
  Administered 2012-07-05: 100 mL via INTRAVENOUS

## 2012-07-05 MED ORDER — CLONIDINE HCL 0.1 MG PO TABS
0.1000 mg | ORAL_TABLET | Freq: Once | ORAL | Status: AC
  Administered 2012-07-05: 0.1 mg via ORAL
  Filled 2012-07-05: qty 1

## 2012-07-05 NOTE — ED Provider Notes (Signed)
History     CSN: [id number]  Arrival date & time 07/05/12  8657   First MD Initiated Contact with Patient 07/05/12 0319      Chief Complaint  Patient presents with  . Diarrhea    (Consider location/radiation/quality/duration/timing/severity/associated sxs/prior treatment) HPI 76 year old female presents to the emergency apartment with complaint of lower abdominal pain starting in her right and radiating over to her left, diarrhea with black stool. She denies any fever or chills no nausea or vomiting no chest pain no shortness of breath. She has not been using Pepto-Bismol for the diarrhea.     Past Medical History  Diagnosis Date  . GERD (gastroesophageal reflux disease)   . Hypertension   . Hyperlipidemia   . Glaucoma   . Urinary incontinence   . Edema   . Anemia   . Arthritis   . DVT (deep venous thrombosis)     in eye, and leg  . Osteoporosis   . Ulcer   . Hernia   . Deaf     Past Surgical History  Procedure Date  . Cataract extraction     both eyes  . Goiter removed     age 25  . Xanthoma tumor removed   . Refractive surgery     Family History  Problem Relation Age of Onset  . Esophageal cancer Neg Hx   . Rectal cancer Neg Hx   . Stomach cancer Neg Hx     History  Substance Use Topics  . Smoking status: Never Smoker   . Smokeless tobacco: Not on file  . Alcohol Use: Yes     Comment: occasionally    OB History    Grav Para Term Preterm Abortions TAB SAB Ect Mult Living                  Review of Systems  Unable to perform ROS: Other  patient is deaf  Allergies  Clindamycin/lincomycin; Novocain; and Sulfa antibiotics  Home Medications   Current Outpatient Rx  Name  Route  Sig  Dispense  Refill  . ACETAMINOPHEN 500 MG PO CAPS   Oral   Take 500 mg by mouth every 6 (six) hours as needed. For pain         . ASPIRIN 81 MG PO TABS   Oral   Take 81 mg by mouth daily.          Marland Kitchen CARBOXYMETH-GLYCERIN-POLYSORB 0.5-1-0.5 % OP SOLN  Both Eyes   Place 1 drop into both eyes daily as needed. For eye lubricant         . CLONIDINE HCL 0.1 MG PO TABS   Oral   Take 1 tablet (0.1 mg total) by mouth 2 (two) times daily.   60 tablet   3   . DORZOLAMIDE HCL-TIMOLOL MAL 22.3-6.8 MG/ML OP SOLN   Right Eye   Place 1 drop into the right eye 2 (two) times daily.         Marland Kitchen FERROUS SULFATE CR 160 (50 FE) MG PO TBCR   Oral   Take 160 mg by mouth daily.         . FUROSEMIDE 20 MG PO TABS   Oral   Take 20 mg by mouth every morning.          Marland Kitchen METOPROLOL TARTRATE 50 MG PO TABS   Oral   Take 100 mg by mouth 2 (two) times daily.         Marland Kitchen OMEPRAZOLE 20 MG  PO CPDR   Oral   Take 20 mg by mouth 2 (two) times daily.          Marland Kitchen POTASSIUM CHLORIDE CRYS ER 10 MEQ PO TBCR   Oral   Take 10 mEq by mouth daily.          Marland Kitchen RAMIPRIL 10 MG PO CAPS   Oral   Take 1 capsule (10 mg total) by mouth 2 (two) times daily.   60 capsule   5   . SIMVASTATIN 20 MG PO TABS   Oral   Take 20 mg by mouth every evening.         Marland Kitchen SALINE 0.65 % NA SOLN   Nasal   Place 1-2 sprays into the nose daily as needed. For nasal drainage         . TOLTERODINE TARTRATE 2 MG PO TABS   Oral   Take 2 mg by mouth 2 (two) times daily.          Marland Kitchen ZOLPIDEM TARTRATE 10 MG PO TABS   Oral   Take 10 mg by mouth at bedtime as needed. For sleep           BP 258/99  Pulse 56  Temp 98.4 F (36.9 C) (Oral)  Resp 18  SpO2 99%  Physical Exam  Constitutional: She is oriented to person, place, and time. She appears well-developed and well-nourished. No distress.  HENT:  Head: Normocephalic and atraumatic.  Nose: Nose normal.  Mouth/Throat: Oropharynx is clear and moist. No oropharyngeal exudate.  Neck: Normal range of motion. Neck supple. No JVD present. No tracheal deviation present. No thyromegaly present.  Cardiovascular: Normal rate, regular rhythm, normal heart sounds and intact distal pulses.  Exam reveals no gallop and no friction  rub.   No murmur heard. Pulmonary/Chest: Effort normal and breath sounds normal. No stridor. No respiratory distress. She has no wheezes. She has no rales. She exhibits no tenderness.  Abdominal: Soft. She exhibits no distension and no mass. There is tenderness. There is no rebound and no guarding.       Decreased bowel sounds, pain RLQ, suprapubic, LLQ  Musculoskeletal: Normal range of motion. She exhibits edema. She exhibits no tenderness.  Lymphadenopathy:    She has no cervical adenopathy.  Neurological: She is alert and oriented to person, place, and time.  Skin: Skin is warm and dry. No rash noted. She is not diaphoretic. No erythema. No pallor.    ED Course  Procedures (including critical care time)  Labs Reviewed  URINALYSIS, ROUTINE W REFLEX MICROSCOPIC - Abnormal; Notable for the following:    Hgb urine dipstick MODERATE (*)     Leukocytes, UA TRACE (*)     All other components within normal limits  CBC WITH DIFFERENTIAL - Abnormal; Notable for the following:    Neutrophils Relative 39 (*)     Lymphocytes Relative 49 (*)     All other components within normal limits  COMPREHENSIVE METABOLIC PANEL - Abnormal; Notable for the following:    Total Bilirubin 0.2 (*)     GFR calc non Af Amer 83 (*)     All other components within normal limits  URINE MICROSCOPIC-ADD ON - Abnormal; Notable for the following:    Squamous Epithelial / LPF MANY (*)     All other components within normal limits  LIPASE, BLOOD  SAMPLE TO BLOOD BANK  PROTIME-INR  OCCULT BLOOD, POC DEVICE   Ct Abdomen Pelvis W Contrast  07/05/2012  *  RADIOLOGY REPORT*  Clinical Data: Abdominal pain  CT ABDOMEN AND PELVIS WITH CONTRAST  Technique:  Multidetector CT imaging of the abdomen and pelvis was performed following the standard protocol during bolus administration of intravenous contrast.  Contrast: OMNIPAQUE IOHEXOL 300 MG/ML  SOLN  Comparison: None.  Findings: Intrathoracic stomach with organoaxial  rotation.  Mild bibasilar scarring and/or atelectasis.  Heart size within normal limits.  No pleural or pericardial effusion.  Unremarkable liver, spleen, pancreas, biliary system, adrenal glands.  Bilateral too small to further characterize renal hypodensities. No hydronephrosis or hydroureter.  No bowel dilatation and contrast has reached the colon, therefore no evidence for obstruction.  There is mild colonic diverticulosis without CT evidence for diverticulitis; normal appendix.  No free intraperitoneal air or fluid.  No lymphadenopathy.  Lobular uterine contour may reflect underlying fibroids.  No adnexal mass.  Thin-walled bladder.  Fat containing inguinal hernias.  Osteopenia and multilevel degenerative changes.  Grade 1 retrolisthesis of L2 on L3 and L3-4.  IMPRESSION: Intrathoracic stomach with organoaxial rotation.  No evidence for obstruction, with contrast reaching the colon.  Otherwise, no acute abdominopelvic process identified.  Colonic diverticulosis without CT evidence for diverticulitis.  Bilateral incompletely characterized renal hypodensities.  No hydronephrosis.   Original Report Authenticated By: Jearld Lesch, M.D.      1. Abdominal pain, acute   2. Diarrhea   3. Hypertension       MDM  76 year old female with lower jaw pain diarrhea reported dark stools. Patient is a difficult historian as she is deaf. No fever. Patient is hypertensive here denies missing any doses of her blood pressure medicines. We'll check full labs, CT abdomen pelvis, check for possible GI bleeding.       Olivia Mackie, MD 07/05/12 [phone]

## 2012-07-05 NOTE — ED Notes (Signed)
Pt c/o RLQ abd pain, radiates to mid abd

## 2012-07-05 NOTE — ED Notes (Signed)
Patient transported to CT 

## 2012-07-05 NOTE — ED Notes (Signed)
Patient returned from CT

## 2012-07-05 NOTE — ED Notes (Signed)
Pt alert, arrives from home, c/o diarrhea, onset was today, pt hearing impaired, resp even, mild labored, skin pwd

## 2012-07-17 ENCOUNTER — Ambulatory Visit (INDEPENDENT_AMBULATORY_CARE_PROVIDER_SITE_OTHER): Payer: Medicare Other | Admitting: Emergency Medicine

## 2012-07-17 ENCOUNTER — Ambulatory Visit: Payer: Medicare Other

## 2012-07-17 VITALS — BP 138/82 | HR 48 | Temp 98.1°F | Resp 18 | Ht 64.0 in | Wt 213.0 lb

## 2012-07-17 DIAGNOSIS — I498 Other specified cardiac arrhythmias: Secondary | ICD-10-CM | POA: Diagnosis not present

## 2012-07-17 DIAGNOSIS — K648 Other hemorrhoids: Secondary | ICD-10-CM

## 2012-07-17 DIAGNOSIS — I10 Essential (primary) hypertension: Secondary | ICD-10-CM | POA: Diagnosis not present

## 2012-07-17 DIAGNOSIS — R001 Bradycardia, unspecified: Secondary | ICD-10-CM

## 2012-07-17 DIAGNOSIS — Z2089 Contact with and (suspected) exposure to other communicable diseases: Secondary | ICD-10-CM | POA: Diagnosis not present

## 2012-07-17 MED ORDER — PERMETHRIN 5 % EX CREA: TOPICAL_CREAM | CUTANEOUS | Status: DC

## 2012-07-17 NOTE — Progress Notes (Signed)
[facility] [address] [place] [address] [phone]  Date:  07/17/2012   Name:  Melissa Murphy   DOB:  March 01, 1936   MRN:  [id number]  PCP:  Tonye Pearson, MD    Chief Complaint: Abdominal Pain, Hypertension and Vaginal Bleeding   History of Present Illness:  Melissa Murphy is a 76 y.o. very pleasant female patient who presents with the following:  Multiple concerns expressed.  She noticed blood on her tissue today and is uncertain as to the origin of the blood, thinking it is related to her GYN tract. Denies symptoms.  Has GERD and an intrathoracic stomach and has abdominal pain.  No nausea or vomiting.  She was noted to have hemorrhoids on her scan.    Is concerned about the variability of her blood pressure as it apparently reads high at the drug store, it looks like that matter had been addressed previously.  She has no symptoms associated with her higher readings nor her bradycardia  Patient Active Problem List  Diagnosis  . Edema  . Leg pain, bilateral  . Stool color abnormal  . Anemia  . Hypertension    Past Medical History  Diagnosis Date  . GERD (gastroesophageal reflux disease)   . Hypertension   . Hyperlipidemia   . Glaucoma   . Urinary incontinence   . Edema   . Anemia   . Arthritis   . DVT (deep venous thrombosis)     in eye, and leg  . Osteoporosis   . Ulcer   . Hernia   . Deaf     Past Surgical History  Procedure Date  . Cataract extraction     both eyes  . Goiter removed     age 32  . Xanthoma tumor removed   . Refractive surgery     History  Substance Use Topics  . Smoking status: Never Smoker   . Smokeless tobacco: Not on file  . Alcohol Use: Yes     Comment: occasionally    Family History  Problem Relation Age of Onset  . Esophageal cancer Neg Hx   . Rectal cancer Neg Hx   . Stomach cancer Neg Hx     Allergies  Allergen Reactions  . Clindamycin/Lincomycin Itching  .  Novocain (Procaine)     seizures  . Sulfa Antibiotics     fever    Medication list has been reviewed and updated.  Current Outpatient Prescriptions on File Prior to Visit  Medication Sig Dispense Refill  . Acetaminophen 500 MG coapsule Take 500 mg by mouth every 6 (six) hours as needed. For pain      . aspirin 81 MG tablet Take 81 mg by mouth daily.       . Carboxymeth-Glycerin-Polysorb 0.5-1-0.5 % SOLN Place 1 drop into both eyes daily as needed. For eye lubricant      . cloNIDine (CATAPRES) 0.1 MG tablet Take 1 tablet (0.1 mg total) by mouth 2 (two) times daily.  60 tablet  3  . dicyclomine (BENTYL) 20 MG tablet Take 1 tablet (20 mg total) by mouth every 6 (six) hours as needed (abdominal cramps).  20 tablet  0  . dorzolamide-timolol (COSOPT) 22.3-6.8 MG/ML ophthalmic solution Place 1 drop into the right eye 2 (two) times daily.      . ferrous sulfate dried (SLOW FE) 160 (50 FE) MG TBCR Take 160 mg by mouth daily.      . furosemide (LASIX)  20 MG tablet Take 20 mg by mouth every morning.       . metoprolol (LOPRESSOR) 50 MG tablet Take 100 mg by mouth 2 (two) times daily.      Marland Kitchen omeprazole (PRILOSEC) 20 MG capsule Take 20 mg by mouth 2 (two) times daily.       . potassium chloride (K-DUR,KLOR-CON) 10 MEQ tablet Take 10 mEq by mouth daily.       . ramipril (ALTACE) 10 MG capsule Take 1 capsule (10 mg total) by mouth 2 (two) times daily.  60 capsule  5  . simvastatin (ZOCOR) 20 MG tablet Take 20 mg by mouth every evening.      . sodium chloride (OCEAN) 0.65 % SOLN nasal spray Place 1-2 sprays into the nose daily as needed. For nasal drainage      . tolterodine (DETROL) 2 MG tablet Take 2 mg by mouth 2 (two) times daily.       Marland Kitchen zolpidem (AMBIEN) 10 MG tablet Take 10 mg by mouth at bedtime as needed. For sleep        Review of Systems:  As per HPI, otherwise negative.    Physical Examination: Filed Vitals:   07/17/12 1913  BP: 138/82  Pulse: 48  Temp: 98.1 F (36.7 C)  Resp: 18    Filed Vitals:   07/17/12 1913  Height: 5\' 4"  (1.626 m)  Weight: 213 lb (96.616 kg)   Body mass index is 36.56 kg/(m^2). Ideal Body Weight: Weight in (lb) to have BMI = 25: 145.3   GEN: WDWN, NAD, Non-toxic, A & O x 3 HEENT: Atraumatic, Normocephalic. Neck supple. No masses, No LAD. Ears and Nose: No external deformity. CV: RRR, No M/G/R. No JVD. No thrill. No extra heart sounds. PULM: CTA B, no wheezes, crackles, rhonchi. No retractions. No resp. distress. No accessory muscle use. ABD: S, NT, ND, +BS. No rebound. No HSM. EXTR: No c/c/e NEURO Normal gait.  PSYCH: Normally interactive. Conversant. Not depressed or anxious appearing.  Calm demeanor.    Assessment and Plan: Deaf Abdominal pain 2nd intrathoracic stomach GI bleeding. Requests GYN and GI referral. Cardiology referral re:  Hypertension and bradycardia  Carmelina Dane, MD

## 2012-07-19 ENCOUNTER — Telehealth: Payer: Self-pay

## 2012-07-19 NOTE — Telephone Encounter (Signed)
Last office notes faxed thru Epic to Dr. Huel Coventry office at [phone].

## 2012-07-19 NOTE — Telephone Encounter (Signed)
JULIE FROM DR [facility] OFFICE STATES PT IS COMING TO SEE THEM ON Monday AND THEY ARE IN NEED OF RECORDS PLEASE FAX TO [phone] AND THE PHONE NUMBER IS [phone]

## 2012-07-20 DIAGNOSIS — Z79899 Other long term (current) drug therapy: Secondary | ICD-10-CM | POA: Diagnosis not present

## 2012-07-20 DIAGNOSIS — Z87898 Personal history of other specified conditions: Secondary | ICD-10-CM | POA: Insufficient documentation

## 2012-07-20 DIAGNOSIS — Z86718 Personal history of other venous thrombosis and embolism: Secondary | ICD-10-CM | POA: Insufficient documentation

## 2012-07-20 DIAGNOSIS — E785 Hyperlipidemia, unspecified: Secondary | ICD-10-CM | POA: Insufficient documentation

## 2012-07-20 DIAGNOSIS — Z8711 Personal history of peptic ulcer disease: Secondary | ICD-10-CM | POA: Insufficient documentation

## 2012-07-20 DIAGNOSIS — Z7982 Long term (current) use of aspirin: Secondary | ICD-10-CM | POA: Diagnosis not present

## 2012-07-20 DIAGNOSIS — Z8719 Personal history of other diseases of the digestive system: Secondary | ICD-10-CM | POA: Insufficient documentation

## 2012-07-20 DIAGNOSIS — R209 Unspecified disturbances of skin sensation: Secondary | ICD-10-CM | POA: Diagnosis not present

## 2012-07-20 DIAGNOSIS — Z8739 Personal history of other diseases of the musculoskeletal system and connective tissue: Secondary | ICD-10-CM | POA: Diagnosis not present

## 2012-07-20 DIAGNOSIS — Z8679 Personal history of other diseases of the circulatory system: Secondary | ICD-10-CM | POA: Diagnosis not present

## 2012-07-20 DIAGNOSIS — Z87448 Personal history of other diseases of urinary system: Secondary | ICD-10-CM | POA: Diagnosis not present

## 2012-07-20 DIAGNOSIS — I10 Essential (primary) hypertension: Secondary | ICD-10-CM | POA: Diagnosis not present

## 2012-07-20 DIAGNOSIS — R52 Pain, unspecified: Secondary | ICD-10-CM | POA: Diagnosis not present

## 2012-07-20 DIAGNOSIS — Z862 Personal history of diseases of the blood and blood-forming organs and certain disorders involving the immune mechanism: Secondary | ICD-10-CM | POA: Insufficient documentation

## 2012-07-20 DIAGNOSIS — M25539 Pain in unspecified wrist: Secondary | ICD-10-CM | POA: Insufficient documentation

## 2012-07-20 DIAGNOSIS — H9190 Unspecified hearing loss, unspecified ear: Secondary | ICD-10-CM | POA: Diagnosis not present

## 2012-07-20 NOTE — ED Notes (Signed)
Pt is deaf. Interpreter indicates that she was sitting at the computer and her right hand went numb. Seen at [facility] about 2 weeks ago. BP fluctuating. HR low. Referred to cardiologist. Took 2 ASA tonight. No neuro deficits noted, but having trouble signing. Cardiologist appt Dec 20.

## 2012-07-21 ENCOUNTER — Other Ambulatory Visit: Payer: Self-pay | Admitting: Internal Medicine

## 2012-07-21 ENCOUNTER — Encounter (HOSPITAL_BASED_OUTPATIENT_CLINIC_OR_DEPARTMENT_OTHER): Payer: Self-pay | Admitting: *Deleted

## 2012-07-21 ENCOUNTER — Emergency Department (HOSPITAL_BASED_OUTPATIENT_CLINIC_OR_DEPARTMENT_OTHER)
Admission: EM | Admit: 2012-07-21 | Discharge: 2012-07-21 | Disposition: A | Discharge status: Home / Self Care | Payer: Medicare Other | Attending: Emergency Medicine | Admitting: Emergency Medicine

## 2012-07-21 DIAGNOSIS — R200 Anesthesia of skin: Secondary | ICD-10-CM

## 2012-07-21 DIAGNOSIS — M25539 Pain in unspecified wrist: Secondary | ICD-10-CM

## 2012-07-21 MED ORDER — NAPROXEN 250 MG PO TABS
250.0000 mg | ORAL_TABLET | Freq: Two times a day (BID) | ORAL | Status: DC
  Administered 2012-07-21: 250 mg via ORAL

## 2012-07-21 MED ORDER — NAPROXEN 250 MG PO TABS: 250.0000 mg | ORAL_TABLET | Freq: Two times a day (BID) | ORAL | Status: DC

## 2012-07-21 MED ORDER — HYDROCODONE-ACETAMINOPHEN 5-325 MG PO TABS: 1.0000 | ORAL_TABLET | ORAL | Status: DC | PRN

## 2012-07-21 MED ORDER — NAPROXEN 250 MG PO TABS
ORAL_TABLET | ORAL | Status: AC
  Administered 2012-07-21: 250 mg via ORAL
  Filled 2012-07-21: qty 1

## 2012-07-21 NOTE — ED Notes (Signed)
MD at bedside. 

## 2012-07-21 NOTE — ED Provider Notes (Signed)
History   This chart was scribed for Olivia Mackie, MD by Leone Payor, ED Scribe. This patient was seen in room MH01/MH01 and the patient's care was started at 2337.   CSN: [id number]  Arrival date & time 07/20/12  2337   None     Chief Complaint  Patient presents with  . Numbness     The history is provided by the patient and a relative. The history is limited by a language barrier. A language interpreter was used.    Melissa Murphy is a 76 y.o. female who presents to the Emergency Department complaining of constant weakness and numbness in the right hand with sudden onset. Symptoms have been constant since onset.  No weakness elsewhere.  Numbness is described as pins and needles sensation.  Pt reports that she was sitting at the computer and her right hand went numb. Pt states that every night she takes 1 baby aspirin but 1 night ago she took 1 regular aspirin because of loss in sensation in her right hand. No facial droop, no paresthesias elsewhere. Pt denies any headache. Sxs start at wrist and are mainly in thumb and first three fingers.  Daughter reports patient spends a good amount of time on the computer.  Pt c/o throbbing burning type pain in hand, wrist  Pt denies smoking and is an occasional alcohol user.  Past Medical History  Diagnosis Date  . GERD (gastroesophageal reflux disease)   . Hypertension   . Hyperlipidemia   . Glaucoma   . Urinary incontinence   . Edema   . Anemia   . Arthritis   . DVT (deep venous thrombosis)     in eye, and leg  . Osteoporosis   . Ulcer   . Hernia   . Deaf     Past Surgical History  Procedure Date  . Cataract extraction     both eyes  . Goiter removed     age 42  . Xanthoma tumor removed   . Refractive surgery     Family History  Problem Relation Age of Onset  . Esophageal cancer Neg Hx   . Rectal cancer Neg Hx   . Stomach cancer Neg Hx     History  Substance Use Topics  . Smoking status: Never Smoker   .  Smokeless tobacco: Not on file  . Alcohol Use: Yes     Comment: occasionally    OB History    Grav Para Term Preterm Abortions TAB SAB Ect Mult Living                  Review of Systems  Unable to perform ROS: Other  Neurological: Positive for weakness (in the right hand) and numbness (in right hand).  language barrier  Allergies  Clindamycin/lincomycin; Novocain; and Sulfa antibiotics  Home Medications   Current Outpatient Rx  Name  Route  Sig  Dispense  Refill  . ACETAMINOPHEN 500 MG PO CAPS   Oral   Take 500 mg by mouth every 6 (six) hours as needed. For pain         . ASPIRIN 81 MG PO TABS   Oral   Take 81 mg by mouth daily.          Marland Kitchen CARBOXYMETH-GLYCERIN-POLYSORB 0.5-1-0.5 % OP SOLN   Both Eyes   Place 1 drop into both eyes daily as needed. For eye lubricant         . CLONIDINE HCL 0.1 MG PO TABS  Oral   Take 1 tablet (0.1 mg total) by mouth 2 (two) times daily.   60 tablet   3   . DICYCLOMINE HCL 20 MG PO TABS   Oral   Take 1 tablet (20 mg total) by mouth every 6 (six) hours as needed (abdominal cramps).   20 tablet   0   . DORZOLAMIDE HCL-TIMOLOL MAL 22.3-6.8 MG/ML OP SOLN   Right Eye   Place 1 drop into the right eye 2 (two) times daily.         Marland Kitchen FERROUS SULFATE CR 160 (50 FE) MG PO TBCR   Oral   Take 160 mg by mouth daily.         . FUROSEMIDE 20 MG PO TABS   Oral   Take 20 mg by mouth every morning.          Marland Kitchen METOPROLOL TARTRATE 50 MG PO TABS   Oral   Take 100 mg by mouth 2 (two) times daily.         Marland Kitchen OMEPRAZOLE 20 MG PO CPDR   Oral   Take 20 mg by mouth 2 (two) times daily.          Marland Kitchen PERMETHRIN 5 % EX CREA      Apply from chin to toes and leave on overnight and then shower.   60 g   0   . POTASSIUM CHLORIDE CRYS ER 10 MEQ PO TBCR   Oral   Take 10 mEq by mouth daily.          Marland Kitchen RAMIPRIL 10 MG PO CAPS   Oral   Take 1 capsule (10 mg total) by mouth 2 (two) times daily.   60 capsule   5   . SIMVASTATIN  20 MG PO TABS   Oral   Take 20 mg by mouth every evening.         Marland Kitchen SALINE 0.65 % NA SOLN   Nasal   Place 1-2 sprays into the nose daily as needed. For nasal drainage         . TOLTERODINE TARTRATE 2 MG PO TABS   Oral   Take 2 mg by mouth 2 (two) times daily.          Marland Kitchen ZOLPIDEM TARTRATE 10 MG PO TABS   Oral   Take 10 mg by mouth at bedtime as needed. For sleep           BP 187/82  Pulse 50  Temp 98.1 F (36.7 C) (Oral)  Resp 24  Ht 5\' 4"  (1.626 m)  Wt 213 lb (96.616 kg)  BMI 36.56 kg/m2  SpO2 98%  Physical Exam  Nursing note and vitals reviewed. Constitutional: She is oriented to person, place, and time. She appears well-developed and well-nourished.  HENT:  Head: Normocephalic and atraumatic.  Eyes: Conjunctivae normal are normal. Pupils are equal, round, and reactive to light.  Neck: Normal range of motion. Neck supple. No tracheal deviation present. No thyromegaly present.  Cardiovascular: Normal rate, regular rhythm, normal heart sounds and intact distal pulses.  Exam reveals no gallop and no friction rub.   No murmur heard. Pulmonary/Chest: Effort normal and breath sounds normal. No respiratory distress. She has no wheezes. She has no rales. She exhibits no tenderness.  Abdominal: Soft. Bowel sounds are normal. She exhibits no distension. There is no tenderness.  Musculoskeletal: Normal range of motion. She exhibits no edema and no tenderness.       Normal strength,  cap refill normal  Neurological: She is alert and oriented to person, place, and time. She has normal reflexes. No cranial nerve deficit. She exhibits normal muscle tone. Coordination normal.       Decreased sensation over hand thumb 1st/2nd fingers  Skin: Skin is warm and dry. No rash noted. No erythema. No pallor.  Psychiatric: She has a normal mood and affect.    ED Course  Procedures (including critical care time)   DIAGNOSTIC STUDIES: Oxygen Saturation is 98% on room air, normal by my  interpretation.    COORDINATION OF CARE:  12:23 AM Discussed treatment plan which includes follow up with hand specialist with pt at bedside and pt agreed to plan.   Labs Reviewed - No data to display No results found.   1. Wrist pain, acute   2. Hand numbness       MDM  Pt with pain, numbness to right hand wrist, do not feel due to stroke, vascular occlusion.  Suspect carpal tunnel type presentation.      I personally performed the services described in this documentation, which was scribed in my presence. The recorded information has been reviewed and is accurate.    Olivia Mackie, MD 07/22/12 1315

## 2012-07-21 NOTE — ED Notes (Signed)
Rx x 2 given for naprosyn and vicodin- d/c home with family to drive

## 2012-07-22 NOTE — Telephone Encounter (Signed)
[facility] request  Last fill date 06/14/2012.    Araina Butrick, LVN

## 2012-07-23 DIAGNOSIS — Z961 Presence of intraocular lens: Secondary | ICD-10-CM | POA: Diagnosis not present

## 2012-07-23 DIAGNOSIS — H3530 Unspecified macular degeneration: Secondary | ICD-10-CM | POA: Diagnosis not present

## 2012-07-23 DIAGNOSIS — H40059 Ocular hypertension, unspecified eye: Secondary | ICD-10-CM | POA: Diagnosis not present

## 2012-07-29 ENCOUNTER — Encounter: Payer: Self-pay | Admitting: Gastroenterology

## 2012-07-29 DIAGNOSIS — N950 Postmenopausal bleeding: Secondary | ICD-10-CM | POA: Diagnosis not present

## 2012-07-29 DIAGNOSIS — R310 Gross hematuria: Secondary | ICD-10-CM | POA: Diagnosis not present

## 2012-07-29 DIAGNOSIS — N390 Urinary tract infection, site not specified: Secondary | ICD-10-CM | POA: Diagnosis not present

## 2012-08-01 ENCOUNTER — Other Ambulatory Visit: Payer: Self-pay | Admitting: Physician Assistant

## 2012-08-02 ENCOUNTER — Ambulatory Visit: Payer: Medicare Other | Admitting: Cardiovascular Disease

## 2012-08-19 ENCOUNTER — Encounter: Payer: Self-pay | Admitting: Cardiovascular Disease

## 2012-08-19 ENCOUNTER — Ambulatory Visit (INDEPENDENT_AMBULATORY_CARE_PROVIDER_SITE_OTHER): Payer: Medicare Other | Admitting: Cardiovascular Disease

## 2012-08-19 VITALS — Wt 210.4 lb

## 2012-08-19 DIAGNOSIS — I701 Atherosclerosis of renal artery: Secondary | ICD-10-CM | POA: Diagnosis not present

## 2012-08-19 DIAGNOSIS — I498 Other specified cardiac arrhythmias: Secondary | ICD-10-CM | POA: Diagnosis not present

## 2012-08-19 DIAGNOSIS — I10 Essential (primary) hypertension: Secondary | ICD-10-CM

## 2012-08-19 DIAGNOSIS — R609 Edema, unspecified: Secondary | ICD-10-CM

## 2012-08-19 DIAGNOSIS — R001 Bradycardia, unspecified: Secondary | ICD-10-CM

## 2012-08-19 NOTE — Assessment & Plan Note (Signed)
Venous insuficiency continue diuretic Ok to proceed with venous ligation.  BP not that bad and iotragenic bradycardia from clonidine and lopresser should be improved with decreasing beta blocker

## 2012-08-19 NOTE — Assessment & Plan Note (Signed)
Exacerbated by bradycardia.  Check renal artery duplex to R/O RAS

## 2012-08-19 NOTE — Patient Instructions (Addendum)
Your physician has requested that you have a renal artery duplex. During this test, an ultrasound is used to evaluate blood flow to the kidneys. Allow one hour for this exam. Do not eat after midnight the day before and avoid carbonated beverages. Take your medications as you usually do.  Your physician recommends that you continue on your current medications as directed. Please refer to the Current Medication list given to you today.  Your physician recommends that you schedule a follow-up appointment in: 4 to 5 weeks

## 2012-08-19 NOTE — Assessment & Plan Note (Signed)
Decrease lopresser from 100 bid to 50 bid and wean further base on response Would like HR to be in 60's to limit reflex afterload reduction that is making BP harder to Rx

## 2012-08-19 NOTE — Progress Notes (Signed)
Patient ID: Melissa Murphy, female   DOB: [birth year], 78 y.o.   MRN: [id number] 77 yo with longstanding HTN. Labile lately  Clonidine started last few months and HR has been in 40's.  Also on relatively high dose of lopressor at 100bid.  No signs of end organ damage, normal Cr, no retinal problems and no LVH on ECG.  She has chronic LE venous insuficiency and wants to have some ligation procedure. No cardiac symptoms No chest pain dyspnea, palpitations or syncope.  No history of TIA or stroke.  Compliant with meds.    ROS: Denies fever, malais, weight loss, blurry vision, decreased visual acuity, cough, sputum, SOB, hemoptysis, pleuritic pain, palpitaitons, heartburn, abdominal pain, melena, lower extremity edema, claudication, or rash.  All other systems reviewed and negative   General: Affect appropriate Deaf since birth HEENT: normal Neck supple with no adenopathy JVP normal no bruits no thyromegaly Lungs clear with no wheezing and good diaphragmatic motion Heart:  S1/S2 no murmur,rub, gallop or click PMI normal Abdomen: benighn, BS positve, no tenderness, no AAA no bruit.  No HSM or HJR Distal pulses intact with no bruits No edema Neuro non-focal Skin warm and dry No muscular weakness  Medications Current Outpatient Prescriptions  Medication Sig Dispense Refill  . Acetaminophen 500 MG coapsule Take 500 mg by mouth every 6 (six) hours as needed. For pain      . aspirin 81 MG tablet Take 81 mg by mouth daily.       . Carboxymeth-Glycerin-Polysorb 0.5-1-0.5 % SOLN Place 1 drop into both eyes daily as needed. For eye lubricant      . cloNIDine (CATAPRES) 0.1 MG tablet Take 1 tablet (0.1 mg total) by mouth 2 (two) times daily.  60 tablet  3  . dicyclomine (BENTYL) 20 MG tablet Take 1 tablet (20 mg total) by mouth every 6 (six) hours as needed (abdominal cramps).  20 tablet  0  . dorzolamide-timolol (COSOPT) 22.3-6.8 MG/ML ophthalmic solution Place 1 drop into the right eye 2  (two) times daily.      . ferrous sulfate dried (SLOW FE) 160 (50 FE) MG TBCR Take 160 mg by mouth daily.      . furosemide (LASIX) 20 MG tablet Take 20 mg by mouth every morning.       Marland Kitchen HYDROcodone-acetaminophen (NORCO/VICODIN) 5-325 MG per tablet Take 1-2 tablets by mouth every 4 (four) hours as needed.  30 tablet  0  . metoprolol (LOPRESSOR) 50 MG tablet Take 100 mg by mouth 2 (two) times daily.      . metoprolol (LOPRESSOR) 50 MG tablet TAKE 2 TABLETS (100 MG TOTAL) BY MOUTH 2 (TWO) TIMES DAILY.  60 tablet  2  . naproxen (NAPROSYN) 250 MG tablet Take 1 tablet (250 mg total) by mouth 2 (two) times daily with a meal.  30 tablet  0  . omeprazole (PRILOSEC) 20 MG capsule Take 20 mg by mouth 2 (two) times daily.       . permethrin (ELIMITE) 5 % cream Apply from chin to toes and leave on overnight and then shower.  60 g  0  . potassium chloride (K-DUR,KLOR-CON) 10 MEQ tablet Take 10 mEq by mouth daily.       . ramipril (ALTACE) 10 MG capsule Take 1 capsule (10 mg total) by mouth 2 (two) times daily.  60 capsule  5  . simvastatin (ZOCOR) 20 MG tablet Take 20 mg by mouth every evening.      . sodium  chloride (OCEAN) 0.65 % SOLN nasal spray Place 1-2 sprays into the nose daily as needed. For nasal drainage      . tolterodine (DETROL) 2 MG tablet Take 2 mg by mouth 2 (two) times daily.       Marland Kitchen zolpidem (AMBIEN) 10 MG tablet Take 10 mg by mouth at bedtime as needed. For sleep        Allergies Clindamycin/lincomycin; Novocain; and Sulfa antibiotics  Family History: Family History  Problem Relation Age of Onset  . Esophageal cancer Neg Hx   . Rectal cancer Neg Hx   . Stomach cancer Neg Hx     Social History: History   Social History  . Marital Status: Divorced    Spouse Name: N/A    Number of Children: N/A  . Years of Education: Grad    Occupational History  . Teacher    Social History Main Topics  . Smoking status: Never Smoker   . Smokeless tobacco: Not on file  . Alcohol Use:  Yes     Comment: occasionally  . Drug Use: No  . Sexually Active: Not on file   Other Topics Concern  . Not on file   Social History Narrative  . No narrative on file    Electrocardiogram:  SB rate 43 PR 236 no LVH otherwise normal  Assessment and Plan

## 2012-08-22 ENCOUNTER — Ambulatory Visit: Payer: Medicare Other | Admitting: Gastroenterology

## 2012-08-28 ENCOUNTER — Encounter (INDEPENDENT_AMBULATORY_CARE_PROVIDER_SITE_OTHER): Payer: Medicare Other

## 2012-08-28 DIAGNOSIS — I10 Essential (primary) hypertension: Secondary | ICD-10-CM

## 2012-08-28 DIAGNOSIS — I701 Atherosclerosis of renal artery: Secondary | ICD-10-CM

## 2012-08-29 DIAGNOSIS — D259 Leiomyoma of uterus, unspecified: Secondary | ICD-10-CM | POA: Diagnosis not present

## 2012-08-29 DIAGNOSIS — N950 Postmenopausal bleeding: Secondary | ICD-10-CM | POA: Diagnosis not present

## 2012-09-06 DIAGNOSIS — Z23 Encounter for immunization: Secondary | ICD-10-CM | POA: Diagnosis not present

## 2012-09-10 ENCOUNTER — Ambulatory Visit (INDEPENDENT_AMBULATORY_CARE_PROVIDER_SITE_OTHER): Payer: Medicare Other | Admitting: Gastroenterology

## 2012-09-10 ENCOUNTER — Encounter: Payer: Self-pay | Admitting: Gastroenterology

## 2012-09-10 VITALS — BP 148/96 | HR 84 | Ht 64.0 in | Wt 212.0 lb

## 2012-09-10 DIAGNOSIS — R142 Eructation: Secondary | ICD-10-CM

## 2012-09-10 DIAGNOSIS — R6881 Early satiety: Secondary | ICD-10-CM | POA: Insufficient documentation

## 2012-09-10 DIAGNOSIS — R141 Gas pain: Secondary | ICD-10-CM

## 2012-09-10 DIAGNOSIS — R140 Abdominal distension (gaseous): Secondary | ICD-10-CM

## 2012-09-10 NOTE — Patient Instructions (Addendum)
You have been scheduled for an Upper GI Series at [facility]. Your appointment is on 09/16/2012 at 9:30am. Please arrive 15 minutes prior to your test for registration. Make sure not to eat or drink anything after midnight on the night before your test. If you need to reschedule, please call radiology at [phone]. ________________________________________________________________ An upper GI series uses x rays to help diagnose problems of the upper GI tract, which includes the esophagus, stomach, and duodenum. The duodenum is the first part of the small intestine. An upper GI series is conducted by a radiology technologist or a radiologist-a doctor who specializes in x-ray imaging-at a hospital or outpatient center. While sitting or standing in front of an x-ray machine, the patient drinks barium liquid, which is often white and has a chalky consistency and taste. The barium liquid coats the lining of the upper GI tract and makes signs of disease show up more clearly on x rays. X-ray video, called fluoroscopy, is used to view the barium liquid moving through the esophagus, stomach, and duodenum. Additional x rays and fluoroscopy are performed while the patient lies on an x-ray table. To fully coat the upper GI tract with barium liquid, the technologist or radiologist may press on the abdomen or ask the patient to change position. Patients hold still in various positions, allowing the technologist or radiologist to take x rays of the upper GI tract at different angles. If a technologist conducts the upper GI series, a radiologist will later examine the images to look for problems.  This test typically takes about 1 hour to complete. __________________________________________________________________

## 2012-09-10 NOTE — Assessment & Plan Note (Signed)
Early satiety is probably 2 to the intrathoracic stomach. It appears that she may have a gastric volvulus.  Recommendations #1 upper GI series for further delineation of anatomy

## 2012-09-10 NOTE — Progress Notes (Signed)
History of Present Illness: 77 year old white female referred for evaluation of early satiety. She has occasional pyrosis. She denies dysphagia, nausea or vomiting. Weight has been stable..  CT scan in November, 2013, which I reviewed, demonstrates a intrathoracic stomach with an organoaxial rotation.    Past Medical History  Diagnosis Date  . GERD (gastroesophageal reflux disease)   . Hypertension   . Hyperlipidemia   . Glaucoma   . Urinary incontinence   . Edema   . Anemia   . Arthritis   . DVT (deep venous thrombosis)     in eye, and leg  . Osteoporosis   . Ulcer   . Hernia   . Deaf    Past Surgical History  Procedure Date  . Cataract extraction     both eyes  . Goiter removed     age 73  . Xanthoma tumor removed   . Refractive surgery    family history is negative for Esophageal cancer, and Rectal cancer, and Stomach cancer, . Current Outpatient Prescriptions  Medication Sig Dispense Refill  . aspirin 81 MG tablet Take 81 mg by mouth daily.       . Carboxymeth-Glycerin-Polysorb 0.5-1-0.5 % SOLN Place 1 drop into both eyes daily as needed. For eye lubricant      . cloNIDine (CATAPRES) 0.1 MG tablet Take 1 tablet (0.1 mg total) by mouth 2 (two) times daily.  60 tablet  3  . dorzolamide-timolol (COSOPT) 22.3-6.8 MG/ML ophthalmic solution Place 1 drop into the right eye 2 (two) times daily.      . metoprolol (LOPRESSOR) 50 MG tablet TAKE 2 TABLETS (100 MG TOTAL) BY MOUTH 2 (TWO) TIMES DAILY.  60 tablet  2  . omeprazole (PRILOSEC) 20 MG capsule Take 20 mg by mouth 2 (two) times daily.       . ramipril (ALTACE) 10 MG capsule Take 1 capsule (10 mg total) by mouth 2 (two) times daily.  60 capsule  5  . simvastatin (ZOCOR) 20 MG tablet Take 20 mg by mouth every evening.      . tolterodine (DETROL) 2 MG tablet Take 2 mg by mouth 2 (two) times daily.       Marland Kitchen zolpidem (AMBIEN) 10 MG tablet Take 10 mg by mouth at bedtime as needed. For sleep       Allergies as of 09/10/2012 -  Review Complete 09/10/2012  Allergen Reaction Noted  . Clindamycin/lincomycin Itching 09/18/2011  . Novocain (procaine)  05/02/2012  . Sulfa antibiotics  09/18/2011    reports that she has never smoked. She has never used smokeless tobacco. She reports that she drinks alcohol. She reports that she does not use illicit drugs.     Review of Systems: Pertinent positive and negative review of systems were noted in the above HPI section. All other review of systems were otherwise negative.  Vital signs were reviewed in today's medical record Physical Exam: General: Well developed , well nourished, no acute distress Skin: anicteric Head: Normocephalic and atraumatic Eyes:  sclerae anicteric, EOMI Ears: Normal auditory acuity Mouth: No deformity or lesions Neck: Supple, no masses or thyromegaly Lungs: Clear throughout to auscultation Heart: Regular rate and rhythm; no murmurs, rubs or bruits Abdomen: Soft, non tender and non distended. No masses, hepatosplenomegaly or hernias noted. Normal Bowel sounds Rectal:deferred Musculoskeletal: Symmetrical with no gross deformities  Skin: No lesions on visible extremities Pulses:  Normal pulses noted Extremities: No clubbing, cyanosis, or deformities noted. There is 2+ ankle edema in the left  lower extremity. There chronic venous stasis changes. Neurological: Alert oriented x 4, grossly nonfocal Cervical Nodes:  No significant cervical adenopathy Inguinal Nodes: No significant inguinal adenopathy Psychological:  Alert and cooperative. Normal mood and affect

## 2012-09-16 ENCOUNTER — Ambulatory Visit (HOSPITAL_COMMUNITY)
Admission: RE | Admit: 2012-09-16 | Discharge: 2012-09-16 | Disposition: A | Discharge status: Home / Self Care | Payer: Medicare Other | Source: Ambulatory Visit | Attending: Gastroenterology | Admitting: Gastroenterology

## 2012-09-16 DIAGNOSIS — R6881 Early satiety: Secondary | ICD-10-CM | POA: Insufficient documentation

## 2012-09-16 DIAGNOSIS — R141 Gas pain: Secondary | ICD-10-CM | POA: Diagnosis not present

## 2012-09-16 DIAGNOSIS — R140 Abdominal distension (gaseous): Secondary | ICD-10-CM

## 2012-09-16 DIAGNOSIS — K449 Diaphragmatic hernia without obstruction or gangrene: Secondary | ICD-10-CM | POA: Insufficient documentation

## 2012-09-16 DIAGNOSIS — K5900 Constipation, unspecified: Secondary | ICD-10-CM | POA: Diagnosis not present

## 2012-09-16 DIAGNOSIS — R142 Eructation: Secondary | ICD-10-CM | POA: Insufficient documentation

## 2012-09-20 ENCOUNTER — Inpatient Hospital Stay (HOSPITAL_COMMUNITY): Admission: RE | Admit: 2012-09-20 | Payer: Medicare Other | Source: Ambulatory Visit

## 2012-09-23 ENCOUNTER — Ambulatory Visit: Payer: Medicare Other | Admitting: Cardiovascular Disease

## 2012-09-30 ENCOUNTER — Other Ambulatory Visit: Payer: Self-pay | Admitting: Internal Medicine

## 2012-09-30 ENCOUNTER — Ambulatory Visit (INDEPENDENT_AMBULATORY_CARE_PROVIDER_SITE_OTHER): Payer: Medicare Other | Admitting: Gastroenterology

## 2012-09-30 ENCOUNTER — Encounter: Payer: Self-pay | Admitting: Gastroenterology

## 2012-09-30 ENCOUNTER — Other Ambulatory Visit: Payer: Self-pay | Admitting: Family Medicine

## 2012-09-30 VITALS — BP 138/96 | HR 64 | Ht 63.5 in | Wt 210.1 lb

## 2012-09-30 DIAGNOSIS — K319 Disease of stomach and duodenum, unspecified: Secondary | ICD-10-CM | POA: Diagnosis not present

## 2012-09-30 DIAGNOSIS — K3189 Other diseases of stomach and duodenum: Secondary | ICD-10-CM | POA: Insufficient documentation

## 2012-09-30 NOTE — Patient Instructions (Addendum)
You have been scheduled for an appointment with Dr Derrell Lolling at [facility]. Your appointment is on 10/07/2012 at 3pm. Please arrive at 2pm  for registration. Make certain to bring a list of current medications, including any over the counter medications or vitamins. Also bring your co-pay if you have one as well as your insurance cards. [facility] is located at [address]. Should you need to reschedule your appointment, please contact them at [phone].

## 2012-09-30 NOTE — Progress Notes (Signed)
History of Present Illness:  Patient has returned following upper GI series.  This demonstrated a large hilar hernia and rotated stomach suggestive of  organoaxial volvulus. She continues to complain of postprandial fullness and early satiety.    Review of Systems: Pertinent positive and negative review of systems were noted in the above HPI section. All other review of systems were otherwise negative.    Current Medications, Allergies, Past Medical History, Past Surgical History, Family History and Social History were reviewed in [employer] [facility] electronic medical record  Vital signs were reviewed in today's medical record. Physical Exam: General: Well developed , well nourished, no acute distress

## 2012-09-30 NOTE — Assessment & Plan Note (Addendum)
Findings were explained to the patient including the risk for an acute volvulus with obstruction which would constitute a surgical emergency. Findings certainly would account for her symptoms.  Recommendations #1 surgical referral for consideration of laparoscopic repair

## 2012-10-01 NOTE — Telephone Encounter (Signed)
[facility] request  Last fill date 12/12/2011.    Adreonna Yontz, LVN

## 2012-10-02 ENCOUNTER — Other Ambulatory Visit: Payer: Self-pay | Admitting: Internal Medicine

## 2012-10-02 ENCOUNTER — Telehealth: Payer: Self-pay

## 2012-10-02 ENCOUNTER — Ambulatory Visit (INDEPENDENT_AMBULATORY_CARE_PROVIDER_SITE_OTHER): Payer: Medicare Other | Admitting: Family Medicine

## 2012-10-02 VITALS — BP 148/110 | HR 88 | Temp 98.8°F | Resp 18 | Wt 208.0 lb

## 2012-10-02 DIAGNOSIS — J42 Unspecified chronic bronchitis: Secondary | ICD-10-CM

## 2012-10-02 MED ORDER — RAMIPRIL 10 MG CAPSULE
1.0000 | ORAL_CAPSULE | Freq: Every day | ORAL | Status: DC
Start: 2012-10-02 — End: 2013-02-21

## 2012-10-02 MED ORDER — DOXYCYCLINE HYCLATE 100 MG PO TABS: 100.0000 mg | ORAL_TABLET | Freq: Two times a day (BID) | ORAL | Status: DC

## 2012-10-02 MED ORDER — CLONIDINE HCL 0.1 MG PO TABS: ORAL_TABLET | ORAL | Status: DC

## 2012-10-02 NOTE — Patient Instructions (Addendum)
Very nice to meet you.  I am sending you in a medication called doxycycline. Take one pill twice daily for the next 7 days. I also have refilled your clonidine. Good luck with your surgery I hope all goes well. After her surgery you should probably followup with her primary care provider when you are feeling well.  Bronchitis Bronchitis is the body's way of reacting to injury and/or infection (inflammation) of the bronchi. Bronchi are the air tubes that extend from the windpipe into the lungs. If the inflammation becomes severe, it may cause shortness of breath. CAUSES  Inflammation may be caused by:  A virus.  Germs (bacteria).  Dust.  Allergens.  Pollutants and many other irritants. The cells lining the bronchial tree are covered with tiny hairs (cilia). These constantly beat upward, away from the lungs, toward the mouth. This keeps the lungs free of pollutants. When these cells become too irritated and are unable to do their job, mucus begins to develop. This causes the characteristic cough of bronchitis. The cough clears the lungs when the cilia are unable to do their job. Without either of these protective mechanisms, the mucus would settle in the lungs. Then you would develop pneumonia. Smoking is a common cause of bronchitis and can contribute to pneumonia. Stopping this habit is the single most important thing you can do to help yourself. TREATMENT   Your caregiver may prescribe an antibiotic if the cough is caused by bacteria. Also, medicines that open up your airways make it easier to breathe. Your caregiver may also recommend or prescribe an expectorant. It will loosen the mucus to be coughed up. Only take over-the-counter or prescription medicines for pain, discomfort, or fever as directed by your caregiver.  Removing whatever causes the problem (smoking, for example) is critical to preventing the problem from getting worse.  Cough suppressants may be prescribed for relief of  cough symptoms.  Inhaled medicines may be prescribed to help with symptoms now and to help prevent problems from returning.  For those with recurrent (chronic) bronchitis, there may be a need for steroid medicines. SEEK IMMEDIATE MEDICAL CARE IF:   During treatment, you develop more pus-like mucus (purulent sputum).  You have a fever.  Your baby is older than 3 months with a rectal temperature of 102 F (38.9 C) or higher.  Your baby is 37 months old or younger with a rectal temperature of 100.4 F (38 C) or higher.  You become progressively more ill.  You have increased difficulty breathing, wheezing, or shortness of breath. It is necessary to seek immediate medical care if you are elderly or sick from any other disease. MAKE SURE YOU:   Understand these instructions.  Will watch your condition.  Will get help right away if you are not doing well or get worse. Document Released: 07/31/2005 Document Revised: 10/23/2011 Document Reviewed: 06/09/2008 [facility] Patient Information 2013 [place], [place].

## 2012-10-02 NOTE — Telephone Encounter (Signed)
Noted, will advise provider.

## 2012-10-02 NOTE — Assessment & Plan Note (Signed)
Patient will be given doxycycline. I am been somewhat aggressive secondary with her upcoming surgery and I do not want her to have a delayed. This is likely also an exacerbation may be of the gastroesophageal reflux disease but once again we'll treat as a potential infection 2 avoid delaying surgery. Patient is a red flags and was given a handout on bronchitis. Patient will followup after her surgery for further evaluation.

## 2012-10-02 NOTE — Telephone Encounter (Signed)
PT WILL BE COMING IN FOR OV TODAY.  SHE IS DEAF AND HAD AN INTERPRETOR CALL TO LET us KNOW SHE WILL ALSO NEED A REFILL ON HER CLONIDINE.  THE BOTTLE SAYS NO MORE REFILLS.

## 2012-10-02 NOTE — Progress Notes (Signed)
Chief complaint illness  History of present illness: Patient is a 77 year old deaf individual who is coming in with 4 day history of cough, body aches, and overall not feeling well. Patient has had significant amount of bronchitis infections in the recent time. Patient does also have significant gastroesophageal reflux disease with a hiatal hernia that she is going to have repaired on February 28. Patient denies any chest pain, any significant shortness of breath, or any fevers or chills. Patient though states that she does have a cough which is productive with significant amount of mucous fistula to green in color. Patient denies any blood in her mucus. Patient states that all the symptoms seem to be correlating with what she's had previously with her bronchitis.  Past medical history, social, surgical and family history all reviewed.   Physical exam General: No apparent distress patient is accompanied by a sign language interpreter. HEENT: Pupils equal round reactive to light and accommodation, extra ocular movements intact nares patent patient does have some mild erythematous of the turbinates. Patient does have a positive for posterior pharynx erythema. Patient does have a postnasal drip

## 2012-10-02 NOTE — Telephone Encounter (Signed)
Pt is requesting a 90day supply for the marked above med to be faxed to the marked above Pharmacy.

## 2012-10-04 ENCOUNTER — Encounter (HOSPITAL_COMMUNITY)
Admission: RE | Admit: 2012-10-04 | Discharge: 2012-10-04 | Disposition: A | Discharge status: Home / Self Care | Payer: Medicare Other | Source: Ambulatory Visit | Attending: Obstetrics and Gynecology | Admitting: Obstetrics and Gynecology

## 2012-10-04 DIAGNOSIS — N950 Postmenopausal bleeding: Secondary | ICD-10-CM | POA: Diagnosis not present

## 2012-10-04 DIAGNOSIS — N840 Polyp of corpus uteri: Secondary | ICD-10-CM | POA: Diagnosis not present

## 2012-10-04 LAB — COMPREHENSIVE METABOLIC PANEL
ALT: 13 U/L (ref 0–35)
AST: 24 U/L (ref 0–37)
Alkaline Phosphatase: 74 U/L (ref 39–117)
Calcium: 9.7 mg/dL (ref 8.4–10.5)
GFR calc Af Amer: 79 mL/min — ABNORMAL LOW (ref >90)
Glucose, Bld: 95 mg/dL (ref 70–99)
Potassium: 4.1 mEq/L (ref 3.5–5.1)
Sodium: 138 mEq/L (ref 135–145)
Total Protein: 7.3 g/dL (ref 6.0–8.3)

## 2012-10-04 LAB — CBC
Hemoglobin: 13.3 g/dL (ref 12.0–15.0)
MCHC: 33 g/dL (ref 30.0–36.0)
Platelets: 180 10*3/uL (ref 150–400)
RBC: 4.39 MIL/uL (ref 3.87–5.11)

## 2012-10-04 NOTE — Patient Instructions (Addendum)
Your procedure is scheduled on:10/11/12  Enter through the Main Entrance at :6am Pick up desk phone and dial [phone] and inform us of your arrival.  Please call [phone] if you have any problems the morning of surgery.  Remember: Do not eat or drink after midnight:Thursday   Take these meds the morning of surgery with a sip of water: Ramipril, Metoprolol, Clonidine  DO NOT wear jewelry, eye make-up, lipstick,body lotion, or dark fingernail polish. Do not shave for 48 hours prior to surgery.   Patients discharged on the day of surgery will not be allowed to drive home.

## 2012-10-07 ENCOUNTER — Encounter (INDEPENDENT_AMBULATORY_CARE_PROVIDER_SITE_OTHER): Payer: Self-pay | Admitting: General Surgery

## 2012-10-07 ENCOUNTER — Other Ambulatory Visit (INDEPENDENT_AMBULATORY_CARE_PROVIDER_SITE_OTHER): Payer: Self-pay | Admitting: General Surgery

## 2012-10-07 ENCOUNTER — Ambulatory Visit (INDEPENDENT_AMBULATORY_CARE_PROVIDER_SITE_OTHER): Payer: Medicare Other | Admitting: General Surgery

## 2012-10-07 VITALS — BP 128/72 | HR 58 | Temp 98.7°F | Resp 18 | Ht 63.0 in | Wt 208.0 lb

## 2012-10-07 DIAGNOSIS — K319 Disease of stomach and duodenum, unspecified: Secondary | ICD-10-CM

## 2012-10-07 DIAGNOSIS — N840 Polyp of corpus uteri: Secondary | ICD-10-CM

## 2012-10-07 DIAGNOSIS — K3189 Other diseases of stomach and duodenum: Secondary | ICD-10-CM

## 2012-10-07 NOTE — Progress Notes (Signed)
Patient ID: Melissa Murphy, female   DOB: Dec 27, 1935, 77 y.o.   MRN: [id number]  Chief Complaint  Patient presents with  . New Evaluation    gastric voloalus    HPI Melissa Murphy is a 77 y.o. female.  She is referred by Dr. Melvia Heaps for evaluation and surgical management of a giant hiatal hernia. Dr. Charlton Haws is her cardiologist Dr. Ellamae Sia is her primary care physician.  The patient has a greater than one-year history of early satiety, and because of this she small amounts at each meal and limits what she eats. She's only lost about 10 pounds in the past year, however. She doesn't regurgitate or vomit much but she did have a lot of vomiting when she was on a trip in [place] and ate lot of food at a buffet.  . She states that she elevates her head of bed at night because of trouble breathing and pressure. She has occasional reflux, but not severe. She denies pain when she swallows. She feels a little bloated.  She had a colonoscopy in January for colorectal cancer screening and that was unremarkable. She had an upper GI which shows a large hiatal hernia with organoaxial rotation. She also had a CT scan recently which shows large hiatal hernia with organoaxial rotation. The CT scan did not show any gross abnormalities otherwise except for diverticulosis. She has not had an upper endoscopy.  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 Occasional bronchitis. Per patient she has had recent Doppler exam which was negative. She's never had a myocardial infarction. She is followed by Charlton Haws. She has surgery at age 30 through a large incision at the right inframammary fold above the costal margin. 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 Umbilical herniorrhaphy with mesh 15 years ago in [place]. She does plan to have a minor surgery for removal of uterine polyps this coming Friday.  She is  deaf and does not speak. She is very pleasant and a signing interpreter is with her. She has good comprehension and insight.  HPI  Past Medical History  Diagnosis Date  . GERD (gastroesophageal reflux disease)   . Hypertension   . Hyperlipidemia   . Glaucoma   . Urinary incontinence   . Edema   . Anemia   . Arthritis   . DVT (deep venous thrombosis)     in eye, and leg  . Osteoporosis   . Ulcer   . Hernia   . Deaf   . Uterine polyp   . Hiatal hernia     Past Surgical History  Procedure Laterality Date  . Cataract extraction      both eyes  . Goiter removed      age 69  . Xanthoma tumor removed    . Refractive surgery      Family History  Problem Relation Age of Onset  . Esophageal cancer Neg Hx   . Rectal cancer Neg Hx   . Stomach cancer Neg Hx   . Cancer Mother     colon  . Stroke Mother   . Heart disease Father     35 yrs old  . Heart disease Brother     MI,   . Hernia Brother   . Migraines Daughter     Social History History  Substance Use Topics  . Smoking status: Never Smoker   . Smokeless tobacco: Never Used  .  Alcohol Use: Yes     Comment: occasionally    Allergies  Allergen Reactions  . Avastin (Bevacizumab) Nausea And Vomiting  . Clindamycin/Lincomycin Itching  . Novocain (Procaine)     seizures  . Sulfa Antibiotics     fever    Current Outpatient Prescriptions  Medication Sig Dispense Refill  . cloNIDine (CATAPRES) 0.1 MG tablet TAKE 1 TABLET (0.1 MG TOTAL) BY MOUTH 2 (TWO) TIMES DAILY.  60 tablet  3  . dorzolamide-timolol (COSOPT) 22.3-6.8 MG/ML ophthalmic solution Place 1 drop into the right eye 2 (two) times daily.      . metoprolol (LOPRESSOR) 50 MG tablet       . omeprazole (PRILOSEC) 20 MG capsule TAKE 1 CAPSULE BY MOUTH 2 TIMES DAILY.  180 capsule  0  . ramipril (ALTACE) 10 MG capsule TAKE 1 CAPSULE (10 MG TOTAL) BY MOUTH 2 (TWO) TIMES DAILY.  60 capsule  3  . simvastatin (ZOCOR) 20 MG tablet Take 20 mg by mouth every  evening.      . tolterodine (DETROL) 2 MG tablet Take 2 mg by mouth 2 (two) times daily.       Marland Kitchen zolpidem (AMBIEN) 10 MG tablet Take 10 mg by mouth at bedtime as needed. For sleep      . aspirin 81 MG tablet Take 81 mg by mouth daily.       . Cholecalciferol (VITAMIN D-3 PO) Take 1 tablet by mouth daily.       No current facility-administered medications for this visit.    Review of Systems Review of Systems  Constitutional: Negative for fever, chills and unexpected weight change.  HENT: Negative for hearing loss, congestion, sore throat, trouble swallowing and voice change.   Eyes: Negative for visual disturbance.  Respiratory: Positive for cough and shortness of breath. Negative for wheezing.   Cardiovascular: Negative for chest pain, palpitations and leg swelling.  Gastrointestinal: Positive for nausea, vomiting and abdominal pain. Negative for diarrhea, constipation, blood in stool, abdominal distention and anal bleeding.  Genitourinary: Negative for hematuria, vaginal bleeding and difficulty urinating.  Musculoskeletal: Negative for arthralgias.  Skin: Negative for rash and wound.  Neurological: Negative for seizures, syncope and headaches.  Hematological: Negative for adenopathy. Does not bruise/bleed easily.  Psychiatric/Behavioral: Negative for confusion.    Blood pressure 128/72, pulse 58, temperature 98.7 F (37.1 C), resp. rate 18, height 5\' 3"  (1.6 m), weight 208 lb (94.348 kg).  Physical Exam Physical Exam  Constitutional: She is oriented to person, place, and time. She appears well-developed and well-nourished. No distress.  HENT:  Head: Normocephalic and atraumatic.  Nose: Nose normal.  Mouth/Throat: No oropharyngeal exudate.  Eyes: Conjunctivae and EOM are normal. Pupils are equal, round, and reactive to light. Left eye exhibits no discharge. No scleral icterus.  Neck: Neck supple. No JVD present. No tracheal deviation present. No thyromegaly present.    Cardiovascular: Normal rate, regular rhythm, normal heart sounds and intact distal pulses.   No murmur heard. Pulmonary/Chest: Effort normal and breath sounds normal. No respiratory distress. She has no wheezes. She has no rales. She exhibits no tenderness.    Long healed scar right inframammary fold.  Abdominal: Soft. Bowel sounds are normal. She exhibits no distension and no mass. There is no tenderness. There is no rebound and no guarding.    Small, curved, transverse infraumbilical incision. Protuberant lower abdomen. No hernias noted. Palpable bilateral femoral pulses.  Musculoskeletal: She exhibits no edema and no tenderness.  Lymphadenopathy:  She has no cervical adenopathy.  Neurological: She is alert and oriented to person, place, and time. She exhibits normal muscle tone. Coordination normal.  Skin: Skin is warm. No rash noted. She is not diaphoretic. No erythema. No pallor.  Psychiatric: She has a normal mood and affect. Her behavior is normal. Judgment and thought content normal.    Data Reviewed Case discussed with Dr. Melvia Heaps. Upper GI. CT scan. Physician's notes.  Assessment    Giant hiatal hernia with organoaxial rotation. She is at risk for volvulus and strangulation and she was advised to have an elective operation before this happens.  Negative colonoscopy January 2014  History of umbilical hernia repair with mesh 15 years ago in [place]  History thyroidectomy for benign disease  History right inframammary incision age 11, presumed benign subcutaneous tumor  Hypertension  History DVT left leg approximately 2008, on Coumadin for one year. Recent Dopplers negative.     Plan    I discussed the anatomy and pathophysiology of her symptoms as it relates to her giant hiatal hernia. I advised her to have an elective operation before she develops obstruction or strangulation, and she agrees.  She will be referred to Dr. Charlton Haws for cardiac  clearance.  She will be referred to Dr. Melvia Heaps for upper endoscopy to make sure there is no mucosal lesion of the esophagus, stomach, or duodenum  Gallbladder ultrasound will be obtained  She will return to see me after this is done and we will try to proceed with surgery to correct her hernia in March  I discussed the indications, details, techniques, and numerous risk of the surgery with her. She understands all these issues. All of her questions are answered. She agrees with this plan.       Angelia Mould. Derrell Lolling, M.D., [facility] General and Minimally invasive Surgery Breast and Colorectal Surgery Office:   [phone] Pager:   [phone]  10/07/2012, 4:22 PM

## 2012-10-07 NOTE — Patient Instructions (Signed)
You have a large hiatal hernia and the stomach is beginning to turn upside down. This is starting to cause a partial blockage, or obstruction. This may progress to a complete obstruction and a dangerous emergency surgery in the future.  You are advised to have an elective operation to repair your hiatal hernia before it gets to that point. We have discussed this in detail today.  You'll be scheduled for an upper endoscopy with Dr. Melvia Heaps. He will be scheduled for a gallbladder ultrasound to make sure he did not have gallstones You'll be scheduled for a cardiac evaluation and clearance by Dr. Charlton Haws.  You'll be scheduled for an appointment to return to see Dr. Derrell Lolling to discuss the surgery one more time in 3 weeks after all of these other tests are done.  Please read over the booklet that I gave you about hiatal hernia surgery.     Hiatal Hernia A hiatal hernia occurs when a part of the stomach slides above the diaphragm. The diaphragm is the thin muscle separating the belly (abdomen) from the chest. A hiatal hernia can be something you are born with or develop over time. Hiatal hernias may allow stomach acid to flow back into your esophagus, the tube which carries food from your mouth to your stomach. If this acid causes problems it is called GERD (gastro-esophageal reflux disease).  SYMPTOMS  Common symptoms of GERD are heartburn (burning in your chest). This is worse when lying down or bending over. It may also cause belching and indigestion. Some of the things which make GERD worse are:  Increased weight pushes on stomach making acid rise more easily.  Smoking markedly increases acid production.  Alcohol decreases lower esophageal sphincter pressure (valve between stomach and esophagus), allowing acid from stomach into esophagus.  Late evening meals and going to bed with a full stomach increases pressure.  Anything that causes an increase in acid production.  Lower  esophageal sphincter incompetence. DIAGNOSIS  Hiatal hernia is often diagnosed with x-rays of your stomach and small bowel. This is called an UGI (upper gastrointestinal x-ray). Sometimes a gastroscopic procedure is done. This is a procedure where your caregiver uses a flexible instrument to look into the stomach and small bowel. HOME CARE INSTRUCTIONS   Try to achieve and maintain an ideal body weight.  Avoid drinking alcoholic beverages.  Stop smoking.  Put the head of your bed on 4 to 6 inch blocks. This will keep your head and esophagus higher than your stomach. If you cannot use blocks, sleep with several pillows under your head and shoulders.  Over-the-counter medications will decrease acid production. Your caregiver can also prescribe medications for this. Take as directed.  1/2 to 1 teaspoon of an antacid taken every hour while awake, with meals and at bedtime, will neutralize acid.  Do not take aspirin, ibuprofen (Advil or Motrin), or other nonsteroidal anti-inflammatory drugs.  Do not wear tight clothing around your chest or stomach.  Eat smaller meals and eat more frequently. This keeps your stomach from getting too full. Eat slowly.  Do not lie down for 2 or 3 hours after eating. Do not eat or drink anything 1 to 2 hours before going to bed.  Avoid caffeine beverages (colas, coffee, cocoa, tea), fatty foods, citrus fruits and all other foods and drinks that contain acid and that seem to increase the problems.  Avoid bending over, especially after eating. Also avoid straining during bowel movements or when urinating or lifting things.  Anything that increases the pressure in your belly increases the amount of acid that may be pushed up into your esophagus. SEEK IMMEDIATE MEDICAL CARE IF:  There is change in location (pain in arms, neck, jaw, teeth or back) of your pain, or the pain is getting worse.  You also experience nausea, vomiting, sweating (diaphoresis), or  shortness of breath.  You develop continual vomiting, vomit blood or coffee ground material, have bright red blood in your stools, or have black tarry stools. Some of these symptoms could signal other problems such as heart disease. MAKE SURE YOU:   Understand these instructions.  Monitor your condition.  Contact your caregiver if you are not doing well or are getting worse. Document Released: 10/21/2003 Document Revised: 10/23/2011 Document Reviewed: 07/31/2005 [facility] Patient Information 2013 [place], [place].

## 2012-10-08 ENCOUNTER — Telehealth (INDEPENDENT_AMBULATORY_CARE_PROVIDER_SITE_OTHER): Payer: Self-pay | Admitting: General Surgery

## 2012-10-08 NOTE — Pre-Procedure Instructions (Signed)
I spoke with Dr. Rodman Pickle about pt's history of "marked Sinus bradycardia" per pt's Dr. Eden Emms. Per pt, Dr. Eden Emms reduced dosage of Metoprolol to increase slow pulse rate. Pulse today at PAT appt was 56, reg rate.

## 2012-10-08 NOTE — Telephone Encounter (Signed)
Called home number and left message for the patient daughter Romana Juniper) to advise of appointment made for [place] at [facility] at [address] location. I also called contact number for interpreter needed for the patient. Confirmed one will be there for her appointment.

## 2012-10-09 ENCOUNTER — Ambulatory Visit (INDEPENDENT_AMBULATORY_CARE_PROVIDER_SITE_OTHER): Payer: Medicare Other | Admitting: Cardiovascular Disease

## 2012-10-09 ENCOUNTER — Encounter: Payer: Self-pay | Admitting: Cardiovascular Disease

## 2012-10-09 VITALS — BP 130/88 | HR 64

## 2012-10-09 DIAGNOSIS — I10 Essential (primary) hypertension: Secondary | ICD-10-CM | POA: Diagnosis not present

## 2012-10-09 NOTE — Assessment & Plan Note (Signed)
Well controlled.  Continue current medications and low sodium Dash type diet.   Will need clonidine patch post op as she will be NPO after hernia surgery and BP Is quite labile without it

## 2012-10-09 NOTE — Patient Instructions (Signed)
Your physician wants you to follow-up in: YEAR WITH DR NISHAN  You will receive a reminder letter in the mail two months in advance. If you don't receive a letter, please call our office to schedule the follow-up appointment.  Your physician recommends that you continue on your current medications as directed. Please refer to the Current Medication list given to you today. 

## 2012-10-09 NOTE — Assessment & Plan Note (Signed)
Continue diuretic  Chronic venous insufficiency  And history of DVT  Needs meticulous DVT prophylaxis post op lovenox

## 2012-10-09 NOTE — Progress Notes (Signed)
Patient ID: Melissa Murphy, female   DOB: 04-28-1936, 77 y.o.   MRN: [id number] 77 yo referred by Dr Derrell Lolling for preop clearance  No real cardiac issues  Long standing HTN and LE venous insufficiency  On statin for elevated lipids  Having a uterine polyp removed tomorrow and then nees a large hiatal hernia repaired No history of CAD, CHF or afib.  History of DVT and needs meticulous post op DVT prophylaxis.  She is deaf and interview done with interpretor/signer.  Had her grand daughter Melissa Murphy with her.  No bleeding diathesis.  No previous issues with anesthesia  ROS: Denies fever, malaise, weight loss, blurry vision, decreased visual acuity, cough, sputum, SOB, hemoptysis, pleuritic pain, palpitations, heartburn, abdominal pain, melena, lower extremity edema, claudication, or rash.  All other systems reviewed and negative  General: Affect appropriate Healthy:  appears stated age HEENT: Deaf Neck supple with no adenopathy JVP normal no bruits no thyromegaly Lungs clear with no wheezing and good diaphragmatic motion Heart:  S1/S2 no murmur, no rub, gallop or click PMI normal Abdomen: benighn, BS positve, no tenderness, no AAA no bruit.  No HSM or HJR Distal pulses intact with no bruits Plus two bilateral  Edema with venous stasis Neuro non-focal Skin warm and dry No muscular weakness   Current Outpatient Prescriptions  Medication Sig Dispense Refill  . cloNIDine (CATAPRES) 0.1 MG tablet TAKE 1 TABLET (0.1 MG TOTAL) BY MOUTH 2 (TWO) TIMES DAILY.  60 tablet  3  . dorzolamide-timolol (COSOPT) 22.3-6.8 MG/ML ophthalmic solution Place 1 drop into the right eye 2 (two) times daily.      . metoprolol (LOPRESSOR) 50 MG tablet       . omeprazole (PRILOSEC) 20 MG capsule       . ramipril (ALTACE) 10 MG capsule TAKE 1 CAPSULE (10 MG TOTAL) BY MOUTH 2 (TWO) TIMES DAILY.  60 capsule  3  . simvastatin (ZOCOR) 20 MG tablet Take 20 mg by mouth every evening.      . tolterodine (DETROL) 2 MG tablet  Take 2 mg by mouth 2 (two) times daily.       Marland Kitchen zolpidem (AMBIEN) 10 MG tablet Take 10 mg by mouth at bedtime as needed. For sleep       No current facility-administered medications for this visit.    Allergies  Avastin; Clindamycin/lincomycin; Novocain; and Sulfa antibiotics  Electrocardiogram:  Assessment and Plan

## 2012-10-10 ENCOUNTER — Telehealth: Payer: Self-pay | Admitting: Cardiovascular Disease

## 2012-10-10 NOTE — Telephone Encounter (Signed)
Pt wants to know if she has sleep apnea and dose she still need to go to sleep clinic

## 2012-10-10 NOTE — H&P (Signed)
Melissa Murphy is an 77 y.o. female with post menopausal bleeding. U/S in office c/w 2.8 cm mass in endometrial cavity.   Pertinent Gynecological History: Menses: post-menopausal Bleeding: PMB Contraception: none DES exposure: denies Blood transfusions: none Sexually transmitted diseases: no past history Previous GYN Procedures: none  Last mammogram: normal Date: 2014 Last pap: normal Date: 2014 OB History: G3, P3   Menstrual History: Menarche age: unknown  No LMP recorded. Patient is postmenopausal.    Past Medical History  Diagnosis Date  . GERD (gastroesophageal reflux disease)   . Hypertension   . Hyperlipidemia   . Glaucoma   . Urinary incontinence   . Edema   . Anemia   . Arthritis   . DVT (deep venous thrombosis)     in eye, and leg  . Osteoporosis   . Ulcer   . Hernia   . Deaf     Requires an interpreter  . Uterine polyp   . Hiatal hernia     Past Surgical History  Procedure Laterality Date  . Cataract extraction      both eyes  . Goiter removed      age 20  . Xanthoma tumor removed    . Refractive surgery      Family History  Problem Relation Age of Onset  . Esophageal cancer Neg Hx   . Rectal cancer Neg Hx   . Stomach cancer Neg Hx   . Cancer Mother     colon  . Stroke Mother   . Heart disease Father     29 yrs old  . Heart disease Brother     MI,   . Hernia Brother   . Migraines Daughter     Social History:  reports that she has never smoked. She has never used smokeless tobacco. She reports that  drinks alcohol. She reports that she does not use illicit drugs.  Allergies:  Allergies  Allergen Reactions  . Avastin (Bevacizumab) Nausea And Vomiting  . Clindamycin/Lincomycin Itching  . Novocain (Procaine)     seizures  . Sulfa Antibiotics     fever    No prescriptions prior to admission    Review of Systems  Constitutional: Negative for fever.  Respiratory: Negative for shortness of breath.   Cardiovascular: Negative for  chest pain.    There were no vitals taken for this visit. Physical Exam  Cardiovascular: Normal rate and regular rhythm.   Respiratory: Effort normal and breath sounds normal.  GI: There is no tenderness.    No results found for this or any previous visit (from the past 24 hour(s)).  No results found.  Assessment/Plan: 77 yo with PMB and endometrial mass on U/S . D/W patient H/S, resectoscope, D&C. Risks reviewed including infection, organ damage, DVT/PE, pneumonia, bleeding/transfusion-HIV/Hep, recurrent polyps.  Jay Kempe II,Shirline Kendle E 10/10/2012, 6:55 PM

## 2012-10-10 NOTE — Telephone Encounter (Signed)
Left message to call back  

## 2012-10-11 ENCOUNTER — Ambulatory Visit (HOSPITAL_COMMUNITY): Payer: Medicare Other | Admitting: Anesthesiology

## 2012-10-11 ENCOUNTER — Encounter (HOSPITAL_COMMUNITY)
Admission: RE | Disposition: A | Discharge status: Home / Self Care | Payer: Self-pay | Source: Ambulatory Visit | Attending: Obstetrics and Gynecology

## 2012-10-11 ENCOUNTER — Encounter (HOSPITAL_COMMUNITY): Payer: Self-pay | Admitting: Anesthesiology

## 2012-10-11 ENCOUNTER — Encounter (HOSPITAL_COMMUNITY): Payer: Self-pay | Admitting: *Deleted

## 2012-10-11 ENCOUNTER — Ambulatory Visit (HOSPITAL_COMMUNITY)
Admission: RE | Admit: 2012-10-11 | Discharge: 2012-10-11 | Disposition: A | Discharge status: Home / Self Care | Payer: Medicare Other | Source: Ambulatory Visit | Attending: Obstetrics and Gynecology | Admitting: Obstetrics and Gynecology

## 2012-10-11 DIAGNOSIS — K219 Gastro-esophageal reflux disease without esophagitis: Secondary | ICD-10-CM | POA: Diagnosis not present

## 2012-10-11 DIAGNOSIS — N840 Polyp of corpus uteri: Secondary | ICD-10-CM | POA: Insufficient documentation

## 2012-10-11 DIAGNOSIS — N950 Postmenopausal bleeding: Secondary | ICD-10-CM | POA: Insufficient documentation

## 2012-10-11 HISTORY — PX: DILATATION & CURRETTAGE/HYSTEROSCOPY WITH RESECTOCOPE: SHX5572

## 2012-10-11 SURGERY — DILATATION & CURETTAGE/HYSTEROSCOPY WITH RESECTOCOPE
Anesthesia: General | Site: Vagina | Wound class: Clean Contaminated

## 2012-10-11 MED ORDER — GLYCOPYRROLATE 0.2 MG/ML IJ SOLN
INTRAMUSCULAR | Status: AC
  Filled 2012-10-11: qty 1

## 2012-10-11 MED ORDER — EPHEDRINE SULFATE 50 MG/ML IJ SOLN
INTRAMUSCULAR | Status: DC | PRN
  Administered 2012-10-11: 10 mg via INTRAVENOUS

## 2012-10-11 MED ORDER — GLYCOPYRROLATE 0.2 MG/ML IJ SOLN
INTRAMUSCULAR | Status: DC | PRN
  Administered 2012-10-11: 0.1 mg via INTRAVENOUS

## 2012-10-11 MED ORDER — PROPOFOL 10 MG/ML IV EMUL
INTRAVENOUS | Status: AC
  Filled 2012-10-11: qty 20

## 2012-10-11 MED ORDER — FENTANYL CITRATE 0.05 MG/ML IJ SOLN
INTRAMUSCULAR | Status: AC
  Filled 2012-10-11: qty 2

## 2012-10-11 MED ORDER — CEFAZOLIN SODIUM-DEXTROSE 2-3 GM-% IV SOLR
INTRAVENOUS | Status: AC
  Filled 2012-10-11: qty 50

## 2012-10-11 MED ORDER — ONDANSETRON HCL 4 MG/2ML IJ SOLN
INTRAMUSCULAR | Status: DC | PRN
  Administered 2012-10-11: 4 mg via INTRAVENOUS

## 2012-10-11 MED ORDER — KETOROLAC TROMETHAMINE 30 MG/ML IJ SOLN
INTRAMUSCULAR | Status: DC | PRN
  Administered 2012-10-11: 15 mg via INTRAVENOUS

## 2012-10-11 MED ORDER — EPHEDRINE 5 MG/ML INJ
INTRAVENOUS | Status: AC
  Filled 2012-10-11: qty 10

## 2012-10-11 MED ORDER — LACTATED RINGERS IV SOLN
INTRAVENOUS | Status: DC
  Administered 2012-10-11: 1000 mL via INTRAVENOUS

## 2012-10-11 MED ORDER — BUPIVACAINE HCL (PF) 0.25 % IJ SOLN
INTRAMUSCULAR | Status: AC
  Filled 2012-10-11: qty 30

## 2012-10-11 MED ORDER — BUPIVACAINE HCL (PF) 0.25 % IJ SOLN: INTRAMUSCULAR | Status: DC | PRN

## 2012-10-11 MED ORDER — MIDAZOLAM HCL 5 MG/5ML IJ SOLN
INTRAMUSCULAR | Status: DC | PRN
  Administered 2012-10-11: 0.5 mg via INTRAVENOUS

## 2012-10-11 MED ORDER — LIDOCAINE HCL (CARDIAC) 20 MG/ML IV SOLN
INTRAVENOUS | Status: AC
  Filled 2012-10-11: qty 5

## 2012-10-11 MED ORDER — CEFAZOLIN SODIUM-DEXTROSE 2-3 GM-% IV SOLR
2.0000 g | INTRAVENOUS | Status: AC
  Administered 2012-10-11: 2 g via INTRAVENOUS

## 2012-10-11 MED ORDER — LIDOCAINE HCL (PF) 1 % IJ SOLN
INTRAMUSCULAR | Status: DC | PRN
  Administered 2012-10-11: 20 mL

## 2012-10-11 MED ORDER — HEPARIN SODIUM (PORCINE) 5000 UNIT/ML IJ SOLN
INTRAMUSCULAR | Status: AC
  Filled 2012-10-11: qty 1

## 2012-10-11 MED ORDER — ONDANSETRON HCL 4 MG/2ML IJ SOLN
INTRAMUSCULAR | Status: AC
  Filled 2012-10-11: qty 2

## 2012-10-11 MED ORDER — HEPARIN SODIUM (PORCINE) 5000 UNIT/ML IJ SOLN
5000.0000 [IU] | Freq: Once | INTRAMUSCULAR | Status: AC
  Administered 2012-10-11: 5000 [IU] via SUBCUTANEOUS
  Filled 2012-10-11: qty 1

## 2012-10-11 MED ORDER — PROPOFOL 10 MG/ML IV EMUL
INTRAVENOUS | Status: DC | PRN
  Administered 2012-10-11: 150 mg via INTRAVENOUS

## 2012-10-11 MED ORDER — FENTANYL CITRATE 0.05 MG/ML IJ SOLN
INTRAMUSCULAR | Status: DC | PRN
  Administered 2012-10-11: 50 ug via INTRAVENOUS

## 2012-10-11 MED ORDER — GLYCINE 1.5 % IR SOLN
Status: DC | PRN
  Administered 2012-10-11: 3000 mL

## 2012-10-11 MED ORDER — MIDAZOLAM HCL 2 MG/2ML IJ SOLN
INTRAMUSCULAR | Status: AC
  Filled 2012-10-11: qty 2

## 2012-10-11 MED ORDER — KETOROLAC TROMETHAMINE 30 MG/ML IJ SOLN
INTRAMUSCULAR | Status: AC
  Filled 2012-10-11: qty 1

## 2012-10-11 MED ORDER — LIDOCAINE HCL (CARDIAC) 20 MG/ML IV SOLN
INTRAVENOUS | Status: DC | PRN
  Administered 2012-10-11: 50 mg via INTRAVENOUS

## 2012-10-11 SURGICAL SUPPLY — 13 items
CANISTER SUCTION 2500CC (MISCELLANEOUS) ×2 IMPLANT
CATH ROBINSON RED A/P 16FR (CATHETERS) ×2 IMPLANT
CLOTH BEACON ORANGE TIMEOUT ST (SAFETY) ×2 IMPLANT
CONTAINER PREFILL 10% NBF 60ML (FORM) ×4 IMPLANT
DRESSING TELFA 8X3 (GAUZE/BANDAGES/DRESSINGS) ×2 IMPLANT
ELECT REM PT RETURN 9FT ADLT (ELECTROSURGICAL) ×2
ELECTRODE REM PT RTRN 9FT ADLT (ELECTROSURGICAL) ×1 IMPLANT
GLOVE BIO SURGEON STRL SZ8 (GLOVE) ×4 IMPLANT
GOWN STRL REIN XL XLG (GOWN DISPOSABLE) ×4 IMPLANT
LOOP ANGLED CUTTING 22FR (CUTTING LOOP) IMPLANT
PACK HYSTEROSCOPY LF (CUSTOM PROCEDURE TRAY) ×2 IMPLANT
PAD OB MATERNITY 4.3X12.25 (PERSONAL CARE ITEMS) ×2 IMPLANT
TOWEL OR 17X24 6PK STRL BLUE (TOWEL DISPOSABLE) ×4 IMPLANT

## 2012-10-11 NOTE — Anesthesia Preprocedure Evaluation (Addendum)
Anesthesia Evaluation  Patient identified by MRN, date of birth, ID band Patient awake    Reviewed: Allergy & Precautions, H&P , Patient's Chart, lab work & pertinent test results, reviewed documented beta blocker date and time   Airway Mallampati: II TM Distance: >3 FB Neck ROM: full    Dental no notable dental hx. (+) Edentulous Upper and Edentulous Lower   Pulmonary  breath sounds clear to auscultation  Pulmonary exam normal       Cardiovascular hypertension (3 meds; well controlled), On Medications Rhythm:regular Rate:Normal     Neuro/Psych    GI/Hepatic hiatal hernia, GERD-  ,  Endo/Other  Morbid obesity  Renal/GU      Musculoskeletal   Abdominal   Peds  Hematology   Anesthesia Other Findings   Reproductive/Obstetrics                          Anesthesia Physical Anesthesia Plan  ASA: III  Anesthesia Plan: General   Post-op Pain Management:    Induction: Intravenous  Airway Management Planned: LMA  Additional Equipment:   Intra-op Plan:   Post-operative Plan:   Informed Consent: I have reviewed the patients History and Physical, chart, labs and discussed the procedure including the risks, benefits and alternatives for the proposed anesthesia with the patient or authorized representative who has indicated his/her understanding and acceptance.   Dental Advisory Given  Plan Discussed with: CRNA and Surgeon  Anesthesia Plan Comments: (  Discussed  general anesthesia, including possible nausea, instrumentation of airway, sore throat,pulmonary aspiration, etc. I asked if the were any outstanding questions, or  concerns before we proceeded. )        Anesthesia Quick Evaluation

## 2012-10-11 NOTE — Anesthesia Postprocedure Evaluation (Signed)
Anesthesia Post Note  Patient: Melissa Murphy  Procedure(s) Performed: Procedure(s) (LRB): DILATATION & CURETTAGE/HYSTEROSCOPY WITH RESECTOCOPE (N/A)  Anesthesia type: General  Patient location: PACU  Post pain: Pain level controlled  Post assessment: Post-op Vital signs reviewed  Last Vitals:  Filed Vitals:   10/11/12 0845  BP: 105/88  Pulse: 61  Temp: 36.5 C  Resp: 16    Post vital signs: Reviewed  Level of consciousness: sedated  Complications: No apparent anesthesia complications

## 2012-10-11 NOTE — OR Nursing (Signed)
Interpreter Olegario Messier fariss) at bedside upon arrival to [employer]. Remains at bedside during pt pacu stay.

## 2012-10-11 NOTE — Transfer of Care (Signed)
Immediate Anesthesia Transfer of Care Note  Patient: Melissa Murphy  Procedure(s) Performed: Procedure(s) with comments: DILATATION & CURETTAGE/HYSTEROSCOPY WITH RESECTOCOPE (N/A) - pt is deaf; please contact daughter Purcell Nails [phone]  Patient Location: PACU  Anesthesia Type:General  Level of Consciousness: awake, alert  and oriented  Airway & Oxygen Therapy: Patient Spontanous Breathing and Patient connected to nasal cannula oxygen  Post-op Assessment: Report given to PACU RN and Post -op Vital signs reviewed and stable  Post vital signs: Reviewed and stable  Complications: No apparent anesthesia complications

## 2012-10-11 NOTE — Brief Op Note (Signed)
10/11/2012  8:31 AM  PATIENT:  Melissa Murphy  77 y.o. female  PRE-OPERATIVE DIAGNOSIS:  PMB cpt [phone]  POST-OPERATIVE DIAGNOSIS:  post menopausal bleeding  PROCEDURE:  Procedure(s) with comments: DILATATION & CURETTAGE/HYSTEROSCOPY WITH RESECTOCOPE (N/A) - pt is deaf; please contact daughter Purcell Nails [phone]  SURGEON:  Surgeon(s) and Role:    * Leslie Andrea, MD - Primary  PHYSICIAN ASSISTANT:   ASSISTANTS: none   ANESTHESIA:   general  EBL:     BLOOD ADMINISTERED:none  DRAINS: none   LOCAL MEDICATIONS USED:  LIDOCAINE   SPECIMEN:  Source of Specimen:  endometrial resection,endometrial currettings  DISPOSITION OF SPECIMEN:  PATHOLOGY  COUNTS:  YES  TOURNIQUET:  * No tourniquets in log *  DICTATION: .Other Dictation: Dictation Number [id number]  PLAN OF CARE: Discharge to home after PACU  PATIENT DISPOSITION:  PACU - hemodynamically stable.   Delay start of Pharmacological VTE agent (>24hrs) due to surgical blood loss or risk of bleeding: not applicable

## 2012-10-11 NOTE — Telephone Encounter (Signed)
LMTCB.  Will forward to [facility] for follow up.

## 2012-10-12 NOTE — Op Note (Signed)
[name], Melissa Murphy            ACCOUNT NO.:  [id number]  MEDICAL RECORD NO.:  [id number]  LOCATION:  [facility]                          FACILITY:  [facility]  PHYSICIAN:  Guy Sandifer. Henderson Cloud, M.D. DATE OF BIRTH:  [birth year]  DATE OF PROCEDURE:  10/11/2012 DATE OF DISCHARGE:  10/11/2012                              OPERATIVE REPORT   PREOPERATIVE DIAGNOSIS:  Postmenopausal bleeding.  POSTOPERATIVE DIAGNOSIS:  Postmenopausal bleeding.  PROCEDURE:  Hysteroscopy with resection of endometrial polyp, dilatation curettage.  SURGEON:  Guy Sandifer. Henderson Cloud, M.D.  ANESTHESIA:  General with LMA.  ESTIMATED BLOOD LOSS:  Less than 50 mL.  SPECIMENS: 1. Endometrial resection. 2. Endometrial curettings both to pathology.  I AND O'S:  Distending media 80 mL deficit.  INDICATIONS AND CONSENT:  This patient is a 77 year old female with postmenopausal bleeding.  Ultrasound suggestive of endometrial masses. Potential precancerous or cancerous nature these were discussed with the patient.  Hysteroscopy resection, dilation and curettage was discussed preoperatively.  Potential risks, complications were reviewed preoperatively including but not limited to, infection, organ damage, bleeding requiring transfusion of blood products with HIV and hepatitis acquisition, DVT, PE, pneumonia, uterine perforation, laparoscopy, laparotomy, recurrent polyps.  The patient is deaf and communications with both written notes and with an interpreter provided by the hospital.  She does have a history of a DVT.  She is given subcutaneous heparin preoperatively and SCD hose were used in the operating room as well.  All questions were answered and consent is signed on the chart.  FINDINGS:  There are at least 3 endometrial polypoid type masses 1 about 1 cm, the other about 2.5 cm, and a 3rd 1 about 1 cm on the upper endometrial cavity.  After these were removed, both fallopian tube ostia noted.  The remainder of the cavity was  normal.  PROCEDURE:  The patient was taken to the operating room.  She was identified, placed in dorsal supine position.  General anesthesia was induced via LMA.  She is placed in dorsal lithotomy position.  She was prepped.  Bladder straight catheterized and draped in sterile fashion. Time-out is undertaken.  Bivalve speculum was placed in the vagina and the anterior cervical lip was injected with 1% plain lidocaine.  It was then grasped with a single-tooth tenaculum.  Paracervical block was placed at 2, 4, 5, 7, 8, and 10 o'clock positions with approximately 20 mL of the same solution.  Cervix was gently progressively dilated. Resectoscope with a single right-angle wire loop was then placed in endocervical canal advanced under direct visualization with distending media.  The above findings were noted.  Using the resectoscope as well as graspers and sharp curettage, all the polypoid masses were removed.  Inspection reveals the cavity to be cleaned.  Good hemostasis was noted.  All instruments were removed. All counts were correct.  The patient is awake and taken to recovery room in stable condition.     Guy Sandifer Henderson Cloud, M.D.     JET/MEDQ  D:  10/11/2012  T:  10/11/2012  Job:  [id number]

## 2012-10-14 ENCOUNTER — Ambulatory Visit
Admission: RE | Admit: 2012-10-14 | Discharge: 2012-10-14 | Disposition: A | Discharge status: Home / Self Care | Payer: Medicare Other | Source: Ambulatory Visit | Attending: General Surgery | Admitting: General Surgery

## 2012-10-14 ENCOUNTER — Encounter (HOSPITAL_COMMUNITY): Payer: Self-pay | Admitting: Obstetrics and Gynecology

## 2012-10-14 ENCOUNTER — Other Ambulatory Visit (HOSPITAL_COMMUNITY): Payer: Medicare Other

## 2012-10-14 ENCOUNTER — Other Ambulatory Visit: Payer: Medicare Other

## 2012-10-16 ENCOUNTER — Telehealth: Payer: Self-pay | Admitting: *Deleted

## 2012-10-16 NOTE — Telephone Encounter (Signed)
SPOKE WITH PT'S DAUGHTER  PT C/O OF SNORING AND  NOT RESTING WELL WILL FORWARD TO DR  Eden Emms FOR REVIEW   PT WANTED TO KNOW IF WOULD BENEFIT FROM A SLEEP STUDY   TO R/O  SLEEP APNEA./CY

## 2012-10-16 NOTE — Telephone Encounter (Signed)
Probably but medical doctor would need to refer

## 2012-10-19 ENCOUNTER — Other Ambulatory Visit: Payer: Self-pay | Admitting: Physician Assistant

## 2012-10-22 NOTE — Telephone Encounter (Signed)
LMTCB ./CY 

## 2012-10-22 NOTE — Telephone Encounter (Signed)
PT'S DAUGHTER  AWARE  TO GET PMD TO REFER FOR SLEEP STUDY .Melissa Murphy

## 2012-10-22 NOTE — Telephone Encounter (Signed)
PT'S DAUGHTER AWARE TO HAVE PMD REFER  FOR SLEEP STUDY .Melissa Murphy

## 2012-10-24 ENCOUNTER — Telehealth (INDEPENDENT_AMBULATORY_CARE_PROVIDER_SITE_OTHER): Payer: Self-pay

## 2012-10-24 ENCOUNTER — Other Ambulatory Visit (INDEPENDENT_AMBULATORY_CARE_PROVIDER_SITE_OTHER): Payer: Self-pay

## 2012-10-24 ENCOUNTER — Telehealth: Payer: Self-pay | Admitting: Gastroenterology

## 2012-10-24 NOTE — Telephone Encounter (Signed)
Pt scheduled to see Dr. Arlyce Dice 11/01/12@10 :30am. Sarah to notify pt of appt date and time.

## 2012-10-24 NOTE — Telephone Encounter (Signed)
Daughter called back and given the message.  She understands she will be contacted by Dr. Marzetta Board office with the specifics of the Endo when it is scheduled.  Also daughter understands will need "referral" from her PCP for the sleep study, per her insurance rules.  She understands and will work on that now.

## 2012-10-24 NOTE — Telephone Encounter (Signed)
Message copied by Ivory Broad on Thu Oct 24, 2012  9:21 AM ------      Message from: Ernestene Mention      Created: Thu Oct 24, 2012  6:00 AM       I think she is OK from cardiology standpoint.            However, she is scheduled for a sleep study to assess sleep apnea. She also needs the upper endo.  If referral not done go ahead. Reschedule appt. With me after EGD done and after sleep study done.            hmi            ----- Message -----         From: Ivory Broad, RN         Sent: 10/23/2012   4:23 PM           To: Ernestene Mention, MD            It looks like this pt is coming back to see you next week to discuss surgery.   I got a note faxed from Dr Eden Emms (which is also in Epic).   I am not sure that it says she is clear but I will let you read it.  She has had an abd [place].  I do not see where the endo is set up with Dr Arlyce Dice.  Do I need to make that referral?                  Huntley Dec       ------

## 2012-10-24 NOTE — Telephone Encounter (Signed)
I called and left the pt's daughter a message to call me.  I need to cancel her mom's appointment for 3/18.  I will make a referral to Dr Arlyce Dice for endo and she will need to come in and see Dr Derrell Lolling after the endo and after she completes her sleep study work up.

## 2012-10-24 NOTE — Telephone Encounter (Signed)
I called to schedule the pt with Dr Arlyce Dice for endo and the nurse Bonita Quin gave me an appointment for her to come in 11/01/12 at 10:15.  They will talk about scheduling at that point.  I will notify the daughter.

## 2012-10-29 ENCOUNTER — Encounter (INDEPENDENT_AMBULATORY_CARE_PROVIDER_SITE_OTHER): Payer: Medicare Other | Admitting: General Surgery

## 2012-10-30 ENCOUNTER — Telehealth: Payer: Self-pay | Admitting: Internal Medicine

## 2012-10-30 NOTE — Telephone Encounter (Signed)
Last refill: 10/08/2012

## 2012-10-30 NOTE — Telephone Encounter (Signed)
Rx has been faxed and filed. Lana Alysandra Lobue MA

## 2012-11-01 ENCOUNTER — Encounter: Payer: Self-pay | Admitting: Gastroenterology

## 2012-11-01 ENCOUNTER — Ambulatory Visit (INDEPENDENT_AMBULATORY_CARE_PROVIDER_SITE_OTHER): Payer: Medicare Other | Admitting: Gastroenterology

## 2012-11-01 VITALS — BP 130/80 | HR 59 | Ht 63.0 in | Wt 210.0 lb

## 2012-11-01 DIAGNOSIS — K3189 Other diseases of stomach and duodenum: Secondary | ICD-10-CM

## 2012-11-01 DIAGNOSIS — K319 Disease of stomach and duodenum, unspecified: Secondary | ICD-10-CM

## 2012-11-01 NOTE — Patient Instructions (Addendum)
You have been scheduled for an endoscopy with propofol. Please follow written instructions given to you at your visit today. If you use inhalers (even only as needed), please bring them with you on the day of your procedure. 

## 2012-11-01 NOTE — Assessment & Plan Note (Signed)
Plan upper endoscopy prior to surgery

## 2012-11-01 NOTE — Progress Notes (Signed)
History of Present Illness: 77 year old white female with a gastric volvulus returning to schedule upper endoscopy. She has been evaluated by surgery who requested endoscopy prior to surgery to rule out any mucosal abnormalities.  GI complaints have not changed and consists of early satiety and postprandial fullness.    Past Medical History  Diagnosis Date  . GERD (gastroesophageal reflux disease)   . Hypertension   . Hyperlipidemia   . Glaucoma   . Urinary incontinence   . Edema   . Anemia   . Arthritis   . DVT (deep venous thrombosis)     in eye, and leg  . Osteoporosis   . Ulcer   . Hernia   . Deaf     Requires an interpreter  . Uterine polyp   . Hiatal hernia    Past Surgical History  Procedure Laterality Date  . Cataract extraction      both eyes  . Goiter removed      age 40  . Xanthoma tumor removed    . Refractive surgery    . Dilatation & currettage/hysteroscopy with resectocope N/A 10/11/2012    Procedure: DILATATION & CURETTAGE/HYSTEROSCOPY WITH RESECTOCOPE;  Surgeon: Leslie Andrea, MD;  Location: [facility];  Service: Gynecology;  Laterality: N/A;  pt is deaf; please contact daughter Purcell Nails [phone]   family history includes Cancer in her mother; Heart disease in her brother and father; Hernia in her brother; Migraines in her daughter; and Stroke in her mother.  There is no history of Esophageal cancer, and Rectal cancer, and Stomach cancer, . Current Outpatient Prescriptions  Medication Sig Dispense Refill  . aspirin 81 MG tablet Take 81 mg by mouth daily.      . cloNIDine (CATAPRES) 0.1 MG tablet TAKE 1 TABLET (0.1 MG TOTAL) BY MOUTH 2 (TWO) TIMES DAILY.  60 tablet  3  . dorzolamide-timolol (COSOPT) 22.3-6.8 MG/ML ophthalmic solution Place 1 drop into the right eye 2 (two) times daily.      . metoprolol (LOPRESSOR) 50 MG tablet 50 mg as needed. Takes one in the morning and one at night      . omeprazole (PRILOSEC) 20 MG capsule       . ramipril (ALTACE)  10 MG capsule TAKE 1 CAPSULE (10 MG TOTAL) BY MOUTH 2 (TWO) TIMES DAILY.  60 capsule  3  . simvastatin (ZOCOR) 20 MG tablet Take 20 mg by mouth every evening.      . tolterodine (DETROL) 2 MG tablet Take 2 mg by mouth 2 (two) times daily.       Marland Kitchen zolpidem (AMBIEN) 10 MG tablet Take 10 mg by mouth at bedtime as needed. For sleep       No current facility-administered medications for this visit.   Allergies as of 11/01/2012 - Review Complete 11/01/2012  Allergen Reaction Noted  . Avastin (bevacizumab) Nausea And Vomiting 10/04/2012  . Clindamycin/lincomycin Itching 09/18/2011  . Novocain (procaine)  05/02/2012  . Sulfa antibiotics  09/18/2011    reports that she has never smoked. She has never used smokeless tobacco. She reports that  drinks alcohol. She reports that she does not use illicit drugs.     Review of Systems: Pertinent positive and negative review of systems were noted in the above HPI section. All other review of systems were otherwise negative.  Vital signs were reviewed in today's medical record Physical Exam: General: Well developed , well nourished, no acute distress

## 2012-11-01 NOTE — Telephone Encounter (Signed)
Angela from [facility] in [place], [place] states pt is requesting an EARLY REFILL for Rx ZOLPIDEM today. Marylene Land would like to know if it's okay to fill Rx early?? Last refill date; 11/05/12. Please Advise. Thanks.

## 2012-11-09 ENCOUNTER — Telehealth: Payer: Self-pay

## 2012-11-09 DIAGNOSIS — G4710 Hypersomnia, unspecified: Secondary | ICD-10-CM

## 2012-11-09 NOTE — Telephone Encounter (Signed)
PATIENT WANTS REFERRAL TO HAVE SLEEP STUDY DONE, HEART SPECIALIST SAYS PCP DOOLITTLE NEEDS TO MAKE REFERRAL.  BEST NUMBER IS: [phone]

## 2012-11-10 NOTE — Telephone Encounter (Signed)
Referral sent for cardiology regarding sleep study

## 2012-11-11 NOTE — Telephone Encounter (Signed)
Called her to advise.  

## 2012-11-12 ENCOUNTER — Ambulatory Visit (AMBULATORY_SURGERY_CENTER): Payer: Medicare Other | Admitting: Gastroenterology

## 2012-11-12 ENCOUNTER — Encounter: Payer: Self-pay | Admitting: Gastroenterology

## 2012-11-12 VITALS — BP 149/74 | HR 50 | Temp 98.0°F | Resp 17 | Ht 63.0 in | Wt 210.0 lb

## 2012-11-12 DIAGNOSIS — K449 Diaphragmatic hernia without obstruction or gangrene: Secondary | ICD-10-CM | POA: Diagnosis not present

## 2012-11-12 MED ORDER — SODIUM CHLORIDE 0.9 % IV SOLN: 500.0000 mL | INTRAVENOUS | Status: DC

## 2012-11-12 NOTE — Progress Notes (Signed)
Patient did not experience any of the following events: a burn prior to discharge; a fall within the facility; wrong site/side/patient/procedure/implant event; or a hospital transfer or hospital admission upon discharge from the facility. (G8907) Patient did not have preoperative order for IV antibiotic SSI prophylaxis. (G8918)  

## 2012-11-12 NOTE — Patient Instructions (Addendum)

## 2012-11-12 NOTE — Op Note (Signed)
[facility] [address] [place], [address]   ENDOSCOPY PROCEDURE REPORT  PATIENT: Melissa Murphy, Melissa Murphy  MR#: [id number] BIRTHDATE: 08-Jul-1936 , 76  yrs. old GENDER: Female ENDOSCOPIST: Louis Meckel, MD REFERRED BY:  Claud Kelp, M.D.  Ellamae Sia, M.D. PROCEDURE DATE:  11/12/2012 PROCEDURE:  EGD, diagnostic ASA CLASS:     Class III INDICATIONS:  patient with intrathoracic stomach and gastric volvulus; preoperative evaluation. MEDICATIONS: MAC sedation, administered by CRNA, propofol (Diprivan) 150mg  IV, and Robinul 0.2 mg IV TOPICAL ANESTHETIC: Cetacaine Spray  DESCRIPTION OF PROCEDURE: After the risks benefits and alternatives of the procedure were thoroughly explained, informed consent was obtained.  The LB [id number]-[id number] endoscope was introduced through the mouth and advanced to the third portion of the duodenum. Without limitations.  The instrument was slowly withdrawn as the mucosa was fully examined.      There was a moderate stricture at the GE junction.  A 7 mm gastroscope passed through the stricture without resistance. There was a very large hiatal hernia.  The stomach was twisted upon itself.  It offered no obstruction to the passage of the endoscope. T.  Retroflexed views revealed no abnormalities.     The scope was then withdrawn from the patient and the procedure completed.  COMPLICATIONS: There were no complications. ENDOSCOPIC IMPRESSION: 1.   large hiatal hernia with gastric volvulus 2.   esophageal stricture  RECOMMENDATIONS: surgical repair of intrathoracic stomach Esophageal dilatation as needed ( patient is not complaining of dysphagia) REPEAT EXAM:  eSigned:  Louis Meckel, MD 11/12/2012 4:27 PM   CC:

## 2012-11-13 ENCOUNTER — Telehealth: Payer: Self-pay | Admitting: *Deleted

## 2012-11-13 DIAGNOSIS — Z1231 Encounter for screening mammogram for malignant neoplasm of breast: Secondary | ICD-10-CM | POA: Diagnosis not present

## 2012-11-13 NOTE — Telephone Encounter (Signed)
Number identifier, left message, follow-up  

## 2012-11-20 ENCOUNTER — Telehealth: Payer: Self-pay

## 2012-11-20 NOTE — Telephone Encounter (Signed)
[facility] has been sent the order. Patients daughter wants appt, not results, advised her order sent to them, she will call  Phone: [phone]

## 2012-11-20 NOTE — Telephone Encounter (Signed)
Patients daughter Rosalita Chessman is calling to see if sleep study results were in please call at [phone]

## 2012-11-28 ENCOUNTER — Institutional Professional Consult (permissible substitution): Payer: Medicare Other | Admitting: Neurology

## 2012-12-04 DIAGNOSIS — M949 Disorder of cartilage, unspecified: Secondary | ICD-10-CM | POA: Diagnosis not present

## 2012-12-04 DIAGNOSIS — Z13820 Encounter for screening for osteoporosis: Secondary | ICD-10-CM | POA: Diagnosis not present

## 2012-12-04 DIAGNOSIS — M899 Disorder of bone, unspecified: Secondary | ICD-10-CM | POA: Diagnosis not present

## 2012-12-12 ENCOUNTER — Institutional Professional Consult (permissible substitution): Payer: Medicare Other | Admitting: Neurology

## 2012-12-17 ENCOUNTER — Other Ambulatory Visit: Payer: Self-pay | Admitting: *Deleted

## 2012-12-17 ENCOUNTER — Institutional Professional Consult (permissible substitution): Payer: Medicare Other | Admitting: Neurology

## 2012-12-17 ENCOUNTER — Ambulatory Visit (INDEPENDENT_AMBULATORY_CARE_PROVIDER_SITE_OTHER): Payer: Medicare Other | Admitting: Neurology

## 2012-12-17 ENCOUNTER — Encounter: Payer: Self-pay | Admitting: Neurology

## 2012-12-17 VITALS — BP 137/76 | HR 51 | Temp 98.5°F | Ht 64.0 in | Wt 211.0 lb

## 2012-12-17 DIAGNOSIS — G4733 Obstructive sleep apnea (adult) (pediatric): Secondary | ICD-10-CM | POA: Diagnosis not present

## 2012-12-17 NOTE — Progress Notes (Addendum)
Subjective:    Patient ID: Melissa Murphy is a 77 y.o. female.  HPI Huston Foley, MD, PhD [facility] [address] [address] [place], [place] [address]  Dear Dr. Merla Riches,  I saw your patient, Melissa Murphy, upon your kind request in my neurologic clinic today for initial consultation of her sleep disorder, concern for obstructive sleep apnea. The patient is accompanied by an interpreter, Tresa Endo, today. As you know, [name] is a very pleasant 77 year old right-handed hearing impaired woman with a underlying medical history of GERD, HTN, HLP, glaucoma, DVT, and venous insufficiency, osteoporosis, hernia and deafness who has been known to snore for quite some time. Per children, her snoring is loud, but she is unsure of witnessed apneas. Her ESS is 1/24 today. She was diagnosed with stomach herniation and torsion of stomach in November 2013 and needs surgery, but because of her loud snoring and obesity she will need to be ruled out for OSA per cardiology or her surgeon.  Her bedtime as reported to be around 2-3 AM and her typical wake time is usually around 10-11 AM. She used to work night shift. She does wake up fairly well rested in the morning and no trouble falling asleep, but does take ambien. It takes her an average 10-20 minutes to fall asleep. She takes [place] and has done so for many years. She takes 5 mg nightly. She also has middle of the night awakenings, on an average 2-3/nightand she goes to the bathroom 1-3 times usually.  She denies any recurrent morning headaches. She denies cataplexy, parasomnias, sleep paralysis, hypnagogic or hypnopompic hallucinations and sleep attacks. She has never fallen asleep at the wheel. She has never had a sleep study. She denies frank restless leg symptoms and is not known to kick in her sleep. Never had a TIA or a Stroke, and denies one sided weakness, numbness, tingling, slurring of speech or droopy face and  denies recurrent headaches. Her brother has OSA and is on CPAP.  Her Past Medical History Is Significant For: Past Medical History  Diagnosis Date  . GERD (gastroesophageal reflux disease)   . Hypertension   . Hyperlipidemia   . Glaucoma   . Urinary incontinence   . Edema   . Anemia   . Arthritis   . DVT (deep venous thrombosis)     in eye, and leg  . Osteoporosis   . Ulcer   . Hernia   . Deaf     Requires an interpreter  . Uterine polyp   . Hiatal hernia     Her Past Surgical History Is Significant For: Past Surgical History  Procedure Laterality Date  . Cataract extraction      both eyes  . Goiter removed      age 74  . Xanthoma tumor removed    . Refractive surgery    . Dilatation & currettage/hysteroscopy with resectocope N/A 10/11/2012    Procedure: DILATATION & CURETTAGE/HYSTEROSCOPY WITH RESECTOCOPE;  Surgeon: Leslie Andrea, MD;  Location: [facility];  Service: Gynecology;  Laterality: N/A;  pt is deaf; please contact daughter Purcell Nails [phone]    Her Family History Is Significant For: Family History  Problem Relation Age of Onset  . Esophageal cancer Neg Hx   . Rectal cancer Neg Hx   . Stomach cancer Neg Hx   . Cancer Mother     colon  . Stroke Mother   . Heart disease Father     38  yrs old  . Heart disease Brother     MI,   . Hernia Brother   . Migraines Daughter     Her Social History Is Significant For: History   Social History  . Marital Status: Married    Spouse Name: N/A    Number of Children: 3  . Years of Education: Grad    Occupational History  . Teacher- Retired    Social History Main Topics  . Smoking status: Never Smoker   . Smokeless tobacco: Never Used  . Alcohol Use: Yes     Comment: occasionally  . Drug Use: No  . Sexually Active: Not on file   Other Topics Concern  . Not on file   Social History Narrative  . No narrative on file    Her Allergies Are:  Allergies  Allergen Reactions  . Avastin (Bevacizumab)  Nausea And Vomiting  . Clindamycin/Lincomycin Itching  . Novocain (Procaine)     seizures  . Sulfa Antibiotics     fever  :   Her Current Medications Are:  Outpatient Encounter Prescriptions as of 12/17/2012  Medication Sig Dispense Refill  . aspirin 81 MG tablet Take 81 mg by mouth daily.      . cloNIDine (CATAPRES) 0.1 MG tablet TAKE 1 TABLET (0.1 MG TOTAL) BY MOUTH 2 (TWO) TIMES DAILY.  60 tablet  3  . dorzolamide-timolol (COSOPT) 22.3-6.8 MG/ML ophthalmic solution Place 1 drop into the right eye 2 (two) times daily.      . metoprolol (LOPRESSOR) 50 MG tablet 50 mg as needed. Takes one in the morning and one at night      . omeprazole (PRILOSEC) 20 MG capsule       . ramipril (ALTACE) 10 MG capsule TAKE 1 CAPSULE (10 MG TOTAL) BY MOUTH 2 (TWO) TIMES DAILY.  60 capsule  3  . simvastatin (ZOCOR) 20 MG tablet Take 20 mg by mouth every evening.      . tolterodine (DETROL) 2 MG tablet Take 2 mg by mouth 2 (two) times daily.       Marland Kitchen zolpidem (AMBIEN) 10 MG tablet Take 10 mg by mouth at bedtime as needed. For sleep      . [DISCONTINUED] Hydrocodone-Acetaminophen 5-300 MG TABS        No facility-administered encounter medications on file as of 12/17/2012.  :  Review of Systems  HENT:       Hearing loss,trouble swalling  Respiratory:       Snoring  Gastrointestinal: Positive for diarrhea and constipation.       Incontinence   Endocrine:       Feeling could  Musculoskeletal: Positive for joint swelling.  Hematological:       Anemia,easy bruising.    Objective:  Neurologic Exam  Physical Exam Physical Examination:   Filed Vitals:   12/17/12 1113  BP: 137/76  Pulse: 51  Temp: 98.5 F (36.9 C)    General Examination: The patient is a very pleasant 77 y.o. female in no acute distress. She appears well-developed and well-nourished and well groomed.   HEENT: Normocephalic, atraumatic, pupils are equal, round and reactive to light and accommodation. Funduscopic exam is normal  with sharp disc margins noted. Extraocular tracking is good without limitation to gaze excursion or nystagmus noted. Normal smooth pursuit is noted. Tympanic membranes are clear bilaterally. Face is symmetric with normal facial animation and normal facial sensation. She is deaf and mute. There is no lip, neck or jaw tremor. Neck is  supple with full range of motion. There are no carotid bruits on auscultation. Oropharynx exam reveals: adequate dental hygiene and moderate airway crowding, due to redundant soft palate. Moderate mouth dryness is noted. Mallampati is class III. Neck size is 15.5 inches. Tongue protrudes centrally and palate elevates symmetrically. Tonsils are absent.   Chest: Clear to auscultation without wheezing, rhonchi or crackles noted.  Heart: S1+S2+0, regular and normal without murmurs, rubs or gallops noted.   Abdomen: Soft, non-tender and non-distended with normal bowel sounds appreciated on auscultation.  Extremities: There is 3+ pitting edema in the distal lower extremities bilaterally. Chronic venous stasis changes are seen.  Skin: Warm and dry without trophic changes noted. There are spider veins.  Musculoskeletal: exam reveals no obvious joint deformities, tenderness or joint swelling or erythema.   Neurologically:  Mental status: The patient is awake, alert and oriented in all 4 spheres. Her memory, attention, language and knowledge are appropriate. There is no aphasia, agnosia, apraxia or anomia. Speech is clear with normal prosody and enunciation. Thought process is linear. Mood is congruent and affect is normal.  Cranial nerves are as described above under HEENT exam. In addition, shoulder shrug is normal with equal shoulder height noted. Motor exam: Normal bulk, strength and tone is noted. There is no drift, tremor or rebound. Romberg is negative. Reflexes are 2+ throughout. Toes are downgoing bilaterally. Fine motor skills are intact with normal finger taps, normal  hand movements, normal rapid alternating patting, normal foot taps and normal foot agility.  Cerebellar testing shows no dysmetria or intention tremor on finger to nose testing. Heel to shin is unremarkable bilaterally. There is no truncal or gait ataxia.  Sensory exam is intact to light touch, pinprick, vibration, temperature sense and proprioception in the upper and lower extremities.  Gait, station and balance are unremarkable. No veering to one side is noted. No leaning to one side is noted. Posture is age-appropriate and stance is narrow based. No problems turning are noted. She turns in 3 steps. Tandem walk is unremarkable. Intact toe and heel stance is noted.               Assessment and Plan:   Assessment and Plan:  In summary, Kilah Drahos is a very pleasant 77 y.o.-year old female with a complex medical history, who has a history and exam concerning for OSA and she has a FHx of OSA.  I had a long chat with the patient about my findings and the diagnosis of OSA, the prognosis and treatment options. We talked about medical treatments and non-pharmacological approaches. We talked about maintaining a healthy lifestyle in general. I encouraged the patient to eat healthy, exercise daily and keep well hydrated, to keep a scheduled bedtime and wake time routine, to not skip any meal. As far as further diagnostic testing is concerned, I suggested the following today: sleep study, potential split study. We talked about surgical and non-surgical treatments, including CPAP, which I explained to her as well as the sleep test procedure.  I answered all her questions today and the patient was in agreement with the above outlined plan. I would like to see the patient back after the sleep study is completed.    Thank you very much for allowing me to participate in the care of this nice patient. If I can be of any further assistance to you please do not hesitate to call me at  [phone].  Sincerely,   Huston Foley, MD, PhD

## 2012-12-17 NOTE — Patient Instructions (Signed)
Based on your symptoms and your exam I believe you are at risk for obstructive sleep apnea or OSA, and I think we should proceed with a sleep study to determine whether you do or do not have OSA and how severe it is. If you have more than mild OSA, I want you to consider treatment with CPAP. Please remember, the risks and ramifications of moderate to severe obstructive sleep apnea or OSA are: Cardiovascular disease, including congestive heart failure, stroke, difficult to control hypertension, arrhythmias, and even type 2 diabetes has been linked to untreated OSA. Sleep apnea causes disruption of sleep and sleep deprivation in most cases, which, in turn, can cause recurrent headaches, problems with memory, mood, concentration, focus, and vigilance. Most people with untreated sleep apnea report excessive daytime sleepiness, which can affect their ability to drive. Please do not drive if you feel sleepy.

## 2012-12-25 ENCOUNTER — Ambulatory Visit: Payer: Medicare Other | Admitting: Neurology

## 2012-12-25 VITALS — BP 155/86 | Ht 64.0 in | Wt 211.0 lb

## 2012-12-25 DIAGNOSIS — G4720 Circadian rhythm sleep disorder, unspecified type: Secondary | ICD-10-CM

## 2012-12-25 DIAGNOSIS — G4733 Obstructive sleep apnea (adult) (pediatric): Secondary | ICD-10-CM

## 2012-12-26 DIAGNOSIS — G4720 Circadian rhythm sleep disorder, unspecified type: Secondary | ICD-10-CM | POA: Diagnosis not present

## 2012-12-26 DIAGNOSIS — G4733 Obstructive sleep apnea (adult) (pediatric): Secondary | ICD-10-CM | POA: Diagnosis not present

## 2013-01-10 ENCOUNTER — Telehealth: Payer: Self-pay | Admitting: Neurology

## 2013-01-10 ENCOUNTER — Other Ambulatory Visit: Payer: Self-pay | Admitting: *Deleted

## 2013-01-10 ENCOUNTER — Telehealth: Payer: Self-pay

## 2013-01-10 ENCOUNTER — Encounter: Payer: Self-pay | Admitting: *Deleted

## 2013-01-10 DIAGNOSIS — G4733 Obstructive sleep apnea (adult) (pediatric): Secondary | ICD-10-CM | POA: Insufficient documentation

## 2013-01-10 NOTE — Telephone Encounter (Signed)
Please call and notify the patient that the recent sleep study did confirm the diagnosis of obstructive sleep apnea and that I recommend treatment for this in the form of CPAP. This will require a repeat sleep study for proper titration and mask fitting. Please explain to patient and arrange for a CPAP titration study. I have placed an order in the chart. Thanks. Pls note, patient is hearing impaired. We were not able to split her study as her more pronounce respiratory symptoms occurred late in the study. Oxygen saturation went down to 77%, which is significant.   Once you have spoken to patient and arranged for study, please close this encounter.  Huston Foley, MD, PhD [facility])

## 2013-01-10 NOTE — Telephone Encounter (Signed)
Called patient to discuss sleep study results from  12/26/2012.  Discussed findings, recommendations and follow up care.  Patient understood well and all questions were answered.  SPOKE TO DAUGHTER SUZIE WHO INTERPRETED FOR PATIENT.    Melissa Murphy explains that they are awaiting surgical clearance for a hernia which is worsening.  CPAP Titration study scheduled for 01/15/2013 at 9:30 PM.  Pt may arrive at 8:00 PM but is aware that setup will not start until 9:30 PM.  Special arrangements were made last time to allow patient to sleep later, this may be beneficial if insomnia is an issue.  Arrangements will be made just in case.  Patient will sign release of records form to send results to Surgeon and Cardiologist for clearance purposes.  SLEEP STUDY WILL BE SCORED AND READ ASAP TO ALLOW FOR QUICKEST PROCESSING FOR THIS PATIENT.  A copy of sleep study report will be mailed to patient with a letter providing follow up details of our call. -sh

## 2013-01-10 NOTE — Progress Notes (Signed)
See media tab for full report  

## 2013-01-10 NOTE — Telephone Encounter (Signed)
Patient needs a referral to [facility]. Patient's insurance will not cover without the referral. Patient may also come in for an OV on Monday with Dr Merla Riches. Patient uses sign language and has an interpreter. [phone]

## 2013-01-13 ENCOUNTER — Encounter (INDEPENDENT_AMBULATORY_CARE_PROVIDER_SITE_OTHER): Payer: Medicare Other | Admitting: Emergency Medicine

## 2013-01-13 ENCOUNTER — Ambulatory Visit: Payer: Medicare Other | Admitting: Emergency Medicine

## 2013-01-13 ENCOUNTER — Ambulatory Visit: Payer: Medicare Other

## 2013-01-13 VITALS — BP 136/84 | HR 56 | Temp 98.5°F | Resp 16 | Ht 65.0 in | Wt 209.4 lb

## 2013-01-13 DIAGNOSIS — S8000XA Contusion of unspecified knee, initial encounter: Secondary | ICD-10-CM

## 2013-01-13 DIAGNOSIS — S8001XA Contusion of right knee, initial encounter: Secondary | ICD-10-CM

## 2013-01-13 DIAGNOSIS — R071 Chest pain on breathing: Secondary | ICD-10-CM

## 2013-01-13 DIAGNOSIS — R0789 Other chest pain: Secondary | ICD-10-CM

## 2013-01-13 MED ORDER — HYDROCODONE-ACETAMINOPHEN 5-325 MG PO TABS: 1.0000 | ORAL_TABLET | ORAL | Status: DC | PRN

## 2013-01-13 MED ORDER — MELOXICAM 15 MG PO TABS: 15.0000 mg | ORAL_TABLET | Freq: Every day | ORAL | Status: DC

## 2013-01-13 NOTE — Progress Notes (Signed)
[facility] [address] [place] [address] [phone]  Date:  01/13/2013   Name:  Melissa Murphy   DOB:  Mar 28, 1936   MRN:  [id number]  PCP:  Melissa Pearson, MD    Chief Complaint: No chief complaint on file.   History of Present Illness:  Melissa Murphy is a 77 y.o. very pleasant female patient who presents with the following:    Patient Active Problem List   Diagnosis Date Noted  . Obstructive sleep apnea (adult) (pediatric) 01/10/2013  . Uterine polyp   . Unspecified chronic bronchitis 10/02/2012  . 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 Early satiety 09/10/2012  . Bradycardia 08/19/2012  . Hypertension 04/30/2012  . Edema 09/18/2011  . Leg pain, bilateral 09/18/2011  . Stool color abnormal 09/18/2011  . Anemia 09/18/2011    Past Medical History  Diagnosis Date  . GERD (gastroesophageal reflux disease)   . Hypertension   . Hyperlipidemia   . Glaucoma   . Urinary incontinence   . Edema   . Anemia   . Arthritis   . DVT (deep venous thrombosis)     in Murphy, and leg  . Osteoporosis   . Ulcer   . Hernia   . Deaf     Requires an interpreter  . Uterine polyp   . Hiatal hernia   . Sleep apnea     Past Surgical History  Procedure Laterality Date  . Cataract extraction      both eyes  . Goiter removed      age 58  . Xanthoma tumor removed    . Refractive surgery    . Dilatation & currettage/hysteroscopy with resectocope N/A 10/11/2012    Procedure: DILATATION & CURETTAGE/HYSTEROSCOPY WITH RESECTOCOPE;  Surgeon: Melissa Andrea, MD;  Location: [facility];  Service: Gynecology;  Laterality: N/A;  pt is deaf; please contact daughter Melissa Murphy [phone]  . Murphy surgery    . Hernia repair      History  Substance Use Topics  . Smoking status: Never Smoker   . Smokeless tobacco: Never Used  . Alcohol Use: Yes     Comment: occasionally    Family History  Problem Relation Age of Onset  . Esophageal cancer Neg Hx   .  Rectal cancer Neg Hx   . Stomach cancer Neg Hx   . Cancer Mother     colon  . Stroke Mother   . Heart disease Father     51 yrs old  . Heart disease Brother     MI,   . Hernia Brother   . Migraines Daughter   . Heart disease Paternal Grandmother     Allergies  Allergen Reactions  . Avastin (Bevacizumab) Nausea And Vomiting  . Clindamycin/Lincomycin Itching  . Novocain (Procaine)     seizures  . Sulfa Antibiotics     fever    Medication list has been reviewed and updated.  Current Outpatient Prescriptions on File Prior to Visit  Medication Sig Dispense Refill  . aspirin 81 MG tablet Take 81 mg by mouth daily.      . cloNIDine (CATAPRES) 0.1 MG tablet TAKE 1 TABLET (0.1 MG TOTAL) BY MOUTH 2 (TWO) TIMES DAILY.  60 tablet  3  . dorzolamide-timolol (COSOPT) 22.3-6.8 MG/ML ophthalmic solution Place 1 drop into the right Murphy 2 (two) times daily.      . metoprolol (LOPRESSOR) 50 MG tablet 50 mg daily.       Marland Kitchen  omeprazole (PRILOSEC) 20 MG capsule as needed.       . ramipril (ALTACE) 10 MG capsule TAKE 1 CAPSULE (10 MG TOTAL) BY MOUTH 2 (TWO) TIMES DAILY.  60 capsule  3  . simvastatin (ZOCOR) 20 MG tablet Take 20 mg by mouth every evening.      . tolterodine (DETROL) 2 MG tablet Take 2 mg by mouth 2 (two) times daily.       Marland Kitchen zolpidem (AMBIEN) 10 MG tablet Take 10 mg by mouth at bedtime as needed. For sleep       No current facility-administered medications on file prior to visit.    Review of Systems:    Physical Examination: There were no vitals filed for this visit. There were no vitals filed for this visit. There is no weight on file to calculate BMI. Ideal Body Weight:      Assessment and Plan:  Signed,  Melissa Odor, MD

## 2013-01-13 NOTE — Progress Notes (Signed)
[facility] [address], [place] [address] [phone]  Date:  01/13/2013   Name:  Melissa Murphy   DOB:  1935-10-25   MRN:  [id number]  PCP:  Tonye Pearson, MD    Chief Complaint: Flank Pain   History of Present Illness:  Melissa Murphy is a 77 y.o. very pleasant female patient who presents with the following:  Ill two weeks ago with nausea and vomiting.  She became acutely dizzy following standing up and passed out.  Hit her head and had no LOC.  Hit her right lateral chest wall and has persistent pain in lateral chest wall.  No crepitus or hemoptysis.  No shortness of breath pain is pleuritic and affected by coughing or sneezing.  No nausea or vomiting.  That pain has persisted.  Also injured her right knee when she landed on it and has persistent pain and swelling.  Pain increases with ambulation.  No improvement with over the counter medications or other home remedies. Denies other complaint or health concern today.   Patient Active Problem List   Diagnosis Date Noted  . Obstructive sleep apnea (adult) (pediatric) 01/10/2013  . Uterine polyp   . Unspecified chronic bronchitis 10/02/2012  . 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 Early satiety 09/10/2012  . Bradycardia 08/19/2012  . Hypertension 04/30/2012  . Edema 09/18/2011  . Leg pain, bilateral 09/18/2011  . Stool color abnormal 09/18/2011  . Anemia 09/18/2011    Past Medical History  Diagnosis Date  . GERD (gastroesophageal reflux disease)   . Hypertension   . Hyperlipidemia   . Glaucoma   . Urinary incontinence   . Edema   . Anemia   . Arthritis   . DVT (deep venous thrombosis)     in eye, and leg  . Osteoporosis   . Ulcer   . Hernia   . Deaf     Requires an interpreter  . Uterine polyp   . Hiatal hernia   . Sleep apnea     Past Surgical History  Procedure Laterality Date  . Cataract extraction      both eyes  . Goiter removed      age 74  . Xanthoma tumor  removed    . Refractive surgery    . Dilatation & currettage/hysteroscopy with resectocope N/A 10/11/2012    Procedure: DILATATION & CURETTAGE/HYSTEROSCOPY WITH RESECTOCOPE;  Surgeon: Leslie Andrea, MD;  Location: [facility];  Service: Gynecology;  Laterality: N/A;  pt is deaf; please contact daughter Purcell Nails [phone]  . Eye surgery    . Hernia repair      History  Substance Use Topics  . Smoking status: Never Smoker   . Smokeless tobacco: Never Used  . Alcohol Use: Yes     Comment: occasionally    Family History  Problem Relation Age of Onset  . Esophageal cancer Neg Hx   . Rectal cancer Neg Hx   . Stomach cancer Neg Hx   . Cancer Mother     colon  . Stroke Mother   . Heart disease Father     22 yrs old  . Heart disease Brother     MI,   . Hernia Brother   . Migraines Daughter   . Heart disease Paternal Grandmother     Allergies  Allergen Reactions  . Avastin (Bevacizumab) Nausea And Vomiting  . Clindamycin/Lincomycin Itching  . Novocain (Procaine)     seizures  . Sulfa Antibiotics  fever    Medication list has been reviewed and updated.  Current Outpatient Prescriptions on File Prior to Visit  Medication Sig Dispense Refill  . aspirin 81 MG tablet Take 81 mg by mouth daily.      . cloNIDine (CATAPRES) 0.1 MG tablet TAKE 1 TABLET (0.1 MG TOTAL) BY MOUTH 2 (TWO) TIMES DAILY.  60 tablet  3  . dorzolamide-timolol (COSOPT) 22.3-6.8 MG/ML ophthalmic solution Place 1 drop into the right eye 2 (two) times daily.      . metoprolol (LOPRESSOR) 50 MG tablet 50 mg daily.       Marland Kitchen omeprazole (PRILOSEC) 20 MG capsule as needed.       . ramipril (ALTACE) 10 MG capsule TAKE 1 CAPSULE (10 MG TOTAL) BY MOUTH 2 (TWO) TIMES DAILY.  60 capsule  3  . simvastatin (ZOCOR) 20 MG tablet Take 20 mg by mouth every evening.      . tolterodine (DETROL) 2 MG tablet Take 2 mg by mouth 2 (two) times daily.       Marland Kitchen zolpidem (AMBIEN) 10 MG tablet Take 10 mg by mouth at bedtime as needed.  For sleep       No current facility-administered medications on file prior to visit.    Review of Systems:  As per HPI, otherwise negative.    Physical Examination: Filed Vitals:   01/13/13 1633  BP: 136/84  Pulse: 56  Temp: 98.5 F (36.9 C)  Resp: 16   Filed Vitals:   01/13/13 1633  Height: 5\' 5"  (1.651 m)  Weight: 209 lb 6.4 oz (94.983 kg)   Body mass index is 34.85 kg/(m^2). Ideal Body Weight: Weight in (lb) to have BMI = 25: 149.9  GEN: WDWN, NAD, Non-toxic, A & O x 3 HEENT: Atraumatic, Normocephalic. Neck supple. No masses, No LAD. Ears and Nose: No external deformity. CV: RRR, No M/G/R. No JVD. No thrill. No extra heart sounds. PULM: CTA B, no wheezes, crackles, rhonchi. No retractions. No resp. distress. No accessory muscle use. Chest tenderness laterally.  No crepitus or flail.  No ecchymosis ABD: S, NT, ND, +BS. No rebound. No HSM. EXTR: No c/c/e  Right knee tender and swollen.  No ecchymosis or deformity.  Pain with flexion NEURO Normal gait.  PSYCH: Normally interactive. Conversant. Not depressed or anxious appearing.  Calm demeanor.    Assessment and Plan: DJD knee Chest wall contusion mobic vicodin Follow up as needed  Signed,  Phillips Odor, MD   [facility] reading (PRIMARY) by  Dr. Dareen Piano.  Knee severe DJD.  [facility] reading (PRIMARY) by  Dr. Dareen Piano.  CHEST  NAD.

## 2013-01-13 NOTE — Patient Instructions (Addendum)
Rib Fracture  Your caregiver has diagnosed you as having a rib fracture (a break). This can occur by a blow to the chest, by a fall against a hard object, or by violent coughing or sneezing. There may be one or many breaks. Rib fractures may heal on their own within 3 to 8 weeks. The longer healing period is usually associated with a continued cough or other aggravating activities.  HOME CARE INSTRUCTIONS    Avoid strenuous activity. Be careful during activities and avoid bumping the injured rib. Activities that cause pain pull on the fracture site(s) and are best avoided if possible.   Eat a normal, well-balanced diet. Drink plenty of fluids to avoid constipation.   Take deep breaths several times a day to keep lungs free of infection. Try to cough several times a day, splinting the injured area with a pillow. This will help prevent pneumonia.   Do not wear a rib belt or binder. These restrict breathing which can lead to pneumonia.   Only take over-the-counter or prescription medicines for pain, discomfort, or fever as directed by your caregiver.  SEEK MEDICAL CARE IF:   You develop a continual cough, associated with thick or bloody sputum.  SEEK IMMEDIATE MEDICAL CARE IF:    You have a fever.   You have difficulty breathing.   You have nausea (feeling sick to your stomach), vomiting, or abdominal (belly) pain.   You have worsening pain, not controlled with medications.  Document Released: 07/31/2005 Document Revised: 10/23/2011 Document Reviewed: 01/02/2007  Exit[employer] Patient Information 2014 [employer].

## 2013-01-14 NOTE — Telephone Encounter (Signed)
Patient was seen yesterday this was adressed

## 2013-01-15 ENCOUNTER — Other Ambulatory Visit: Payer: Self-pay | Admitting: Physician Assistant

## 2013-01-15 ENCOUNTER — Ambulatory Visit (INDEPENDENT_AMBULATORY_CARE_PROVIDER_SITE_OTHER): Payer: Medicare Other | Admitting: Neurology

## 2013-01-15 VITALS — BP 139/74 | Ht 64.0 in | Wt 211.0 lb

## 2013-01-15 DIAGNOSIS — G4733 Obstructive sleep apnea (adult) (pediatric): Secondary | ICD-10-CM

## 2013-01-15 NOTE — Telephone Encounter (Signed)
Verified w/daughter that pt is taking just one metoprolol 50 mg BID. Also instr'd daughter to advise pt that she is due for BP f/up/labs and she should discuss w/provider when she comes in for broken rib check up.

## 2013-01-21 ENCOUNTER — Other Ambulatory Visit: Payer: Self-pay | Admitting: Neurology

## 2013-01-21 DIAGNOSIS — H40059 Ocular hypertension, unspecified eye: Secondary | ICD-10-CM | POA: Diagnosis not present

## 2013-01-21 DIAGNOSIS — H3530 Unspecified macular degeneration: Secondary | ICD-10-CM | POA: Diagnosis not present

## 2013-01-21 DIAGNOSIS — Z961 Presence of intraocular lens: Secondary | ICD-10-CM | POA: Diagnosis not present

## 2013-01-21 DIAGNOSIS — G4733 Obstructive sleep apnea (adult) (pediatric): Secondary | ICD-10-CM

## 2013-01-21 NOTE — Progress Notes (Signed)
This encounter was created in error - please disregard.

## 2013-01-22 ENCOUNTER — Encounter: Payer: Self-pay | Admitting: *Deleted

## 2013-01-22 ENCOUNTER — Telehealth: Payer: Self-pay | Admitting: *Deleted

## 2013-01-22 NOTE — Telephone Encounter (Signed)
Left message for patient regarding sleep study results, asked patient to call me back to discuss results and have questions answered.  Explained that a copy of the sleep study was sent to referring physician and copy of study is coming to them in the mail.  Mentioned that orders had been sent to [facility] to start CPAP therapy, requested pt call back with a local mailing address so I can mail the report out to her.  (phone calls are handled through her daughter Romana Juniper).

## 2013-01-22 NOTE — Progress Notes (Signed)
See media tab for full report  

## 2013-02-02 ENCOUNTER — Other Ambulatory Visit: Payer: Self-pay | Admitting: Physician Assistant

## 2013-02-16 ENCOUNTER — Other Ambulatory Visit: Payer: Self-pay | Admitting: Emergency Medicine

## 2013-02-17 ENCOUNTER — Telehealth: Payer: Self-pay | Admitting: Internal Medicine

## 2013-02-17 NOTE — Telephone Encounter (Signed)
We received a communication from the patient's pharmacy about a denial and request for prior authorization:    Pharmacy benefit Patient insurance ID#   [employer] [place] [id number]     Medication verified- name/dose/sig as below from previous order.     medication reason   Zolpidem (AMBIEN) 10 mg Tablet new prior-authorization needed     Please respond if you want to proceed with prior authorization or change medication.     Jenene Slicker, LVN

## 2013-02-17 NOTE — Telephone Encounter (Signed)
Start PA, thanks!

## 2013-02-18 NOTE — Telephone Encounter (Signed)
PA completed. please fax.

## 2013-02-18 NOTE — Telephone Encounter (Signed)
PA completed; please review and sign.  Thank you.    Jenene Slicker, LVN

## 2013-02-18 NOTE — Telephone Encounter (Signed)
Faxed and filed Rx.    Terrye Dombrosky, LVN

## 2013-02-20 DIAGNOSIS — M949 Disorder of cartilage, unspecified: Secondary | ICD-10-CM | POA: Diagnosis not present

## 2013-02-20 DIAGNOSIS — M899 Disorder of bone, unspecified: Secondary | ICD-10-CM | POA: Diagnosis not present

## 2013-02-21 ENCOUNTER — Other Ambulatory Visit: Payer: Self-pay | Admitting: Internal Medicine

## 2013-02-21 MED ORDER — RAMIPRIL 10 MG CAPSULE
1.0000 | ORAL_CAPSULE | Freq: Every day | ORAL | Status: DC
Start: 2013-02-21 — End: 2013-03-05

## 2013-02-21 NOTE — Telephone Encounter (Signed)
Pt is requesting a refill for the marked above. She wanted to let you know that she will be back in town in the Fall.

## 2013-02-21 NOTE — Telephone Encounter (Signed)
Given for 1 week only  She has not had labs for 2 years  She can see a physician in her local area    Thank you

## 2013-02-24 NOTE — Telephone Encounter (Signed)
LVM for pt to return call.  Melinda [name] MOSC II

## 2013-02-25 NOTE — Telephone Encounter (Signed)
Pt notified of MD's message, and pt verbalized understanding. Michele Jones MOSC II

## 2013-03-04 NOTE — Telephone Encounter (Addendum)
Pt calling thru sign language interpreter stating that she called her insurance regarding the below PA, and they informed pt that they NEVER received the PA. Pt is requesting someone from our office call the below number for a faster response. Please Advise. Thanks.     Phone# [phone]    FYI: Please call pt once someone calls them.

## 2013-03-04 NOTE — Telephone Encounter (Signed)
Spoke with [employer] regarding PA below, PA has been approved from 01/02/13-03/04/14 and pharmacy has been notified of PA approval.  Pharmacist states that he will contact patient once Rx is ready for pick up.    Jenene Slicker, LVN

## 2013-03-05 ENCOUNTER — Other Ambulatory Visit: Payer: Self-pay | Admitting: Internal Medicine

## 2013-03-05 ENCOUNTER — Ambulatory Visit (INDEPENDENT_AMBULATORY_CARE_PROVIDER_SITE_OTHER): Payer: Medicare Other | Admitting: Ophthalmology

## 2013-03-06 NOTE — Telephone Encounter (Signed)
[facility] request  Last fill date 02/21/2013.    Paralee Pendergrass, LVN

## 2013-03-18 ENCOUNTER — Other Ambulatory Visit: Payer: Self-pay | Admitting: Physician Assistant

## 2013-04-10 ENCOUNTER — Ambulatory Visit (INDEPENDENT_AMBULATORY_CARE_PROVIDER_SITE_OTHER): Payer: Medicare Other | Admitting: Ophthalmology

## 2013-04-10 DIAGNOSIS — H348392 Tributary (branch) retinal vein occlusion, unspecified eye, stable: Secondary | ICD-10-CM

## 2013-04-10 DIAGNOSIS — H35039 Hypertensive retinopathy, unspecified eye: Secondary | ICD-10-CM | POA: Diagnosis not present

## 2013-04-10 DIAGNOSIS — I10 Essential (primary) hypertension: Secondary | ICD-10-CM | POA: Diagnosis not present

## 2013-04-10 DIAGNOSIS — H43819 Vitreous degeneration, unspecified eye: Secondary | ICD-10-CM

## 2013-04-11 ENCOUNTER — Other Ambulatory Visit: Payer: Self-pay | Admitting: Internal Medicine

## 2013-04-11 NOTE — Telephone Encounter (Signed)
[facility] refill request. Last filled date 01/15/2013.  Olman Yono MA

## 2013-04-19 ENCOUNTER — Other Ambulatory Visit: Payer: Self-pay | Admitting: Physician Assistant

## 2013-04-28 ENCOUNTER — Ambulatory Visit (INDEPENDENT_AMBULATORY_CARE_PROVIDER_SITE_OTHER): Payer: Medicare Other | Admitting: Internal Medicine

## 2013-04-28 VITALS — BP 132/84 | HR 56 | Temp 98.7°F | Resp 17 | Ht 65.5 in | Wt 214.0 lb

## 2013-04-28 DIAGNOSIS — G4733 Obstructive sleep apnea (adult) (pediatric): Secondary | ICD-10-CM | POA: Diagnosis not present

## 2013-04-28 DIAGNOSIS — R609 Edema, unspecified: Secondary | ICD-10-CM

## 2013-04-28 DIAGNOSIS — I10 Essential (primary) hypertension: Secondary | ICD-10-CM

## 2013-04-28 MED ORDER — RAMIPRIL 10 MG PO CAPS: 10.0000 mg | ORAL_CAPSULE | Freq: Every day | ORAL | Status: DC

## 2013-04-28 MED ORDER — METOPROLOL TARTRATE 50 MG PO TABS: 50.0000 mg | ORAL_TABLET | Freq: Two times a day (BID) | ORAL | Status: DC

## 2013-04-28 NOTE — Progress Notes (Signed)
  Subjective:    Patient ID: Melissa Murphy, female    DOB: [birth year], 77 y.o.   MRN: [id number]  [facility] patient (here with daughter who is my patient) for med ref for HTN Would like to estab one MD, pref female for consistent f/u--see chart=random Now in process of getting surgery for hernia tho they have insisted on sleep study which was very positive and she is being fitted w/ CPAP  Patient Active Problem List   Diagnosis Date Noted  . Obstructive sleep apnea (adult) (pediatric) 01/10/2013  . Uterine polyp   . Unspecified chronic bronchitis 10/02/2012  . 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 Early satiety 09/10/2012  . Bradycardia 08/19/2012  . Hypertension 04/30/2012  . Edema 09/18/2011  . Leg pain, bilateral 09/18/2011  . Stool color abnormal 09/18/2011  . Anemia 09/18/2011  Current outpatient prescriptions:aspirin 81 MG tablet, Take 81 mg by mouth daily cloNIDine (CATAPRES) 0.1 MG tablet, TAKE 1 TABLET (0.1 MG TOTAL) BY MOUTH 2 (TWO) TIMES DAILY   dorzolamide-timolol (COSOPT) 22.3-6.8 MG/ML ophthalmic solution, Place 1 drop into the right eye 2 (two) times daily metoprolol (LOPRESSOR) 50 MG tablet, Take 1 tablet (50 mg total) by mouth 2 (two) times daily  omeprazole (PRILOSEC) 20 MG capsule, as needed ramipril (ALTACE) 10 MG capsule, Take 1 capsule (10 mg total) by mouth daily  simvastatin (ZOCOR) 20 MG tablet, Take 20 mg by mouth every evening.   tolterodine (DETROL) 2 MG tablet, Take 2 mg by mouth 2 (two) times daily zolpidem (AMBIEN) 10 MG tablet, Take 10 mg by mouth at bedtime as needed   Med list raises questions-?gerd,?hyperlip or prior ASCVD event,?Int Cys    Review of Systems No vision changes No chest pain or palpitations His history of chronic edema and is scheduled for some sort of vascular procedure that sounds like vein stripping     Objective:   Physical Exam BP 132/84  Pulse 56  Temp(Src) 98.7 F (37.1 C) (Oral)  Resp 17  Ht 5' 5.5" (1.664  m)  Wt 214 lb (97.07 kg)  BMI 35.06 kg/m2  SpO2 96% Pupils equal round reactive to light and accommodation/EOMs conjugate No carotid bruit Heart regular without murmur/rate 55 Lungs clear mild pitting edema both extremities Elevated BMI     Assessment & Plan:  Hypertension  Meds ordered this encounter  Medications  . ramipril (ALTACE) 10 MG capsule    Sig: Take 1 capsule (10 mg total) by mouth daily.    Dispense:  90 capsule    Refill:  3  . metoprolol (LOPRESSOR) 50 MG tablet    Sig: Take 1 tablet (50 mg total) by mouth 2 (two) times daily.    Dispense:  180 tablet    Refill:  3   We'll schedule followup visit with Dr. Clelia Croft for primary care

## 2013-05-01 NOTE — Progress Notes (Signed)
Pt is deaf, left message for pt daughter to schedule future CPE with Dr. Launa Grill need to schedule interpreter.

## 2013-05-07 NOTE — Progress Notes (Signed)
Sent pt reminder letter to schedule future establish care visit with Dr. Clelia Croft.

## 2013-05-11 ENCOUNTER — Other Ambulatory Visit: Payer: Self-pay | Admitting: Emergency Medicine

## 2013-05-16 ENCOUNTER — Other Ambulatory Visit: Payer: Self-pay | Admitting: Internal Medicine

## 2013-05-19 ENCOUNTER — Telehealth: Payer: Self-pay | Admitting: Internal Medicine

## 2013-05-19 ENCOUNTER — Other Ambulatory Visit: Payer: Self-pay | Admitting: Internal Medicine

## 2013-05-19 MED ORDER — ZOLPIDEM 10 MG TABLET
1.0000 | ORAL_TABLET | Freq: Every day | ORAL | Status: DC
Start: 2013-05-19 — End: 2013-11-21

## 2013-05-19 NOTE — Telephone Encounter (Signed)
Faxed and filed Rx.    Carlee Tesfaye, LVN

## 2013-05-19 NOTE — Telephone Encounter (Signed)
Received a fax from [facility] they will not except Ambien via E-script, only through fax with a wet signature. They do not have the finger print system set up yet. Thank you Leamon Arnt MA

## 2013-05-19 NOTE — Telephone Encounter (Signed)
[facility]urescript last refill request 04/07/2013  Lana Myleka Moncure MA

## 2013-05-19 NOTE — Telephone Encounter (Signed)
rx written. Please fax.

## 2013-05-22 ENCOUNTER — Ambulatory Visit: Payer: Medicare Other

## 2013-05-22 ENCOUNTER — Ambulatory Visit (INDEPENDENT_AMBULATORY_CARE_PROVIDER_SITE_OTHER): Payer: Medicare Other | Admitting: Family Medicine

## 2013-05-22 ENCOUNTER — Inpatient Hospital Stay (HOSPITAL_COMMUNITY): Admission: RE | Admit: 2013-05-22 | Payer: Medicare Other | Source: Ambulatory Visit

## 2013-05-22 VITALS — BP 118/76 | HR 56 | Temp 98.6°F | Resp 20 | Ht 64.25 in | Wt 212.0 lb

## 2013-05-22 DIAGNOSIS — R1031 Right lower quadrant pain: Secondary | ICD-10-CM | POA: Diagnosis not present

## 2013-05-22 DIAGNOSIS — K5900 Constipation, unspecified: Secondary | ICD-10-CM | POA: Diagnosis not present

## 2013-05-22 DIAGNOSIS — I10 Essential (primary) hypertension: Secondary | ICD-10-CM | POA: Diagnosis not present

## 2013-05-22 DIAGNOSIS — R197 Diarrhea, unspecified: Secondary | ICD-10-CM

## 2013-05-22 DIAGNOSIS — R1084 Generalized abdominal pain: Secondary | ICD-10-CM | POA: Diagnosis not present

## 2013-05-22 DIAGNOSIS — R1032 Left lower quadrant pain: Secondary | ICD-10-CM

## 2013-05-22 LAB — POCT URINALYSIS DIPSTICK
Bilirubin, UA: NEGATIVE
Blood, UA: NEGATIVE
Glucose, UA: NEGATIVE
Nitrite, UA: NEGATIVE
Spec Grav, UA: 1.01

## 2013-05-22 LAB — POCT CBC
Granulocyte percent: 53.1 %G (ref 37–80)
Hemoglobin: 11.9 g/dL — AB (ref 12.2–16.2)
MCH, POC: 30.3 pg (ref 27–31.2)
MCV: 97.9 fL — AB (ref 80–97)
MID (cbc): 0.4 (ref 0–0.9)
MPV: 9.1 fL (ref 0–99.8)
POC Granulocyte: 3.8 (ref 2–6.9)
POC LYMPH PERCENT: 41.1 %L (ref 10–50)
Platelet Count, POC: 163 10*3/uL (ref 142–424)
RBC: 3.93 M/uL — AB (ref 4.04–5.48)
WBC: 7.2 10*3/uL (ref 4.6–10.2)

## 2013-05-22 LAB — POCT UA - MICROSCOPIC ONLY
Crystals, Ur, HPF, POC: NEGATIVE
RBC, urine, microscopic: NEGATIVE

## 2013-05-22 NOTE — Patient Instructions (Signed)
1. PLEASE PRESENT TO EMERGENCY DEPARTMENT AT [facility] LONG.  PLEASE HAND THEM THIS SHEET; YOU ARE PRESENTING FOR CT ABDOMEN AND PELVIS; YOU SHOULD NOT BE EVALUATED BY EMERGENCY DEPARTMENT PHYSICIAN.

## 2013-05-22 NOTE — Progress Notes (Addendum)
[address]   [place], [place]  [address]   [phone]  Subjective:    Patient ID: Melissa Murphy, female    DOB: [birth year], 77 y.o.   MRN: [id number]  This chart was scribed for Ethelda Chick, MD by Greggory Stallion, ED Scribe. This patient's care was started at 4:25 PM.  HPI HPI Comments: Melissa Murphy is a 77 y.o. female who presents to the office complaining of sudden onset, constant right lower abdominal pain that started yesterday. The pain radiates along her lower abdomen. Eating does not worsen her pain. Pt has associated diarrhea and constipation. Her last episode of diarrhea was 2 days ago but she has been constipated. She states her last bowel movement was last night after she left here and states there was some hematochezia last night. Dr. Arlyce Dice recommended stool softener but states it did not help her constipation. She has never had similar pain in the past. Pt states she always has a poor appetite due to a hernia that she is supposed to have surgery for. Pt denies fever, nausea, emesis, black stool, dysuria, hematuria, vaginal discharge, vaginal irritation and vaginal pain. Pt states she does have chronic urinary incontinence. She states Dr. Merla Riches told her it is time for her annual blood tests, etc. She states he cannot do this since he is retiring soon; she needs to establish with another provider. Her last colonoscopy was 04/2012 by Dr. Arlyce Dice which shows mild diverticulosis in the sigmoid and lower descending colon, internal hemorrhoids, single colon polyp found.  Pain does worsen with changes in position.  She also complains of right sided back pain and right knee pain. Pain with getting up from sitting to standing.     Past Medical History  Diagnosis Date   GERD (gastroesophageal reflux disease)    Hypertension    Hyperlipidemia    Glaucoma    Urinary incontinence    Edema    Anemia    Arthritis    DVT (deep venous thrombosis)     in eye, and leg    Osteoporosis    Ulcer    Hernia    Deaf     Requires an interpreter   Uterine polyp    Hiatal hernia    Sleep apnea    Past Surgical History  Procedure Laterality Date   Cataract extraction      both eyes   Goiter removed      age 59   Xanthoma tumor removed     Refractive surgery     Dilatation & currettage/hysteroscopy with resectocope N/A 10/11/2012    Procedure: DILATATION & CURETTAGE/HYSTEROSCOPY WITH RESECTOCOPE;  Surgeon: Leslie Andrea, MD;  Location: [facility];  Service: Gynecology;  Laterality: N/A;  pt is deaf; please contact daughter Purcell Nails [phone]   Eye surgery     Hernia repair     History   Social History   Marital Status: Married    Spouse Name: N/A    Number of Children: 3   Years of Education: Grad    Occupational History   Runner, broadcasting/film/video- Retired    Social History Main Topics   Smoking status: Never Smoker    Smokeless tobacco: Never Used   Alcohol Use: Yes     Comment: occasionally   Drug Use: No   Sexual Activity: Not on file   Other Topics Concern   Not on file   Social History Narrative   No narrative on file   Allergies  Allergen Reactions   Avastin [Bevacizumab] Nausea And Vomiting   Clindamycin/Lincomycin Itching   Novocain [Procaine]     seizures   Sulfa Antibiotics     fever   Prior to Admission medications   Medication Sig Start Date End Date Taking? Authorizing Provider  aspirin 81 MG tablet Take 81 mg by mouth daily.   Yes Historical Provider, MD  cloNIDine (CATAPRES) 0.1 MG tablet TAKE 1 TABLET (0.1 MG TOTAL) BY MOUTH 2 (TWO) TIMES DAILY. 10/02/12  Yes Judi Saa, DO  dorzolamide-timolol (COSOPT) 22.3-6.8 MG/ML ophthalmic solution Place 1 drop into the right eye 2 (two) times daily.   Yes Historical Provider, MD  HYDROcodone-acetaminophen (NORCO) 5-325 MG per tablet Take 1-2 tablets by mouth every 4 (four) hours as needed for pain. 01/13/13  Yes Phillips Odor, MD  meloxicam (MOBIC) 15 MG  tablet Take 1 tablet (15 mg total) by mouth daily. 01/13/13  Yes Phillips Odor, MD  metoprolol (LOPRESSOR) 50 MG tablet Take 1 tablet (50 mg total) by mouth 2 (two) times daily. 04/28/13  Yes Tonye Pearson, MD  omeprazole (PRILOSEC) 20 MG capsule as needed.  09/30/12  Yes Heather M Marte, PA-C  ramipril (ALTACE) 10 MG capsule Take 1 capsule (10 mg total) by mouth daily. 04/28/13  Yes Tonye Pearson, MD  simvastatin (ZOCOR) 20 MG tablet Take 20 mg by mouth every evening.   Yes Historical Provider, MD  tolterodine (DETROL) 2 MG tablet Take 2 mg by mouth 2 (two) times daily.    Yes Historical Provider, MD  zolpidem (AMBIEN) 10 MG tablet Take 10 mg by mouth at bedtime as needed. For sleep   Yes Historical Provider, MD   Filed Vitals:   05/22/13 1523  BP: 118/76  Pulse: 56  Temp: 98.6 F (37 C)  TempSrc: Oral  Resp: 20  Height: 5' 4.25" (1.632 m)  Weight: 212 lb (96.163 kg)  SpO2: 96%     Review of Systems  Constitutional: Negative for fever, chills and diaphoresis.  Respiratory: Negative for shortness of breath.   Cardiovascular: Negative for chest pain and palpitations.  Gastrointestinal: Positive for abdominal pain, diarrhea, constipation and blood in stool. Negative for nausea, vomiting, abdominal distention, anal bleeding and rectal pain.  Genitourinary: Negative for dysuria, frequency, hematuria, flank pain, vaginal discharge, genital sores and vaginal pain.       Positive for urinary incontinence.   Musculoskeletal: Positive for arthralgias and back pain.  Skin: Negative for rash.       Objective:   Physical Exam  Constitutional: She is oriented to person, place, and time. She appears well-developed and well-nourished. No distress.  HENT:  Head: Normocephalic and atraumatic.  Mouth/Throat: Oropharynx is clear and moist.  Eyes: Conjunctivae are normal. Pupils are equal, round, and reactive to light.  Neck: Normal range of motion.  Cardiovascular: Normal rate, regular  rhythm and normal heart sounds.  Exam reveals no gallop and no friction rub.   No murmur heard. Pulmonary/Chest: Effort normal and breath sounds normal. No respiratory distress. She has no wheezes. She has no rhonchi. She has no rales.  Abdominal: Soft. Bowel sounds are normal. There is tenderness (LLQ, suprapubic region). There is guarding (LLQ).  Genitourinary:  Soft stool, in vault. Non tender. No evidence of impaction.   Neurological: She is alert and oriented to person, place, and time.  Skin: Skin is warm and dry. She is not diaphoretic.   Results for orders placed in visit on 05/22/13  POCT CBC  Result Value Range   WBC 7.2  4.6 - 10.2 K/uL   Lymph, poc 3.0  0.6 - 3.4   POC LYMPH PERCENT 41.1  10 - 50 %L   MID (cbc) 0.4  0 - 0.9   POC MID % 5.8  0 - 12 %M   POC Granulocyte 3.8  2 - 6.9   Granulocyte percent 53.1  37 - 80 %G   RBC 3.93 (*) 4.04 - 5.48 M/uL   Hemoglobin 11.9 (*) 12.2 - 16.2 g/dL   HCT, POC 24.4  01.0 - 47.9 %   MCV 97.9 (*) 80 - 97 fL   MCH, POC 30.3  27 - 31.2 pg   MCHC 30.9 (*) 31.8 - 35.4 g/dL   RDW, POC 27.2     Platelet Count, POC 163  142 - 424 K/uL   MPV 9.1  0 - 99.8 fL  POCT URINALYSIS DIPSTICK      Result Value Range   Color, UA amber     Clarity, UA cloudy     Glucose, UA neg     Bilirubin, UA neg     Ketones, UA neg     Spec Grav, UA 1.010     Blood, UA neg     pH, UA 5.5     Protein, UA neg     Urobilinogen, UA 0.2     Nitrite, UA neg     Leukocytes, UA Trace    POCT UA - MICROSCOPIC ONLY      Result Value Range   WBC, Ur, HPF, POC 0-4     RBC, urine, microscopic neg     Bacteria, U Microscopic 1+     Mucus, UA trace     Epithelial cells, urine per micros 1-8     Crystals, Ur, HPF, POC neg     Casts, Ur, LPF, POC neg     Yeast, UA neg     [facility] reading (PRIMARY) by  Dr. Katrinka Blazing.  AAS:  Large stool burden; no free air; no obstruction.      Assessment & Plan:   Hypertension - Plan: Urine culture, Comprehensive metabolic  panel, POCT CBC, POCT urinalysis dipstick, POCT UA - Microscopic Only, DG Abd Acute W/Chest  Unspecified constipation - Plan: CT Abdomen Pelvis W Contrast, CANCELED: CT Abdomen Pelvis W Contrast  Diarrhea - Plan: CT Abdomen Pelvis W Contrast, CANCELED: CT Abdomen Pelvis W Contrast  Abdominal pain, LLQ - Plan: Urine culture, Comprehensive metabolic panel, POCT CBC, POCT urinalysis dipstick, POCT UA - Microscopic Only, DG Abd Acute W/Chest, CT Abdomen Pelvis W Contrast  1. Abdominal pain LLQ:  New onset.  Obtain Urine culture. Obtain CT abd/pelvis to evaluate further due to guarding on exam.   2.  Constipation:  Chronic issue for patient.  Obtain CT abd/pelvis to rule out acute process.  Recommend Miralax daily or bid for chronic constipation.  Colonoscopy UTD and followed by GI. 3.  Diarrhea:  New.  With new onset LLQ abdominal pain; obtain CT abd/pelvis to evaluate for diverticulitis.  BRAT diet, hydration.  RTC if no improvement in upcoming 305 days. 4.  HTN: controlled; encouraged pt and husband to establish with one specific PCP and to advise clinic that will need interpreter to be arranged for visit; pt and husband are deaf. 5. Deafness: stable; communicated via writing during visit.  Recommend scheduling appointments at [facility] so that someone can be arranged to carry out sign language.  I personally performed the services described  in this documentation, which was scribed in my presence.  The recorded information has been reviewed and is accurate.  Nilda Simmer, M.D.  [facility] [address] [place], [place]  [address] [phone] phone [phone] fax

## 2013-05-23 LAB — COMPREHENSIVE METABOLIC PANEL
ALT: 15 U/L (ref 0–35)
AST: 23 U/L (ref 0–37)
CO2: 28 mEq/L (ref 19–32)
Calcium: 9.1 mg/dL (ref 8.4–10.5)
Chloride: 102 mEq/L (ref 96–112)
Creat: 0.72 mg/dL (ref 0.50–1.10)
Potassium: 4.1 mEq/L (ref 3.5–5.3)
Sodium: 135 mEq/L (ref 135–145)
Total Bilirubin: 0.5 mg/dL (ref 0.3–1.2)
Total Protein: 6.7 g/dL (ref 6.0–8.3)

## 2013-05-24 LAB — URINE CULTURE

## 2013-06-03 ENCOUNTER — Other Ambulatory Visit: Payer: Self-pay

## 2013-06-03 MED ORDER — CLONIDINE HCL 0.1 MG PO TABS: ORAL_TABLET | ORAL | Status: DC

## 2013-06-06 ENCOUNTER — Encounter (HOSPITAL_COMMUNITY): Payer: Self-pay

## 2013-06-06 ENCOUNTER — Ambulatory Visit (HOSPITAL_COMMUNITY)
Admission: RE | Admit: 2013-06-06 | Discharge: 2013-06-06 | Disposition: A | Discharge status: Home / Self Care | Payer: Medicare Other | Source: Ambulatory Visit | Attending: Family Medicine | Admitting: Family Medicine

## 2013-06-06 DIAGNOSIS — N289 Disorder of kidney and ureter, unspecified: Secondary | ICD-10-CM | POA: Insufficient documentation

## 2013-06-06 DIAGNOSIS — K5730 Diverticulosis of large intestine without perforation or abscess without bleeding: Secondary | ICD-10-CM | POA: Insufficient documentation

## 2013-06-06 DIAGNOSIS — K4020 Bilateral inguinal hernia, without obstruction or gangrene, not specified as recurrent: Secondary | ICD-10-CM | POA: Insufficient documentation

## 2013-06-06 DIAGNOSIS — Z85850 Personal history of malignant neoplasm of thyroid: Secondary | ICD-10-CM | POA: Insufficient documentation

## 2013-06-06 DIAGNOSIS — J9819 Other pulmonary collapse: Secondary | ICD-10-CM | POA: Diagnosis not present

## 2013-06-06 DIAGNOSIS — I7090 Unspecified atherosclerosis: Secondary | ICD-10-CM | POA: Diagnosis not present

## 2013-06-06 DIAGNOSIS — K449 Diaphragmatic hernia without obstruction or gangrene: Secondary | ICD-10-CM | POA: Diagnosis not present

## 2013-06-06 DIAGNOSIS — R109 Unspecified abdominal pain: Secondary | ICD-10-CM | POA: Diagnosis not present

## 2013-06-06 DIAGNOSIS — Q762 Congenital spondylolisthesis: Secondary | ICD-10-CM | POA: Insufficient documentation

## 2013-06-06 DIAGNOSIS — M47817 Spondylosis without myelopathy or radiculopathy, lumbosacral region: Secondary | ICD-10-CM | POA: Insufficient documentation

## 2013-06-06 DIAGNOSIS — N281 Cyst of kidney, acquired: Secondary | ICD-10-CM | POA: Diagnosis not present

## 2013-06-06 DIAGNOSIS — K5900 Constipation, unspecified: Secondary | ICD-10-CM

## 2013-06-06 DIAGNOSIS — R197 Diarrhea, unspecified: Secondary | ICD-10-CM

## 2013-06-06 DIAGNOSIS — R1032 Left lower quadrant pain: Secondary | ICD-10-CM

## 2013-06-06 MED ORDER — IOHEXOL 300 MG/ML  SOLN
100.0000 mL | Freq: Once | INTRAMUSCULAR | Status: AC | PRN
  Administered 2013-06-06: 100 mL via INTRAVENOUS

## 2013-06-09 DIAGNOSIS — Z23 Encounter for immunization: Secondary | ICD-10-CM | POA: Diagnosis not present

## 2013-06-11 ENCOUNTER — Other Ambulatory Visit: Payer: Self-pay | Admitting: Family Medicine

## 2013-06-11 MED ORDER — CIPROFLOXACIN HCL 500 MG PO TABS: 500.0000 mg | ORAL_TABLET | Freq: Two times a day (BID) | ORAL | Status: DC

## 2013-06-24 ENCOUNTER — Ambulatory Visit (INDEPENDENT_AMBULATORY_CARE_PROVIDER_SITE_OTHER): Payer: Medicare Other | Admitting: General Surgery

## 2013-06-24 ENCOUNTER — Encounter (INDEPENDENT_AMBULATORY_CARE_PROVIDER_SITE_OTHER): Payer: Self-pay | Admitting: General Surgery

## 2013-06-24 ENCOUNTER — Encounter (INDEPENDENT_AMBULATORY_CARE_PROVIDER_SITE_OTHER): Payer: Medicare Other | Admitting: General Surgery

## 2013-06-24 VITALS — BP 130/78 | HR 64 | Temp 97.4°F | Resp 16 | Ht 64.0 in | Wt 210.6 lb

## 2013-06-24 DIAGNOSIS — K3189 Other diseases of stomach and duodenum: Secondary | ICD-10-CM

## 2013-06-24 DIAGNOSIS — M7989 Other specified soft tissue disorders: Secondary | ICD-10-CM

## 2013-06-24 DIAGNOSIS — K319 Disease of stomach and duodenum, unspecified: Secondary | ICD-10-CM | POA: Diagnosis not present

## 2013-06-24 NOTE — Patient Instructions (Signed)
You'll be scheduled for Doppler ultrasound or duplex scan of her lower extremities to be sure that you don't have any new blood clots, since your legs have recently swollen more.  If you have new blood clots and we will have to postpone the surgery. You will be referred back to the lower cardiologists for anti-coagulation therapy for a while  If the x-ray of your legs shows no new clots, then we will call you and schedule you for repair of your hiatal hernia.

## 2013-06-24 NOTE — Progress Notes (Addendum)
Patient ID: Melissa Murphy, female   DOB: 27-Apr-1936, 77 y.o.   MRN: [id number]  Chief Complaint  Patient presents with  . New Evaluation    EPNP reck for hernia sx    HPI Melissa Murphy is a 77 y.o. female.  She returns to see me after a several months absence to discuss possible surgical management of her large hiatal hernia with organoaxial rotation.  She also states that she has had progressive leg swelling, left greater than right and would like to have that evaluated.  I initially evaluated this woman in February 2014 upon referral by Dr. Melvia Heaps. Her cardiologist is Dr. Charlton Haws. Dr. Ellamae Sia is her primary care physician.  In terms of her large hiatal hernia she has had an upper GI which shows organoaxial rotation. In March of 2014 she had ultrasound which was negative for gallstones. On 11/12/2012 she had an upper endoscopy which showed a large hiatal hernia with healthy mucosa but twisting and a volvulus was suspected. She's had a cardiac evaluation by Dr. Charlton Haws on 09/29/2012 and he stated that she would need to be on a clonidine patch in the perioperative period because her blood pressures are labile and because of her DVT we would be need to be very careful with Lovenox prophylaxis. On 06/12/2013 she had a CT of the abdomen for lower abdominal pain. The appendix was normal. The hiatal hernia was again noted was unchanged. I have reviewed and do not see any periumbilical hernia.This was apparently done as an outpatient. She's had a colonoscopy in January which was unremarkable.  Symptomatically she still has some early satiety but doesn't really have much in the way of reflux or water brash. She rarely vomits. There is no pain when she swallows. There is no dysphagia. Just early satiety and she has to eat slowly.   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 Occasional bronchitis.  She's never had a myocardial  infarction. She is followed by Charlton Haws. She has surgery at age 77 through a large incision at the right inframammary fold above the costal margin. 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 Umbilical herniorrhaphy with mesh..  .  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 She is very pleasant and a signing interpreter is with her. She has good comprehension and insight.    HPI  Past Medical History  Diagnosis Date  . GERD (gastroesophageal reflux disease)   . Hypertension   . Hyperlipidemia   . Glaucoma   . Urinary incontinence   . Edema   . Anemia   . Arthritis   . DVT (deep venous thrombosis)     in eye, and leg  . Osteoporosis   . Ulcer   . Hernia   . Deaf     Requires an interpreter  . Uterine polyp   . Hiatal hernia   . Sleep apnea   . Clotting disorder   . Thyroid disease     Past Surgical History  Procedure Laterality Date  . Cataract extraction      both eyes  . Goiter removed      age 20  . Xanthoma tumor removed    . Refractive surgery    . Dilatation & currettage/hysteroscopy with resectocope N/A 10/11/2012    Procedure: DILATATION & CURETTAGE/HYSTEROSCOPY WITH RESECTOCOPE;  Surgeon: Leslie Andrea, MD;  Location: [facility];  Service: Gynecology;  Laterality: N/A;  pt is deaf; please contact daughter Purcell Nails [phone]  . Eye surgery    . Hernia repair      Family History  Problem Relation Age of Onset  . Esophageal cancer Neg Hx   . Rectal cancer Neg Hx   . Stomach cancer Neg Hx   . Cancer Mother     colon  . Stroke Mother   . Heart disease Father     1 yrs old  . Heart disease Brother     MI,   . Hernia Brother   . Migraines Daughter   . Heart disease Paternal Grandmother     Social History History  Substance Use Topics  . Smoking status: Never Smoker   . Smokeless tobacco: Never Used  . Alcohol Use: No     Comment: occasionally    Allergies  Allergen Reactions  . Avastin [Bevacizumab] Nausea And  Vomiting  . Clindamycin/Lincomycin Itching  . Novocain [Procaine]     seizures  . Sulfa Antibiotics     fever    Current Outpatient Prescriptions  Medication Sig Dispense Refill  . aspirin 81 MG tablet Take 81 mg by mouth daily.      . ciprofloxacin (CIPRO) 500 MG tablet Take 1 tablet (500 mg total) by mouth 2 (two) times daily.  20 tablet  0  . cloNIDine (CATAPRES) 0.1 MG tablet TAKE 1 TABLET (0.1 MG TOTAL) BY MOUTH 2 (TWO) TIMES DAILY.  60 tablet  4  . dorzolamide-timolol (COSOPT) 22.3-6.8 MG/ML ophthalmic solution Place 1 drop into the right eye 2 (two) times daily.      . DUREZOL 0.05 % EMUL       . meloxicam (MOBIC) 15 MG tablet Take 1 tablet (15 mg total) by mouth daily.  30 tablet  1  . metoprolol (LOPRESSOR) 50 MG tablet Take 1 tablet (50 mg total) by mouth 2 (two) times daily.  180 tablet  3  . omeprazole (PRILOSEC) 20 MG capsule as needed.       . ramipril (ALTACE) 10 MG capsule Take 1 capsule (10 mg total) by mouth daily.  90 capsule  3  . simvastatin (ZOCOR) 20 MG tablet Take 20 mg by mouth every evening.      . tolterodine (DETROL) 2 MG tablet Take 2 mg by mouth 2 (two) times daily.       Marland Kitchen zolpidem (AMBIEN) 10 MG tablet Take 10 mg by mouth at bedtime as needed. For sleep       No current facility-administered medications for this visit.    Review of Systems Review of Systems  Constitutional: Negative for fever, chills and unexpected weight change.  HENT: Negative for congestion, hearing loss, sore throat, trouble swallowing and voice change.   Eyes: Negative for visual disturbance.  Respiratory: Negative for cough and wheezing.   Cardiovascular: Negative for chest pain, palpitations and leg swelling.  Gastrointestinal: Negative for nausea, vomiting, abdominal pain, diarrhea, constipation, blood in stool, abdominal distention and anal bleeding.  Genitourinary: Negative for hematuria, vaginal bleeding and difficulty urinating.  Musculoskeletal: Negative for arthralgias.   Skin: Negative for rash and wound.  Neurological: Negative for seizures, syncope and headaches.  Hematological: Negative for adenopathy. Does not bruise/bleed easily.  Psychiatric/Behavioral: Negative for confusion.    Blood pressure 130/78, pulse 64, temperature 97.4 F (36.3 C), temperature source Temporal, resp. rate 16, height 5\' 4"  (1.626 m), weight 210 lb 9.6 oz (95.528 kg).  Physical Exam Physical Exam  Constitutional: She  is oriented to person, place, and time. She appears well-developed and well-nourished. No distress.  HENT:  Head: Normocephalic and atraumatic.  Nose: Nose normal.  Mouth/Throat: No oropharyngeal exudate.  Eyes: Conjunctivae and EOM are normal. Pupils are equal, round, and reactive to light. Left eye exhibits no discharge. No scleral icterus.  Neck: Neck supple. No JVD present. No tracheal deviation present. No thyromegaly present.  Thyroidectomy scar  Cardiovascular: Normal rate, regular rhythm, normal heart sounds and intact distal pulses.   No murmur heard. Pulmonary/Chest: Effort normal and breath sounds normal. No respiratory distress. She has no wheezes. She has no rales. She exhibits no tenderness.  Long transverse inframammary scar. No palpable mass.  Abdominal: Soft. Bowel sounds are normal. She exhibits no distension and no mass. There is no tenderness. There is no rebound and no guarding.  Short transverse scar below her umbilicus. Protuberant lower abdomen. Somewhat obese. No obvious hernias. Palpable bilateral femoral pulses  Musculoskeletal: She exhibits edema and tenderness.  Bilateral lower extremity edema knee and foot. Left greater than right. Mild calf tenderness. No warmth. No cords. This may be chronic but I cannot tell for sure.  Lymphadenopathy:    She has no cervical adenopathy.  Neurological: She is alert and oriented to person, place, and time. She exhibits normal muscle tone. Coordination normal.  Skin: Skin is warm. No rash noted.  She is not diaphoretic. No erythema. No pallor.  Psychiatric: She has a normal mood and affect. Her behavior is normal. Judgment and thought content normal.    Data Reviewed Cardiology consultation. Upper endoscopy report. Gallbladder ultrasound. Recent chest x-ray. Recent CT abdomen. Mild records.  Assessment    Giant hiatal hernia with organoaxial rotation. Currently minimally symptomatic. Natural history is high risk for volvulus and strangulation. Once again she is advised to have an elective operation.  Negative colonoscopy January 2014  History umbilical hernia repair with mesh. There were recurrence by CT scan  History thyroidectomy for benign disease  History of right inframammary incision age 82, presumed benign subcutaneous tumor  Hypertension  History DVT left leg, 2008, on Coumadin for one year.  Recent complaint and history of progressive left greater than right leg swelling       Plan    Before anything else is to be done, she will be sent for duplex ultrasound of bilateral lower xtremities to rule out DVT. If she has DVT she'll be referred to [facility] cardiology and Coumadin clinic for management  She was offered an elective operation to repair her hiatal hernia, and she states that she would like to have the hiatal hernia surgery, and assuming the duplex ultrasound of her lower extremities is negative, we will simply call her daughter and tell her that we are going to proceed with scheduling her surgery.  The planned surgery would be a laparoscopic, possible open,  reduction of her hiatal hernia, repair of her diaphragmatic hiatus with or without biologic mesh, gastropexy or possible gastrostomy tube, and consideration of very loose Nissen fundoplication, but she doesn't really have much in the way of reflux.  Once again I discussed the indications, details, techniques, and numerous risk of the surgery with her and her husband through the signing interpreter. She  knows it is a risk of bleeding, infection, splenectomy, recurrence of the hernia, injury to adjacent organs, significant cardiac or pulmonary complications. She understands all these issues and all her questions were answered. She agrees with this plan. She requested the surgery be done.  She will need careful attention to pharmacologic DVT prophylaxis with Lovenox.  Perioperative clonidine patch and possible cardiology consultation.       Angelia Mould. Derrell Lolling, M.D., [facility] General and Minimally invasive Surgery Breast and Colorectal Surgery Office:   [phone] Pager:   [phone]  06/24/2013, 6:04 PM

## 2013-06-25 ENCOUNTER — Other Ambulatory Visit (HOSPITAL_COMMUNITY): Payer: Self-pay | Admitting: General Surgery

## 2013-06-25 ENCOUNTER — Ambulatory Visit (HOSPITAL_COMMUNITY)
Admission: RE | Admit: 2013-06-25 | Discharge: 2013-06-25 | Disposition: A | Discharge status: Home / Self Care | Payer: Medicare Other | Source: Ambulatory Visit | Attending: General Surgery | Admitting: General Surgery

## 2013-06-25 ENCOUNTER — Telehealth (INDEPENDENT_AMBULATORY_CARE_PROVIDER_SITE_OTHER): Payer: Self-pay

## 2013-06-25 ENCOUNTER — Other Ambulatory Visit (INDEPENDENT_AMBULATORY_CARE_PROVIDER_SITE_OTHER): Payer: Self-pay | Admitting: General Surgery

## 2013-06-25 DIAGNOSIS — Z86718 Personal history of other venous thrombosis and embolism: Secondary | ICD-10-CM | POA: Diagnosis not present

## 2013-06-25 DIAGNOSIS — M79609 Pain in unspecified limb: Secondary | ICD-10-CM

## 2013-06-25 DIAGNOSIS — M7989 Other specified soft tissue disorders: Secondary | ICD-10-CM

## 2013-06-25 NOTE — Progress Notes (Signed)
*  Preliminary Results* Bilateral lower extremity venous duplex completed. Bilateral lower extremities are negative for deep vein thrombosis. There is no evidence of Baker's cyst bilaterally.  Preliminary results discussed with Maralyn Sago of Dr.Ingram's office.  06/25/2013  Gertie Fey, RVT, RDCS, RDMS

## 2013-06-25 NOTE — Telephone Encounter (Signed)
Received call report from [facility] in vascular lab that the pt is negative for DVT in both legs.  I advised she can send her home and we will call her about a surgery date.   I notified the patient's daughter that Dr Derrell Lolling will give her Heparin preop and use compression hose to prevent blood clots.  She will also get shots after surgery, not Coumadin.

## 2013-07-24 ENCOUNTER — Other Ambulatory Visit: Payer: Self-pay | Admitting: Internal Medicine

## 2013-07-24 MED ORDER — TOLTERODINE 2 MG TABLET
2.0000 mg | ORAL_TABLET | Freq: Two times a day (BID) | ORAL | Status: DC
Start: 2013-07-24 — End: 2017-02-19

## 2013-07-24 NOTE — Telephone Encounter (Signed)
Pharmacy fax request for Tolterodine Tartrate 2 mg  Last fill date 06/05/2013.    Doreen Salvage, [place]

## 2013-08-11 ENCOUNTER — Ambulatory Visit (INDEPENDENT_AMBULATORY_CARE_PROVIDER_SITE_OTHER): Payer: Medicare Other | Admitting: Ophthalmology

## 2013-08-11 DIAGNOSIS — H35039 Hypertensive retinopathy, unspecified eye: Secondary | ICD-10-CM | POA: Diagnosis not present

## 2013-08-11 DIAGNOSIS — H348392 Tributary (branch) retinal vein occlusion, unspecified eye, stable: Secondary | ICD-10-CM

## 2013-08-11 DIAGNOSIS — H43819 Vitreous degeneration, unspecified eye: Secondary | ICD-10-CM | POA: Diagnosis not present

## 2013-08-11 DIAGNOSIS — I10 Essential (primary) hypertension: Secondary | ICD-10-CM

## 2013-08-15 ENCOUNTER — Telehealth (INDEPENDENT_AMBULATORY_CARE_PROVIDER_SITE_OTHER): Payer: Self-pay | Admitting: General Surgery

## 2013-08-15 NOTE — Telephone Encounter (Signed)
Pt daughter called in and stated pt is not going to coordinate with urology for procedure. There are orders in EPIC she did not want to schedule surgery until we checked with you that it was ok to proceed with scheduling?

## 2013-08-18 ENCOUNTER — Other Ambulatory Visit: Payer: Self-pay | Admitting: Internal Medicine

## 2013-08-18 MED ORDER — OMEPRAZOLE 20 MG CAPSULE,DELAYED RELEASE
1.0000 | DELAYED_RELEASE_CAPSULE | Freq: Two times a day (BID) | ORAL | Status: AC
Start: 2013-08-18 — End: 2014-08-13

## 2013-08-18 NOTE — Telephone Encounter (Signed)
Left message for patient's daughter to call.  Dr Dalbert Batman was unaware of trying to coordinate with urology.  He would like to see pt one more time to make sure everything is in order.  Please make appointment.

## 2013-08-18 NOTE — Telephone Encounter (Signed)
Pharmacy fax request  Last fill date 03/18/13.    Jenene Slickeranielle Kasyn Rolph, LVN

## 2013-08-20 NOTE — Telephone Encounter (Signed)
  Needs office visit with me prior to scheduling surgery. hmi

## 2013-08-22 NOTE — Telephone Encounter (Signed)
Left another message for pt's daughter to call me and discuss appointment

## 2013-08-25 ENCOUNTER — Encounter (HOSPITAL_COMMUNITY): Payer: Self-pay | Admitting: Emergency Medicine

## 2013-08-25 ENCOUNTER — Encounter (INDEPENDENT_AMBULATORY_CARE_PROVIDER_SITE_OTHER): Payer: Medicare Other | Admitting: General Surgery

## 2013-08-25 ENCOUNTER — Emergency Department (HOSPITAL_COMMUNITY)
Admission: EM | Admit: 2013-08-25 | Discharge: 2013-08-25 | Disposition: A | Discharge status: Home / Self Care | Payer: Medicare Other | Attending: Emergency Medicine | Admitting: Emergency Medicine

## 2013-08-25 ENCOUNTER — Emergency Department (HOSPITAL_COMMUNITY): Payer: Medicare Other

## 2013-08-25 DIAGNOSIS — J069 Acute upper respiratory infection, unspecified: Secondary | ICD-10-CM | POA: Diagnosis not present

## 2013-08-25 DIAGNOSIS — Z872 Personal history of diseases of the skin and subcutaneous tissue: Secondary | ICD-10-CM | POA: Insufficient documentation

## 2013-08-25 DIAGNOSIS — E785 Hyperlipidemia, unspecified: Secondary | ICD-10-CM | POA: Insufficient documentation

## 2013-08-25 DIAGNOSIS — Z7982 Long term (current) use of aspirin: Secondary | ICD-10-CM | POA: Insufficient documentation

## 2013-08-25 DIAGNOSIS — H913 Deaf nonspeaking, not elsewhere classified: Secondary | ICD-10-CM | POA: Insufficient documentation

## 2013-08-25 DIAGNOSIS — H409 Unspecified glaucoma: Secondary | ICD-10-CM | POA: Insufficient documentation

## 2013-08-25 DIAGNOSIS — Z79899 Other long term (current) drug therapy: Secondary | ICD-10-CM | POA: Insufficient documentation

## 2013-08-25 DIAGNOSIS — M129 Arthropathy, unspecified: Secondary | ICD-10-CM | POA: Insufficient documentation

## 2013-08-25 DIAGNOSIS — R231 Pallor: Secondary | ICD-10-CM | POA: Diagnosis not present

## 2013-08-25 DIAGNOSIS — Z862 Personal history of diseases of the blood and blood-forming organs and certain disorders involving the immune mechanism: Secondary | ICD-10-CM | POA: Insufficient documentation

## 2013-08-25 DIAGNOSIS — Z86718 Personal history of other venous thrombosis and embolism: Secondary | ICD-10-CM | POA: Insufficient documentation

## 2013-08-25 DIAGNOSIS — R030 Elevated blood-pressure reading, without diagnosis of hypertension: Secondary | ICD-10-CM

## 2013-08-25 DIAGNOSIS — G4730 Sleep apnea, unspecified: Secondary | ICD-10-CM | POA: Diagnosis not present

## 2013-08-25 DIAGNOSIS — B9789 Other viral agents as the cause of diseases classified elsewhere: Secondary | ICD-10-CM | POA: Diagnosis not present

## 2013-08-25 DIAGNOSIS — B338 Other specified viral diseases: Secondary | ICD-10-CM | POA: Diagnosis not present

## 2013-08-25 DIAGNOSIS — R918 Other nonspecific abnormal finding of lung field: Secondary | ICD-10-CM | POA: Diagnosis not present

## 2013-08-25 DIAGNOSIS — I10 Essential (primary) hypertension: Secondary | ICD-10-CM | POA: Insufficient documentation

## 2013-08-25 DIAGNOSIS — K219 Gastro-esophageal reflux disease without esophagitis: Secondary | ICD-10-CM | POA: Insufficient documentation

## 2013-08-25 DIAGNOSIS — Z8742 Personal history of other diseases of the female genital tract: Secondary | ICD-10-CM | POA: Insufficient documentation

## 2013-08-25 DIAGNOSIS — IMO0001 Reserved for inherently not codable concepts without codable children: Secondary | ICD-10-CM

## 2013-08-25 DIAGNOSIS — B349 Viral infection, unspecified: Secondary | ICD-10-CM

## 2013-08-25 LAB — COMPREHENSIVE METABOLIC PANEL
ALBUMIN: 4 g/dL (ref 3.5–5.2)
ALT: 15 U/L (ref 0–35)
AST: 24 U/L (ref 0–37)
Alkaline Phosphatase: 87 U/L (ref 39–117)
BUN: 19 mg/dL (ref 6–23)
CO2: 24 mEq/L (ref 19–32)
CREATININE: 0.68 mg/dL (ref 0.50–1.10)
Calcium: 9.9 mg/dL (ref 8.4–10.5)
Chloride: 100 mEq/L (ref 96–112)
GFR calc Af Amer: >90 mL/min (ref >90)
GFR, EST NON AFRICAN AMERICAN: 83 mL/min — AB (ref >90)
Glucose, Bld: 121 mg/dL — ABNORMAL HIGH (ref 70–99)
Potassium: 4 mEq/L (ref 3.7–5.3)
Sodium: 138 mEq/L (ref 137–147)
Total Bilirubin: 0.3 mg/dL (ref 0.3–1.2)
Total Protein: 8 g/dL (ref 6.0–8.3)

## 2013-08-25 LAB — CBC WITH DIFFERENTIAL/PLATELET
Basophils Absolute: 0 10*3/uL (ref 0.0–0.1)
Basophils Relative: 0 % (ref 0–1)
Eosinophils Absolute: 0.1 10*3/uL (ref 0.0–0.7)
Eosinophils Relative: 1 % (ref 0–5)
HCT: 39.3 % (ref 36.0–46.0)
HEMOGLOBIN: 13.4 g/dL (ref 12.0–15.0)
Lymphocytes Relative: 22 % (ref 12–46)
Lymphs Abs: 1.7 10*3/uL (ref 0.7–4.0)
MCH: 31 pg (ref 26.0–34.0)
MCHC: 34.1 g/dL (ref 30.0–36.0)
MCV: 91 fL (ref 78.0–100.0)
MONO ABS: 0.6 10*3/uL (ref 0.1–1.0)
MONOS PCT: 7 % (ref 3–12)
NEUTROS ABS: 5.5 10*3/uL (ref 1.7–7.7)
Neutrophils Relative %: 69 % (ref 43–77)
Platelets: 205 10*3/uL (ref 150–400)
RBC: 4.32 MIL/uL (ref 3.87–5.11)
RDW: 13.6 % (ref 11.5–15.5)
WBC: 8 10*3/uL (ref 4.0–10.5)

## 2013-08-25 MED ORDER — CLONIDINE HCL 0.1 MG PO TABS
0.1000 mg | ORAL_TABLET | Freq: Once | ORAL | Status: AC
  Administered 2013-08-25: 0.1 mg via ORAL
  Filled 2013-08-25: qty 1

## 2013-08-25 MED ORDER — METOPROLOL TARTRATE 25 MG PO TABS
50.0000 mg | ORAL_TABLET | Freq: Once | ORAL | Status: AC
  Administered 2013-08-25: 50 mg via ORAL
  Filled 2013-08-25: qty 2

## 2013-08-25 NOTE — Discharge Instructions (Signed)
Please call your doctor for a followup appointment within 24-48 hours. When you talk to your doctor please let them know that you were seen in the emergency department and have them acquire all of your records so that they can discuss the findings with you and formulate a treatment plan to fully care for your new and ongoing problems. Please call and set-up an appointment with Dr. Laney Pastor for blood pressure to be re-checked and to be re-assessed by your physician Please rest and stay hydrated Please take blood pressure medications as prescribed Please continue to monitor symptoms closely and if symptoms are to worsen or change (fever greater than 101, chills, sweating, nausea, vomiting, headache, blurred vision, sudden loss of vision, neck pain, neck stiffness, chest pain, shortness of breath, difficulty breathing) please report back to the ED immediately  Upper Respiratory Infection, Adult An upper respiratory infection (URI) is also known as the common cold. It is often caused by a type of germ (virus). Colds are easily spread (contagious). You can pass it to others by kissing, coughing, sneezing, or drinking out of the same glass. Usually, you get better in 1 or 2 weeks.  HOME CARE   Only take medicine as told by your doctor.  Use a warm mist humidifier or breathe in steam from a hot shower.  Drink enough water and fluids to keep your pee (urine) clear or pale yellow.  Get plenty of rest.  Return to work when your temperature is back to normal or as told by your doctor. You may use a face mask and wash your hands to stop your cold from spreading. GET HELP RIGHT AWAY IF:   After the first few days, you feel you are getting worse.  You have questions about your medicine.  You have chills, shortness of breath, or brown or red spit (mucus).  You have yellow or brown snot (nasal discharge) or pain in the face, especially when you bend forward.  You have a fever, puffy (swollen) neck,  pain when you swallow, or white spots in the back of your throat.  You have a bad headache, ear pain, sinus pain, or chest pain.  You have a high-pitched whistling sound when you breathe in and out (wheezing).  You have a lasting cough or cough up blood.  You have sore muscles or a stiff neck. MAKE SURE YOU:   Understand these instructions.  Will watch your condition.  Will get help right away if you are not doing well or get worse. Document Released: 01/17/2008 Document Revised: 10/23/2011 Document Reviewed: 12/05/2010 [facility] Patient Information 2014 [place], [place]. Hypertension As your heart beats, it forces blood through your arteries. This force is your blood pressure. If the pressure is too high, it is called hypertension (HTN) or high blood pressure. HTN is dangerous because you may have it and not know it. High blood pressure may mean that your heart has to work harder to pump blood. Your arteries may be narrow or stiff. The extra work puts you at risk for heart disease, stroke, and other problems.  Blood pressure consists of two numbers, a higher number over a lower, 110/72, for example. It is stated as "110 over 72." The ideal is below 120 for the top number (systolic) and under 80 for the bottom (diastolic). Write down your blood pressure today. You should pay close attention to your blood pressure if you have certain conditions such as:  Heart failure.  Prior heart attack.  Diabetes  Chronic  kidney disease.  Prior stroke.  Multiple risk factors for heart disease. To see if you have HTN, your blood pressure should be measured while you are seated with your arm held at the level of the heart. It should be measured at least twice. A one-time elevated blood pressure reading (especially in the Emergency Department) does not mean that you need treatment. There may be conditions in which the blood pressure is different between your right and left arms. It is important to see  your caregiver soon for a recheck. Most people have essential hypertension which means that there is not a specific cause. This type of high blood pressure may be lowered by changing lifestyle factors such as:  Stress.  Smoking.  Lack of exercise.  Excessive weight.  Drug/tobacco/alcohol use.  Eating less salt. Most people do not have symptoms from high blood pressure until it has caused damage to the body. Effective treatment can often prevent, delay or reduce that damage. TREATMENT  When a cause has been identified, treatment for high blood pressure is directed at the cause. There are a large number of medications to treat HTN. These fall into several categories, and your caregiver will help you select the medicines that are best for you. Medications may have side effects. You should review side effects with your caregiver. If your blood pressure stays high after you have made lifestyle changes or started on medicines,   Your medication(s) may need to be changed.  Other problems may need to be addressed.  Be certain you understand your prescriptions, and know how and when to take your medicine.  Be sure to follow up with your caregiver within the time frame advised (usually within two weeks) to have your blood pressure rechecked and to review your medications.  If you are taking more than one medicine to lower your blood pressure, make sure you know how and at what times they should be taken. Taking two medicines at the same time can result in blood pressure that is too low. SEEK IMMEDIATE MEDICAL CARE IF:  You develop a severe headache, blurred or changing vision, or confusion.  You have unusual weakness or numbness, or a faint feeling.  You have severe chest or abdominal pain, vomiting, or breathing problems. MAKE SURE YOU:   Understand these instructions.  Will watch your condition.  Will get help right away if you are not doing well or get worse. Document Released:  07/31/2005 Document Revised: 10/23/2011 Document Reviewed: 03/20/2008 [facility] Patient Information 2014 [place].   Emergency Department Resource Guide 1) Find a Doctor and Pay Out of Pocket Although you won't have to find out who is covered by your insurance plan, it is a good idea to ask around and get recommendations. You will then need to call the office and see if the doctor you have chosen will accept you as a new patient and what types of options they offer for patients who are self-pay. Some doctors offer discounts or will set up payment plans for their patients who do not have insurance, but you will need to ask so you aren't surprised when you get to your appointment.  2) Contact Your Local Health Department Not all health departments have doctors that can see patients for sick visits, but many do, so it is worth a call to see if yours does. If you don't know where your local health department is, you can check in your phone book. The CDC also has a tool to help  you locate your state's health department, and many state websites also have listings of all of their local health departments.  3) Find a [facility] If your illness is not likely to be very severe or complicated, you may want to try a walk in clinic. These are popping up all over the country in pharmacies, drugstores, and shopping centers. They're usually staffed by nurse practitioners or physician assistants that have been trained to treat common illnesses and complaints. They're usually fairly quick and inexpensive. However, if you have serious medical issues or chronic medical problems, these are probably not your best option.  No Primary Care Doctor: - Call Health Connect at  [phone] - they can help you locate a primary care doctor that  accepts your insurance, provides certain services, etc. - Physician Referral Service- [phone]  Chronic Pain Problems: Organization         Address  Phone   Notes  [facility]  [phone] Patients need to be referred by their primary care doctor.   Medication Assistance: Organization         Address  Phone   Notes  [facility] Medication [facility] [address] [place], [place] [address] [phone] --Must be a resident of [facility] -- Must have NO insurance coverage whatsoever (no Medicaid/ Medicare, etc.) -- The pt. MUST have a primary care doctor that directs their care regularly and follows them in the community   MedAssist  [phone]   [employer]  [phone]    Agencies that provide inexpensive medical care: Organization         Address  Phone   Notes  [place]  [phone]   [facility]    [phone]   [facility] [place],  [phone]   [place] [address] [place] [phone]   Planned Parenthood    [phone]   [facility]    [phone]   [place] [address], [place] Phone:  [phone], Fax:  [phone] Hours of Operation:  9 am - 6 pm, M-F.  Also accepts Medicaid/Medicare and self-pay.  [facility] for [place], [address], [place] Phone: [phone], Fax: [phone]. Hours of Operation:  8:30 am - 5:30 pm, M-F.  Also accepts Medicaid and self-pay.  [facility] [address] [place] Phone: [phone]   [place], [place], [place] [phone], Ext. [phone] Mondays & Thursdays: 7-9 AM.  First 15 patients are seen on a first come, first serve basis.    [place] Providers:  Organization         Address  Phone   Notes  [facility] [address], [place] [phone] Also accepts self-pay patients.  [facility] [address], [place]  [phone]   [place], [address], [place] [phone]   [facility] [address] [place] [phone]   Lucianne Lei [address], [place]   [phone] Only accepts [place] Access [place] patients after they have their name applied to their card.   Self-Pay (no insurance) in [facility]:  Organization  Address  Phone   Notes  Sickle Cell Patients, [facility] [place] [phone]   [facility] Urgent Care [place] [phone]   [facility] [place]  [place], [address], [place] [phone]   [facility]/Dr. Osei-Bonsu  [address] [place] or [address], [place] [phone] Phone number for both [place] and [place] locations is the same.  Urgent Medical and [facility] [address], [place] [phone]   [facility] [address] [place] or [address] [phone]   [facility] [address], [place] [phone], phone; [phone], fax Sees patients 1st and 3rd Saturday of every month.  Must not qualify for public or private insurance (i.e. Medicaid, Medicare, [place] Health Choice, Veterans' Benefits)  Household income should be no more than 200% of the poverty level The clinic cannot treat you if you are pregnant or think you are pregnant  Sexually transmitted diseases are not treated at the clinic.    Dental Care: Organization         Address  Phone  Notes  [facility] Department of [facility] [place] [phone] Accepts children up to age 76 who are enrolled in [place] or [place]; pregnant women with a Medicaid card; and children who have applied for  Medicaid or [place] Health Choice, but were declined, whose parents can pay a reduced fee at time of service.  [facility] Department of [facility]  [address], [place] [phone] Accepts children up to age 29 who are enrolled in [place] or [place]; pregnant women with a Medicaid card; and children who have applied for Medicaid or [place] Health Choice, but were declined, whose parents can pay a reduced fee at time of service.  [place] Adult Dental Access PROGRAM  [place] [phone] Patients are seen by appointment only. Walk-ins are not accepted. [place] will see patients 10 years of age and older. Monday - Tuesday (8am-5pm) Most Wednesdays (8:30-5pm) $30 per visit, cash only  [facility] Adult [facility] Access PROGRAM  [address], [facility] [phone] Patients are seen by appointment only. Walk-ins are not accepted. [place] will see patients 52 years of age and older. One Wednesday Evening (Monthly: Volunteer Based).  $30 per visit, cash only  [place]  [phone] for adults; Children under age 60, call Graduate Pediatric Dentistry at [phone]. Children aged 37-14, please call [phone] to request a pediatric application.  Dental services are provided in all areas of dental care including fillings, crowns and bridges, complete and partial dentures, implants, gum treatment, root canals, and extractions. Preventive care is also provided. Treatment is provided to both adults and children. Patients are selected via a lottery and there is often a waiting list.   [facility] [address] [place]  [phone] [email or web address]   [facility] [address] [place], [place] [phone], Ext. [phone] Second and Fourth Thursday of each month, opens at 6:30 AM; Clinic ends at 9 AM.  Patients are seen on a first-come first-served basis, and a limited number  are seen during each clinic.   [facility]  [address] [place], [place]  [place], [place] [phone]   Eligibility Requirements You must have lived in [place], [place], or [place] counties for at least the last three months.   You cannot be eligible for state or federal sponsored [employer], including [employer], [place], or [employer].   You generally cannot be eligible for healthcare insurance through your employer.    How to apply: Eligibility screenings are held every Tuesday and Wednesday afternoon from 1:00 pm until 4:00 pm. You do not need an appointment for the interview!  [facility] [address] [place], [place]   [place]  [place] Department  [place]  [phone]    Behavioral Health Resources in the Community: Intensive Outpatient Programs Organization         Address  Phone  Notes  [place]isela [place]. [address], [place], [place] [phone]   [facility] Outpatient [address] [place], [place]   ADS: Alcohol & Drug Svcs [address] [place], [place]   [place] [address],  [place], [place] or [phone]   Substance Abuse Resources Organization         Address  Phone  Notes  Alcohol and Drug Services  [phone]   [place]  [phone]   [facility] [place]   Chinita Pester  [phone]   Residential & Outpatient Substance Abuse Program  [phone]   Psychological Services Organization         Address  Phone  Notes  [facility] [place]  [place]  [phone]   [place] [address] [place] or [phone]    Mobile Crisis Teams Organization         Address  Phone  Notes  Therapeutic  Alternatives, Mobile Crisis Care Unit  [phone]   Assertive Psychotherapeutic Services  [address] [place], [place]   [place] [address] [place] [phone]    Self-Help/Support Groups Organization         Address  Phone             Notes  [place]. of [place] - variety of support groups  [place] Call for more information  Narcotics Anonymous (NA), [facility] [address], [employer] [place]  2 meetings at this location   Special educational needs teacher         Address  Phone  Notes  ASAP Residential Treatment [place],    [place]  [phone]   [facility]  [address], [place] [phone], [place], [place]   [place], [place] [phone] Admissions: 8am-3pm M-F  Incentives Substance [place] [address]    [place], [place] [phone]   [facility] [address] Jadene Pierini [place], [place]   [facility] [address],  [place], [place]   Insight Programs - Intensive Outpatient [address] Kristeen [address], [place], [place]   [facility] [place].,  [place], [place] or [phone]   Residential Treatment Services (RTS) [address] [place], [place] Accepts Medicaid  [facility] [place] [address]  [place] [phone] Substance Abuse/Addiction Treatment   [facility] Resources Organization  Address  Phone  Notes  [facility]  [phone]   Domenic Schwab, PhD [address] Arlis Porta [place], [place]   [phone] or [phone]   [place], [place] [phone]   [place] [address], [place], [place] [phone] Insurance/Medicaid/sponsorship through [facility] and Families  [address]                                    [place], [place] [phone] [place] [address] [place], [place] [phone]    Dr. Adele Schilder  [phone]   [facility] Dept. 1) [address],  2) [place] 3)  [place] [address], [place] [phone]  [phone]   [place] [phone] or [phone] (After Hours)

## 2013-08-25 NOTE — ED Notes (Signed)
Pt is deaf but writes and reads lips  States she does not need a sign Ecologist at this time  Pt states her blood pressure is high and she feels sick and has a productive cough  Pt states she just picked up her blood pressure medication on the way here but did not take it yet

## 2013-08-25 NOTE — ED Notes (Signed)
PA made aware of pt's vitals
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
## 2013-08-25 NOTE — ED Provider Notes (Signed)
Discussed case with Junius Creamer, Np. Transfer of care from Junius Creamer, NP.   Melissa Murphy is a 78 y/o F with PMHx of GERD, HTN, HLD, DVT, clotting disorder, hearing impaired presenting to the ED with cough and elevated blood pressure. Patient reported that she has gotten her medication from the pharmacist (Ramipiril, Metoprolol, and Clonidine), but reported that she has not taken the medication just yet.   6:56 AM This provider was made aware that patient's blood pressure is now controlled. Last blood pressure check was 168/78.   Results for orders placed during the hospital encounter of 08/25/13  CBC WITH DIFFERENTIAL      Result Value Range   WBC 8.0  4.0 - 10.5 K/uL   RBC 4.32  3.87 - 5.11 MIL/uL   Hemoglobin 13.4  12.0 - 15.0 g/dL   HCT 39.3  36.0 - 46.0 %   MCV 91.0  78.0 - 100.0 fL   MCH 31.0  26.0 - 34.0 pg   MCHC 34.1  30.0 - 36.0 g/dL   RDW 13.6  11.5 - 15.5 %   Platelets 205  150 - 400 K/uL   Neutrophils Relative % 69  43 - 77 %   Neutro Abs 5.5  1.7 - 7.7 K/uL   Lymphocytes Relative 22  12 - 46 %   Lymphs Abs 1.7  0.7 - 4.0 K/uL   Monocytes Relative 7  3 - 12 %   Monocytes Absolute 0.6  0.1 - 1.0 K/uL   Eosinophils Relative 1  0 - 5 %   Eosinophils Absolute 0.1  0.0 - 0.7 K/uL   Basophils Relative 0  0 - 1 %   Basophils Absolute 0.0  0.0 - 0.1 K/uL  COMPREHENSIVE METABOLIC PANEL      Result Value Range   Sodium 138  137 - 147 mEq/L   Potassium 4.0  3.7 - 5.3 mEq/L   Chloride 100  96 - 112 mEq/L   CO2 24  19 - 32 mEq/L   Glucose, Bld 121 (*) 70 - 99 mg/dL   BUN 19  6 - 23 mg/dL   Creatinine, Ser 0.68  0.50 - 1.10 mg/dL   Calcium 9.9  8.4 - 10.5 mg/dL   Total Protein 8.0  6.0 - 8.3 g/dL   Albumin 4.0  3.5 - 5.2 g/dL   AST 24  0 - 37 U/L   ALT 15  0 - 35 U/L   Alkaline Phosphatase 87  39 - 117 U/L   Total Bilirubin 0.3  0.3 - 1.2 mg/dL   GFR calc non Af Amer 83 (*) >90 mL/min   GFR calc Af Amer >90  >90 mL/min   Dg Chest 2 View  08/25/2013    CLINICAL DATA:  Hiatal hernia.  EXAM: CHEST  2 VIEW  COMPARISON:  05/22/2013  FINDINGS: There is a large hiatal hernia with fluid level. Streaky left base lung opacity is present on 2 previous studies, consistent with scar. No edema or acute infiltrate. No effusion or pneumothorax. No cardiomegaly.  IMPRESSION: Large hiatal hernia with fluid levels.   Electronically Signed   By: Jorje Guild M.D.   On: 08/25/2013 05:45   CBC negative elevation white blood cell count-negative left shift or leukocytosis identified. CMP negative findings. Chest x-ray noted large hiatal hernia-negative acute cardiopulmonary disease identified.  7:01 AM This provider had a long discussion with patient regarding labs and imaging results. All questions answered.   Filed Vitals:  08/25/13 0533 08/25/13 0653 08/25/13 0730 08/25/13 0734  BP: 218/96 168/78    Pulse:      Temp:      TempSrc:      Resp: 16 18 20    SpO2: 95%   98%    Doubt hypertensive urgency/emergency - negative findings of organ damage noted to labs. Doubt SAH. Doubt ICH. Suspicion of elevated blood pressure due to patient not taking medications and for viral illness. Negative findings for pneumonia or acute disease. Blood pressure controlled in ED setting. Patient stable, afebrile. Discharged patient. Referred patient to PCP. Discussed with patient to keep appointment with Dr. Dalbert Batman today, 08/25/2013 at 2:30 PM. Discussed with patient to rest and stay hydrated. Discussed with patient to closely monitor symptoms and if symptoms are to worsen or change to report back to the ED - strict return instructions given.  Patient agreed to plan of care, understood, all questions answered.    Jamse Mead, PA-C 08/25/13 [phone]

## 2013-08-25 NOTE — ED Notes (Signed)
Pt has edema noted to her lower extremities

## 2013-08-27 NOTE — ED Provider Notes (Signed)
Medical screening examination/treatment/procedure(s) were performed by non-physician practitioner and as supervising physician I was immediately available for consultation/collaboration.  EKG Interpretation    Date/Time:  Monday August 25 2013 05:08:06 EST Ventricular Rate:  94 PR Interval:  220 QRS Duration: 81 QT Interval:  372 QTC Calculation: 465 R Axis:   -3 Text Interpretation:  Sinus tachycardia Atrial premature complexes Prolonged PR interval Abnormal R-wave progression, early transition Borderline T wave abnormalities No old tracing to compare Confirmed by Irem Stoneham  MD, Catcher Dehoyos [id number]) on 08/25/2013 5:28:41 AM             Kalman Drape, MD 08/27/13 2154

## 2013-08-27 NOTE — ED Provider Notes (Signed)
Medical screening examination/treatment/procedure(s) were performed by non-physician practitioner and as supervising physician I was immediately available for consultation/collaboration.  EKG Interpretation    Date/Time:  Monday August 25 2013 05:08:06 EST Ventricular Rate:  94 PR Interval:  220 QRS Duration: 81 QT Interval:  372 QTC Calculation: 465 R Axis:   -3 Text Interpretation:  Sinus tachycardia Atrial premature complexes Prolonged PR interval Abnormal R-wave progression, early transition Borderline T wave abnormalities No old tracing to compare Confirmed by Eymi Lipuma  MD, Welborn Keena ([id number]) on 08/25/2013 5:28:41 AM             Kalman Drape, MD 08/27/13 1317

## 2013-08-30 ENCOUNTER — Encounter (HOSPITAL_COMMUNITY): Payer: Self-pay | Admitting: Emergency Medicine

## 2013-08-30 ENCOUNTER — Emergency Department (HOSPITAL_COMMUNITY): Payer: Medicare Other

## 2013-08-30 ENCOUNTER — Emergency Department (HOSPITAL_COMMUNITY)
Admission: EM | Admit: 2013-08-30 | Discharge: 2013-08-30 | Disposition: A | Discharge status: Home / Self Care | Payer: Medicare Other | Attending: Emergency Medicine | Admitting: Emergency Medicine

## 2013-08-30 DIAGNOSIS — Z872 Personal history of diseases of the skin and subcutaneous tissue: Secondary | ICD-10-CM | POA: Diagnosis not present

## 2013-08-30 DIAGNOSIS — Z86718 Personal history of other venous thrombosis and embolism: Secondary | ICD-10-CM | POA: Diagnosis not present

## 2013-08-30 DIAGNOSIS — Z862 Personal history of diseases of the blood and blood-forming organs and certain disorders involving the immune mechanism: Secondary | ICD-10-CM | POA: Insufficient documentation

## 2013-08-30 DIAGNOSIS — M129 Arthropathy, unspecified: Secondary | ICD-10-CM | POA: Diagnosis not present

## 2013-08-30 DIAGNOSIS — R609 Edema, unspecified: Secondary | ICD-10-CM | POA: Insufficient documentation

## 2013-08-30 DIAGNOSIS — S40029A Contusion of unspecified upper arm, initial encounter: Secondary | ICD-10-CM | POA: Diagnosis not present

## 2013-08-30 DIAGNOSIS — M7989 Other specified soft tissue disorders: Secondary | ICD-10-CM | POA: Diagnosis not present

## 2013-08-30 DIAGNOSIS — Z7982 Long term (current) use of aspirin: Secondary | ICD-10-CM | POA: Insufficient documentation

## 2013-08-30 DIAGNOSIS — I10 Essential (primary) hypertension: Secondary | ICD-10-CM

## 2013-08-30 DIAGNOSIS — Y939 Activity, unspecified: Secondary | ICD-10-CM | POA: Insufficient documentation

## 2013-08-30 DIAGNOSIS — R51 Headache: Secondary | ICD-10-CM | POA: Diagnosis not present

## 2013-08-30 DIAGNOSIS — X58XXXA Exposure to other specified factors, initial encounter: Secondary | ICD-10-CM | POA: Insufficient documentation

## 2013-08-30 DIAGNOSIS — R32 Unspecified urinary incontinence: Secondary | ICD-10-CM | POA: Diagnosis not present

## 2013-08-30 DIAGNOSIS — Z8742 Personal history of other diseases of the female genital tract: Secondary | ICD-10-CM | POA: Insufficient documentation

## 2013-08-30 DIAGNOSIS — E785 Hyperlipidemia, unspecified: Secondary | ICD-10-CM | POA: Insufficient documentation

## 2013-08-30 DIAGNOSIS — K219 Gastro-esophageal reflux disease without esophagitis: Secondary | ICD-10-CM | POA: Insufficient documentation

## 2013-08-30 DIAGNOSIS — H913 Deaf nonspeaking, not elsewhere classified: Secondary | ICD-10-CM | POA: Insufficient documentation

## 2013-08-30 DIAGNOSIS — G4730 Sleep apnea, unspecified: Secondary | ICD-10-CM | POA: Diagnosis not present

## 2013-08-30 DIAGNOSIS — Z79899 Other long term (current) drug therapy: Secondary | ICD-10-CM | POA: Insufficient documentation

## 2013-08-30 DIAGNOSIS — Y929 Unspecified place or not applicable: Secondary | ICD-10-CM | POA: Insufficient documentation

## 2013-08-30 DIAGNOSIS — H409 Unspecified glaucoma: Secondary | ICD-10-CM | POA: Diagnosis not present

## 2013-08-30 DIAGNOSIS — S40021A Contusion of right upper arm, initial encounter: Secondary | ICD-10-CM

## 2013-08-30 MED ORDER — CLONIDINE HCL 0.1 MG PO TABS
0.1000 mg | ORAL_TABLET | Freq: Once | ORAL | Status: AC
  Administered 2013-08-30: 0.1 mg via ORAL
  Filled 2013-08-30: qty 1

## 2013-08-30 NOTE — ED Notes (Signed)
Per Dr. Reather Converse, MD. Pt.does not need labs, pt. labs recently drawn 08/25/2013.

## 2013-08-30 NOTE — ED Notes (Signed)
Pt arrived to the ED with a complaint of hypertension and a bruise that showed up suddenly without a known cause.  Pt is deaf and a sign interpreter has been called.  Pt indicates that she has been having some issues with high blood pressure sucj as pedal and hand swelling.  Pt states she takes her prescriptions regularly.

## 2013-08-30 NOTE — ED Notes (Signed)
Pt states her legs have been swollen since the 90's but they are getting worse,  Pt has 3 plus edema

## 2013-08-30 NOTE — ED Provider Notes (Signed)
CSN: [id number]     Arrival date & time 08/30/13  0214 History   First MD Initiated Contact with Patient 08/30/13 0407     Chief Complaint  Patient presents with  . Hypertension  . Bleeding/Bruising   (Consider location/radiation/quality/duration/timing/severity/associated sxs/prior Treatment) HPI Comments: 78 yo female deaf, chronic leg edema, sleep apnea, htn presents with right arm bruising and htn.  Pt has hx of htn, on clonidine, no recent changes or missed doses presents with noticing bruise to right arm since yesterday, no injury recalled. Pt did have blood drawn 2 days prior.  No significant swelling, more pain.  Blood clot hx years ago left LE, no current abx.  No recent surgeries, new leg swelling or active CA.    Patient is a 78 y.o. female presenting with hypertension. The history is provided by the patient.  Hypertension Associated symptoms include headaches (very mild similar to previous). Pertinent negatives include no chest pain, no abdominal pain and no shortness of breath.    Past Medical History  Diagnosis Date  . GERD (gastroesophageal reflux disease)   . Hypertension   . Hyperlipidemia   . Glaucoma   . Urinary incontinence   . Edema   . Anemia   . Arthritis   . DVT (deep venous thrombosis)     in eye, and leg  . Osteoporosis   . Ulcer   . Hernia   . Deaf     Requires an interpreter  . Uterine polyp   . Hiatal hernia   . Sleep apnea   . Clotting disorder   . Thyroid disease    Past Surgical History  Procedure Laterality Date  . Cataract extraction      both eyes  . Goiter removed      age 49  . Xanthoma tumor removed    . Refractive surgery    . Dilatation & currettage/hysteroscopy with resectocope N/A 10/11/2012    Procedure: DILATATION & CURETTAGE/HYSTEROSCOPY WITH RESECTOCOPE;  Surgeon: Allena Katz, MD;  Location: [facility];  Service: Gynecology;  Laterality: N/A;  pt is deaf; please contact daughter Heide Spark [phone]  . Eye surgery    .  Hernia repair     Family History  Problem Relation Age of Onset  . Esophageal cancer Neg Hx   . Rectal cancer Neg Hx   . Stomach cancer Neg Hx   . Cancer Mother     colon  . Stroke Mother   . Heart disease Father     25 yrs old  . Heart disease Brother     MI,   . Hernia Brother   . Migraines Daughter   . Heart disease Paternal Grandmother    History  Substance Use Topics  . Smoking status: Never Smoker   . Smokeless tobacco: Never Used  . Alcohol Use: No     Comment: occasionally   OB History   Grav Para Term Preterm Abortions TAB SAB Ect Mult Living                 Review of Systems  Constitutional: Negative for fever and chills.  Eyes: Negative for visual disturbance.  Respiratory: Negative for shortness of breath.   Cardiovascular: Positive for leg swelling (chronic for years, bilateral). Negative for chest pain.  Gastrointestinal: Negative for vomiting and abdominal pain.  Genitourinary: Negative for dysuria and flank pain.  Musculoskeletal: Negative for back pain, neck pain and neck stiffness.  Skin: Positive for color change. Negative for rash.  Neurological: Positive for headaches (very mild similar to previous). Negative for light-headedness.    Allergies  Avastin; Clindamycin/lincomycin; Novocain; and Sulfa antibiotics  Home Medications   Current Outpatient Rx  Name  Route  Sig  Dispense  Refill  . aspirin 81 MG tablet   Oral   Take 81 mg by mouth every morning.          . cloNIDine (CATAPRES) 0.1 MG tablet   Oral   Take 0.1 mg by mouth 2 (two) times daily.         Marland Kitchen docusate sodium (COLACE) 100 MG capsule   Oral   Take 100 mg by mouth daily as needed for mild constipation.         . dorzolamide-timolol (COSOPT) 22.3-6.8 MG/ML ophthalmic solution   Right Eye   Place 1 drop into the right eye 2 (two) times daily.         . meloxicam (MOBIC) 15 MG tablet   Oral   Take 15 mg by mouth daily as needed (arthritis pain).         .  metoprolol (LOPRESSOR) 50 MG tablet   Oral   Take 1 tablet (50 mg total) by mouth 2 (two) times daily.   180 tablet   3   . omeprazole (PRILOSEC) 20 MG capsule   Oral   Take 20 mg by mouth 2 (two) times daily.          . ramipril (ALTACE) 10 MG capsule   Oral   Take 1 capsule (10 mg total) by mouth daily.   90 capsule   3   . simvastatin (ZOCOR) 20 MG tablet   Oral   Take 20 mg by mouth every evening.         . tolterodine (DETROL) 2 MG tablet   Oral   Take 2 mg by mouth 2 (two) times daily.          Marland Kitchen zolpidem (AMBIEN) 10 MG tablet   Oral   Take 10 mg by mouth at bedtime. For sleep          BP 161/91  Pulse 60  Temp(Src) 98.3 F (36.8 C) (Oral)  Resp 18  SpO2 96% Physical Exam  Nursing note and vitals reviewed. Constitutional: She appears well-developed and well-nourished.  HENT:  Head: Normocephalic and atraumatic.  Eyes: Conjunctivae are normal. Right eye exhibits no discharge. Left eye exhibits no discharge.  Neck: Normal range of motion. Neck supple. No tracheal deviation present.  Cardiovascular: Normal rate and regular rhythm.   Pulmonary/Chest: Effort normal and breath sounds normal.  Abdominal: Soft. She exhibits no distension. There is tenderness (mild epig (pt says similar to hernia hx pain)). There is no guarding.  Musculoskeletal: She exhibits edema (2+ bilateral LE ).  Neurological: She is alert.  Skin: Skin is warm.  Ecchymosis right AC, focal tenderness, no crepitus/ warmth or erythema, no swelling appreciated, no streaking from ecchymosis    ED Course  Procedures (including critical care time) Labs Review Labs Reviewed - No data to display Imaging Review No results found.  EKG Interpretation   None       MDM   1. Traumatic hematoma of right upper arm   2. Leg swelling   3. HTN (hypertension)    Pt had US done recently of legs, no dvt per her.  She is followed closely outpt. Recently had blood work, reviewed, no acute  findings, no changes in sxs or signs since except bruise.  Pt not on blood thinners.   Clinically very low likelihood of dvt right upper arm with no recent surgery/ no recent lines or admission. LE swelling chronic. No indication for repeat blood work at this time. PO clonidine in ED, no signs or sxs of end organ damage.   Rec [place] to rule out DVT in ED, pt does not want to wait and will see pcp, pt understands it may be dvt and pt die from PEs.  Translator present for conversations. Filed Vitals:   08/30/13 0233 08/30/13 0445  BP: 199/60 161/91  Pulse: 60   Temp: 98.3 F (36.8 C)   TempSrc: Oral   Resp: 18   SpO2: 96%     Results and differential diagnosis were discussed with the patient. Close follow up outpatient was discussed, patient comfortable with the plan.   Diagnosis: above    Mariea Clonts, MD 08/30/13 [phone]

## 2013-08-30 NOTE — Discharge Instructions (Signed)
If you were given medicines take as directed.  If you are on coumadin or contraceptives realize their levels and effectiveness is altered by many different medicines.  If you have any reaction (rash, tongues swelling, other) to the medicines stop taking and see a physician.   Please follow up as directed and return to the ER or see a physician for new or worsening symptoms (ie. Fevers, shortness of breath).  Thank you. Use ice and tylenol for pain  Arrange outpatient ultrasounds of extremities as discussed.

## 2013-09-03 ENCOUNTER — Ambulatory Visit (INDEPENDENT_AMBULATORY_CARE_PROVIDER_SITE_OTHER): Payer: Medicare Other | Admitting: Family Medicine

## 2013-09-03 VITALS — BP 156/96 | HR 54 | Temp 98.5°F | Resp 16 | Ht 64.0 in | Wt 209.0 lb

## 2013-09-03 DIAGNOSIS — B356 Tinea cruris: Secondary | ICD-10-CM | POA: Diagnosis not present

## 2013-09-03 DIAGNOSIS — J209 Acute bronchitis, unspecified: Secondary | ICD-10-CM

## 2013-09-03 DIAGNOSIS — I10 Essential (primary) hypertension: Secondary | ICD-10-CM

## 2013-09-03 DIAGNOSIS — R609 Edema, unspecified: Secondary | ICD-10-CM | POA: Diagnosis not present

## 2013-09-03 DIAGNOSIS — R600 Localized edema: Secondary | ICD-10-CM

## 2013-09-03 MED ORDER — AZITHROMYCIN 250 MG PO TABS: ORAL_TABLET | ORAL | Status: DC

## 2013-09-03 MED ORDER — KETOCONAZOLE 2 % EX CREA: 1.0000 "application " | TOPICAL_CREAM | Freq: Two times a day (BID) | CUTANEOUS | Status: DC

## 2013-09-03 MED ORDER — FUROSEMIDE 20 MG PO TABS: 20.0000 mg | ORAL_TABLET | Freq: Every morning | ORAL | Status: DC

## 2013-09-03 NOTE — Progress Notes (Signed)
Patient ID: Melissa Murphy MRN: [id number], DOB: 1935-12-21, 78 y.o. Date of Encounter: 09/03/2013, 7:38 PM  Primary Physician: Leandrew Koyanagi, MD  Chief Complaint: Hypertension  HPI: 78 y.o. year old female who is deaf with an interpreter who is her daughter with history below presents to [facility] complaining of hypertension. Pt states that she was seen in the ED for it and was given medication in the hospital but nothing to go home with. Pt is complaining of associated leg and arm swelling.   Pt is also complaining of a  Productive cough with yellow sputum. She states that she was seen at another Urgent [facility] for it. She was diagnosed with bronchitis and given medication for it, but her cough has not resolved.   Thirdly, pt is complaining of a rush under her left breast. She states that she's never had a similar episode before. She denies any headache.  Cough treated for bronchitis, back pain   Pt is due for a surgical procedure, but was told that the procedure cannot be done until she resolves her current symptoms   Past Medical History  Diagnosis Date   GERD (gastroesophageal reflux disease)    Hypertension    Hyperlipidemia    Glaucoma    Urinary incontinence    Edema    Anemia    Arthritis    DVT (deep venous thrombosis)     in eye, and leg   Osteoporosis    Ulcer    Hernia    Deaf     Requires an interpreter   Uterine polyp    Hiatal hernia    Sleep apnea    Clotting disorder    Thyroid disease      Home Meds: Prior to Admission medications   Medication Sig Start Date End Date Taking? Authorizing Provider  aspirin 81 MG tablet Take 81 mg by mouth every morning.    Yes Historical Provider, MD  cloNIDine (CATAPRES) 0.1 MG tablet Take 0.1 mg by mouth 2 (two) times daily.   Yes Historical Provider, MD  docusate sodium (COLACE) 100 MG capsule Take 100 mg by mouth daily as needed for mild constipation.   Yes  Historical Provider, MD  dorzolamide-timolol (COSOPT) 22.3-6.8 MG/ML ophthalmic solution Place 1 drop into the right eye 2 (two) times daily.   Yes Historical Provider, MD  meloxicam (MOBIC) 15 MG tablet Take 15 mg by mouth daily as needed (arthritis pain).   Yes Historical Provider, MD  metoprolol (LOPRESSOR) 50 MG tablet Take 1 tablet (50 mg total) by mouth 2 (two) times daily. 04/28/13  Yes Leandrew Koyanagi, MD  omeprazole (PRILOSEC) 20 MG capsule Take 20 mg by mouth 2 (two) times daily.  09/30/12  Yes Heather M Marte, PA-C  ramipril (ALTACE) 10 MG capsule Take 1 capsule (10 mg total) by mouth daily. 04/28/13  Yes Leandrew Koyanagi, MD  simvastatin (ZOCOR) 20 MG tablet Take 20 mg by mouth every evening.   Yes Historical Provider, MD  tolterodine (DETROL) 2 MG tablet Take 2 mg by mouth 2 (two) times daily.    Yes Historical Provider, MD  zolpidem (AMBIEN) 10 MG tablet Take 10 mg by mouth at bedtime. For sleep   Yes Historical Provider, MD    Allergies:  Allergies  Allergen Reactions   Avastin [Bevacizumab] Nausea And Vomiting   Clindamycin/Lincomycin Itching   Novocain [Procaine] Other (See Comments)    seizures   Sulfa Antibiotics Other (See Comments)  fever    History   Social History   Marital Status: Married    Spouse Name: N/A    Number of Children: 3   Years of Education: Building control surveyor- Retired    Social History Main Topics   Smoking status: Never Smoker    Smokeless tobacco: Never Used   Alcohol Use: No     Comment: occasionally   Drug Use: No   Sexual Activity: Not on file   Other Topics Concern   Not on file   Social History Narrative   No narrative on file     Review of Systems: Constitutional: negative for chills, fever, night sweats, weight changes, or fatigue  HEENT: negative for vision changes, hearing loss, congestion, rhinorrhea, ST, epistaxis, or sinus pressure Cardiovascular: negative for chest pain or  palpitations Respiratory: negative for hemoptysis, wheezing, shortness of breath, or cough Abdominal: negative for abdominal pain, nausea, vomiting, diarrhea, or constipation Dermatological: positive for rash under right breast Neurologic: negative for headache, dizziness, or syncope All other systems reviewed and are otherwise negative with the exception to those above and in the HPI.   Physical Exam: Blood pressure 156/96, pulse 54, temperature 98.5 F (36.9 C), temperature source Oral, resp. rate 16, height 5\' 4"  (1.626 m), weight 209 lb (94.802 kg), SpO2 96.00%., Body mass index is 35.86 kg/(m^2). General: Well developed, well nourished, in no acute distress. Head: Normocephalic, atraumatic, eyes without discharge, sclera non-icteric, nares are without discharge. Bilateral auditory canals clear, TM's are without perforation, pearly grey and translucent with reflective cone of light bilaterally. Oral cavity moist, posterior pharynx without exudate, erythema, peritonsillar abscess, or post nasal drip.  Neck: Supple. No thyromegaly. Full ROM. No lymphadenopathy. Lungs: Clear bilaterally to auscultation with wheezes, but no  rales, or rhonchi. Breathing is unlabored. Heart: RRR with S1 S2. No murmurs, rubs, or gallops appreciated. Abdomen: Soft, non-tender, non-distended with normoactive bowel sounds. No hepatomegaly. No rebound/guarding. No obvious abdominal masses. Msk:  Strength and tone normal for age. Extremities/Skin: Warm and dry. No clubbing or cyanosis. No edema. No  suspicious lesions.  Bilateral 2+ edemaconfluent intertrigo below right breast Neuro: Alert and oriented X 3. Moves all extremities spontaneously. Gait is normal. CNII-XII grossly in tact. Psych:  Responds to questions appropriately with a normal affect.   Labs:   ASSESSMENT AND PLAN:  78 y.o. year old female with Hypertension - Plan: furosemide (LASIX) 20 MG tablet  Tinea cruris - Plan: ketoconazole (NIZORAL) 2 %  cream  Acute bronchitis - Plan: azithromycin (ZITHROMAX Z-PAK) 250 MG tablet  Pedal edema     Signed, Robyn Haber, MD 09/03/2013 7:38 PM

## 2013-09-22 ENCOUNTER — Ambulatory Visit (INDEPENDENT_AMBULATORY_CARE_PROVIDER_SITE_OTHER): Payer: Medicare Other | Admitting: Family Medicine

## 2013-09-22 VITALS — BP 140/78 | HR 49 | Temp 97.9°F | Resp 18 | Ht 64.5 in | Wt 207.8 lb

## 2013-09-22 DIAGNOSIS — J069 Acute upper respiratory infection, unspecified: Secondary | ICD-10-CM

## 2013-09-22 MED ORDER — AMOXICILLIN 875 MG PO TABS: 875.0000 mg | ORAL_TABLET | Freq: Two times a day (BID) | ORAL | Status: DC

## 2013-09-22 MED ORDER — GUAIFENESIN-CODEINE 200-10 MG/5ML PO LIQD: 5.0000 mL | Freq: Three times a day (TID) | ORAL | Status: DC | PRN

## 2013-09-22 NOTE — Progress Notes (Signed)
SUBJECTIVE:   URI symptoms:  Melissa Murphy is a 78 y.o. female with deafness who presents with her daughter as her interpreter and who complains of URI symptoms present for past 5-7 days.  Describes rhinorrhea, sinus congestion, moderate cough.  Has tried OTC decongestants.   Sick contacts are husband and daughter.  Some chills. No nausea or vomiting.  Denies smoking cigarettes.  Has had some elevated BP's at home.  Was taking OTC decongestants.  Pressures are good here.  Seen in ED for the same.    Diagnosed with bronchitis late January.  Recovered from that and now feeling "crummy" again with URI symptoms.      OBJECTIVE: BP 140/78  Pulse 49  Temp(Src) 97.9 F (36.6 C)  Resp 18  Ht 5' 4.5" (1.638 m)  Wt 207 lb 12.8 oz (94.257 kg)  BMI 35.13 kg/m2  SpO2 98% Gen:  Patient sitting on exam table, appears stated age in no acute distress Head: Normocephalic atraumatic Eyes: EOMI, PERRL, sclera and conjunctiva non-erythematous Nose:  Nasal turbinates grossly enlarged bilaterally.  Mouth: Mucosa membranes moist. No erythema Neck: No cervical lymphadenopathy noted Heart:  RRR. Pulm:  Clear to auscultation bilaterally with good air movement.  No wheezes or rales noted.     Impression/Plan: 1. URI: - Plan to treat due to length of symptoms and no improvement.   Will prescribe Amoxicillin x 10 days. Extended course.  Instructed patient to return in 1 week for checkup or sooner if worsening or no improvement.  Guaifenesin for relief of cough symptoms.    2.  Elevated BP's: - normal here today.  - FU to ensure blood pressure is normal once well

## 2013-09-24 ENCOUNTER — Encounter (INDEPENDENT_AMBULATORY_CARE_PROVIDER_SITE_OTHER): Payer: Medicare Other | Admitting: General Surgery

## 2013-09-24 ENCOUNTER — Telehealth: Payer: Self-pay

## 2013-09-24 MED ORDER — PROMETHAZINE-CODEINE 6.25-10 MG/5ML PO SYRP: 5.0000 mL | ORAL_SOLUTION | Freq: Four times a day (QID) | ORAL | Status: DC | PRN

## 2013-09-24 NOTE — Telephone Encounter (Signed)
Meds ordered this encounter  Medications  . promethazine-codeine (PHENERGAN WITH CODEINE) 6.25-10 MG/5ML syrup    Sig: Take 5-10 mLs by mouth every 6 (six) hours as needed for cough.    Dispense:  120 mL    Refill:  0    Order Specific Question:  Supervising Provider    Answer:  Tami Lin P [[id number]]   Please call this to the pharmacy

## 2013-09-24 NOTE — Telephone Encounter (Signed)
Pt's daughter called and said pharm wouldn't fill cough med and asked that we call to have them fill it. Pharm reported that ins will not cover this. Can we try a different cough med?

## 2013-09-25 ENCOUNTER — Ambulatory Visit (INDEPENDENT_AMBULATORY_CARE_PROVIDER_SITE_OTHER): Payer: Medicare Other | Admitting: Cardiovascular Disease

## 2013-09-25 ENCOUNTER — Encounter: Payer: Self-pay | Admitting: Cardiovascular Disease

## 2013-09-25 VITALS — BP 130/80 | HR 88 | Resp 12 | Ht 66.0 in | Wt 209.4 lb

## 2013-09-25 DIAGNOSIS — Z5181 Encounter for therapeutic drug level monitoring: Secondary | ICD-10-CM | POA: Insufficient documentation

## 2013-09-25 DIAGNOSIS — R609 Edema, unspecified: Secondary | ICD-10-CM

## 2013-09-25 DIAGNOSIS — Z01810 Encounter for preprocedural cardiovascular examination: Secondary | ICD-10-CM | POA: Diagnosis not present

## 2013-09-25 DIAGNOSIS — I10 Essential (primary) hypertension: Secondary | ICD-10-CM

## 2013-09-25 NOTE — Telephone Encounter (Signed)
Called in script, advised pt daughter.

## 2013-09-25 NOTE — Assessment & Plan Note (Signed)
Missed appt with Dr Dalbert Batman due to bronchitis Nervous about surgery No cardiac contraindications to proceeding

## 2013-09-25 NOTE — Assessment & Plan Note (Signed)
Well controlled.  Continue current medications and low sodium Dash type diet.   Meds adjusted by primary at [facility]

## 2013-09-25 NOTE — Progress Notes (Signed)
Patient ID: Melissa Murphy, female   DOB: February 13, 1936, 78 y.o.   MRN: [id number] 78 yo referred by Dr Dalbert Batman for preop clearance No real cardiac issues Long standing HTN and LE venous insufficiency On statin for elevated lipids Having a uterine polyp removed tomorrow and then nees a large hiatal hernia repaired No history of CAD, CHF or afib. History of DVT and needs meticulous post op DVT prophylaxis. She is deaf and interview done with interpretor/signer. Had her grand daughter Madision with her. No bleeding diathesis. No previous issues with anesthesia      ROS: Denies fever, malais, weight loss, blurry vision, decreased visual acuity, cough, sputum, SOB, hemoptysis, pleuritic pain, palpitaitons, heartburn, abdominal pain, melena, lower extremity edema, claudication, or rash.  All other systems reviewed and negative  General: Affect appropriate Overweight deaf female HEENT: normal Neck supple with no adenopathy JVP normal no bruits no thyromegaly Lungs clear with no wheezing and good diaphragmatic motion Heart:  S1/S2 no murmur, no rub, gallop or click PMI normal Abdomen: benighn, BS positve, no tenderness, no AAA no bruit.  No HSM or HJR Distal pulses intact with no bruits Plus 1-2 bilateral edema varicosities  Neuro non-focal Skin warm and dry No muscular weakness   Current Outpatient Prescriptions  Medication Sig Dispense Refill  . amoxicillin (AMOXIL) 875 MG tablet Take 1 tablet (875 mg total) by mouth 2 (two) times daily.  20 tablet  0  . aspirin 81 MG tablet Take 81 mg by mouth every morning.       Marland Kitchen azithromycin (ZITHROMAX Z-PAK) 250 MG tablet Take as directed on pack  6 tablet  0  . cloNIDine (CATAPRES) 0.1 MG tablet Take 0.1 mg by mouth 2 (two) times daily.      Marland Kitchen docusate sodium (COLACE) 100 MG capsule Take 100 mg by mouth daily as needed for mild constipation.      . dorzolamide-timolol (COSOPT) 22.3-6.8 MG/ML ophthalmic solution Place 1 drop into the right eye 2  (two) times daily.      . furosemide (LASIX) 20 MG tablet Take 1 tablet (20 mg total) by mouth every morning.  30 tablet  3  . Guaifenesin-Codeine 200-10 MG/5ML LIQD Take 5 mLs by mouth 3 (three) times daily as needed.  1 Bottle  0  . ketoconazole (NIZORAL) 2 % cream Apply 1 application topically 2 (two) times daily.  60 g  0  . meloxicam (MOBIC) 15 MG tablet Take 15 mg by mouth daily as needed (arthritis pain).      . metoprolol (LOPRESSOR) 50 MG tablet Take 1 tablet (50 mg total) by mouth 2 (two) times daily.  180 tablet  3  . omeprazole (PRILOSEC) 20 MG capsule Take 20 mg by mouth 2 (two) times daily.       . promethazine-codeine (PHENERGAN WITH CODEINE) 6.25-10 MG/5ML syrup Take 5-10 mLs by mouth every 6 (six) hours as needed for cough.  120 mL  0  . ramipril (ALTACE) 10 MG capsule Take 1 capsule (10 mg total) by mouth daily.  90 capsule  3  . simvastatin (ZOCOR) 20 MG tablet Take 20 mg by mouth every evening.      . tolterodine (DETROL) 2 MG tablet Take 2 mg by mouth 2 (two) times daily.       Marland Kitchen zolpidem (AMBIEN) 10 MG tablet Take 10 mg by mouth at bedtime. For sleep       No current facility-administered medications for this visit.    Allergies  Avastin; Clindamycin/lincomycin; Novocain; and Sulfa antibiotics  Electrocardiogram:  SR rate 94 no acute ST/ T wave changes   Assessment and Plan

## 2013-09-25 NOTE — Patient Instructions (Signed)
Your physician recommends that you schedule a follow-up appointment in: AS NEEDED  Your physician recommends that you continue on your current medications as directed. Please refer to the Current Medication list given to you today.  

## 2013-09-25 NOTE — Assessment & Plan Note (Signed)
Continue current dose of diuretic venous disease and varicosities

## 2013-10-28 ENCOUNTER — Ambulatory Visit (INDEPENDENT_AMBULATORY_CARE_PROVIDER_SITE_OTHER): Payer: Medicare Other | Admitting: General Surgery

## 2013-10-28 ENCOUNTER — Encounter (INDEPENDENT_AMBULATORY_CARE_PROVIDER_SITE_OTHER): Payer: Self-pay | Admitting: General Surgery

## 2013-10-28 VITALS — BP 138/78 | HR 68 | Temp 98.3°F | Resp 14 | Ht 66.0 in | Wt 210.0 lb

## 2013-10-28 DIAGNOSIS — K319 Disease of stomach and duodenum, unspecified: Secondary | ICD-10-CM | POA: Diagnosis not present

## 2013-10-28 DIAGNOSIS — K3189 Other diseases of stomach and duodenum: Secondary | ICD-10-CM

## 2013-10-28 NOTE — Progress Notes (Addendum)
Patient ID: Melissa Murphy, female   DOB: [birth year], 78 y.o.   MRN: [id number]  Chief Complaint  Patient presents with  . Routine Post Op    HPI Melissa Murphy is a 78 y.o. female.  She returns once again, for a third visit, to discuss repair of her giant hiatal hernia. She apparently had some bronchitis in the winter. She states that she would now like to go ahead with elective scheduling of her hiatal hernia repair.  When I last saw her she had progressive leg swelling, left greater than right. Duplex scanning was negative for DVT. She does have a history of DVT in the past.  In terms of her GI symptoms, she really just complains of some early satiety but no vomiting or regurgitation. Denies any severe pain.No real weight loss.  I initially evaluated this woman in February 2014 upon referral by Dr. Erskine Emery. Her cardiologist is Dr. Jenkins Rouge. Dr. Tami Lin is her primary care physician.  In terms of her large hiatal hernia she has had an upper GI which shows organoaxial rotation. In March of 2014 she had ultrasound which was negative for gallstones. On 11/12/2012 she had an upper endoscopy which showed a large hiatal hernia with healthy mucosa but twisting and a volvulus was suspected. She's had a cardiac evaluation by Dr. Jenkins Rouge on 09/29/2012 and he stated that she would need to be on a clonidine patch in the perioperative period because her blood pressures are labile and because of her DVT we would be need to be very careful with Lovenox prophylaxis. On 06/12/2013 she had a CT of the abdomen for lower abdominal pain. The appendix was normal. The hiatal hernia was again noted was unchanged. I have reviewed and do not see any periumbilical hernia.This was apparently done as an outpatient. She's had a colonoscopy in January which was unremarkable.      Symptomatically she still has some early satiety but doesn't really have much in the way of reflux or water brash. She rarely  vomits. There is no pain when she swallows. There is no dysphagia. Just early satiety and she has to eat slowly.   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 Occasional bronchitis. She's never had a myocardial infarction. She is followed by Jenkins Rouge. She has surgery at age 85 through a large incision at the right inframammary fold above the costal margin. 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 Umbilical herniorrhaphy with mesh..  .  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 She is very pleasant and a signing interpreter is with her throughout today's encounter. Her husband is also here.. She has good comprehension and insight.   HPI  Past Medical History  Diagnosis Date  . GERD (gastroesophageal reflux disease)   . Hypertension   . Hyperlipidemia   . Glaucoma   . Urinary incontinence   . Edema   . Anemia   . Arthritis   . DVT (deep venous thrombosis)     in eye, and leg  . Osteoporosis   . Ulcer   . Hernia   . Deaf     Requires an interpreter  . Uterine polyp   . Hiatal hernia   . Sleep apnea   . Clotting disorder   . Thyroid disease     Past Surgical History  Procedure Laterality Date  . Cataract extraction  both eyes  . Goiter removed      age 63  . Xanthoma tumor removed    . Refractive surgery    . Dilatation & currettage/hysteroscopy with resectocope N/A 10/11/2012    Procedure: DILATATION & CURETTAGE/HYSTEROSCOPY WITH RESECTOCOPE;  Surgeon: Allena Katz, MD;  Location: [facility];  Service: Gynecology;  Laterality: N/A;  pt is deaf; please contact daughter Heide Spark [phone]  . Eye surgery    . Hernia repair      Family History  Problem Relation Age of Onset  . Esophageal cancer Neg Hx   . Rectal cancer Neg Hx   . Stomach cancer Neg Hx   . Cancer Mother     colon  . Stroke Mother   . Heart disease Father     70 yrs old  . Heart disease Brother     MI,   . Hernia  Brother   . Migraines Daughter   . Heart disease Paternal Grandmother     Social History History  Substance Use Topics  . Smoking status: Never Smoker   . Smokeless tobacco: Never Used  . Alcohol Use: No     Comment: occasionally    Allergies  Allergen Reactions  . Avastin [Bevacizumab] Nausea And Vomiting  . Clindamycin/Lincomycin Itching  . Novocain [Procaine] Other (See Comments)    seizures  . Sulfa Antibiotics Other (See Comments)    fever    Current Outpatient Prescriptions  Medication Sig Dispense Refill  . cloNIDine (CATAPRES) 0.1 MG tablet Take 0.1 mg by mouth 2 (two) times daily.      Marland Kitchen docusate sodium (COLACE) 100 MG capsule Take 100 mg by mouth daily as needed for mild constipation.      . dorzolamide-timolol (COSOPT) 22.3-6.8 MG/ML ophthalmic solution Place 1 drop into the right eye 2 (two) times daily.      . furosemide (LASIX) 20 MG tablet Take 1 tablet (20 mg total) by mouth every morning.  30 tablet  3  . meloxicam (MOBIC) 15 MG tablet Take 15 mg by mouth daily as needed (arthritis pain).      . metoprolol (LOPRESSOR) 50 MG tablet Take 1 tablet (50 mg total) by mouth 2 (two) times daily.  180 tablet  3  . omeprazole (PRILOSEC) 20 MG capsule Take 20 mg by mouth 2 (two) times daily.       . ramipril (ALTACE) 10 MG capsule Take 1 capsule (10 mg total) by mouth daily.  90 capsule  3  . simvastatin (ZOCOR) 20 MG tablet Take 20 mg by mouth every evening.      . tolterodine (DETROL) 2 MG tablet Take 2 mg by mouth 2 (two) times daily.       Marland Kitchen zolpidem (AMBIEN) 10 MG tablet Take 10 mg by mouth at bedtime. For sleep      . aspirin 81 MG tablet Take 81 mg by mouth every morning.        No current facility-administered medications for this visit.    Review of Systems Review of Systems  Constitutional: Negative for fever, chills and unexpected weight change.  HENT: Negative for congestion, hearing loss, sore throat, trouble swallowing and voice change.   Eyes:  Negative for visual disturbance.  Respiratory: Negative for cough and wheezing.   Cardiovascular: Negative for chest pain, palpitations and leg swelling.  Gastrointestinal: Negative for nausea, vomiting, abdominal pain, diarrhea, constipation, blood in stool, abdominal distention and anal bleeding.  Genitourinary: Negative for hematuria, vaginal bleeding  and difficulty urinating.  Musculoskeletal: Negative for arthralgias.  Skin: Negative for rash and wound.  Neurological: Negative for seizures, syncope and headaches.  Hematological: Negative for adenopathy. Does not bruise/bleed easily.  Psychiatric/Behavioral: Negative for confusion.    Blood pressure 138/78, pulse 68, temperature 98.3 F (36.8 C), temperature source Oral, resp. rate 14, height 5\' 6"  (1.676 m), weight 210 lb (95.255 kg).   Physical Exam   Constitutional: She is oriented to person, place, and time. She appears well-developed and well-nourished. No distress.  HENT:  Head: Normocephalic and atraumatic.  Nose: Nose normal.  Mouth/Throat: No oropharyngeal exudate.  Eyes: Conjunctivae and EOM are normal. Pupils are equal, round, and reactive to light. Left eye exhibits no discharge. No scleral icterus.  Neck: Neck supple. No JVD present. No tracheal deviation present. No thyromegaly present.  Thyroidectomy scar  Cardiovascular: Normal rate, regular rhythm, normal heart sounds and intact distal pulses.  No murmur heard.  Pulmonary/Chest: Effort normal and breath sounds normal. No respiratory distress. She has no wheezes. She has no rales. She exhibits no tenderness.  Long transverse inframammary scar. No palpable mass.  Abdominal: Soft. Bowel sounds are normal. She exhibits no distension and no mass. There is no tenderness. There is no rebound and no guarding.  Short transverse scar below her umbilicus. Protuberant lower abdomen. Somewhat obese. No obvious hernias. Palpable bilateral femoral pulses  Musculoskeletal: She  exhibits edema .  Bilateral lower extremity edema knee and foot. Left greater than right. No calf tenderness. No warmth. No cords. Appears chronic  Lymphadenopathy:  She has no cervical adenopathy.  Neurological: She is alert and oriented to person, place, and time. She exhibits normal muscle tone. Coordination normal.  Skin: Skin is warm. No rash noted. She is not diaphoretic. No erythema. No pallor.  Psychiatric: She has a normal mood and affect. Her behavior is normal. Judgment and thought content normal.   Data Reviewed Old records. Old imaging studies. Endoscopy report. Recent duplex of lower extremity veins.  Assessment    Giant hiatal hernia with organoaxial rotation. Currently minimally symptomatic. Natural history is high risk for volvulus and strangulation. Once again she is advised to have an elective operation.   Negative colonoscopy January 1610   History umbilical hernia repair with mesh. Question recurrence by CT scan. No recurrence by physical exam  History thyroidectomy for benign disease   History of right inframammary incision age 80, presumed benign subcutaneous tumor   Hypertension   History DVT left leg, 2008, on Coumadin for one year.   Chronic lower extremity edema, negative venous duplex ultrasound of lower extremities in November 2014.     Plan    Schedule for laparoscopic reduction and repair of her hiatal hernia and possible Nissen fundoplication.  I discussed the indications, details, techniques and numerous risks of the surgery with her. She knows she is at increased risk because of her age, cardiovascular disease, and potential pulmonary problems.She's aware of the risk of bleeding, infection, pneumothorax, Splenic injury, perforation with immediate or delayed recognition, gastroparesis, difficulty swallowing, cardiac pulmonary and thromboembolic problems. Even death.  She is aware that she'll have a drain most likely. She is aware that she'll need to  be in the hospital 3 days at least. She understands all these issues. All of her questions are answered. She agrees with this plan.  Post-operative clonidine patch. I will discuss the dosage of this with Dr. Jenkins Rouge, her cardiologist  She will need careful attention to pharmacologic DVT prophylaxis with  Lovenox          Edsel Petrin. Dalbert Batman, M.D., [facility] General and Minimally invasive Surgery Breast and Colorectal Surgery Office:   [phone] Pager:   [phone]  10/28/2013, 5:18 PM

## 2013-10-28 NOTE — Patient Instructions (Signed)
As we have discussed in the past, you have a large hiatal hernia.  This means that most of your stomach has slid up behind your heart and is twisted. There is a risk that you could get a blockage from this. There is also a small risk that the blood supply to the stomach could be cut off which would be life-threatening.  Surgery has its own risks, which we have discussed. It is in your best interest to have an elective operation before something  bad happens.  You will be scheduled for laparoscopic repair of your hiatal hernia, possible open repair      Hiatal Hernia A hiatal hernia occurs when a part of the stomach slides above the diaphragm. The diaphragm is the thin muscle separating the belly (abdomen) from the chest. A hiatal hernia can be something you are born with or develop over time. Hiatal hernias may allow stomach acid to flow back into your esophagus, the tube which carries food from your mouth to your stomach. If this acid causes problems it is called GERD (gastro-esophageal reflux disease).  SYMPTOMS  Common symptoms of GERD are heartburn (burning in your chest). This is worse when lying down or bending over. It may also cause belching and indigestion. Some of the things which make GERD worse are:  Increased weight pushes on stomach making acid rise more easily.  Smoking markedly increases acid production.  Alcohol decreases lower esophageal sphincter pressure (valve between stomach and esophagus), allowing acid from stomach into esophagus.  Late evening meals and going to bed with a full stomach increases pressure.  Anything that causes an increase in acid production.  Lower esophageal sphincter incompetence. DIAGNOSIS  Hiatal hernia is often diagnosed with x-rays of your stomach and small bowel. This is called an UGI (upper gastrointestinal x-ray). Sometimes a gastroscopic procedure is done. This is a procedure where your caregiver uses a flexible instrument to look into  the stomach and small bowel. HOME CARE INSTRUCTIONS   Try to achieve and maintain an ideal body weight.  Avoid drinking alcoholic beverages.  Stop smoking.  Put the head of your bed on 4 to 6 inch blocks. This will keep your head and esophagus higher than your stomach. If you cannot use blocks, sleep with several pillows under your head and shoulders.  Over-the-counter medications will decrease acid production. Your caregiver can also prescribe medications for this. Take as directed.  1/2 to 1 teaspoon of an antacid taken every hour while awake, with meals and at bedtime, will neutralize acid.  Do not take aspirin, ibuprofen (Advil or Motrin), or other nonsteroidal anti-inflammatory drugs.  Do not wear tight clothing around your chest or stomach.  Eat smaller meals and eat more frequently. This keeps your stomach from getting too full. Eat slowly.  Do not lie down for 2 or 3 hours after eating. Do not eat or drink anything 1 to 2 hours before going to bed.  Avoid caffeine beverages (colas, coffee, cocoa, tea), fatty foods, citrus fruits and all other foods and drinks that contain acid and that seem to increase the problems.  Avoid bending over, especially after eating. Also avoid straining during bowel movements or when urinating or lifting things. Anything that increases the pressure in your belly increases the amount of acid that may be pushed up into your esophagus. SEEK IMMEDIATE MEDICAL CARE IF:  There is change in location (pain in arms, neck, jaw, teeth or back) of your pain, or the pain is getting  worse.  You also experience nausea, vomiting, sweating (diaphoresis), or shortness of breath.  You develop continual vomiting, vomit blood or coffee ground material, have bright red blood in your stools, or have black tarry stools. Some of these symptoms could signal other problems such as heart disease. MAKE SURE YOU:   Understand these instructions.  Monitor your  condition.  Contact your caregiver if you are not doing well or are getting worse. Document Released: 10/21/2003 Document Revised: 10/23/2011 Document Reviewed: 07/31/2005 [facility] Patient Information 2014 [place], [place].

## 2013-11-04 ENCOUNTER — Other Ambulatory Visit: Payer: Self-pay | Admitting: Internal Medicine

## 2013-11-06 ENCOUNTER — Other Ambulatory Visit: Payer: Self-pay | Admitting: Internal Medicine

## 2013-11-07 ENCOUNTER — Other Ambulatory Visit: Payer: Self-pay | Admitting: Family Medicine

## 2013-11-10 ENCOUNTER — Other Ambulatory Visit: Payer: Self-pay | Admitting: Emergency Medicine

## 2013-11-11 ENCOUNTER — Other Ambulatory Visit: Payer: Self-pay | Admitting: Internal Medicine

## 2013-11-17 DIAGNOSIS — M79609 Pain in unspecified limb: Secondary | ICD-10-CM | POA: Diagnosis not present

## 2013-11-17 DIAGNOSIS — I8310 Varicose veins of unspecified lower extremity with inflammation: Secondary | ICD-10-CM | POA: Diagnosis not present

## 2013-11-21 ENCOUNTER — Other Ambulatory Visit: Payer: Self-pay | Admitting: Internal Medicine

## 2013-11-21 MED ORDER — ZOLPIDEM 10 MG TABLET
1.0000 | ORAL_TABLET | Freq: Every day | ORAL | Status: DC
Start: 2013-11-21 — End: 2014-06-18

## 2013-11-21 NOTE — Telephone Encounter (Signed)
Pharmacy fax request  Last fill date 10/22/13.    Glorian Mcdonell, LVN

## 2013-11-24 DIAGNOSIS — M79609 Pain in unspecified limb: Secondary | ICD-10-CM | POA: Diagnosis not present

## 2013-11-24 DIAGNOSIS — I8310 Varicose veins of unspecified lower extremity with inflammation: Secondary | ICD-10-CM | POA: Diagnosis not present

## 2013-11-25 ENCOUNTER — Telehealth: Payer: Self-pay | Admitting: Internal Medicine

## 2013-11-25 NOTE — Telephone Encounter (Signed)
A user error has taken place: Encounter opened in error, closed for administrative reasons.

## 2013-12-02 NOTE — Patient Instructions (Signed)
Your procedure is scheduled on:  12/08/13  MONDAY  Report to [facility] at  [place]     AM.  Call this number if you have problems the morning of surgery: [place]   Do not eat food  Or drink :After Midnight.SUNDAY NIGHT   Take these medicines the morning of surgery with A SIP OF WATER: METOPROLOL, Clonidine, Omeprazole   .  Contacts, dentures or partial plates, or metal hairpins  can not be worn to surgery. Your family will be responsible for glasses, dentures, hearing aides while you are in surgery  Leave suitcase in the car. After surgery it may be brought to your room.  For patients admitted to the hospital, checkout time is 11:00 AM day of  discharge.                DO NOT WEAR JEWELRY, LOTIONS, POWDERS, OR PERFUMES.  WOMEN-- DO NOT SHAVE LEGS OR UNDERARMS FOR 48 HOURS BEFORE SHOWERS. MEN MAY SHAVE FACE.  Patients discharged the day of surgery will not be allowed to drive home. IF going home the day of surgery, you must have a driver and someone to stay with you for the first 24 hours                                            [facility] - Preparing for Surgery Before surgery, you can play an important role.  Because skin is not sterile, your skin needs to be as free of germs as possible.  You can reduce the number of germs on your skin by washing with CHG (chlorahexidine gluconate) soap before surgery.  CHG is an antiseptic cleaner which kills germs and bonds with the skin to continue killing germs even after washing. Please DO NOT use if you have an allergy to CHG or antibacterial soaps.  If your skin becomes reddened/irritated stop using the CHG and inform your nurse when you arrive at Short Stay. Do not shave (including legs and underarms) for at least 48 hours prior to the first CHG shower.  You may shave your face. Please follow these instructions carefully:  1.  Shower with CHG Soap the night before  surgery and the  morning of Surgery.  2.  If you choose to wash your hair, wash your hair first as usual with your  normal  shampoo.  3.  After you shampoo, rinse your hair and body thoroughly to remove the  shampoo.                           4.  Use CHG as you would any other liquid soap.  You can apply chg directly  to the skin and wash                       Gently with a scrungie or clean washcloth.  5.  Apply the CHG Soap to your body ONLY FROM THE NECK DOWN.   Do not use on open                           Wound or open sores. Avoid contact with eyes, ears mouth and genitals (private  parts).                        Genitals (private parts) with your normal soap.             6.  Wash thoroughly, paying special attention to the area where your surgery  will be performed.  7.  Thoroughly rinse your body with warm water from the neck down.  8.  DO NOT shower/wash with your normal soap after using and rinsing off  the CHG Soap.                9.  Pat yourself dry with a clean towel.            10.  Wear clean pajamas.            11.  Place clean sheets on your bed the night of your first shower and do not  sleep with pets. Day of Surgery : Do not apply any lotions/deodorants the morning of surgery.  Please wear clean clothes to the hospital/surgery center.  FAILURE TO FOLLOW THESE INSTRUCTIONS MAY RESULT IN THE CANCELLATION OF YOUR SURGERY PATIENT SIGNATURE_________________________________  NURSE SIGNATURE__________________________________

## 2013-12-02 NOTE — Progress Notes (Signed)
Chest x ray, ekg 1/15, lov Dr Johnsie Cancel 2/15 EPIC

## 2013-12-02 NOTE — Progress Notes (Signed)
Sleep study 5/14 under media tab

## 2013-12-03 ENCOUNTER — Encounter (HOSPITAL_COMMUNITY): Payer: Self-pay | Admitting: Pharmacy Technician

## 2013-12-03 ENCOUNTER — Ambulatory Visit (HOSPITAL_COMMUNITY)
Admission: RE | Admit: 2013-12-03 | Discharge: 2013-12-03 | Disposition: A | Discharge status: Home / Self Care | Payer: Medicare Other | Source: Ambulatory Visit | Attending: General Surgery | Admitting: General Surgery

## 2013-12-03 ENCOUNTER — Encounter (HOSPITAL_COMMUNITY)
Admission: RE | Admit: 2013-12-03 | Discharge: 2013-12-03 | Disposition: A | Discharge status: Home / Self Care | Payer: Medicare Other | Source: Ambulatory Visit | Attending: General Surgery | Admitting: General Surgery

## 2013-12-03 ENCOUNTER — Encounter (HOSPITAL_COMMUNITY): Payer: Self-pay

## 2013-12-03 DIAGNOSIS — Z01812 Encounter for preprocedural laboratory examination: Secondary | ICD-10-CM | POA: Diagnosis not present

## 2013-12-03 DIAGNOSIS — Z01810 Encounter for preprocedural cardiovascular examination: Secondary | ICD-10-CM | POA: Diagnosis not present

## 2013-12-03 DIAGNOSIS — I10 Essential (primary) hypertension: Secondary | ICD-10-CM | POA: Diagnosis not present

## 2013-12-03 DIAGNOSIS — Z01818 Encounter for other preprocedural examination: Secondary | ICD-10-CM | POA: Diagnosis not present

## 2013-12-03 DIAGNOSIS — K449 Diaphragmatic hernia without obstruction or gangrene: Secondary | ICD-10-CM | POA: Insufficient documentation

## 2013-12-03 LAB — CBC WITH DIFFERENTIAL/PLATELET
BASOS PCT: 0 % (ref 0–1)
Basophils Absolute: 0 10*3/uL (ref 0.0–0.1)
Eosinophils Absolute: 0.1 10*3/uL (ref 0.0–0.7)
Eosinophils Relative: 2 % (ref 0–5)
HEMATOCRIT: 41.7 % (ref 36.0–46.0)
HEMOGLOBIN: 14 g/dL (ref 12.0–15.0)
LYMPHS PCT: 47 % — AB (ref 12–46)
Lymphs Abs: 2.3 10*3/uL (ref 0.7–4.0)
MCH: 30.3 pg (ref 26.0–34.0)
MCHC: 33.6 g/dL (ref 30.0–36.0)
MCV: 90.3 fL (ref 78.0–100.0)
MONO ABS: 0.4 10*3/uL (ref 0.1–1.0)
MONOS PCT: 7 % (ref 3–12)
Neutro Abs: 2.2 10*3/uL (ref 1.7–7.7)
Neutrophils Relative %: 44 % (ref 43–77)
Platelets: 182 10*3/uL (ref 150–400)
RBC: 4.62 MIL/uL (ref 3.87–5.11)
RDW: 13.1 % (ref 11.5–15.5)
WBC: 5 10*3/uL (ref 4.0–10.5)

## 2013-12-03 LAB — URINALYSIS, ROUTINE W REFLEX MICROSCOPIC
BILIRUBIN URINE: NEGATIVE
Glucose, UA: NEGATIVE mg/dL
Hgb urine dipstick: NEGATIVE
KETONES UR: NEGATIVE mg/dL
LEUKOCYTES UA: NEGATIVE
Nitrite: NEGATIVE
PH: 5 (ref 5.0–8.0)
PROTEIN: NEGATIVE mg/dL
Specific Gravity, Urine: 1.005 (ref 1.005–1.030)
UROBILINOGEN UA: 0.2 mg/dL (ref 0.0–1.0)

## 2013-12-03 LAB — COMPREHENSIVE METABOLIC PANEL
ALT: 15 U/L (ref 0–35)
AST: 22 U/L (ref 0–37)
Albumin: 3.7 g/dL (ref 3.5–5.2)
Alkaline Phosphatase: 77 U/L (ref 39–117)
BILIRUBIN TOTAL: 0.3 mg/dL (ref 0.3–1.2)
BUN: 13 mg/dL (ref 6–23)
CALCIUM: 10.3 mg/dL (ref 8.4–10.5)
CO2: 29 meq/L (ref 19–32)
CREATININE: 0.72 mg/dL (ref 0.50–1.10)
Chloride: 101 mEq/L (ref 96–112)
GFR calc Af Amer: >90 mL/min (ref >90)
GFR, EST NON AFRICAN AMERICAN: 82 mL/min — AB (ref >90)
Glucose, Bld: 97 mg/dL (ref 70–99)
Potassium: 4.1 mEq/L (ref 3.7–5.3)
Sodium: 140 mEq/L (ref 137–147)
Total Protein: 7.5 g/dL (ref 6.0–8.3)

## 2013-12-03 LAB — PROTIME-INR
INR: 0.99 (ref 0.00–1.49)
PROTHROMBIN TIME: 12.9 s (ref 11.6–15.2)

## 2013-12-03 LAB — APTT: aPTT: 28 seconds (ref 24–37)

## 2013-12-03 NOTE — Progress Notes (Signed)
PST visit- HR remains between 44-46-42. Pt asymptomatic- not dizzy, unsteady or any complaints other than nervous about visit.  EKG repeated with lab draws at 1551.  I spoke with ALLISON CMA at Dr [facility] office asking her to have provider review this and her medications. Ebony Hail verified she had daughter Roderic Palau telephone number should they need to contact her.   HR at Dr Mariana Arn pre op visit 2/15 was 63 and per his note, meds to be followed by PCP

## 2013-12-03 NOTE — Progress Notes (Signed)
Faxed prior note regarding EKG to Dr Dalbert Batman via [facility]

## 2013-12-04 ENCOUNTER — Telehealth: Payer: Self-pay | Admitting: Family Medicine

## 2013-12-04 NOTE — H&P (Signed)
Melissa Murphy    MRN:  [id number]   Description: 78 year old female  Provider: Ernestene Mention, MD  Department: [facility]             Diagnoses      Organoaxial gastric volvulus    -  Primary      537.89              Current Vitals Most recent update: 10/28/2013  3:44 PM by Sharlyne Pacas, CMA      BP Pulse Temp(Src) Resp Ht Wt      138/78 68 98.3 F (36.8 C) (Oral) 14  (1.676 m) 210 lb (95.255 kg)      BMI 33.91 kg/m2                      History and Physical   Ernestene Mention, MD      Status: Addendum            Patient ID: Melissa Murphy, female   DOB: [birth year], 78 y.o.   MRN: [id number]             Note: This dictation was prepared with Dragon/digital dictation along with [facility] technology. Any transcriptional errors that result from this process are unintentional.  HPI Melissa Murphy is a 78 y.o. female.  She returns once again, for a third visit, to discuss repair of her giant hiatal hernia. She apparently had some bronchitis in the winter. She states that she would now like to go ahead with elective scheduling of her hiatal hernia repair.   When I last saw her she had progressive leg swelling, left greater than right. Duplex scanning was negative for DVT. She does have a history of DVT in the past.   In terms of her GI symptoms, she really just complains of some early satiety but no vomiting or regurgitation. Denies any severe pain.No real weight loss.   I initially evaluated this woman in February 2014 upon referral by Dr. Melvia Heaps. Her cardiologist is Dr. Charlton Haws. Dr. Ellamae Sia is her primary care physician.   In terms of her large hiatal hernia she has had an upper GI which shows organoaxial rotation. In March of 2014 she had ultrasound which was negative for gallstones. On 11/12/2012 she had an upper endoscopy which showed a large hiatal hernia with healthy mucosa but twisting and a volvulus was  suspected. She's had a cardiac evaluation by Dr. Charlton Haws on 09/29/2012 and he stated that she would need to be on a clonidine patch in the perioperative period because her blood pressures are labile and because of her DVT we would be need to be very careful with Lovenox prophylaxis. On 06/12/2013 she had a CT of the abdomen for lower abdominal pain. The appendix was normal. The hiatal hernia was again noted was unchanged. I have reviewed and do not see any periumbilical hernia.This was apparently done as an outpatient. She's had a colonoscopy in January which was unremarkable.       Symptomatically she still has some early satiety but doesn't really have much in the way of reflux or water brash. She rarely vomits. There is no pain when she swallows. There is no dysphagia. Just early satiety and she has to eat slowly.    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 Occasional bronchitis. She's never had a myocardial infarction. She is  followed by [facility]. She has surgery at age 85 through a large incision at the right inframammary fold above the costal margin. 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 Umbilical herniorrhaphy with mesh..   .   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 She is very pleasant and a signing interpreter is with her throughout today's encounter. Her husband is also here.. She has good comprehension and insight.        Past Medical History   Diagnosis  Date   .  GERD (gastroesophageal reflux disease)     .  Hypertension     .  Hyperlipidemia     .  Glaucoma     .  Urinary incontinence     .  Edema     .  Anemia     .  Arthritis     .  DVT (deep venous thrombosis)         in eye, and leg   .  Osteoporosis     .  Ulcer     .  Hernia     .  Deaf         Requires an interpreter   .  Uterine polyp     .  Hiatal hernia     .  Sleep apnea     .  Clotting disorder     .  Thyroid disease            Past Surgical History   Procedure  Laterality  Date   .  Cataract extraction           both eyes   .  Goiter removed           age 60   .  Xanthoma tumor removed       .  Refractive surgery       .  Dilatation & currettage/hysteroscopy with resectocope  N/A  10/11/2012       Procedure: DILATATION & CURETTAGE/HYSTEROSCOPY WITH RESECTOCOPE;  Surgeon: Allena Katz, MD;  Location: [facility];  Service: Gynecology;  Laterality: N/A;  pt is deaf; please contact daughter Heide Spark [phone]   .  Eye surgery       .  Hernia repair             Family History   Problem  Relation  Age of Onset   .  Esophageal cancer  Neg Hx     .  Rectal cancer  Neg Hx     .  Stomach cancer  Neg Hx     .  Cancer  Mother         colon   .  Stroke  Mother     .  Heart disease  Father         58 yrs old   .  Heart disease  Brother         MI,    .  Hernia  Brother     .  Migraines  Daughter     .  Heart disease  Paternal Grandmother          Social History History   Substance Use Topics   .  Smoking status:  Never Smoker    .  Smokeless tobacco:  Never Used   .  Alcohol Use:  No         Comment: occasionally  Allergies   Allergen  Reactions   .  Avastin [Bevacizumab]  Nausea And Vomiting   .  Clindamycin/Lincomycin  Itching   .  Novocain [Procaine]  Other (See Comments)       seizures   .  Sulfa Antibiotics  Other (See Comments)       fever         Current Outpatient Prescriptions   Medication  Sig  Dispense  Refill   .  cloNIDine (CATAPRES) 0.1 MG tablet  Take 0.1 mg by mouth 2 (two) times daily.         Marland Kitchen  docusate sodium (COLACE) 100 MG capsule  Take 100 mg by mouth daily as needed for mild constipation.         .  dorzolamide-timolol (COSOPT) 22.3-6.8 MG/ML ophthalmic solution  Place 1 drop into the right eye 2 (two) times daily.         .  furosemide (LASIX) 20 MG tablet  Take 1 tablet (20 mg total) by mouth every morning.   30 tablet   3   .  meloxicam  (MOBIC) 15 MG tablet  Take 15 mg by mouth daily as needed (arthritis pain).         .  metoprolol (LOPRESSOR) 50 MG tablet  Take 1 tablet (50 mg total) by mouth 2 (two) times daily.   180 tablet   3   .  omeprazole (PRILOSEC) 20 MG capsule  Take 20 mg by mouth 2 (two) times daily.          .  ramipril (ALTACE) 10 MG capsule  Take 1 capsule (10 mg total) by mouth daily.   90 capsule   3   .  simvastatin (ZOCOR) 20 MG tablet  Take 20 mg by mouth every evening.         .  tolterodine (DETROL) 2 MG tablet  Take 2 mg by mouth 2 (two) times daily.          Marland Kitchen  zolpidem (AMBIEN) 10 MG tablet  Take 10 mg by mouth at bedtime. For sleep         .  aspirin 81 MG tablet  Take 81 mg by mouth every morning.                   Review of Systems  Constitutional: Negative for fever, chills and unexpected weight change.  HENT: Negative for congestion, hearing loss, sore throat, trouble swallowing and voice change.   Eyes: Negative for visual disturbance.  Respiratory: Negative for cough and wheezing.   Cardiovascular: Negative for chest pain, palpitations and leg swelling.  Gastrointestinal: Negative for nausea, vomiting, abdominal pain, diarrhea, constipation, blood in stool, abdominal distention and anal bleeding.  Genitourinary: Negative for hematuria, vaginal bleeding and difficulty urinating.  Musculoskeletal: Negative for arthralgias.  Skin: Negative for rash and wound.  Neurological: Negative for seizures, syncope and headaches.  Hematological: Negative for adenopathy. Does not bruise/bleed easily.  Psychiatric/Behavioral: Negative for confusion.      Blood pressure 138/78, pulse 68, temperature 98.3 F (36.8 C), temperature source Oral, resp. rate 14, height 5\' 6"  (1.676 m), weight 210 lb (95.255 kg).     Physical Exam   Constitutional: She is oriented to person, place, and time. She appears well-developed and well-nourished. No distress.   HENT:   Head: Normocephalic and atraumatic.    Nose: Nose normal.   Mouth/Throat: No oropharyngeal exudate.   Eyes: Conjunctivae and EOM are normal. Pupils  are equal, round, and reactive to light. Left eye exhibits no discharge. No scleral icterus.   Neck: Neck supple. No JVD present. No tracheal deviation present. No thyromegaly present.  Thyroidectomy scar   Cardiovascular: Normal rate, regular rhythm, normal heart sounds and intact distal pulses.   No murmur heard.   Pulmonary/Chest: Effort normal and breath sounds normal. No respiratory distress. She has no wheezes. She has no rales. She exhibits no tenderness.  Long transverse inframammary scar. No palpable mass.   Abdominal: Soft. Bowel sounds are normal. She exhibits no distension and no mass. There is no tenderness. There is no rebound and no guarding.  Short transverse scar below her umbilicus. Protuberant lower abdomen. Somewhat obese. No obvious hernias. Palpable bilateral femoral pulses  Musculoskeletal: She exhibits edema .  Bilateral lower extremity edema knee and foot. Left greater than right. No calf tenderness. No warmth. No cords. Appears chronic  Lymphadenopathy:  She has no cervical adenopathy.  Neurological: She is alert and oriented to person, place, and time. She exhibits normal muscle tone. Coordination normal.   Skin: Skin is warm. No rash noted. She is not diaphoretic. No erythema. No pallor.  Psychiatric: She has a normal mood and affect. Her behavior is normal. Judgment and thought content normal.    Data Reviewed Old records. Old imaging studies. Endoscopy report. Recent duplex of lower extremity veins.   Assessment     Giant hiatal hernia with organoaxial rotation. Currently minimally symptomatic. Natural history is high risk for volvulus and strangulation. Once again she is advised to have an elective operation.    Negative colonoscopy January 1610    History umbilical hernia repair with mesh. Question recurrence by CT scan. No recurrence by  physical exam   History thyroidectomy for benign disease    History of right inframammary incision age 84, presumed benign subcutaneous tumor    Hypertension    History DVT left leg, 2008, on Coumadin for one year.    Chronic lower extremity edema, negative venous duplex ultrasound of lower extremities in November 2014.       Plan    Schedule for laparoscopic reduction and repair of her hiatal hernia and possible Nissen fundoplication.   I discussed the indications, details, techniques and numerous risks of the surgery with her. She knows she is at increased risk because of her age, cardiovascular disease, and potential pulmonary problems.She's aware of the risk of bleeding, infection, pneumothorax, Splenic injury, perforation with immediate or delayed recognition, gastroparesis, difficulty swallowing, cardiac pulmonary and thromboembolic problems. Even death.  She is aware that she'll have a drain most likely. She is aware that she'll need to be in the hospital 3 days at least. She understands all these issues. All of her questions are answered. She agrees with this plan.   Post-operative clonidine patch. I will discuss the dosage of this with Dr. Jenkins Rouge, her cardiologist   She will need careful attention to pharmacologic DVT prophylaxis with Lovenox                Edsel Petrin. Dalbert Batman, M.D., [facility] General and Minimally invasive Surgery Breast and Colorectal Surgery Office:   [phone] Pager:   [phone]

## 2013-12-04 NOTE — Telephone Encounter (Signed)
Magda Paganini RN at [facility] pre surgery testing called regarding patient heart rate running 42-44. On her EKG yesterday HR was 40. Her BP was 140/100. She is on 3 BP meds and maybe that is causing drop in HR. She contacted [place] so we could decide if she needed f/u. Discussed with Charise Carwin and he advised patient needs to RTC before surgery. Called Suzie patient interpreter and discussed and and advised RTC. She voiced understanding and patient surgery is on Monday. She will bring patient in Friday or Saturday.

## 2013-12-07 ENCOUNTER — Ambulatory Visit (INDEPENDENT_AMBULATORY_CARE_PROVIDER_SITE_OTHER): Payer: Medicare Other | Admitting: Family Medicine

## 2013-12-07 VITALS — BP 128/78 | HR 60 | Temp 98.1°F | Resp 20 | Ht 60.0 in | Wt 204.6 lb

## 2013-12-07 DIAGNOSIS — G4733 Obstructive sleep apnea (adult) (pediatric): Secondary | ICD-10-CM | POA: Diagnosis not present

## 2013-12-07 DIAGNOSIS — I10 Essential (primary) hypertension: Secondary | ICD-10-CM | POA: Diagnosis not present

## 2013-12-07 DIAGNOSIS — Z9989 Dependence on other enabling machines and devices: Secondary | ICD-10-CM

## 2013-12-07 NOTE — Progress Notes (Signed)
Chief Complaint:  Chief Complaint  Patient presents with  . BP recheck    pt stated that she is scheduled for hernia surgery tomorrow and Dr. Dalbert Batman wanted her to have BP rechecked.     HPI: Melissa Murphy is a 78 y.o. female who is here for : HTN recheck having hernia repair surgery tomorrow She has not been on CPAP since she felt dizzy after she got tested at the sleep center so she stopped using it and left it in her car trunk. She has had no other sxs. Denis CP, SOB, dizziness.  Forgot CPAP in her trunk and son has it, he is on a business trip, borrowed her car to go to [place] for the week.   Past Medical History  Diagnosis Date  . GERD (gastroesophageal reflux disease)   . Hypertension   . Hyperlipidemia   . Glaucoma   . Urinary incontinence   . Edema   . Anemia   . Arthritis   . DVT (deep venous thrombosis)     in eye, and leg  . Osteoporosis   . Ulcer   . Hernia   . Deaf     Requires an interpreter  . Uterine polyp   . Hiatal hernia   . Sleep apnea   . Clotting disorder   . Thyroid disease    Past Surgical History  Procedure Laterality Date  . Cataract extraction      both eyes  . Goiter removed      age 69  . Xanthoma tumor removed    . Refractive surgery    . Dilatation & currettage/hysteroscopy with resectocope N/A 10/11/2012    Procedure: DILATATION & CURETTAGE/HYSTEROSCOPY WITH RESECTOCOPE;  Surgeon: Allena Katz, MD;  Location: [facility];  Service: Gynecology;  Laterality: N/A;  pt is deaf; please contact daughter Heide Spark [phone]  . Eye surgery    . Hernia repair    . Tonsillectomy     History   Social History  . Marital Status: Married    Spouse Name: N/A    Number of Children: 3  . Years of Education: Grad    Occupational History  . Teacher- Retired    Social History Main Topics  . Smoking status: Never Smoker   . Smokeless tobacco: Never Used  . Alcohol Use: No     Comment: occasionally  . Drug Use: No  .  Sexual Activity: None   Other Topics Concern  . None   Social History Narrative  . None   Family History  Problem Relation Age of Onset  . Esophageal cancer Neg Hx   . Rectal cancer Neg Hx   . Stomach cancer Neg Hx   . Cancer Mother     colon  . Stroke Mother   . Heart disease Father     43 yrs old  . Heart disease Brother     MI,   . Hernia Brother   . Migraines Daughter   . Heart disease Paternal Grandmother    Allergies  Allergen Reactions  . Avastin [Bevacizumab] Nausea And Vomiting  . Clindamycin/Lincomycin Itching  . Novocain [Procaine] Other (See Comments)    seizures  . Sulfa Antibiotics Other (See Comments)    fever   Prior to Admission medications   Medication Sig Start Date End Date Taking? Authorizing Provider  cloNIDine (CATAPRES) 0.1 MG tablet Take 0.1 mg by mouth 2 (two) times daily.   Yes Historical Provider,  MD  docusate sodium (COLACE) 100 MG capsule Take 100 mg by mouth daily as needed for mild constipation.   Yes Historical Provider, MD  dorzolamide-timolol (COSOPT) 22.3-6.8 MG/ML ophthalmic solution Place 1 drop into the right eye 2 (two) times daily.   Yes Historical Provider, MD  furosemide (LASIX) 20 MG tablet Take 1 tablet (20 mg total) by mouth every morning. 09/03/13  Yes Robyn Haber, MD  metoprolol (LOPRESSOR) 50 MG tablet Take 1 tablet (50 mg total) by mouth 2 (two) times daily. 04/28/13  Yes Leandrew Koyanagi, MD  omeprazole (PRILOSEC) 20 MG capsule Take 20 mg by mouth 2 (two) times daily.  09/30/12  Yes [place], PA-C  ramipril (ALTACE) 10 MG capsule Take 10 mg by mouth every morning.   Yes Historical Provider, MD  simvastatin (ZOCOR) 20 MG tablet Take 20 mg by mouth every evening.   Yes Historical Provider, MD  tolterodine (DETROL) 2 MG tablet Take 2 mg by mouth 2 (two) times daily.    Yes Historical Provider, MD  zolpidem (AMBIEN) 10 MG tablet Take 10 mg by mouth at bedtime. For sleep   Yes Historical Provider, MD  aspirin 81 MG  tablet Take 81 mg by mouth every morning.     Historical Provider, MD     ROS: The patient denies fevers, chills, night sweats, unintentional weight loss, chest pain, palpitations, wheezing, dyspnea on exertion, nausea, vomiting, abdominal pain, dysuria, hematuria, melena, numbness, weakness, or tingling.   All other systems have been reviewed and were otherwise negative with the exception of those mentioned in the HPI and as above.    PHYSICAL EXAM: Filed Vitals:   12/07/13 1608  BP: 128/78  Pulse: 60  Temp: 98.1 F (36.7 C)  Resp: 20   Filed Vitals:   12/07/13 1608  Height: 5' (1.524 m)  Weight: 204 lb 9.6 oz (92.806 kg)   Body mass index is 39.96 kg/(m^2).  General: Alert, no acute distress HEENT:  Normocephalic, atraumatic, oropharynx patent. EOMI, PERRLA Cardiovascular:  Regular rate and rhythm, no rubs murmurs or gallops.  No Carotid bruits, radial pulse intact. No pedal edema.  Respiratory: Clear to auscultation bilaterally.  No wheezes, rales, or rhonchi.  No cyanosis, no use of accessory musculature GI: No organomegaly, abdomen is soft and non-tender, positive bowel sounds.  No masses. Skin: No rashes. Neurologic: Facial musculature symmetric. Psychiatric: Patient is appropriate throughout our interaction. Lymphatic: No cervical lymphadenopathy Musculoskeletal: Gait intact.   LABS: Results for orders placed during the hospital encounter of 12/03/13  APTT      Result Value Ref Range   aPTT 28  24 - 37 seconds  CBC WITH DIFFERENTIAL      Result Value Ref Range   WBC 5.0  4.0 - 10.5 K/uL   RBC 4.62  3.87 - 5.11 MIL/uL   Hemoglobin 14.0  12.0 - 15.0 g/dL   HCT 41.7  36.0 - 46.0 %   MCV 90.3  78.0 - 100.0 fL   MCH 30.3  26.0 - 34.0 pg   MCHC 33.6  30.0 - 36.0 g/dL   RDW 13.1  11.5 - 15.5 %   Platelets 182  150 - 400 K/uL   Neutrophils Relative % 44  43 - 77 %   Neutro Abs 2.2  1.7 - 7.7 K/uL   Lymphocytes Relative 47 (*) 12 - 46 %   Lymphs Abs 2.3  0.7 - 4.0  K/uL   Monocytes Relative 7  3 - 12 %  Monocytes Absolute 0.4  0.1 - 1.0 K/uL   Eosinophils Relative 2  0 - 5 %   Eosinophils Absolute 0.1  0.0 - 0.7 K/uL   Basophils Relative 0  0 - 1 %   Basophils Absolute 0.0  0.0 - 0.1 K/uL  COMPREHENSIVE METABOLIC PANEL      Result Value Ref Range   Sodium 140  137 - 147 mEq/L   Potassium 4.1  3.7 - 5.3 mEq/L   Chloride 101  96 - 112 mEq/L   CO2 29  19 - 32 mEq/L   Glucose, Bld 97  70 - 99 mg/dL   BUN 13  6 - 23 mg/dL   Creatinine, Ser 0.72  0.50 - 1.10 mg/dL   Calcium 10.3  8.4 - 10.5 mg/dL   Total Protein 7.5  6.0 - 8.3 g/dL   Albumin 3.7  3.5 - 5.2 g/dL   AST 22  0 - 37 U/L   ALT 15  0 - 35 U/L   Alkaline Phosphatase 77  39 - 117 U/L   Total Bilirubin 0.3  0.3 - 1.2 mg/dL   GFR calc non Af Amer 82 (*) >90 mL/min   GFR calc Af Amer >90  >90 mL/min  PROTIME-INR      Result Value Ref Range   Prothrombin Time 12.9  11.6 - 15.2 seconds   INR 0.99  0.00 - 1.49  URINALYSIS, ROUTINE W REFLEX MICROSCOPIC      Result Value Ref Range   Color, Urine YELLOW  YELLOW   APPearance CLEAR  CLEAR   Specific Gravity, Urine 1.005  1.005 - 1.030   pH 5.0  5.0 - 8.0   Glucose, UA NEGATIVE  NEGATIVE mg/dL   Hgb urine dipstick NEGATIVE  NEGATIVE   Bilirubin Urine NEGATIVE  NEGATIVE   Ketones, ur NEGATIVE  NEGATIVE mg/dL   Protein, ur NEGATIVE  NEGATIVE mg/dL   Urobilinogen, UA 0.2  0.0 - 1.0 mg/dL   Nitrite NEGATIVE  NEGATIVE   Leukocytes, UA NEGATIVE  NEGATIVE     EKG/XRAY:   Primary read interpreted by Dr. Marin Comment at [facility].   ASSESSMENT/PLAN: Encounter Diagnoses  Name Primary?  . HTN (hypertension) Yes  . OSA on CPAP    HTN well controlled, continue with medications IF needed we can increase clonidine or Lasix, advise to get another BP machine to measure at home  Forgot CPAP in car and her son borrowed her car, she will need CPAP in hospital when she goes for hernai repair tomorrow F/u prn   Gross sideeffects, risk and benefits, and  alternatives of medications d/w patient. Patient is aware that all medications have potential sideeffects and we are unable to predict every sideeffect or drug-drug interaction that may occur.  Glenford Bayley, DO 12/07/2013 5:34 PM

## 2013-12-08 ENCOUNTER — Ambulatory Visit (HOSPITAL_COMMUNITY): Payer: Medicare Other | Admitting: Anesthesiology

## 2013-12-08 ENCOUNTER — Encounter (HOSPITAL_COMMUNITY)
Admission: RE | Disposition: A | Discharge status: Home / Self Care | Payer: Self-pay | Source: Ambulatory Visit | Attending: General Surgery

## 2013-12-08 ENCOUNTER — Observation Stay (HOSPITAL_COMMUNITY)
Admission: RE | Admit: 2013-12-08 | Discharge: 2013-12-11 | Disposition: A | Discharge status: Home / Self Care | Payer: Medicare Other | Source: Ambulatory Visit | Attending: General Surgery | Admitting: General Surgery

## 2013-12-08 ENCOUNTER — Encounter (HOSPITAL_COMMUNITY): Payer: Self-pay | Admitting: *Deleted

## 2013-12-08 ENCOUNTER — Encounter (HOSPITAL_COMMUNITY): Payer: Medicare Other | Admitting: Anesthesiology

## 2013-12-08 DIAGNOSIS — D689 Coagulation defect, unspecified: Secondary | ICD-10-CM | POA: Diagnosis not present

## 2013-12-08 DIAGNOSIS — H9190 Unspecified hearing loss, unspecified ear: Secondary | ICD-10-CM | POA: Insufficient documentation

## 2013-12-08 DIAGNOSIS — E785 Hyperlipidemia, unspecified: Secondary | ICD-10-CM | POA: Insufficient documentation

## 2013-12-08 DIAGNOSIS — R0789 Other chest pain: Secondary | ICD-10-CM | POA: Diagnosis not present

## 2013-12-08 DIAGNOSIS — K219 Gastro-esophageal reflux disease without esophagitis: Secondary | ICD-10-CM | POA: Diagnosis not present

## 2013-12-08 DIAGNOSIS — K319 Disease of stomach and duodenum, unspecified: Secondary | ICD-10-CM | POA: Diagnosis not present

## 2013-12-08 DIAGNOSIS — R609 Edema, unspecified: Secondary | ICD-10-CM | POA: Insufficient documentation

## 2013-12-08 DIAGNOSIS — K449 Diaphragmatic hernia without obstruction or gangrene: Secondary | ICD-10-CM | POA: Diagnosis not present

## 2013-12-08 DIAGNOSIS — H409 Unspecified glaucoma: Secondary | ICD-10-CM | POA: Insufficient documentation

## 2013-12-08 DIAGNOSIS — Z79899 Other long term (current) drug therapy: Secondary | ICD-10-CM | POA: Insufficient documentation

## 2013-12-08 DIAGNOSIS — G4730 Sleep apnea, unspecified: Secondary | ICD-10-CM | POA: Insufficient documentation

## 2013-12-08 DIAGNOSIS — M810 Age-related osteoporosis without current pathological fracture: Secondary | ICD-10-CM | POA: Diagnosis not present

## 2013-12-08 DIAGNOSIS — I10 Essential (primary) hypertension: Secondary | ICD-10-CM | POA: Insufficient documentation

## 2013-12-08 DIAGNOSIS — D649 Anemia, unspecified: Secondary | ICD-10-CM | POA: Insufficient documentation

## 2013-12-08 DIAGNOSIS — Z86718 Personal history of other venous thrombosis and embolism: Secondary | ICD-10-CM | POA: Insufficient documentation

## 2013-12-08 DIAGNOSIS — J9819 Other pulmonary collapse: Secondary | ICD-10-CM | POA: Diagnosis not present

## 2013-12-08 DIAGNOSIS — Z6833 Body mass index (BMI) 33.0-33.9, adult: Secondary | ICD-10-CM | POA: Insufficient documentation

## 2013-12-08 HISTORY — PX: HIATAL HERNIA REPAIR: SHX195

## 2013-12-08 LAB — BASIC METABOLIC PANEL
BUN: 18 mg/dL (ref 6–23)
CO2: 23 mEq/L (ref 19–32)
Calcium: 9.3 mg/dL (ref 8.4–10.5)
Chloride: 100 mEq/L (ref 96–112)
Creatinine, Ser: 0.68 mg/dL (ref 0.50–1.10)
GFR calc Af Amer: >90 mL/min (ref >90)
GFR, EST NON AFRICAN AMERICAN: 83 mL/min — AB (ref >90)
GLUCOSE: 172 mg/dL — AB (ref 70–99)
POTASSIUM: 3.5 meq/L — AB (ref 3.7–5.3)
SODIUM: 137 meq/L (ref 137–147)

## 2013-12-08 LAB — CBC
HCT: 41.5 % (ref 36.0–46.0)
Hemoglobin: 14 g/dL (ref 12.0–15.0)
MCH: 31 pg (ref 26.0–34.0)
MCHC: 33.7 g/dL (ref 30.0–36.0)
MCV: 91.8 fL (ref 78.0–100.0)
Platelets: 163 10*3/uL (ref 150–400)
RBC: 4.52 MIL/uL (ref 3.87–5.11)
RDW: 13.1 % (ref 11.5–15.5)
WBC: 12.1 10*3/uL — ABNORMAL HIGH (ref 4.0–10.5)

## 2013-12-08 SURGERY — REPAIR, HERNIA, HIATAL, LAPAROSCOPIC
Anesthesia: General | Site: Abdomen

## 2013-12-08 MED ORDER — ONDANSETRON HCL 4 MG/2ML IJ SOLN
INTRAMUSCULAR | Status: DC | PRN
  Administered 2013-12-08 (×2): 2 mg via INTRAVENOUS

## 2013-12-08 MED ORDER — PHENYLEPHRINE 40 MCG/ML (10ML) SYRINGE FOR IV PUSH (FOR BLOOD PRESSURE SUPPORT)
PREFILLED_SYRINGE | INTRAVENOUS | Status: AC
  Filled 2013-12-08: qty 10

## 2013-12-08 MED ORDER — POTASSIUM CHLORIDE IN NACL 20-0.9 MEQ/L-% IV SOLN
INTRAVENOUS | Status: DC
  Administered 2013-12-08 – 2013-12-11 (×5): via INTRAVENOUS
  Filled 2013-12-08 (×8): qty 1000

## 2013-12-08 MED ORDER — HYDROMORPHONE HCL PF 2 MG/ML IJ SOLN
INTRAMUSCULAR | Status: AC
  Filled 2013-12-08: qty 1

## 2013-12-08 MED ORDER — GLYCOPYRROLATE 0.2 MG/ML IJ SOLN
INTRAMUSCULAR | Status: DC | PRN
  Administered 2013-12-08: 0.4 mg via INTRAVENOUS

## 2013-12-08 MED ORDER — LACTATED RINGERS IV SOLN
INTRAVENOUS | Status: DC | PRN
  Administered 2013-12-08: 07:00:00 via INTRAVENOUS

## 2013-12-08 MED ORDER — PANTOPRAZOLE SODIUM 40 MG IV SOLR
40.0000 mg | Freq: Two times a day (BID) | INTRAVENOUS | Status: DC
  Administered 2013-12-08 – 2013-12-11 (×7): 40 mg via INTRAVENOUS
  Filled 2013-12-08 (×8): qty 40

## 2013-12-08 MED ORDER — HYDRALAZINE HCL 20 MG/ML IJ SOLN
2.0000 mg | Freq: Four times a day (QID) | INTRAMUSCULAR | Status: DC | PRN
  Administered 2013-12-08: 2 mg via INTRAVENOUS

## 2013-12-08 MED ORDER — ONDANSETRON HCL 4 MG PO TABS: 4.0000 mg | ORAL_TABLET | Freq: Four times a day (QID) | ORAL | Status: DC | PRN

## 2013-12-08 MED ORDER — CEFAZOLIN SODIUM-DEXTROSE 2-3 GM-% IV SOLR
2.0000 g | Freq: Once | INTRAVENOUS | Status: AC
  Administered 2013-12-08: 2 g via INTRAVENOUS

## 2013-12-08 MED ORDER — SODIUM CHLORIDE 0.9 % IJ SOLN
INTRAMUSCULAR | Status: AC
  Filled 2013-12-08: qty 10

## 2013-12-08 MED ORDER — PROMETHAZINE HCL 25 MG/ML IJ SOLN: 12.5000 mg | INTRAMUSCULAR | Status: DC | PRN

## 2013-12-08 MED ORDER — RAMIPRIL 10 MG PO CAPS
10.0000 mg | ORAL_CAPSULE | Freq: Every morning | ORAL | Status: DC
  Administered 2013-12-09 – 2013-12-11 (×3): 10 mg via ORAL
  Filled 2013-12-08 (×4): qty 1

## 2013-12-08 MED ORDER — HYDRALAZINE HCL 20 MG/ML IJ SOLN
5.0000 mg | Freq: Once | INTRAMUSCULAR | Status: AC
  Administered 2013-12-08: 5 mg via INTRAVENOUS

## 2013-12-08 MED ORDER — EPHEDRINE SULFATE 50 MG/ML IJ SOLN
INTRAMUSCULAR | Status: AC
  Filled 2013-12-08: qty 1

## 2013-12-08 MED ORDER — PROMETHAZINE HCL 25 MG/ML IJ SOLN: 6.2500 mg | INTRAMUSCULAR | Status: DC | PRN

## 2013-12-08 MED ORDER — BUPIVACAINE-EPINEPHRINE PF 0.5-1:200000 % IJ SOLN
INTRAMUSCULAR | Status: AC
  Filled 2013-12-08: qty 30

## 2013-12-08 MED ORDER — CEFAZOLIN SODIUM-DEXTROSE 2-3 GM-% IV SOLR
INTRAVENOUS | Status: AC
  Filled 2013-12-08: qty 50

## 2013-12-08 MED ORDER — EPHEDRINE SULFATE 50 MG/ML IJ SOLN
INTRAMUSCULAR | Status: DC | PRN
  Administered 2013-12-08 (×2): 7.5 mg via INTRAVENOUS

## 2013-12-08 MED ORDER — ONDANSETRON HCL 4 MG/2ML IJ SOLN
INTRAMUSCULAR | Status: AC
  Filled 2013-12-08: qty 2

## 2013-12-08 MED ORDER — DORZOLAMIDE HCL-TIMOLOL MAL 2-0.5 % OP SOLN
1.0000 [drp] | Freq: Two times a day (BID) | OPHTHALMIC | Status: DC
Start: 2013-12-08 — End: 2013-12-11
  Administered 2013-12-08 – 2013-12-11 (×6): 1 [drp] via OPHTHALMIC
  Filled 2013-12-08: qty 10

## 2013-12-08 MED ORDER — HYDROMORPHONE HCL PF 1 MG/ML IJ SOLN
0.5000 mg | INTRAMUSCULAR | Status: DC | PRN
  Administered 2013-12-09 – 2013-12-10 (×3): 1 mg via INTRAVENOUS
  Filled 2013-12-08 (×3): qty 1

## 2013-12-08 MED ORDER — PROPOFOL 10 MG/ML IV BOLUS
INTRAVENOUS | Status: DC | PRN
  Administered 2013-12-08: 25 mg via INTRAVENOUS
  Administered 2013-12-08: 175 mg via INTRAVENOUS

## 2013-12-08 MED ORDER — NEOSTIGMINE METHYLSULFATE 1 MG/ML IJ SOLN
INTRAMUSCULAR | Status: DC | PRN
  Administered 2013-12-08: 3 mg via INTRAVENOUS

## 2013-12-08 MED ORDER — DEXAMETHASONE SODIUM PHOSPHATE 10 MG/ML IJ SOLN
INTRAMUSCULAR | Status: AC
  Filled 2013-12-08: qty 1

## 2013-12-08 MED ORDER — FENTANYL CITRATE 0.05 MG/ML IJ SOLN
INTRAMUSCULAR | Status: DC | PRN
  Administered 2013-12-08 (×5): 50 ug via INTRAVENOUS

## 2013-12-08 MED ORDER — CLONIDINE HCL 0.1 MG/24HR TD PTWK
0.1000 mg | MEDICATED_PATCH | TRANSDERMAL | Status: DC
  Administered 2013-12-08: 0.1 mg via TRANSDERMAL
  Filled 2013-12-08: qty 1

## 2013-12-08 MED ORDER — METOPROLOL TARTRATE 1 MG/ML IV SOLN
2.5000 mg | Freq: Four times a day (QID) | INTRAVENOUS | Status: DC
  Administered 2013-12-08 – 2013-12-10 (×7): 2.5 mg via INTRAVENOUS
  Filled 2013-12-08 (×12): qty 5

## 2013-12-08 MED ORDER — LIDOCAINE HCL (CARDIAC) 20 MG/ML IV SOLN
INTRAVENOUS | Status: AC
  Filled 2013-12-08: qty 10

## 2013-12-08 MED ORDER — KETAMINE HCL 10 MG/ML IJ SOLN
INTRAMUSCULAR | Status: DC | PRN
  Administered 2013-12-08: 15 mg via INTRAVENOUS
  Administered 2013-12-08 (×2): 5 mg via INTRAVENOUS

## 2013-12-08 MED ORDER — HYDROMORPHONE HCL PF 1 MG/ML IJ SOLN
INTRAMUSCULAR | Status: DC | PRN
  Administered 2013-12-08 (×6): 0.5 mg via INTRAVENOUS

## 2013-12-08 MED ORDER — DEXAMETHASONE SODIUM PHOSPHATE 4 MG/ML IJ SOLN
INTRAMUSCULAR | Status: DC | PRN
  Administered 2013-12-08: 10 mg via INTRAVENOUS

## 2013-12-08 MED ORDER — ACETAMINOPHEN 10 MG/ML IV SOLN
1000.0000 mg | Freq: Once | INTRAVENOUS | Status: AC
  Administered 2013-12-08: 1000 mg via INTRAVENOUS
  Filled 2013-12-08 (×2): qty 100

## 2013-12-08 MED ORDER — HYDROMORPHONE HCL PF 1 MG/ML IJ SOLN
INTRAMUSCULAR | Status: AC
  Filled 2013-12-08: qty 1

## 2013-12-08 MED ORDER — BUPIVACAINE-EPINEPHRINE 0.5% -1:200000 IJ SOLN
INTRAMUSCULAR | Status: DC | PRN
  Administered 2013-12-08: 27 mL

## 2013-12-08 MED ORDER — GLYCOPYRROLATE 0.2 MG/ML IJ SOLN
INTRAMUSCULAR | Status: AC
  Filled 2013-12-08: qty 2

## 2013-12-08 MED ORDER — LACTATED RINGERS IR SOLN
Status: DC | PRN
  Administered 2013-12-08: 1000 mL

## 2013-12-08 MED ORDER — LIP MEDEX EX OINT
TOPICAL_OINTMENT | CUTANEOUS | Status: AC
  Administered 2013-12-08: 18:00:00
  Filled 2013-12-08: qty 7

## 2013-12-08 MED ORDER — FENTANYL CITRATE 0.05 MG/ML IJ SOLN
INTRAMUSCULAR | Status: AC
  Filled 2013-12-08: qty 5

## 2013-12-08 MED ORDER — ENOXAPARIN SODIUM 40 MG/0.4ML ~~LOC~~ SOLN
40.0000 mg | SUBCUTANEOUS | Status: DC
  Administered 2013-12-08 – 2013-12-10 (×3): 40 mg via SUBCUTANEOUS
  Filled 2013-12-08 (×4): qty 0.4

## 2013-12-08 MED ORDER — NEOSTIGMINE METHYLSULFATE 1 MG/ML IJ SOLN
INTRAMUSCULAR | Status: AC
  Filled 2013-12-08: qty 10

## 2013-12-08 MED ORDER — CISATRACURIUM BESYLATE (PF) 10 MG/5ML IV SOLN
INTRAVENOUS | Status: DC | PRN
  Administered 2013-12-08: 3 mg via INTRAVENOUS
  Administered 2013-12-08: 7 mg via INTRAVENOUS
  Administered 2013-12-08: 3 mg via INTRAVENOUS
  Administered 2013-12-08: 1 mg via INTRAVENOUS
  Administered 2013-12-08: 2 mg via INTRAVENOUS

## 2013-12-08 MED ORDER — HYDROMORPHONE HCL PF 1 MG/ML IJ SOLN
0.2500 mg | INTRAMUSCULAR | Status: DC | PRN
  Administered 2013-12-08: 0.5 mg via INTRAVENOUS

## 2013-12-08 MED ORDER — ONDANSETRON HCL 4 MG/2ML IJ SOLN
4.0000 mg | Freq: Four times a day (QID) | INTRAMUSCULAR | Status: DC | PRN
  Administered 2013-12-08 – 2013-12-10 (×3): 4 mg via INTRAVENOUS
  Filled 2013-12-08 (×3): qty 2

## 2013-12-08 MED ORDER — HYDRALAZINE HCL 20 MG/ML IJ SOLN
INTRAMUSCULAR | Status: AC
  Filled 2013-12-08: qty 1

## 2013-12-08 MED ORDER — LACTATED RINGERS IV SOLN
INTRAVENOUS | Status: DC | PRN
  Administered 2013-12-08: 08:00:00 via INTRAVENOUS

## 2013-12-08 MED ORDER — FUROSEMIDE 20 MG PO TABS
20.0000 mg | ORAL_TABLET | Freq: Every morning | ORAL | Status: DC
  Administered 2013-12-09 – 2013-12-11 (×3): 20 mg via ORAL
  Filled 2013-12-08 (×4): qty 1

## 2013-12-08 MED ORDER — PROPOFOL 10 MG/ML IV BOLUS
INTRAVENOUS | Status: AC
  Filled 2013-12-08: qty 20

## 2013-12-08 MED ORDER — KETAMINE HCL 10 MG/ML IJ SOLN
INTRAMUSCULAR | Status: AC
  Filled 2013-12-08: qty 1

## 2013-12-08 SURGICAL SUPPLY — 58 items
APL SKNCLS STERI-STRIP NONHPOA (GAUZE/BANDAGES/DRESSINGS) ×1
APPLIER CLIP ROT 10 11.4 M/L (STAPLE)
APR CLP MED LRG 11.4X10 (STAPLE)
BENZOIN TINCTURE PRP APPL 2/3 (GAUZE/BANDAGES/DRESSINGS) ×3 IMPLANT
CANISTER SUCTION 2500CC (MISCELLANEOUS) IMPLANT
CLAMP ENDO BABCK 10MM (STAPLE) ×2 IMPLANT
CLIP APPLIE ROT 10 11.4 M/L (STAPLE) IMPLANT
CLOSURE WOUND 1/2 X4 (GAUZE/BANDAGES/DRESSINGS) ×1
DECANTER SPIKE VIAL GLASS SM (MISCELLANEOUS) ×1 IMPLANT
DEVICE SUT QUICK LOAD TK 5 (STAPLE) ×10 IMPLANT
DEVICE SUT TI-KNOT TK 5X26 (MISCELLANEOUS) ×1 IMPLANT
DEVICE SUTURE ENDOST 10MM (ENDOMECHANICALS) ×3 IMPLANT
DEVICE TI KNOT TK5 (MISCELLANEOUS) ×1
DISSECTOR BLUNT TIP ENDO 5MM (MISCELLANEOUS) ×2 IMPLANT
DRAIN CHANNEL 19F RND (DRAIN) ×2 IMPLANT
DRAIN PENROSE 18X1/2 LTX STRL (DRAIN) ×3 IMPLANT
DRAPE LAPAROSCOPIC ABDOMINAL (DRAPES) ×3 IMPLANT
DRAPE POUCH INSTRU U-SHP 10X18 (DRAPES) ×3 IMPLANT
ELECT REM PT RETURN 9FT ADLT (ELECTROSURGICAL) ×3
ELECTRODE REM PT RTRN 9FT ADLT (ELECTROSURGICAL) ×1 IMPLANT
EVACUATOR 1/8 PVC DRAIN (DRAIN) ×3 IMPLANT
FELT TEFLON 4 X1 (Mesh General) ×3 IMPLANT
GLOVE BIOGEL PI IND STRL 7.0 (GLOVE) ×4 IMPLANT
GLOVE BIOGEL PI INDICATOR 7.0 (GLOVE) ×8
GLOVE EUDERMIC 7 POWDERFREE (GLOVE) ×3 IMPLANT
GOWN STRL REUS W/TWL LRG LVL3 (GOWN DISPOSABLE) ×1 IMPLANT
GOWN STRL REUS W/TWL XL LVL3 (GOWN DISPOSABLE) ×12 IMPLANT
GRASPER ENDO BABCOCK 10 (MISCELLANEOUS) IMPLANT
GRASPER ENDO BABCOCK 10MM (MISCELLANEOUS)
KIT BASIN OR (CUSTOM PROCEDURE TRAY) ×3 IMPLANT
NS IRRIG 1000ML POUR BTL (IV SOLUTION) ×3 IMPLANT
PENCIL BUTTON HOLSTER BLD 10FT (ELECTRODE) IMPLANT
POUCH ENDO CATCH II 15MM (MISCELLANEOUS) ×2 IMPLANT
QUICK LOAD TK 5 (STAPLE) ×10
RETRACTOR LAPSCP 12X46 CVD (ENDOMECHANICALS) IMPLANT
RTRCTR LAPSCP 12X46 CVD (ENDOMECHANICALS)
SCALPEL HARMONIC ACE (MISCELLANEOUS) ×3 IMPLANT
SET IRRIG TUBING LAPAROSCOPIC (IRRIGATION / IRRIGATOR) ×3 IMPLANT
SHEARS HARMONIC ACE PLUS 45CM (MISCELLANEOUS) ×2 IMPLANT
SOLUTION ANTI FOG 6CC (MISCELLANEOUS) ×3 IMPLANT
STAPLER VISISTAT 35W (STAPLE) ×3 IMPLANT
STRIP CLOSURE SKIN 1/2X4 (GAUZE/BANDAGES/DRESSINGS) ×1 IMPLANT
SUT ETHILON 2 0 PS N (SUTURE) ×3 IMPLANT
SUT MNCRL AB 4-0 PS2 18 (SUTURE) ×3 IMPLANT
SUT SURGIDAC NAB ES-9 0 48 120 (SUTURE) ×22 IMPLANT
TIP INNERVISION DETACH 40FR (MISCELLANEOUS) IMPLANT
TIP INNERVISION DETACH 50FR (MISCELLANEOUS) ×3 IMPLANT
TIP INNERVISION DETACH 56FR (MISCELLANEOUS) IMPLANT
TIPS INNERVISION DETACH 40FR (MISCELLANEOUS)
TOWEL OR 17X26 10 PK STRL BLUE (TOWEL DISPOSABLE) ×3 IMPLANT
TRAY FOLEY CATH 14FRSI W/METER (CATHETERS) ×3 IMPLANT
TRAY LAP CHOLE (CUSTOM PROCEDURE TRAY) ×3 IMPLANT
TROCAR BLADELESS OPT 5 100 (ENDOMECHANICALS) ×2 IMPLANT
TROCAR BLADELESS OPT 5 75 (ENDOMECHANICALS) ×1 IMPLANT
TROCAR XCEL BLUNT TIP 100MML (ENDOMECHANICALS) IMPLANT
TROCAR XCEL NON-BLD 11X100MML (ENDOMECHANICALS) ×3 IMPLANT
TROCAR XCEL UNIV SLVE 11M 100M (ENDOMECHANICALS) IMPLANT
TUBING INSUFFLATION 10FT LAP (TUBING) ×3 IMPLANT

## 2013-12-08 NOTE — Anesthesia Postprocedure Evaluation (Signed)
  Anesthesia Post-op Note  Patient: Melissa Murphy  Procedure(s) Performed: Procedure(s) (LRB): LAPAROSCOPIC REPAIR OF HIATAL HERNIA   (N/A)  Patient Location: PACU  Anesthesia Type: General  Level of Consciousness: awake and alert   Airway and Oxygen Therapy: Patient Spontanous Breathing  Post-op Pain: mild  Post-op Assessment: Post-op Vital signs reviewed, Patient's Cardiovascular Status Stable, Respiratory Function Stable, Patent Airway and No signs of Nausea or vomiting  Last Vitals:  Filed Vitals:   12/08/13 1258  BP: 181/79  Pulse: 78  Temp: 37.2 C  Resp: 14    Post-op Vital Signs: stable   Complications: No apparent anesthesia complications. BP at baseline after hydralazine in divided doses.

## 2013-12-08 NOTE — Interval H&P Note (Signed)
History and Physical Interval Note:  12/08/2013 7:14 AM  Melissa Murphy  has presented today for surgery, with the diagnosis of paraesophageal hernia  The goals various methods of treatment have been discussed with the patient and family on three separate occasions.. After consideration of risks, benefits and other options for treatment, the patient has consented to  Procedure(s): [place] (N/A) as a surgical intervention .  The patient's history has been reviewed, patient examined, no change in status, stable for surgery.  I have reviewed the patient's chart and labs.  Questions were answered to the patient's satisfaction.     [name]

## 2013-12-08 NOTE — Anesthesia Preprocedure Evaluation (Signed)
Anesthesia Evaluation  Patient identified by MRN, date of birth, ID band Patient awake    Reviewed: Allergy & Precautions, H&P , NPO status , Patient's Chart, lab work & pertinent test results  Airway Mallampati: II TM Distance: >3 FB Neck ROM: Full    Dental no notable dental hx.    Pulmonary sleep apnea ,  breath sounds clear to auscultation  Pulmonary exam normal       Cardiovascular Exercise Tolerance: Good hypertension, Pt. on medications and Pt. on home beta blockers Rhythm:Regular Rate:Normal     Neuro/Psych  Neuromuscular disease negative psych ROS   GI/Hepatic Neg liver ROS, hiatal hernia, GERD-  Medicated,  Endo/Other  Morbid obesity  Renal/GU negative Renal ROS  negative genitourinary   Musculoskeletal negative musculoskeletal ROS (+)   Abdominal (+) + obese,   Peds negative pediatric ROS (+)  Hematology  (+) anemia ,   Anesthesia Other Findings   Reproductive/Obstetrics negative OB ROS                           Anesthesia Physical Anesthesia Plan  ASA: III  Anesthesia Plan: General   Post-op Pain Management:    Induction: Intravenous  Airway Management Planned: Oral ETT  Additional Equipment:   Intra-op Plan:   Post-operative Plan: Extubation in OR  Informed Consent: I have reviewed the patients History and Physical, chart, labs and discussed the procedure including the risks, benefits and alternatives for the proposed anesthesia with the patient or authorized representative who has indicated his/her understanding and acceptance.   Dental advisory given  Plan Discussed with: CRNA  Anesthesia Plan Comments:         Anesthesia Quick Evaluation

## 2013-12-08 NOTE — Op Note (Addendum)
Patient Name:           Melissa Murphy   Note: This dictation was prepared with Dragon/digital dictation along with [facility] technology. Any transcriptional errors that result from this process are unintentional.   Date of Surgery:        12/08/2013  Pre op Diagnosis:      Giant hiatal hernia with organoaxial rotation  Post op Diagnosis:    Same  Procedure:                 Reduction of giant,Type III hiatal hernia, closure of diaphragm over felt pledgets, Nissen fundoplication over a 50 French dilator.  Surgeon:                     Edsel Petrin. Dalbert Batman, M.D., FACS  Assistant:                      Excell Seltzer, M.D.  Operative Indications:   I initially evaluated this woman in February 2014 upon referral by Dr. Erskine Emery. Her cardiologist is Dr. Jenkins Rouge. Dr. Tami Lin is her primary care physician.  In terms of her large hiatal hernia she has had an upper GI which shows organoaxial rotation. In March of 2014 she had ultrasound which was negative for gallstones. On 11/12/2012 she had an upper endoscopy which showed a large hiatal hernia with healthy mucosa but twisting and a volvulus was suspected. She's had a cardiac evaluation by Dr. Jenkins Rouge on 09/29/2012 and he stated that she would need to be on a clonidine patch in the perioperative period because her blood pressures are labile and because of her DVT we would be need to be very careful with Lovenox prophylaxis. On 06/12/2013 she had a CT of the abdomen for lower abdominal pain. The appendix was normal. The hiatal hernia was again noted was unchanged. I have reviewed and do not see any periumbilical hernia.This was apparently done as an outpatient. She's had a colonoscopy in January which was unremarkable.  Symptomatically she still has some early satiety but doesn't really have much in the way of reflux or water brash. She rarely vomits. There is no pain when she swallows. There is no dysphagia. Just early  satiety and she has to eat slowly.  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 Occasional bronchitis. She's never had a myocardial infarction. She is followed by Jenkins Rouge. She states that she has sleep apnea, but does not use a BiPAP device at home.   She has surgery at age 58 through a large incision at the right inframammary fold above the costal margin. 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 Umbilical herniorrhaphy with mesh..  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 She is very pleasant and independent at home.   Operative Findings:       The entire stomach was up in the chest other than the prepyloric area. There were a lot of adhesions and a large hernia sac, but once I stripped the entire hernia sac down I had intra-abdominal esophagus to work with. The diaphragm was closed with pledgeted sutures over a 50 [place] dilator. A 3 stitch 3 short very loose Nissen fundoplication was performed. A drain was left in the mediastinum anterior to the esophagus.  Procedure in Detail:          Following the induction of a general endotracheal  anesthesia the patient's abdomen was prepped and draped in a sterile fashion. Intravenous antibiotics were given. Surgical time out was performed. 0.5% Marcaine with epinephrine with epinephrine was used as local infiltration anesthetic. A 5 mm optical trocar was placed in the left subcostal region. Optical entry was uneventful. Pneumoperitoneum was created. I carefully looked around the left upper quadrant and left paracolic gutter and omentum and there was no sign of any  injury. A Nathanson retractor was placed through a 5 mm site in the subxiphoid area and the left lobe of the liver was elevated. Another 5 mm trocar was placed in the left upper quadrant. An 11 mm trocar was placed to the left of the midline above the umbilicus. An 11 mm trocar was placed to the right of the midline above the  umbilicus and a 5 mm trocar placed the right upper quadrant.      We reduced the stomach as much as possible. There were some adhesions up in the mediastinum that had to be taken down. We began the dissection at the anterior apex of the crura and incised the hernia sac and then  slowly stripped the sac out anteriorly, right lateral, left lateral as much as possible. We then took down the short gastric vessels mobilizing the entire fundus of the stomach away from the spleen. The dissection was uneventful. We then dissected the rest of the soft tissue away from the left crus. We then took the dissection over to the right side and cleaned off the right crus it and we could then create a soft tissue window behind the crura and passed a penrose  drain. Elevation of the upper stomach and esophagus was then facilitated and we stripped the rest of the sac and then continued and completed the mediastinal dissection so we had full mobilization. We felt that we identified the posterior vagus nerve and the anterior vagus nerve and we felt we preserved these. There was minimal bleeding. We then carefully stripped of the redundant hernia sac away from the stomach and removed it, being careful to avoid injury to the stomach.     We then closed the crura with 4 interrupted sutures of #1 Surgidek on felt pledgets. Before placing the last suture we passed a 32 French lighted dilator down under direct vision. That was passed uneventfully. I tied the last diaphragmatic suture and it left just a little gap. We then removed the Penrose drain and passed a suitable piece of fundus from left to right behind the esophagus. The we then identified a suitable piece of fundus on the left side to form the wrap and made sure that this was contiguous with the right side and. We then did a 3 suture, 2 cm long fundoplication with interrupted non-pledgeted sutures of #1 Surgidek. We got a  bite of fundus of the left, anterior esophagus, and fundus  on the right. The sutures were tied down with TiKnots. The dilator was removed. The repair looked good. I performed a fundopexy with 2 sutures of #1 Surgidek from the upper rim of the fundoplication to the left anterior and then right anterior aspect of the crural apex. I placed a 38 [place]  Blake drain up into the mediastinum anterior to the esophagus and brought this out through the right upper quadrant trocar site and sutured it  to the skin with nylon suture. The operative field was inspected. Everything looked good. There was no sign of any injury to the esophagus or stomach. At  this point we released the pneumoperitoneum and removed the trocars. Skin incisions were closed with staples. The patient tolerated the procedure well taken to PACU in stable condition. EBL 20 cc. Counts correct. Complications none.           Edsel Petrin. Dalbert Batman, M.D., FACS General and Minimally Invasive Surgery Breast and Colorectal Surgery  12/08/2013 10:32 AM

## 2013-12-08 NOTE — Transfer of Care (Signed)
Immediate Anesthesia Transfer of Care Note  Patient: Melissa Murphy  Procedure(s) Performed: Procedure(s): LAPAROSCOPIC REPAIR OF HIATAL HERNIA   (N/A)  Patient Location: PACU  Anesthesia Type:General  Level of Consciousness: awake and sedated  Airway & Oxygen Therapy: Patient Spontanous Breathing and Patient connected to nasal cannula oxygen  Post-op Assessment: Report given to PACU RN and Post -op Vital signs reviewed and stable  Post vital signs: stable  Complications: No apparent anesthesia complications

## 2013-12-09 ENCOUNTER — Encounter (HOSPITAL_COMMUNITY): Payer: Self-pay | Admitting: General Surgery

## 2013-12-09 ENCOUNTER — Observation Stay (HOSPITAL_COMMUNITY): Payer: Medicare Other

## 2013-12-09 DIAGNOSIS — K461 Unspecified abdominal hernia with gangrene: Secondary | ICD-10-CM | POA: Diagnosis not present

## 2013-12-09 LAB — CBC
HCT: 39.9 % (ref 36.0–46.0)
HEMOGLOBIN: 12.9 g/dL (ref 12.0–15.0)
MCH: 30 pg (ref 26.0–34.0)
MCHC: 32.3 g/dL (ref 30.0–36.0)
MCV: 92.8 fL (ref 78.0–100.0)
Platelets: 149 10*3/uL — ABNORMAL LOW (ref 150–400)
RBC: 4.3 MIL/uL (ref 3.87–5.11)
RDW: 13.4 % (ref 11.5–15.5)
WBC: 8.8 10*3/uL (ref 4.0–10.5)

## 2013-12-09 LAB — BASIC METABOLIC PANEL
BUN: 14 mg/dL (ref 6–23)
CO2: 27 mEq/L (ref 19–32)
CREATININE: 0.72 mg/dL (ref 0.50–1.10)
Calcium: 9.2 mg/dL (ref 8.4–10.5)
Chloride: 103 mEq/L (ref 96–112)
GFR, EST NON AFRICAN AMERICAN: 82 mL/min — AB (ref >90)
Glucose, Bld: 137 mg/dL — ABNORMAL HIGH (ref 70–99)
POTASSIUM: 4.6 meq/L (ref 3.7–5.3)
Sodium: 141 mEq/L (ref 137–147)

## 2013-12-09 NOTE — Progress Notes (Addendum)
1 Day Post-Op  Subjective: Patient's smiling, friendly, and does not appear to be any distress. Some language barrier since signing interpreter is not present. Husband in the room.  According to nursing, she has had no problems during the might and no major complaints.  BP has been a little bit elevated but is now normal. Urine output high. Afebrile.  WBC 8800. Hemoglobin 12.9. BUN 14. Creatinine 0.72. Glucose 137. Potassium 4.6.  Objective: Vital signs in last 24 hours: Temp:  [96.1 F (35.6 C)-98.9 F (37.2 C)] 98.4 F (36.9 C) (04/28 0200) Pulse Rate:  [62-83] 81 (04/28 0204) Resp:  [10-18] 14 (04/28 0204) BP: (139-228)/(79-132) 142/82 mmHg (04/28 0204) SpO2:  [94 %-100 %] 99 % (04/28 0204) Weight:  [216 lb 13.2 oz (98.35 kg)] 216 lb 13.2 oz (98.35 kg) (04/27 1303)    Intake/Output from previous day: 04/27 0701 - 04/28 0700 In: 1950 [I.V.:1600] Out: 1265 [Urine:1125; Drains:90; Blood:50] Intake/Output this shift: Total I/O In: 0  Out: 810 [Urine:750; Drains:60]     EXAM: General: Alert and stable. The friendly and smiling. No apparent distress Lungs: Clear to auscultation bilaterally Abdomen: Generally soft and benign. Protuberant. Wounds looked fine. The JP drainage serosanguinous , low volume.  Lab Results:  Results for orders placed during the hospital encounter of 12/08/13 (from the past 24 hour(s))  CBC     Status: Abnormal   Collection Time    12/08/13  1:26 PM      Result Value Ref Range   WBC 12.1 (*) 4.0 - 10.5 K/uL   RBC 4.52  3.87 - 5.11 MIL/uL   Hemoglobin 14.0  12.0 - 15.0 g/dL   HCT 41.5  36.0 - 46.0 %   MCV 91.8  78.0 - 100.0 fL   MCH 31.0  26.0 - 34.0 pg   MCHC 33.7  30.0 - 36.0 g/dL   RDW 13.1  11.5 - 15.5 %   Platelets 163  150 - 400 K/uL  BASIC METABOLIC PANEL     Status: Abnormal   Collection Time    12/08/13  1:26 PM      Result Value Ref Range   Sodium 137  137 - 147 mEq/L   Potassium 3.5 (*) 3.7 - 5.3 mEq/L   Chloride 100  96 -  112 mEq/L   CO2 23  19 - 32 mEq/L   Glucose, Bld 172 (*) 70 - 99 mg/dL   BUN 18  6 - 23 mg/dL   Creatinine, Ser 0.68  0.50 - 1.10 mg/dL   Calcium 9.3  8.4 - 10.5 mg/dL   GFR calc non Af Amer 83 (*) >90 mL/min   GFR calc Af Amer >90  >90 mL/min  BASIC METABOLIC PANEL     Status: Abnormal   Collection Time    12/09/13  3:50 AM      Result Value Ref Range   Sodium 141  137 - 147 mEq/L   Potassium 4.6  3.7 - 5.3 mEq/L   Chloride 103  96 - 112 mEq/L   CO2 27  19 - 32 mEq/L   Glucose, Bld 137 (*) 70 - 99 mg/dL   BUN 14  6 - 23 mg/dL   Creatinine, Ser 0.72  0.50 - 1.10 mg/dL   Calcium 9.2  8.4 - 10.5 mg/dL   GFR calc non Af Amer 82 (*) >90 mL/min   GFR calc Af Amer >90  >90 mL/min  CBC     Status: Abnormal   Collection  Time    12/09/13  3:50 AM      Result Value Ref Range   WBC 8.8  4.0 - 10.5 K/uL   RBC 4.30  3.87 - 5.11 MIL/uL   Hemoglobin 12.9  12.0 - 15.0 g/dL   HCT 39.9  36.0 - 46.0 %   MCV 92.8  78.0 - 100.0 fL   MCH 30.0  26.0 - 34.0 pg   MCHC 32.3  30.0 - 36.0 g/dL   RDW 13.4  11.5 - 15.5 %   Platelets 149 (*) 150 - 400 K/uL     Studies/Results: No results found.  . cloNIDine  0.1 mg Transdermal Weekly  . dorzolamide-timolol  1 drop Right Eye BID  . enoxaparin (LOVENOX) injection  40 mg Subcutaneous Q24H  . furosemide  20 mg Oral q morning - 10a  . metoprolol  2.5 mg Intravenous 4 times per day  . pantoprazole (PROTONIX) IV  40 mg Intravenous Q12H  . ramipril  10 mg Oral q morning - 10a     Assessment/Plan: s/p Procedure(s): LAPAROSCOPIC REPAIR OF HIATAL HERNIA    POD #1. Laparoscopic reduction and repair of giant, type III hiatal hernia and Nissen fundoplication Stable without apparent postop complications Water-soluble upper GI this morning. If this looks good begin clear liquids  Hypertension.On Catapres patch. On Lopressor IV.  On altace tab. Once back on diet will resume Catapres tablet, Lasix tablet, Lopressor tablet, ramipril tablet. Dr. Johnsie Cancel is  her cardiologist  History DVT left leg, 2008. On Lovenox now.  Patient is deaf.  I will be out of town for 5 days.  Dr. Zella Richer  will assume her care in my absence.  @PROBHOSP @  LOS: 1 day    Melissa Murphy 12/09/2013  . .prob

## 2013-12-09 NOTE — Discharge Instructions (Addendum)
[facility]  Instructions  No lifting over 10 pounds.  No driving until released to do so by Dr. Dalbert Batman Follow a level I diet as we discussed. Walk frequently. Drink plenty of fluid. Take Gas-X if you have gas bloating. Call for high fever (>101.5), vomiting, wound problems. Shower daily. Change bandage daily until wound has closed. Appointment with Dr. Dalbert Batman in 2 weeks.  Please call and make the appointment [phone]).   EATING AFTER YOUR ESOPHAGEAL SURGERY  After your esophageal surgery, you can expect some difficulty swallowing.  If food sticks when you eat, it is called "dysphagia".  This is due to swelling around your surgery site and will most likely resolve within a few weeks.  To help you through this temporary phase, we start you out on a pureed diet.  Your first meal in the hospital was clear liquids.  You should have been given a pureed diet by the time you left the hospital.  We ask patients to stay on a pureed diet for the first two weeks to avoid anything getting "stuck" near your recent surgery.  Don't be alarmed if your ability to swallow doesn't progress according to this plan.  Everyone is different and some take longer or shorter.  Use common sense.  If you are having trouble swallowing a particular food, then avoid it.  If food is sticking when you advance your diet, go back to the previous day or two.  In general some simple rules to follow are:  Maintain an upright position (as near 90 degrees as possible) whenever eating or drinking.  Take small bites - only 1/2 to 1 teaspoon at a time.  Eat slowly.  It may also help to eat only one food at a time.  Avoid talking while eating.  Do not mix solid foods and liquids in the same mouthful and do not "wash foods down" with liquids, unless you have been instructed to do so by your surgeon.  Eat in a relaxed atmosphere, with no distractions.  Following each meal, sit in an upright position (90  degree angle) for 60 minutes.  Avoid a lot of carbonated (bubbly) drinks.  Do not use straws.  If food does stick, don't panic.  Try to relax and let the food pass on its own.  Sipping strong hot black tea can also help.  If you have any questions please call our office at [phone].   LEVEL 1 PUREED FOODS:  1ST 2 WEEKS AFTER SURGERY Foods in this group are pureed or blenderized to a smooth, mashed potato-like consistency.  If necessary, the pureed foods can keep their shape with the addition of a thickening agent.  Meat should be pureed to a smooth pasty consistency.  Hot broth or gravy may be added to the pureed meat, approximately 1 oz. of liquid per 3 oz. serving of meat. CAUTION:  If any foods do not puree into a smooth consistency, it may make eating for swallowing more difficult.  For example, zucchini seeds sometimes do not blend well. Hot Foods Cold Foods  Pureed scrambled eggs and cheese Pureed cottage cheese  Baby cereals Thickened juices and nectars  Thinned cooked cereals (no lumps) Thickened milk or eggnog  Pureed [place] toast or pancakes Ensure  Mashed potatoes Ice cream  Pureed parsley, au gratin, scalloped potatoes, candied sweet potatoes Fruit or [place] ice, sherbet  Pureed buttered or alfredo noodles Plain yogurt  Pureed vegetables (no corn or peas) Instant breakfast  Pureed soups  and creamed soups Smooth pudding, mousse, custard  Pureed scalloped apples Whipped gelatin  Gravies Sugar, syrup, honey, jelly  Sauces, cheese, tomato, barbecue, white, creamed Cream  Any baby food Creamer  Alcohol in moderation (not beer or champagne) Margarine  Coffee or tea Mayonnaise   Ketchup, mustard   Apple sauce   SAMPLE MENU:  PUREED DIET Breakfast Lunch Dinner   Orange juice, 1/2 cup  Cream of wheat, 1/2 cup  Pineapple juice, 1/2 cup  Pureed [place], barley soup, 3/4 cup  Pureed Hawaiian chicken, 3 oz   Scrambled eggs, mashed or blended with cheese, 1/2 cup  Tea  or coffee, 1 cup   Whole milk, 1 cup   Non-dairy creamer, 2 Tbsp.  Mashed potatoes, 1/2 cup  Pureed cooled broccoli, 1/2 cup  Apple sauce, 1/2 cup  Coffee or tea  Mashed potatoes, 1/2 cup  Pureed spinach, 1/2 cup  Frozen yogurt, 1/2 cup  Tea or coffee    LEVEL 2 After your first 2 weeks, you can advance to a soft diet.  Keep on this diet until everything goes down easily. Hot Foods Cold Foods  White fish Cottage cheese  Stuffed fish Junior baby fruit  Baby food meals Semi thickened juices  Minced soft cooked, scrambled, poached eggs nectars  Souffle & omelets Ripe mashed bananas  Cooked cereals Canned fruit, pineapple sauce, milk  potatoes Milkshake  Buttered or Alfredo noodles Custard  Cooked cooled vegetable Puddings, including tapioca  Sherbet Yogurt  Vegetable soup or alphabet soup Fruit ice, [place] ice  Gravies Whipped gelatin  Sugar, syrup, honey, jelly Junior baby desserts  Sauces:  Cheese, creamed, barbecue, tomato, white Cream  Coffee or tea Margarine   SAMPLE MENU:  LEVEL 2 Breakfast Lunch Dinner   Orange juice, 1/2 cup  Oatmeal, 1/2 cup  Scrambled eggs with cheese, 1/2 cup  Decaffeinated tea, 1 cup  Whole milk, 1 cup  Non-dairy creamer, 2 Tbsp  Pineapple juice, 1/2 cup  Minced beef, 3 oz  Gravy, 2 Tbsp  Mashed potatoes, 1/2 cup  Minced fresh broccoli, 1/2 cup  Applesauce, 1/2 cup  Coffee, 1 cup  [place], barley soup, 3/4 cup  Minced Hawaiian chicken, 3 oz  Mashed potatoes, 1/2 cup  Cooked spinach, 1/2 cup  Frozen yogurt, 1/2 cup  Non-dairy creamer, 2 Tbsp    LEVEL 3 After all the foods in level 2 (soft diet) are passing through well you should advance up to the next level.  It is still important to cut these foods into small pieces and eat slowly. Hot Foods Cold Foods  Poultry Cottage cheese  Chopped Swedish meatballs Yogurt  Meat salads (ground or flaked meat) Milk  Flaked fish (tuna) Milkshakes  Poached or scrambled  eggs Soft, cold, dry cereal  Souffles and omelets Fruit juices or nectars  Cooked cereals Chopped canned fruit  Chopped [place] toast or pancakes Canned fruit cocktail  Noodles or pasta (no rice) Pudding, mousse, custard  Cooked vegetables (no frozen peas, corn, or mixed vegetables) Green salad  Canned small sweet peas Ice cream  Creamed soup or vegetable soup Fruit ice, [place] ice  Pureed vegetable soup or alphabet soup Non-dairy creamer  Ground scalloped apples Margarine  Gravies Mayonnaise  Sauces:  Cheese, creamed, barbecue, tomato, white Ketchup  Coffee or tea Mustard   SAMPLE MENU:  LEVEL 3 Breakfast Lunch Dinner   Orange juice, 1/2 cup  Oatmeal, 1/2 cup  Scrambled eggs with cheese, 1/2 cup  Decaffeinated tea, 1 cup  Whole  milk, 1 cup  Non-dairy creamer, 2 Tbsp  Ketchup, 1 Tbsp  Margarine, 1 tsp  Salt, 1/4 tsp  Sugar, 2 tsp  Pineapple juice, 1/2 cup  Ground beef, 3 oz  Gravy, 2 Tbsp  Mashed potatoes, 1/2 cup  Cooked spinach, 1/2 cup  Applesauce, 1/2 cup  Decaffeinated coffee  Whole milk  Non-dairy creamer, 2 Tbsp  Margarine, 1 tsp  Salt, 1/4 tsp  Pureed [place], barley soup, 3/4 cup  Barbecue chicken, 3 oz  Mashed potatoes, 1/2 cup  Ground fresh broccoli, 1/2 cup  Frozen yogurt, 1/2 cup  Decaffeinated tea, 1 cup  Non-dairy creamer, 2 Tbsp  Margarine, 1 tsp  Salt, 1/4 tsp  Sugar, 1 tsp    LEVEL 4:  REGULAR FOODS Foods in this group are soft, moist, regularly textured foods.  This level includes red meat and breads, which tend to be the hardest things to swallow.  Eat very slow, chew well and continue to avoid carbonated drinks. Hot Foods Cold Foods  Baked fish or skinned Soft cheeses - cottage cheese  Souffles and omelets Cream cheese  Eggs Yogurt  Stuffed shells Milk  Spaghetti with meat sauce Milkshakes  Cooked cereal Cold dry cereals (no nuts, dried fruit, coconut)  [place] toast or pancakes Crackers  Buttered toast Fruit  juices or nectars  Noodles or pasta (no rice) Canned fruit  Potatoes (all types) Ripe bananas  Soft, cooked vegetables (no corn, lima, or baked beans) Peeled, ripe, fresh fruit  Creamed soups or vegetable soup Cakes (no nuts, dried fruit, coconut)  Canned chicken noodle soup Plain doughnuts  Gravies Ice cream  Bacon dressing Pudding, mousse, custard  Sauces:  Cheese, creamed, barbecue, tomato, white Fruit ice, [place] ice, sherbet  Decaffeinated tea or coffee Whipped gelatin  Pork chops Regular gelatin   Canned fruited gelatin molds   Sugar, syrup, honey, jam, jelly   Cream   Non-dairy   Margarine   Oil   Mayonnaise   Ketchup   Mustard

## 2013-12-09 NOTE — Progress Notes (Signed)
UR Completed.  Melissa Murphy [name] [phone] 12/09/2013  

## 2013-12-10 ENCOUNTER — Observation Stay (HOSPITAL_COMMUNITY): Payer: Medicare Other

## 2013-12-10 ENCOUNTER — Encounter (HOSPITAL_COMMUNITY): Payer: Self-pay | Admitting: Radiology

## 2013-12-10 DIAGNOSIS — I10 Essential (primary) hypertension: Secondary | ICD-10-CM | POA: Diagnosis not present

## 2013-12-10 DIAGNOSIS — J9819 Other pulmonary collapse: Secondary | ICD-10-CM | POA: Diagnosis not present

## 2013-12-10 DIAGNOSIS — R141 Gas pain: Secondary | ICD-10-CM | POA: Diagnosis not present

## 2013-12-10 DIAGNOSIS — J90 Pleural effusion, not elsewhere classified: Secondary | ICD-10-CM | POA: Diagnosis not present

## 2013-12-10 DIAGNOSIS — R143 Flatulence: Secondary | ICD-10-CM | POA: Diagnosis not present

## 2013-12-10 LAB — CBC
HEMATOCRIT: 36.4 % (ref 36.0–46.0)
HEMOGLOBIN: 11.8 g/dL — AB (ref 12.0–15.0)
MCH: 30.3 pg (ref 26.0–34.0)
MCHC: 32.4 g/dL (ref 30.0–36.0)
MCV: 93.3 fL (ref 78.0–100.0)
Platelets: 138 10*3/uL — ABNORMAL LOW (ref 150–400)
RBC: 3.9 MIL/uL (ref 3.87–5.11)
RDW: 13.7 % (ref 11.5–15.5)
WBC: 9.7 10*3/uL (ref 4.0–10.5)

## 2013-12-10 MED ORDER — METOPROLOL TARTRATE 1 MG/ML IV SOLN
5.0000 mg | Freq: Four times a day (QID) | INTRAVENOUS | Status: DC
  Administered 2013-12-10 – 2013-12-11 (×5): 5 mg via INTRAVENOUS
  Filled 2013-12-10 (×9): qty 5

## 2013-12-10 MED ORDER — ACETAMINOPHEN 160 MG/5ML PO SOLN
650.0000 mg | Freq: Four times a day (QID) | ORAL | Status: DC | PRN
  Administered 2013-12-10: 650 mg via ORAL
  Filled 2013-12-10: qty 20.3

## 2013-12-10 MED ORDER — HYDRALAZINE HCL 20 MG/ML IJ SOLN
20.0000 mg | INTRAMUSCULAR | Status: DC | PRN
  Administered 2013-12-10 – 2013-12-11 (×3): 20 mg via INTRAVENOUS
  Filled 2013-12-10 (×3): qty 1

## 2013-12-10 MED ORDER — IOHEXOL 300 MG/ML  SOLN
100.0000 mL | Freq: Once | INTRAMUSCULAR | Status: AC | PRN
  Administered 2013-12-10: 100 mL via INTRAVENOUS

## 2013-12-10 NOTE — Progress Notes (Signed)
2 Days Post-Op  Subjective: She was feeling well until 1:00 this morning when she began having some problem with chest discomfort when she was breathing. This seemed to start after she was walking some. She is feeling better now. She has minimal abdominal pain. No nausea. She feels cold.  Through her interpreter, she threw up 3 times after the upper GI study. Twice she threw up some blood. The other time it was clear. She currently is feeling much better overall. She denies any chest pain or shortness of breath. She denies any dysphagia. She is able to belch.  Objective: Vital signs in last 24 hours: Temp:  [98.4 F (36.9 C)-102.2 F (39 C)] 102.2 F (39 C) (04/29 0808) Pulse Rate:  [69-107] 107 (04/29 0539) Resp:  [14-15] 15 (04/29 0539) BP: (123-198)/(63-86) 160/63 mmHg (04/29 0539) SpO2:  [93 %-99 %] 93 % (04/29 0539)    Intake/Output from previous day: 04/28 0701 - 04/29 0700 In: 2551.3 [P.O.:720; I.V.:1831.3] Out: 3474 [Urine:1350; Drains:47] Intake/Output this shift:    PE: General- In NAD CV-Increased rate Lungs-Decreased breath sound Abdomen-soft, no significant tenderness, dressings dry, thin serosanguinous drain output.  Lab Results:   Recent Labs  12/08/13 1326 12/09/13 0350  WBC 12.1* 8.8  HGB 14.0 12.9  HCT 41.5 39.9  PLT 163 149*   BMET  Recent Labs  12/08/13 1326 12/09/13 0350  NA 137 141  K 3.5* 4.6  CL 100 103  CO2 23 27  GLUCOSE 172* 137*  BUN 18 14  CREATININE 0.68 0.72  CALCIUM 9.3 9.2   PT/INR No results found for this basename: LABPROT, INR,  in the last 72 hours Comprehensive Metabolic Panel:    Component Value Date/Time   NA 141 12/09/2013 0350   NA 137 12/08/2013 1326   K 4.6 12/09/2013 0350   K 3.5* 12/08/2013 1326   CL 103 12/09/2013 0350   CL 100 12/08/2013 1326   CO2 27 12/09/2013 0350   CO2 23 12/08/2013 1326   BUN 14 12/09/2013 0350   BUN 18 12/08/2013 1326   CREATININE 0.72 12/09/2013 0350   CREATININE 0.68 12/08/2013 1326    CREATININE 0.72 05/22/2013 1724   CREATININE 0.79 03/11/2012 1806   GLUCOSE 137* 12/09/2013 0350   GLUCOSE 172* 12/08/2013 1326   CALCIUM 9.2 12/09/2013 0350   CALCIUM 9.3 12/08/2013 1326   AST 22 12/03/2013 1535   AST 24 08/25/2013 0431   ALT 15 12/03/2013 1535   ALT 15 08/25/2013 0431   ALKPHOS 77 12/03/2013 1535   ALKPHOS 87 08/25/2013 0431   BILITOT 0.3 12/03/2013 1535   BILITOT 0.3 08/25/2013 0431   PROT 7.5 12/03/2013 1535   PROT 8.0 08/25/2013 0431   ALBUMIN 3.7 12/03/2013 1535   ALBUMIN 4.0 08/25/2013 0431     Studies/Results: Dg Ugi W/water Sol Cm  12/09/2013   CLINICAL DATA:  Status post hiatal hernia repair  EXAM: WATER SOLUBLE UPPER GI SERIES  TECHNIQUE: Single-column upper GI series was performed using water soluble contrast.  CONTRAST:  50 cc Omni 300  COMPARISON:  None.  FLUOROSCOPY TIME:  56 seconds  FINDINGS: The patient ingested water-soluble contrast material without difficulty. Contrast passed through the distal esophagus and through the fundoplication wrap without evidence of contrast extravasation. There is adequate opacification of the mid and distal stomach with passage of contrast material into the proximal small bowel loops.  IMPRESSION: 1. Intact fundoplication wrap without evidence for contrast extravasation. These results will be called to the ordering clinician  or representative by the Radiologist Assistant, and communication documented in the PACS Dashboard.   Electronically Signed   By: Kerby Moors M.D.   On: 12/09/2013 12:27    Anti-infectives: Anti-infectives   Start     Dose/Rate Route Frequency Ordered Stop   12/08/13 0730  ceFAZolin (ANCEF) IVPB 2 g/50 mL premix     2 g 100 mL/hr over 30 Minutes Intravenous  Once 12/08/13 0716 12/08/13 0715      Assessment Active Problems:   Hiatal hernia s/p lap repair and Nissen fundoplication-UGI looked good yesterday with no leak; has fever and mild tachycardia this AM which may be due to atelectasis.  Not having  chest pain this AM.  However, she has had 3 episodes of nausea and vomiting since the upper GI. Need to rule out leak again today.  HTN-BP elevated this AM.    LOS: 2 days   Plan: Check CBC.  OOB and mobilize.  Incentive spirometer.   Will check a chest CT and limited upper abdominal CT to rule out a delayed leak. I feel this may be more sensitive. Rhunette Croft Artur Winningham 12/10/2013

## 2013-12-10 NOTE — Progress Notes (Signed)
CT of chest and upper abdomen demonstrates atelectasis and a mediastinal seroma but no evidence of leak or infection.  Will advance to full liquids.

## 2013-12-10 NOTE — Evaluation (Signed)
Physical Therapy Evaluation Patient Details Name: Melissa Murphy MRN: [id number] DOB: [birth year] Today's Date: 12/10/2013   History of Present Illness  Giant hernia reduction. 12/08/13  Clinical Impression  Pt communicates well with writing , interpreter available as needed. Pt will bnenefit fromPT to address problems. Plans Dc home.    Follow Up Recommendations No PT follow up    Equipment Recommendations  None recommended by PT    Recommendations for Other Services       Precautions / Restrictions Precautions Precaution Comments: abd drains,  Restrictions Weight Bearing Restrictions: No      Mobility  Bed Mobility Overal bed mobility: Needs Assistance Bed Mobility: Supine to Sit     Supine to sit: Min guard     General bed mobility comments: extra time for abd pain  Transfers Overall transfer level: Needs assistance Equipment used: Rolling walker (2 wheeled) Transfers: Sit to/from Stand Sit to Stand: Min assist            Ambulation/Gait Ambulation/Gait assistance: Min assist Ambulation Distance (Feet): 125 Feet Assistive device: Rolling walker (2 wheeled)       General Gait Details: slow, steady gait  Stairs            Wheelchair Mobility    Modified Rankin (Stroke Patients Only)       Balance                                             Pertinent Vitals/Pain Indicates "gas" pain.    Home Living Family/patient expects to be discharged to:: Private residence Living Arrangements: Spouse/significant other Available Help at Discharge: Family Type of Home: House Home Access: Stairs to enter Entrance Stairs-Rails: None Technical brewer of Steps: 2 Home Layout: One level Home Equipment:  (3 wheeled RW)      Prior Function Level of Independence: Independent with assistive device(s)               Hand Dominance        Extremity/Trunk Assessment   Upper Extremity Assessment: Generalized  weakness           Lower Extremity Assessment: Generalized weakness         Communication   Communication:  (interpreter present, can comm. with writing.)  Cognition Arousal/Alertness: Awake/alert Behavior During Therapy: WFL for tasks assessed/performed Overall Cognitive Status: Within Functional Limits for tasks assessed                      General Comments      Exercises        Assessment/Plan    PT Assessment Patient needs continued PT services  PT Diagnosis Difficulty walking;Generalized weakness;Acute pain   PT Problem List Decreased strength;Decreased activity tolerance;Decreased knowledge of use of DME;Decreased safety awareness;Decreased knowledge of precautions;Pain  PT Treatment Interventions DME instruction;Gait training;Functional mobility training;Therapeutic activities;Therapeutic exercise;Patient/family education   PT Goals (Current goals can be found in the Care Plan section) Acute Rehab PT Goals Patient Stated Goal: agreed to walk PT Goal Formulation: With patient Time For Goal Achievement: 12/24/13 Potential to Achieve Goals: Good    Frequency Min 3X/week   Barriers to discharge        Co-evaluation               End of Session   Activity Tolerance: Patient tolerated treatment well Patient left: in  chair;with call bell/phone within reach;with family/visitor present Nurse Communication: Mobility status    Functional Assessment Tool Used: clinical judgement Functional Limitation: Mobility: Walking and moving around Mobility: Walking and Moving Around Current Status ([id number]): At least 20 percent but less than 40 percent impaired, limited or restricted Mobility: Walking and Moving Around Goal Status [phone]): At least 1 percent but less than 20 percent impaired, limited or restricted    Time: 0817-0856 PT Time Calculation (min): 39 min   Charges:   PT Evaluation $Initial PT Evaluation Tier I: 1 Procedure PT  Treatments $Gait Training: 38-52 mins   PT G Codes:   Functional Assessment Tool Used: clinical judgement Functional Limitation: Mobility: Walking and moving around    [employer] 12/10/2013, 10:56 AM Tresa Endo PT [phone]

## 2013-12-11 MED ORDER — OXYCODONE HCL 5 MG PO TABS: 5.0000 mg | ORAL_TABLET | Freq: Four times a day (QID) | ORAL | Status: DC | PRN

## 2013-12-11 NOTE — Progress Notes (Signed)
3 Days Post-Op  Subjective: Feels better overall today.  Tolerating full liquids.  Getting OOB independently.  Passing gas.  No dysphagia.  Objective: Vital signs in last 24 hours: Temp:  [98.8 F (37.1 C)-101.6 F (38.7 C)] 99.2 F (37.3 C) (04/30 0541) Pulse Rate:  [71-94] 94 (04/30 0541) Resp:  [14-16] 15 (04/30 0541) BP: (144-184)/(67-98) 144/68 mmHg (04/30 0541) SpO2:  [97 %-98 %] 98 % (04/30 0541)    Intake/Output from previous day: 04/29 0701 - 04/30 0700 In: 2642.5 [P.O.:840; I.V.:1802.5] Out: 2805 [Urine:2700; Drains:105] Intake/Output this shift:    PE: General- In NAD Abdomen-soft, incisions are clean and intact, serous drain output; staples removed and Benzoin and steri strips applied close together; drain removed.  Lab Results:   Recent Labs  12/09/13 0350 12/10/13 0910  WBC 8.8 9.7  HGB 12.9 11.8*  HCT 39.9 36.4  PLT 149* 138*   BMET  Recent Labs  12/08/13 1326 12/09/13 0350  NA 137 141  K 3.5* 4.6  CL 100 103  CO2 23 27  GLUCOSE 172* 137*  BUN 18 14  CREATININE 0.68 0.72  CALCIUM 9.3 9.2   PT/INR No results found for this basename: LABPROT, INR,  in the last 72 hours Comprehensive Metabolic Panel:    Component Value Date/Time   NA 141 12/09/2013 0350   NA 137 12/08/2013 1326   K 4.6 12/09/2013 0350   K 3.5* 12/08/2013 1326   CL 103 12/09/2013 0350   CL 100 12/08/2013 1326   CO2 27 12/09/2013 0350   CO2 23 12/08/2013 1326   BUN 14 12/09/2013 0350   BUN 18 12/08/2013 1326   CREATININE 0.72 12/09/2013 0350   CREATININE 0.68 12/08/2013 1326   CREATININE 0.72 05/22/2013 1724   CREATININE 0.79 03/11/2012 1806   GLUCOSE 137* 12/09/2013 0350   GLUCOSE 172* 12/08/2013 1326   CALCIUM 9.2 12/09/2013 0350   CALCIUM 9.3 12/08/2013 1326   AST 22 12/03/2013 1535   AST 24 08/25/2013 0431   ALT 15 12/03/2013 1535   ALT 15 08/25/2013 0431   ALKPHOS 77 12/03/2013 1535   ALKPHOS 87 08/25/2013 0431   BILITOT 0.3 12/03/2013 1535   BILITOT 0.3 08/25/2013 0431   PROT  7.5 12/03/2013 1535   PROT 8.0 08/25/2013 0431   ALBUMIN 3.7 12/03/2013 1535   ALBUMIN 4.0 08/25/2013 0431     Studies/Results: Ct Chest W Contrast  12/10/2013   CLINICAL DATA:  Postop fundoplication repair. Fever, tachycardia. Evaluate for delayed leak.  EXAM: CT CHEST AND ABDOMEN WITH CONTRAST  TECHNIQUE: Multidetector CT imaging of the chest and abdomen was performed following the standard protocol during bolus administration of intravenous contrast.  CONTRAST:  132mL OMNIPAQUE IOHEXOL 300 MG/ML  SOLN  COMPARISON:  06/06/2013  FINDINGS: CT CHEST FINDINGS  There are small bilateral pleural effusions. Compressive atelectasis in the lower lobes bilaterally, left slightly greater than right.  Small amount of pneumomediastinum noted, presumably postoperative. Heart is normal size. Aorta is normal caliber. No mediastinal, hilar, or axillary adenopathy. Chest wall soft tissues are unremarkable.  CT ABDOMEN  FINDINGS  Postsurgical changes from fundoplication at the proximal stomach. There is a surgical drain in place entering in the lower abdomen, coursing superiorly near the postoperative site at the proximal stomach. This in appears to pass through the hiatal hernia defect in into the posterior mediastinum of the lower chest. There appears to be a focal fluid collection in the posterior mediastinum of the lower chest adjacent to this drainage catheter.  This may represent postoperative fluid. This is difficult to separate from the adjacent pleural effusions but measures approximately 8.0 x 3.7 cm. No gas or contrast material noted within this fluid collection.  Liver, gallbladder, spleen, pancreas, adrenals and kidneys are unremarkable. Subcutaneous gas noted throughout the anterior and lateral abdominal wall compatible with recent surgery. Aorta is normal caliber. No acute bony abnormality. Degenerative changes in the visualized lumbar spine.  IMPRESSION: Small bilateral pleural effusions with compressive  atelectasis in the lower lobes.  Posterior mediastinal fluid collection adjacent to the diaphragmatic hiatus. An abdominal drain passes through the hiatus and into this fluid collection. This may represent postoperative fluid collection but cannot completely exclude leak. No gas or contrast material within this fluid collection.  Small amount of pneumomediastinum. Subcutaneous gas. Findings compatible with recent surgery.   Electronically Signed   By: Rolm Baptise M.D.   On: 12/10/2013 11:48   Ct Abdomen W Contrast  12/10/2013   CLINICAL DATA:  Postop fundoplication repair. Fever, tachycardia. Evaluate for delayed leak.  EXAM: CT CHEST AND ABDOMEN WITH CONTRAST  TECHNIQUE: Multidetector CT imaging of the chest and abdomen was performed following the standard protocol during bolus administration of intravenous contrast.  CONTRAST:  144mL OMNIPAQUE IOHEXOL 300 MG/ML  SOLN  COMPARISON:  06/06/2013  FINDINGS: CT CHEST FINDINGS  There are small bilateral pleural effusions. Compressive atelectasis in the lower lobes bilaterally, left slightly greater than right.  Small amount of pneumomediastinum noted, presumably postoperative. Heart is normal size. Aorta is normal caliber. No mediastinal, hilar, or axillary adenopathy. Chest wall soft tissues are unremarkable.  CT ABDOMEN  FINDINGS  Postsurgical changes from fundoplication at the proximal stomach. There is a surgical drain in place entering in the lower abdomen, coursing superiorly near the postoperative site at the proximal stomach. This in appears to pass through the hiatal hernia defect in into the posterior mediastinum of the lower chest. There appears to be a focal fluid collection in the posterior mediastinum of the lower chest adjacent to this drainage catheter. This may represent postoperative fluid. This is difficult to separate from the adjacent pleural effusions but measures approximately 8.0 x 3.7 cm. No gas or contrast material noted within this fluid  collection.  Liver, gallbladder, spleen, pancreas, adrenals and kidneys are unremarkable. Subcutaneous gas noted throughout the anterior and lateral abdominal wall compatible with recent surgery. Aorta is normal caliber. No acute bony abnormality. Degenerative changes in the visualized lumbar spine.  IMPRESSION: Small bilateral pleural effusions with compressive atelectasis in the lower lobes.  Posterior mediastinal fluid collection adjacent to the diaphragmatic hiatus. An abdominal drain passes through the hiatus and into this fluid collection. This may represent postoperative fluid collection but cannot completely exclude leak. No gas or contrast material within this fluid collection.  Small amount of pneumomediastinum. Subcutaneous gas. Findings compatible with recent surgery.   Electronically Signed   By: Rolm Baptise M.D.   On: 12/10/2013 11:48   Dg Duanne Limerick W/water Sol Cm  12/09/2013   CLINICAL DATA:  Status post hiatal hernia repair  EXAM: WATER SOLUBLE UPPER GI SERIES  TECHNIQUE: Single-column upper GI series was performed using water soluble contrast.  CONTRAST:  50 cc Omni 300  COMPARISON:  None.  FLUOROSCOPY TIME:  56 seconds  FINDINGS: The patient ingested water-soluble contrast material without difficulty. Contrast passed through the distal esophagus and through the fundoplication wrap without evidence of contrast extravasation. There is adequate opacification of the mid and distal stomach with passage of contrast material  into the proximal small bowel loops.  IMPRESSION: 1. Intact fundoplication wrap without evidence for contrast extravasation. These results will be called to the ordering clinician or representative by the Radiologist Assistant, and communication documented in the PACS Dashboard.   Electronically Signed   By: Kerby Moors M.D.   On: 12/09/2013 12:27    Anti-infectives: Anti-infectives   Start     Dose/Rate Route Frequency Ordered Stop   12/08/13 0730  ceFAZolin (ANCEF) IVPB 2  g/50 mL premix     2 g 100 mL/hr over 30 Minutes Intravenous  Once 12/08/13 9357 12/08/13 0715      Assessment Active Problems:   Hiatal hernia s/p lap repair with Nissen fundoplication-significantly better today.    LOS: 3 days   Plan: Discharge.  Instructions given.  Interpreter present.   Melissa Murphy 12/11/2013

## 2013-12-14 NOTE — Discharge Summary (Signed)
Physician Discharge Summary  Patient ID: Melissa Murphy MRN: [id number] DOB/AGE: 05-07-1936 78 y.o.  Admit date: 12/08/2013 Discharge date: 12/11/2013  Admission Diagnoses:  Large hiatal hernia  Discharge Diagnoses:  Active Problems:   Hiatal hernia s/p laparoscopic repair with Nissen fundoplication 02/20/61     Discharged Condition: good  Hospital Course: She was admitted and underwent the above operation by Dr. Dalbert Batman. On postoperative day one an upper GI was done which demonstrated no evidence of leak. She was started on a clear liquid diet. She spiked a fever and had some tachycardia postoperatively #2. A CT scan of the chest and upper abdomen demonstrate some atelectasis, a seroma with a drain in it, but no evidence of leak or surgical complication. With ambulation and incentive spirometry the fever resolved. She was advanced to a full liquid diet. She was tolerating this. By her third postoperative day she met discharge criteria, her drain was removed, and she was discharged home. She is deaf and during her stay interpreters were used to communicate with her. She was given specific discharge instructions.  Consults: None  Significant Diagnostic Studies: none  Treatments: surgery: Laparoscopic hiatal hernia repair and Nissen fundoplication.  Discharge Exam: Blood pressure 144/68, pulse 94, temperature 99.2 F (37.3 C), temperature source Oral, resp. rate 15, height _0  (1.626 m), weight 216 lb 13.2 oz (98.35 kg), SpO2 98.00%.   Disposition: 01-Home or Self Care   Future Appointments Provider Department Dept Phone   12/25/2013 8:30 AM [name], MD [facility], [place]   08/17/2014 1:30 PM Hayden Pedro, MD [place] [phone]       Medication List    STOP taking these medications       omeprazole 20 MG capsule  Commonly known as:  PRILOSEC      TAKE these medications       aspirin 81 MG tablet   Take 81 mg by mouth every morning.     cloNIDine 0.1 MG tablet  Commonly known as:  CATAPRES  Take 0.1 mg by mouth 2 (two) times daily.     docusate sodium 100 MG capsule  Commonly known as:  COLACE  Take 100 mg by mouth daily as needed for mild constipation.     dorzolamide-timolol 22.3-6.8 MG/ML ophthalmic solution  Commonly known as:  COSOPT  Place 1 drop into the right eye 2 (two) times daily.     furosemide 20 MG tablet  Commonly known as:  LASIX  Take 1 tablet (20 mg total) by mouth every morning.     metoprolol 50 MG tablet  Commonly known as:  LOPRESSOR  Take 1 tablet (50 mg total) by mouth 2 (two) times daily.     oxyCODONE 5 MG immediate release tablet  Commonly known as:  Oxy IR/ROXICODONE  Take 1 tablet (5 mg total) by mouth every 6 (six) hours as needed.     ramipril 10 MG capsule  Commonly known as:  ALTACE  Take 10 mg by mouth every morning.     simvastatin 20 MG tablet  Commonly known as:  ZOCOR  Take 20 mg by mouth every evening.     tolterodine 2 MG tablet  Commonly known as:  DETROL  Take 2 mg by mouth 2 (two) times daily.     zolpidem 10 MG tablet  Commonly known as:  AMBIEN  Take 10 mg by mouth at bedtime. For sleep  Signed: Rhunette Croft Waldron Gerry 12/14/2013, 10:26 AM

## 2013-12-24 ENCOUNTER — Ambulatory Visit (INDEPENDENT_AMBULATORY_CARE_PROVIDER_SITE_OTHER): Payer: Medicare Other | Admitting: General Surgery

## 2013-12-24 ENCOUNTER — Encounter (INDEPENDENT_AMBULATORY_CARE_PROVIDER_SITE_OTHER): Payer: Self-pay | Admitting: General Surgery

## 2013-12-24 VITALS — BP 114/68 | HR 72 | Resp 18 | Ht 64.0 in | Wt 202.0 lb

## 2013-12-24 DIAGNOSIS — K3189 Other diseases of stomach and duodenum: Secondary | ICD-10-CM

## 2013-12-24 DIAGNOSIS — K319 Disease of stomach and duodenum, unspecified: Secondary | ICD-10-CM

## 2013-12-24 NOTE — Progress Notes (Signed)
Patient ID: Melissa Murphy, female   DOB: [birth year], 78 y.o.   MRN: [id number] History: This patient underwent reduction of a giant, type III hiatal hernia, diaphragmatic closure, and Nissen fundoplication on 03/50/0938. She is doing well. She is still eating fairly soft foods but is having no dysphagia, chest pain, nausea or vomiting. She thinks she is doing well. She has no bowel pain.  Exam: Patient is deaf and uses sign language. Interpreter is with her. She is in no distress. She appears happy. BP 114/68. Pulse 72. Abdomen soft. Nontender. Wounds looked good. Not distended  Assessment: Diet, type III hiatal hernia, doing well in the early postoperative period  following repair and antireflux procedure Hypertension History DVT left leg 2008 Sleep apnea Deaf  Plan:   advance diet. We had a long discussion about this. She has the patient information sheet. Schedule upper GI with barium in 5 weeks Return to see me 6 weeks. Increasing ambulation recommended.   Edsel Petrin. Dalbert Batman, M.D., [facility] General and Minimally invasive Surgery Breast and Colorectal Surgery Office:   [phone] Pager:   [phone]

## 2013-12-24 NOTE — Patient Instructions (Signed)
You appear to be doing very well following surgery to repair your giant hiatal hernia.  Drink lots of fluids.  Eat slowly.  Advance your diet as we discussed. You should be eating all of your usual foods within 4-6 weeks. Maybe sooner.  You will be scheduled for a barium upper GI in about 5 weeks.  Return to see Dr. Dalbert Batman in 6 weeks.

## 2013-12-24 NOTE — Addendum Note (Signed)
Addended by: Illene Regulus on: 12/24/2013 02:20 PM   Modules accepted: Orders

## 2013-12-25 ENCOUNTER — Telehealth (INDEPENDENT_AMBULATORY_CARE_PROVIDER_SITE_OTHER): Payer: Self-pay | Admitting: *Deleted

## 2013-12-25 ENCOUNTER — Encounter (INDEPENDENT_AMBULATORY_CARE_PROVIDER_SITE_OTHER): Payer: Medicare Other | Admitting: General Surgery

## 2013-12-25 NOTE — Telephone Encounter (Signed)
LM for pt to return my call.  Please advise pt of her DG Esophagus 6.23.15 @ 9:15 a.m. [employer] is to be NPO 3 hours prior to the test.

## 2014-01-06 ENCOUNTER — Telehealth (INDEPENDENT_AMBULATORY_CARE_PROVIDER_SITE_OTHER): Payer: Self-pay

## 2014-01-06 NOTE — Telephone Encounter (Signed)
Miralax 2-3 times a day until has BM's, then daily.  hmi

## 2014-01-06 NOTE — Telephone Encounter (Signed)
Pt s/p lap hiatal hernia repair on 12/03/13. Pts daughter states that she has not had a BM since 01/02/14/ Advised pt per protocol that she can take [place] as directed on the label, increase fluid/water intake and to have the pt to move around as much as possible. Informed pt that I would send Dr Dalbert Batman a message to see if he has any further recommendations. Pts daughter verbalized understanding and agrees with POC.

## 2014-01-06 NOTE — Telephone Encounter (Signed)
Informed pt to take Miralax 2-3 a day until she has a BM, then go daily. Pt verbalized understanding and agrees with POC.

## 2014-01-09 ENCOUNTER — Other Ambulatory Visit: Payer: Self-pay | Admitting: Family Medicine

## 2014-01-20 DIAGNOSIS — H35359 Cystoid macular degeneration, unspecified eye: Secondary | ICD-10-CM | POA: Diagnosis not present

## 2014-01-20 DIAGNOSIS — H40059 Ocular hypertension, unspecified eye: Secondary | ICD-10-CM | POA: Diagnosis not present

## 2014-01-20 DIAGNOSIS — Z961 Presence of intraocular lens: Secondary | ICD-10-CM | POA: Diagnosis not present

## 2014-01-20 DIAGNOSIS — H353190 Nonexudative age-related macular degeneration, unspecified eye, stage unspecified: Secondary | ICD-10-CM | POA: Diagnosis not present

## 2014-01-20 DIAGNOSIS — H353290 Exudative age-related macular degeneration, unspecified eye, stage unspecified: Secondary | ICD-10-CM | POA: Diagnosis not present

## 2014-01-22 ENCOUNTER — Encounter (INDEPENDENT_AMBULATORY_CARE_PROVIDER_SITE_OTHER): Payer: Medicare Other | Admitting: Ophthalmology

## 2014-01-22 DIAGNOSIS — H348392 Tributary (branch) retinal vein occlusion, unspecified eye, stable: Secondary | ICD-10-CM

## 2014-01-22 DIAGNOSIS — I10 Essential (primary) hypertension: Secondary | ICD-10-CM

## 2014-01-22 DIAGNOSIS — H43819 Vitreous degeneration, unspecified eye: Secondary | ICD-10-CM

## 2014-01-22 DIAGNOSIS — H35039 Hypertensive retinopathy, unspecified eye: Secondary | ICD-10-CM

## 2014-02-03 ENCOUNTER — Ambulatory Visit (HOSPITAL_COMMUNITY): Payer: Medicare Other

## 2014-02-04 ENCOUNTER — Ambulatory Visit (HOSPITAL_COMMUNITY)
Admission: RE | Admit: 2014-02-04 | Discharge: 2014-02-04 | Disposition: A | Discharge status: Home / Self Care | Payer: Medicare Other | Source: Ambulatory Visit | Attending: General Surgery | Admitting: General Surgery

## 2014-02-04 DIAGNOSIS — Z09 Encounter for follow-up examination after completed treatment for conditions other than malignant neoplasm: Secondary | ICD-10-CM | POA: Diagnosis not present

## 2014-02-04 DIAGNOSIS — K449 Diaphragmatic hernia without obstruction or gangrene: Secondary | ICD-10-CM | POA: Diagnosis not present

## 2014-02-04 DIAGNOSIS — K3189 Other diseases of stomach and duodenum: Secondary | ICD-10-CM

## 2014-02-09 DIAGNOSIS — I8310 Varicose veins of unspecified lower extremity with inflammation: Secondary | ICD-10-CM | POA: Diagnosis not present

## 2014-02-10 ENCOUNTER — Encounter (INDEPENDENT_AMBULATORY_CARE_PROVIDER_SITE_OTHER): Payer: Self-pay | Admitting: General Surgery

## 2014-02-10 ENCOUNTER — Ambulatory Visit (INDEPENDENT_AMBULATORY_CARE_PROVIDER_SITE_OTHER): Payer: Medicare Other | Admitting: General Surgery

## 2014-02-10 VITALS — BP 130/80 | HR 63 | Temp 98.4°F | Resp 14 | Ht 64.0 in | Wt 198.6 lb

## 2014-02-10 DIAGNOSIS — K3189 Other diseases of stomach and duodenum: Secondary | ICD-10-CM

## 2014-02-10 DIAGNOSIS — K319 Disease of stomach and duodenum, unspecified: Secondary | ICD-10-CM

## 2014-02-10 NOTE — Progress Notes (Signed)
Patient ID: Melissa Murphy, female   DOB: Feb 08, 1936, 78 y.o.   MRN: [id number] History: This patient underwent reduction of a giant, type III hiatal hernia, diaphragmatic closure, and Nissen fundoplication on 85/92/9244. She is doing very well. She is swallowing  soft foods without any difficulty. She thinks the surgery as helped her a great deal. Denies abdominal pain Esophagogram looked normal. Fundoplication intact. No stricture. Good radiographic appearance  Exam: Patient is deaf. Uses sign language. Interpreter in room. No distress. Smiling. Abdomen soft and nontender. Wounds look good  Assessment: Type III hiatal hernia. Recovering uneventfully following reduction repair and antireflux procedure Hypertension History DVT left leg 2008 Sleep apnea Deafness  Plan: I told her how to advance her diet and to  swallow her food She may drive. No restrictions Return to see me as needed.    Edsel Petrin. Dalbert Batman, M.D., [facility] General and Minimally invasive Surgery Breast and Colorectal Surgery Office:   [phone] Pager:   [phone]

## 2014-02-10 NOTE — Patient Instructions (Signed)
You look wonderful. You have recovered from yoursurgery.  The swallowing x-ray looks normal.  You may slowly resume all the foods that you were able to eat before. Be sure to chew your food slowly and to swallow small bites. Be sure to drink a little water between bites.  There is no restriction on her activities. You may drive her car  Return to see Dr. Dalbert Batman if needed

## 2014-02-28 ENCOUNTER — Other Ambulatory Visit: Payer: Self-pay | Admitting: Emergency Medicine

## 2014-03-02 ENCOUNTER — Other Ambulatory Visit: Payer: Self-pay | Admitting: Internal Medicine

## 2014-03-02 NOTE — Progress Notes (Signed)
Pharmacy fax request  Last fill date 01/29/14.    Melinda [name], LVN

## 2014-03-09 ENCOUNTER — Ambulatory Visit (INDEPENDENT_AMBULATORY_CARE_PROVIDER_SITE_OTHER): Payer: Medicare Other | Admitting: Internal Medicine

## 2014-03-09 ENCOUNTER — Encounter: Payer: Self-pay | Admitting: Family Medicine

## 2014-03-09 VITALS — BP 132/78 | HR 48 | Temp 98.7°F | Resp 16 | Ht 62.5 in | Wt 198.0 lb

## 2014-03-09 DIAGNOSIS — R32 Unspecified urinary incontinence: Secondary | ICD-10-CM

## 2014-03-09 DIAGNOSIS — R300 Dysuria: Secondary | ICD-10-CM

## 2014-03-09 DIAGNOSIS — M25561 Pain in right knee: Secondary | ICD-10-CM

## 2014-03-09 DIAGNOSIS — R7309 Other abnormal glucose: Secondary | ICD-10-CM

## 2014-03-09 DIAGNOSIS — G4700 Insomnia, unspecified: Secondary | ICD-10-CM

## 2014-03-09 DIAGNOSIS — M7989 Other specified soft tissue disorders: Secondary | ICD-10-CM | POA: Diagnosis not present

## 2014-03-09 DIAGNOSIS — M25569 Pain in unspecified knee: Secondary | ICD-10-CM

## 2014-03-09 LAB — POCT CBC
GRANULOCYTE PERCENT: 50.8 % (ref 37–80)
HCT, POC: 42.7 % (ref 37.7–47.9)
Hemoglobin: 13.7 g/dL (ref 12.2–16.2)
Lymph, poc: 2.7 (ref 0.6–3.4)
MCH, POC: 29.8 pg (ref 27–31.2)
MCHC: 32.1 g/dL (ref 31.8–35.4)
MCV: 92.9 fL (ref 80–97)
MID (CBC): 0.4 (ref 0–0.9)
MPV: 7.9 fL (ref 0–99.8)
PLATELET COUNT, POC: 167 10*3/uL (ref 142–424)
POC GRANULOCYTE: 3.2 (ref 2–6.9)
POC LYMPH PERCENT: 42.2 %L (ref 10–50)
POC MID %: 7 %M (ref 0–12)
RBC: 4.6 M/uL (ref 4.04–5.48)
RDW, POC: 14 %
WBC: 6.3 10*3/uL (ref 4.6–10.2)

## 2014-03-09 LAB — POCT URINALYSIS DIPSTICK
Bilirubin, UA: NEGATIVE
Blood, UA: NEGATIVE
Glucose, UA: NEGATIVE
KETONES UA: NEGATIVE
Leukocytes, UA: NEGATIVE
NITRITE UA: NEGATIVE
PH UA: 5
PROTEIN UA: NEGATIVE
Spec Grav, UA: <=1.005
Urobilinogen, UA: 0.2

## 2014-03-09 LAB — POCT UA - MICROSCOPIC ONLY
CRYSTALS, UR, HPF, POC: NEGATIVE
Casts, Ur, LPF, POC: NEGATIVE
Mucus, UA: NEGATIVE
RBC, urine, microscopic: NEGATIVE
YEAST UA: NEGATIVE

## 2014-03-09 LAB — POCT GLYCOSYLATED HEMOGLOBIN (HGB A1C): HEMOGLOBIN A1C: 5.7

## 2014-03-09 MED ORDER — ZOLPIDEM TARTRATE 10 MG PO TABS: 10.0000 mg | ORAL_TABLET | Freq: Every day | ORAL | Status: DC

## 2014-03-09 NOTE — Progress Notes (Signed)
Subjective:    Patient ID: Melissa Murphy, female    DOB: 10/20/1935, 78 y.o.   MRN: [id number]  HPI This is a pleasant 78 yo deaf patient who is accompanied by her husband and an interpreter. The patient presents today for refills of medication and increased urinary incontinence. She is not sure which medications need to be filled.  Patient reports that she is leaking urine more often than before her hiatal hernia surgery 4/15. Has been to different doctors in [place] for this in the past and it seemed to get better until the last couple of months. She would like a referral to a urologist in [place]. Has to use pads all the time, is especially leaking at bedtime. Still taking Detrol which helps a little. She denies any protrusions from vagina or abdominal pain. States she drinks a lot of fluids which seem to "go right through me." She does have intermittent constipation for which she takes stool softener and/or miralax.  Has some pain in feet with a lot of walking. Takes meloxicam 1-3x month for stiffness in right knee. She has a history of bilateral leg swelling, but notes this has significantly improved following her [place] surgery.  Takes ambien rarely for insomnia.   She has had some intermittently elevated glucose readings in the past. She has never been diagnosed with diabetes. Her grandmother had diabetes.  Was supposed to set up an appointment with a female provider at 78 to establish care. She reports she did not receive the letter that was sent to her regarding this. She is still interested in being seen by a female provider at 78.   Review of Systems No dysuria, no hematuria, no odor, + urinary frequency, no fever/chills.No back pain.     Objective:   Physical Exam  Vitals reviewed. Constitutional: She is oriented to person, place, and time. She appears well-developed and well-nourished. No distress.  HENT:  Head: Normocephalic and atraumatic.  Eyes: Conjunctivae are  normal. Right eye exhibits no discharge. Left eye exhibits no discharge.  Neck: Normal range of motion. Neck supple.  Cardiovascular: Normal rate, regular rhythm and normal heart sounds.   Pulmonary/Chest: Effort normal and breath sounds normal.  Musculoskeletal: Normal range of motion. She exhibits edema (+2 pretibial edema).  Neurological: She is alert and oriented to person, place, and time.  Skin: Skin is warm and dry. Rash: pre tibial skin darkening. She is not diaphoretic.  Psychiatric: She has a normal mood and affect. Her behavior is normal. Judgment and thought content normal.   Results for orders placed in visit on 03/09/14  POCT UA - MICROSCOPIC ONLY      Result Value Ref Range   WBC, Ur, HPF, POC 0-1     RBC, urine, microscopic neg     Bacteria, U Microscopic trace     Mucus, UA neg     Epithelial cells, urine per micros 0-2     Crystals, Ur, HPF, POC neg     Casts, Ur, LPF, POC neg     Yeast, UA neg    POCT URINALYSIS DIPSTICK      Result Value Ref Range   Color, UA yellow     Clarity, UA clear     Glucose, UA neg     Bilirubin, UA neg     Ketones, UA neg     Spec Grav, UA <=1.005     Blood, UA neg     pH, UA 5.0     Protein,  UA neg     Urobilinogen, UA 0.2     Nitrite, UA neg     Leukocytes, UA Negative    POCT CBC      Result Value Ref Range   WBC 6.3  4.6 - 10.2 K/uL   Lymph, poc 2.7  0.6 - 3.4   POC LYMPH PERCENT 42.2  10 - 50 %L   MID (cbc) 0.4  0 - 0.9   POC MID % 7.0  0 - 12 %M   POC Granulocyte 3.2  2 - 6.9   Granulocyte percent 50.8  37 - 80 %G   RBC 4.60  4.04 - 5.48 M/uL   Hemoglobin 13.7  12.2 - 16.2 g/dL   HCT, POC 42.7  37.7 - 47.9 %   MCV 92.9  80 - 97 fL   MCH, POC 29.8  27 - 31.2 pg   MCHC 32.1  31.8 - 35.4 g/dL   RDW, POC 14.0     Platelet Count, POC 167  142 - 424 K/uL   MPV 7.9  0 - 99.8 fL  POCT GLYCOSYLATED HEMOGLOBIN (HGB A1C)      Result Value Ref Range   Hemoglobin A1C 5.7         Assessment & Plan:  1. Dysuria - POCT  UA - Microscopic Only - POCT urinalysis dipstick  2. Urinary incontinence, unspecified incontinence type -UA without infection - Comprehensive metabolic panel - Ambulatory referral to Urology  3. Leg swelling -patient reports this is improved - POCT CBC - Comprehensive metabolic panel  4. Elevated glucose - POCT glycosylated hemoglobin (Hb A1C) - Comprehensive metabolic panel  5. Right knee pain -Has prescription for meloxicam- encouraged her to use sparingly  6. Insomnia - zolpidem (AMBIEN) 10 MG tablet; Take 1 tablet (10 mg total) by mouth at bedtime. For sleep  Dispense: 15 tablet; Refill: 0  Patient will see Dr. Tamala Julian 03/23/2014 at the appointment center  Elby Beck, FNP-BC  [facility]  03/09/2014 7:18 PM I have completed the patient encounter in its entirety as documented by FNP Carlean Purl, with editing by me where necessary. Robert P. Laney Pastor, M.D.

## 2014-03-09 NOTE — Patient Instructions (Signed)
Keep up the good work with exercise and weight loss! Urinary Incontinence Urinary incontinence is the involuntary loss of urine from your bladder. CAUSES  There are many causes of urinary incontinence. They include:  Medicines.  Infections.  Prostatic enlargement, leading to overflow of urine from your bladder.  Surgery.  Neurological diseases.  Emotional factors. SIGNS AND SYMPTOMS Urinary Incontinence can be divided into four types: 1. Urge incontinence. Urge incontinence is the involuntary loss of urine before you have the opportunity to go to the bathroom. There is a sudden urge to void but not enough time to reach a bathroom. 2. Stress incontinence. Stress incontinence is the sudden loss of urine with any activity that forces urine to pass. It is commonly caused by anatomical changes to the pelvis and sphincter areas of your body. 3. Overflow incontinence. Overflow incontinence is the loss of urine from an obstructed opening to your bladder. This results in a backup of urine and a resultant buildup of pressure within the bladder. When the pressure within the bladder exceeds the closing pressure of the sphincter, the urine overflows, which causes incontinence, similar to water overflowing a dam. 4. Total incontinence. Total incontinence is the loss of urine as a result of the inability to store urine within your bladder. DIAGNOSIS  Evaluating the cause of incontinence may require:  A thorough and complete medical and obstetric history.  A complete physical exam.  Laboratory tests such as a urine culture and sensitivities. When additional tests are indicated, they can include:  An ultrasound exam.  Kidney and bladder X-rays.  Cystoscopy. This is an exam of the bladder using a narrow scope.  Urodynamic testing to test the nerve function to the bladder and sphincter areas. TREATMENT  Treatment for urinary incontinence depends on the cause:  For urge incontinence caused by  a bacterial infection, antibiotics will be prescribed. If the urge incontinence is related to medicines you take, your health care provider may have you change the medicine.  For stress incontinence, surgery to re-establish anatomical support to the bladder or sphincter, or both, will often correct the condition.  For overflow incontinence caused by an enlarged prostate, an operation to open the channel through the enlarged prostate will allow the flow of urine out of the bladder. In women with fibroids, a hysterectomy may be recommended.  For total incontinence, surgery on your urinary sphincter may help. An artificial urinary sphincter (an inflatable cuff placed around the urethra) may be required. In women who have developed a hole-like passage between their bladder and vagina (vesicovaginal fistula), surgery to close the fistula often is required. HOME CARE INSTRUCTIONS  Normal daily hygiene and the use of pads or adult diapers that are changed regularly will help prevent odors and skin damage.  Avoid caffeine. It can overstimulate your bladder.  Use the bathroom regularly. Try about every 2-3 hours to go to the bathroom, even if you do not feel the need to do so. Take time to empty your bladder completely. After urinating, wait a minute. Then try to urinate again.  For causes involving nerve dysfunction, keep a log of the medicines you take and a journal of the times you go to the bathroom. SEEK MEDICAL CARE IF:  You experience worsening of pain instead of improvement in pain after your procedure.  Your incontinence becomes worse instead of better. SEE IMMEDIATE MEDICAL CARE IF:  You experience fever or shaking chills.  You are unable to pass your urine.  You have redness spreading  into your groin or down into your thighs. MAKE SURE YOU:   Understand these instructions.   Will watch your condition.  Will get help right away if you are not doing well or get worse. Document  Released: 09/07/2004 Document Revised: 05/21/2013 Document Reviewed: 01/07/2013 [facility] Patient Information 2015 [place], [place]. This information is not intended to replace advice given to you by your health care provider. Make sure you discuss any questions you have with your health care provider.

## 2014-03-10 LAB — COMPREHENSIVE METABOLIC PANEL
ALK PHOS: 62 U/L (ref 39–117)
ALT: 15 U/L (ref 0–35)
AST: 23 U/L (ref 0–37)
Albumin: 4.1 g/dL (ref 3.5–5.2)
BILIRUBIN TOTAL: 0.4 mg/dL (ref 0.2–1.2)
BUN: 19 mg/dL (ref 6–23)
CO2: 26 mEq/L (ref 19–32)
Calcium: 9.6 mg/dL (ref 8.4–10.5)
Chloride: 104 mEq/L (ref 96–112)
Creat: 0.87 mg/dL (ref 0.50–1.10)
GLUCOSE: 90 mg/dL (ref 70–99)
POTASSIUM: 4.3 meq/L (ref 3.5–5.3)
SODIUM: 140 meq/L (ref 135–145)
TOTAL PROTEIN: 7.3 g/dL (ref 6.0–8.3)

## 2014-03-11 ENCOUNTER — Other Ambulatory Visit: Payer: Self-pay | Admitting: Internal Medicine

## 2014-03-11 ENCOUNTER — Telehealth: Payer: Self-pay | Admitting: Family Medicine

## 2014-03-11 MED ORDER — ZOLPIDEM TARTRATE 10 MG PO TABS: 10.0000 mg | ORAL_TABLET | Freq: Every day | ORAL | Status: DC

## 2014-03-11 MED ORDER — MELOXICAM 15 MG PO TABS: 15.0000 mg | ORAL_TABLET | Freq: Every day | ORAL | Status: DC

## 2014-03-11 NOTE — Addendum Note (Signed)
Addended by: Orion Crook on: 03/11/2014 03:09 PM   Modules accepted: Orders

## 2014-03-11 NOTE — Telephone Encounter (Signed)
Patient states needs refill on meloxicam please advise

## 2014-03-15 ENCOUNTER — Other Ambulatory Visit: Payer: Self-pay | Admitting: Internal Medicine

## 2014-03-20 ENCOUNTER — Other Ambulatory Visit: Payer: Self-pay | Admitting: Internal Medicine

## 2014-03-20 ENCOUNTER — Other Ambulatory Visit: Payer: Self-pay | Admitting: Family Medicine

## 2014-03-21 NOTE — Telephone Encounter (Signed)
Left message on machine to call back to see if she has enough meds to last until her appt 03/21/14

## 2014-03-23 ENCOUNTER — Ambulatory Visit: Payer: Medicare Other | Admitting: Family Medicine

## 2014-03-23 DIAGNOSIS — R39198 Other difficulties with micturition: Secondary | ICD-10-CM | POA: Diagnosis not present

## 2014-03-23 DIAGNOSIS — N3946 Mixed incontinence: Secondary | ICD-10-CM | POA: Diagnosis not present

## 2014-03-23 DIAGNOSIS — N3944 Nocturnal enuresis: Secondary | ICD-10-CM | POA: Diagnosis not present

## 2014-03-25 ENCOUNTER — Telehealth: Payer: Self-pay

## 2014-03-25 NOTE — Telephone Encounter (Signed)
Pt called back to return mssg advising her to call back and ask for a team lead

## 2014-03-26 ENCOUNTER — Other Ambulatory Visit: Payer: Self-pay | Admitting: Physician Assistant

## 2014-03-27 ENCOUNTER — Telehealth: Payer: Self-pay

## 2014-03-27 NOTE — Telephone Encounter (Signed)
PA needed for zolpidem. Contacted daughter who asked pt and advised that she tried OTC meds for sleep and they gave her a "hang over effect" in mornings. The lower dose of 5 mg zolpidem was not effective. She has been taking the zolpidem 10 mg for many years w/out SEs. Completed PA on covermymeds. Pending.

## 2014-03-30 DIAGNOSIS — N3944 Nocturnal enuresis: Secondary | ICD-10-CM | POA: Diagnosis not present

## 2014-03-30 DIAGNOSIS — N3946 Mixed incontinence: Secondary | ICD-10-CM | POA: Diagnosis not present

## 2014-03-31 NOTE — Telephone Encounter (Signed)
PA approved from 01/26/14 - 03/27/15. Notified pharmacy. Pt is listed on insurance by married name, Melissa Murphy.

## 2014-04-01 ENCOUNTER — Encounter: Payer: Self-pay | Admitting: Family Medicine

## 2014-04-01 ENCOUNTER — Ambulatory Visit (INDEPENDENT_AMBULATORY_CARE_PROVIDER_SITE_OTHER): Payer: Medicare Other | Admitting: Family Medicine

## 2014-04-01 VITALS — BP 138/90 | HR 56 | Temp 97.8°F | Resp 16 | Ht 67.0 in | Wt 199.2 lb

## 2014-04-01 DIAGNOSIS — Z23 Encounter for immunization: Secondary | ICD-10-CM | POA: Diagnosis not present

## 2014-04-01 DIAGNOSIS — Z1239 Encounter for other screening for malignant neoplasm of breast: Secondary | ICD-10-CM | POA: Diagnosis not present

## 2014-04-01 DIAGNOSIS — M1710 Unilateral primary osteoarthritis, unspecified knee: Secondary | ICD-10-CM

## 2014-04-01 DIAGNOSIS — N3941 Urge incontinence: Secondary | ICD-10-CM | POA: Diagnosis not present

## 2014-04-01 DIAGNOSIS — R609 Edema, unspecified: Secondary | ICD-10-CM

## 2014-04-01 DIAGNOSIS — G4700 Insomnia, unspecified: Secondary | ICD-10-CM

## 2014-04-01 DIAGNOSIS — Z7189 Other specified counseling: Secondary | ICD-10-CM

## 2014-04-01 DIAGNOSIS — H9190 Unspecified hearing loss, unspecified ear: Secondary | ICD-10-CM

## 2014-04-01 DIAGNOSIS — I10 Essential (primary) hypertension: Secondary | ICD-10-CM | POA: Diagnosis not present

## 2014-04-01 DIAGNOSIS — Z1231 Encounter for screening mammogram for malignant neoplasm of breast: Secondary | ICD-10-CM

## 2014-04-01 DIAGNOSIS — Z7689 Persons encountering health services in other specified circumstances: Secondary | ICD-10-CM

## 2014-04-01 DIAGNOSIS — H9193 Unspecified hearing loss, bilateral: Secondary | ICD-10-CM

## 2014-04-01 DIAGNOSIS — M1712 Unilateral primary osteoarthritis, left knee: Secondary | ICD-10-CM
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
## 2014-04-03 DIAGNOSIS — H9190 Unspecified hearing loss, unspecified ear: Secondary | ICD-10-CM | POA: Insufficient documentation

## 2014-04-12 ENCOUNTER — Telehealth: Payer: Self-pay

## 2014-04-12 NOTE — Telephone Encounter (Signed)
Pt is out of [place] and needs insurance approval for a new rx-the last refill was only 15 days worth because that is all the pharmacy has

## 2014-04-14 DIAGNOSIS — N3944 Nocturnal enuresis: Secondary | ICD-10-CM | POA: Diagnosis not present

## 2014-04-14 DIAGNOSIS — N3946 Mixed incontinence: Secondary | ICD-10-CM | POA: Diagnosis not present

## 2014-04-14 NOTE — Telephone Encounter (Signed)
This PA was approved on 03/31/14 for nightly use (#30 for 30 days.), and I notified pharm at that time. Notified daughter on VM that this had been done and pharm was notified. Notified pharm of approval on 18th and that they should call pharm help desk if they still can not go through.

## 2014-04-15 ENCOUNTER — Encounter: Payer: Self-pay | Admitting: Internal Medicine

## 2014-04-21 ENCOUNTER — Other Ambulatory Visit: Payer: Self-pay | Admitting: Internal Medicine

## 2014-06-04 DIAGNOSIS — N3946 Mixed incontinence: Secondary | ICD-10-CM | POA: Diagnosis not present

## 2014-06-04 DIAGNOSIS — N3944 Nocturnal enuresis: Secondary | ICD-10-CM | POA: Diagnosis not present

## 2014-06-10 ENCOUNTER — Ambulatory Visit (INDEPENDENT_AMBULATORY_CARE_PROVIDER_SITE_OTHER): Payer: Medicare Other | Admitting: Family Medicine

## 2014-06-10 ENCOUNTER — Ambulatory Visit (HOSPITAL_BASED_OUTPATIENT_CLINIC_OR_DEPARTMENT_OTHER)
Admission: RE | Admit: 2014-06-10 | Discharge: 2014-06-10 | Disposition: A | Discharge status: Home / Self Care | Payer: Medicare Other | Source: Ambulatory Visit | Attending: Family Medicine | Admitting: Family Medicine

## 2014-06-10 VITALS — BP 120/80 | HR 55 | Temp 98.9°F | Resp 20 | Ht 64.0 in | Wt 205.0 lb

## 2014-06-10 DIAGNOSIS — M7989 Other specified soft tissue disorders: Secondary | ICD-10-CM | POA: Diagnosis not present

## 2014-06-10 DIAGNOSIS — M25475 Effusion, left foot: Secondary | ICD-10-CM | POA: Diagnosis not present

## 2014-06-10 DIAGNOSIS — I951 Orthostatic hypotension: Secondary | ICD-10-CM | POA: Diagnosis not present

## 2014-06-10 DIAGNOSIS — Z86718 Personal history of other venous thrombosis and embolism: Secondary | ICD-10-CM | POA: Insufficient documentation

## 2014-06-10 DIAGNOSIS — R531 Weakness: Secondary | ICD-10-CM | POA: Diagnosis not present

## 2014-06-10 DIAGNOSIS — R42 Dizziness and giddiness: Secondary | ICD-10-CM | POA: Diagnosis not present

## 2014-06-10 LAB — POCT CBC
Granulocyte percent: 44.6 %G (ref 37–80)
HCT, POC: 44.1 % (ref 37.7–47.9)
Hemoglobin: 13.8 g/dL (ref 12.2–16.2)
Lymph, poc: 3.2 (ref 0.6–3.4)
MCH, POC: 29.8 pg (ref 27–31.2)
MCHC: 31.2 g/dL — AB (ref 31.8–35.4)
MCV: 95.4 fL (ref 80–97)
MID (cbc): 0.5 (ref 0–0.9)
MPV: 7.3 fL (ref 0–99.8)
POC Granulocyte: 3 (ref 2–6.9)
POC LYMPH PERCENT: 47.8 %L (ref 10–50)
POC MID %: 7.6 % (ref 0–12)
Platelet Count, POC: 212 10*3/uL (ref 142–424)
RBC: 4.62 M/uL (ref 4.04–5.48)
RDW, POC: 14.9 %
WBC: 6.7 10*3/uL (ref 4.6–10.2)

## 2014-06-10 LAB — POCT UA - MICROSCOPIC ONLY
Casts, Ur, LPF, POC: NEGATIVE
Crystals, Ur, HPF, POC: NEGATIVE
Mucus, UA: NEGATIVE
Yeast, UA: NEGATIVE

## 2014-06-10 LAB — POCT URINALYSIS DIPSTICK
Bilirubin, UA: NEGATIVE
Blood, UA: NEGATIVE
Glucose, UA: NEGATIVE
Ketones, UA: NEGATIVE
Leukocytes, UA: NEGATIVE
Nitrite, UA: NEGATIVE
Protein, UA: NEGATIVE
Spec Grav, UA: 1.02
Urobilinogen, UA: 0.2
pH, UA: 5

## 2014-06-10 NOTE — Progress Notes (Signed)
Chief Complaint:  Chief Complaint  Patient presents with  . Dizziness    DIZZY/WEAKNESS x 1 week.  wants to be checked for anemia and blood clot in her leg.  pt had anemia 7-8 years ago.     HPI: Melissa Murphy is a 78 y.o. female who is here for : 7 day history of dizziness,, left foot swelling x 2-3 days. Hx of blood clot 7-8 years ago.  No CP or SOP or palpitations. Dizziness does not occure with head movement, she has it with position cahnges and rapid movement. No recent URI sxs. No ear ringing,no HA vison cahnges or CP . No hx of vertigo She has been on new incontinence sample meds but has been on it for 1 month. Did not take it today, dizziness is only with movement , positional. IF she is slow then she does have any dizziness. She is on BP meds for HTN and they are: clonidine, altace, lopressor, lasix She has been complaint with all meds, has not missed a single dose  She has had left foot swelling, no calf swelling, she has had calf pain only when you touch it for the last 2-3 days. She has a hx of prior blood clot, she has varicose veins, she has a hx of anemia, she deneis any recent long travels in cars or planes, recent surgeries, recent injures to her foot.   BP Readings from Last 3 Encounters:  06/10/14 120/80  04/01/14 138/90  03/09/14 132/78      Past Medical History  Diagnosis Date  . GERD (gastroesophageal reflux disease)   . Hypertension   . Hyperlipidemia   . Glaucoma   . Urinary incontinence   . Edema   . Anemia   . Arthritis   . DVT (deep venous thrombosis)     in eye, and leg  . Osteoporosis   . Ulcer   . Hernia   . Deaf     Requires an interpreter  . Uterine polyp   . Hiatal hernia   . Sleep apnea   . Clotting disorder   . Thyroid disease   . Cataract    Past Surgical History  Procedure Laterality Date  . Cataract extraction      both eyes  . Goiter removed      age 55  . Xanthoma tumor removed    . Refractive surgery    .  Dilatation & currettage/hysteroscopy with resectocope N/A 10/11/2012    Procedure: DILATATION & CURETTAGE/HYSTEROSCOPY WITH RESECTOCOPE;  Surgeon: Allena Katz, MD;  Location: [facility];  Service: Gynecology;  Laterality: N/A;  pt is deaf; please contact daughter Heide Spark [phone]  . Eye surgery    . Hernia repair    . Tonsillectomy    . Hiatal hernia repair N/A 12/08/2013    Procedure: LAPAROSCOPIC REPAIR OF HIATAL HERNIA  ;  Surgeon: [name], MD;  Location: [facility];  Service: General;  Laterality: N/A;   History   Social History  . Marital Status: Married    Spouse Name: N/A    Number of Children: 3  . Years of Education: Grad    Occupational History  . Teacher- Retired    Social History Main Topics  . Smoking status: Never Smoker   . Smokeless tobacco: Never Used  . Alcohol Use: No     Comment: occasionally  . Drug Use: No  . Sexual Activity: None   Other Topics Concern  .  None   Social History Narrative  . None   Family History  Problem Relation Age of Onset  . Esophageal cancer Neg Hx   . Rectal cancer Neg Hx   . Stomach cancer Neg Hx   . Cancer Mother     colon  . Stroke Mother   . Heart disease Mother   . Hypertension Mother   . Heart disease Father     32 yrs old  . Heart disease Brother     MI,   . Hernia Brother   . Hyperlipidemia Brother   . Migraines Daughter   . Heart disease Paternal Grandmother    Allergies  Allergen Reactions  . Avastin [Bevacizumab] Nausea And Vomiting  . Clindamycin/Lincomycin Itching  . Novocain [Procaine] Other (See Comments)    seizures  . Sulfa Antibiotics Other (See Comments)    fever   Prior to Admission medications   Medication Sig Start Date End Date Taking? Authorizing Provider  aspirin 81 MG tablet Take 81 mg by mouth every morning.    Yes Historical Provider, MD  cloNIDine (CATAPRES) 0.1 MG tablet TAKE 1 TABLET BY MOUTH TWICE A DAY   Yes Carliyah Cotterman P Andreah Goheen, DO  docusate sodium (COLACE) 100 MG capsule  Take 100 mg by mouth daily as needed for mild constipation.   Yes Historical Provider, MD  dorzolamide-timolol (COSOPT) 22.3-6.8 MG/ML ophthalmic solution Place 1 drop into the right eye 2 (two) times daily.   Yes Historical Provider, MD  furosemide (LASIX) 20 MG tablet TAKE 1 TABLET (20 MG TOTAL) BY MOUTH EVERY MORNING.   Yes Leandrew Koyanagi, MD  meloxicam (MOBIC) 15 MG tablet Take 1 tablet (15 mg total) by mouth daily. 03/11/14  Yes Leandrew Koyanagi, MD  metoprolol (LOPRESSOR) 50 MG tablet TAKE 1 TABLET (50 MG TOTAL) BY MOUTH 2 (TWO) TIMES DAILY. 04/21/14  Yes Wardell Honour, MD  ramipril (ALTACE) 10 MG capsule Take 10 mg by mouth every morning.   Yes Historical Provider, MD  ramipril (ALTACE) 10 MG capsule TAKE 1 CAPSULE (10 MG TOTAL) BY MOUTH DAILY. 04/21/14  Yes Wardell Honour, MD  simvastatin (ZOCOR) 20 MG tablet Take 20 mg by mouth every evening.   Yes Historical Provider, MD  tolterodine (DETROL) 2 MG tablet Take 2 mg by mouth 2 (two) times daily.    Yes Historical Provider, MD  zolpidem (AMBIEN) 10 MG tablet TAKE 1 TABLET AT BEDTIME   Yes Leandrew Koyanagi, MD     ROS: The patient denies fevers, chills, night sweats, unintentional weight loss, chest pain, palpitations, wheezing, dyspnea on exertion, nausea, vomiting, abdominal pain, dysuria, hematuria, melena, numbness, weakness, or tingling.   All other systems have been reviewed and were otherwise negative with the exception of those mentioned in the HPI and as above.    PHYSICAL EXAM: Filed Vitals:   06/10/14 1855  BP: 120/80  Pulse: 55  Temp: 98.9 F (37.2 C)  Resp: 20   Filed Vitals:   06/10/14 1855  Height: 5\' 4"  (1.626 m)  Weight: 205 lb (92.987 kg)   Body mass index is 35.17 kg/(m^2).  General: Alert, no acute distress HEENT:  Normocephalic, atraumatic, oropharynx patent. EOMI, PERRLA Cardiovascular:  Regular rate and rhythm, no rubs murmurs or gallops.  No Carotid bruits, radial pulse intact. No pedal edema.    Respiratory: Clear to auscultation bilaterally.  No wheezes, rales, or rhonchi.  No cyanosis, no use of accessory musculature GI: No organomegaly, abdomen is soft and  non-tender, positive bowel sounds.  No masses. Skin: No rashes. Neurologic: Facial musculature symmetric. CN 2-12 grossly normal  Psychiatric: Patient is appropriate throughout our interaction. Lymphatic: No cervical lymphadenopathy Musculoskeletal: Gait intact. Let foot edema, it is only in the top part of the foot, she ahs calf pain but she ahs large varicose veins as well. The foot has good warmth and cap refill. I am unablet o illicit a DP due to swelling but it does not look ischemic.    LABS: Results for orders placed in visit on 06/10/14  POCT UA - MICROSCOPIC ONLY      Result Value Ref Range   WBC, Ur, HPF, POC 2-4     RBC, urine, microscopic 0-2     Bacteria, U Microscopic 1+     Mucus, UA neg     Epithelial cells, urine per micros 3-6     Crystals, Ur, HPF, POC neg     Casts, Ur, LPF, POC neg     Yeast, UA neg    POCT URINALYSIS DIPSTICK      Result Value Ref Range   Color, UA yellow     Clarity, UA clear     Glucose, UA neg     Bilirubin, UA neg     Ketones, UA neg     Spec Grav, UA 1.020     Blood, UA neg     pH, UA 5.0     Protein, UA neg     Urobilinogen, UA 0.2     Nitrite, UA neg     Leukocytes, UA Negative    POCT CBC      Result Value Ref Range   WBC 6.7  4.6 - 10.2 K/uL   Lymph, poc 3.2  0.6 - 3.4   POC LYMPH PERCENT 47.8  10 - 50 %L   MID (cbc) 0.5  0 - 0.9   POC MID % 7.6  0 - 12 %M   POC Granulocyte 3.0  2 - 6.9   Granulocyte percent 44.6  37 - 80 %G   RBC 4.62  4.04 - 5.48 M/uL   Hemoglobin 13.8  12.2 - 16.2 g/dL   HCT, POC 44.1  37.7 - 47.9 %   MCV 95.4  80 - 97 fL   MCH, POC 29.8  27 - 31.2 pg   MCHC 31.2 (*) 31.8 - 35.4 g/dL   RDW, POC 14.9     Platelet Count, POC 212  142 - 424 K/uL   MPV 7.3  0 - 99.8 fL     EKG/XRAY:   Primary read interpreted by Dr. Marin Comment at  [facility].   ASSESSMENT/PLAN: Encounter Diagnoses  Name Primary?  . Dizziness Yes  . Weakness   . Swelling of foot joint, left   . Orthostatic hypotension    PLeasant deaf 78 yo female with PMH of HTN and urinary incontinence, prior blood clot, anemia UA is normal, CBC is normal, she has significant orthostatic HTN Advise to stop sample of urinary incontinence meds and see how she does Get stat doppler to rule out DVT Will f/u with labs   Gross sideeffects, risk and benefits, and alternatives of medications d/w patient. Patient is aware that all medications have potential sideeffects and we are unable to predict every sideeffect or drug-drug interaction that may occur.  Mariaha Ellington, [place], DO 06/10/2014 9:27 PM

## 2014-06-10 NOTE — Patient Instructions (Addendum)
Go to [facility] (directions below). Go into the ER to register for your ultrasound. Then go straight to radiology. DO NOT!! See an ER doctor   [place], [place] [address]   Head south on [address] toward [address]  0.7 mi   Turn left onto [address]  0.4 mi   Turn right onto [address]  3.4 mi   Turn right onto [address]  2.3 mi   Turn left onto [address]  0.6 mi   Turn right onto [address]  0.2 mi   [employer]  [address], [place], [place] [address]    Orthostatic Hypotension Orthostatic hypotension is a sudden drop in blood pressure. It happens when you quickly stand up from a seated or lying position. You may feel dizzy or light-headed. This can last for just a few seconds or for up to a few minutes. It is usually not a serious problem. However, if this happens frequently or gets worse, it can be a sign of something more serious. CAUSES  Different things can cause orthostatic hypotension, including:   Loss of body fluids (dehydration).  Medicines that lower blood pressure.  Sudden changes in posture, such as standing up quickly after you have been sitting or lying down.  Taking too much of your medicine. SIGNS AND SYMPTOMS   Light-headedness or dizziness.   Fainting or near-fainting.   A fast heart rate.   Weakness.   Feeling tired (fatigue).  DIAGNOSIS  Your health care provider may do several things to help diagnose your condition and identify the cause. These may include:   Taking a medical history and doing a physical exam.  Checking your blood pressure. Your health care provider will check your blood pressure when you are:  Lying down.  Sitting.  Standing.  Using tilt table testing. In this test, you lie down on a table that moves from a lying position to a standing position. You will be strapped onto the table. This test monitors your blood pressure and heart rate when you  are in different positions. TREATMENT  Treatment will vary depending on the cause. Possible treatments include:   Changing the dosage of your medicines.  Wearing compression stockings on your lower legs.  Standing up slowly after sitting or lying down.  Eating more salt.  Eating frequent, small meals.  In some cases, getting IV fluids.  Taking medicine to enhance fluid retention. HOME CARE INSTRUCTIONS  Only take over-the-counter or prescription medicines as directed by your health care provider.  Follow your health care provider's instructions for changing the dosage of your current medicines.  Do not stop or adjust your medicine on your own.  Stand up slowly after sitting or lying down. This allows your body to adjust to the different position.  Wear compression stockings as directed.  Eat extra salt as directed.  Do not add extra salt to your diet unless directed to by your health care provider.  Eat frequent, small meals.  Avoid standing suddenly after eating.  Avoid hot showers or excessive heat as directed by your health care provider.  Keep all follow-up appointments. SEEK MEDICAL CARE IF:  You continue to feel dizzy or light-headed after standing.  You feel groggy or confused.  You feel cold, clammy, or sick to your stomach (nauseous).  You have blurred vision.  You feel short of breath. SEEK IMMEDIATE MEDICAL CARE IF:  You faint after standing.  You have chest pain.  You have difficulty breathing.   You lose feeling or movement in your arms or legs.   You have slurred speech or difficulty talking, or you are unable to talk.  MAKE SURE YOU:   Understand these instructions.  Will watch your condition.  Will get help right away if you are not doing well or get worse. Document Released: 07/21/2002 Document Revised: 08/05/2013 Document Reviewed: 05/23/2013 [facility] Patient Information 2015 [place], [place]. This information is not  intended to replace advice given to you by your health care provider. Make sure you discuss any questions you have with your health care provider.

## 2014-06-11 LAB — COMPREHENSIVE METABOLIC PANEL WITH GFR
ALT: 17 U/L (ref 0–35)
Sodium: 140 meq/L (ref 135–145)
Total Protein: 7.6 g/dL (ref 6.0–8.3)

## 2014-06-11 LAB — COMPREHENSIVE METABOLIC PANEL
AST: 30 U/L (ref 0–37)
Albumin: 4.4 g/dL (ref 3.5–5.2)
Alkaline Phosphatase: 69 U/L (ref 39–117)
BUN: 16 mg/dL (ref 6–23)
CO2: 30 mEq/L (ref 19–32)
Calcium: 9.5 mg/dL (ref 8.4–10.5)
Chloride: 103 mEq/L (ref 96–112)
Creat: 0.86 mg/dL (ref 0.50–1.10)
Glucose, Bld: 98 mg/dL (ref 70–99)
Potassium: 4.1 mEq/L (ref 3.5–5.3)
Total Bilirubin: 0.4 mg/dL (ref 0.2–1.2)

## 2014-06-15 ENCOUNTER — Telehealth: Payer: Self-pay | Admitting: Family Medicine

## 2014-06-15 NOTE — Telephone Encounter (Signed)
LM for daughter to call me back, labs are normal,called to see if she is still dizzy

## 2014-06-18 ENCOUNTER — Other Ambulatory Visit: Payer: Self-pay | Admitting: Internal Medicine

## 2014-06-18 MED ORDER — ZOLPIDEM 10 MG TABLET
1.0000 | ORAL_TABLET | Freq: Every day | ORAL | Status: AC
Start: 2014-06-18 — End: 2015-06-21

## 2014-06-18 NOTE — Telephone Encounter (Signed)
Patient requesting Ambien. Last refill date 11/21/13.  Please send electronically to pharm above. Luann Aspinwall [facility] II

## 2014-06-18 NOTE — Telephone Encounter (Signed)
This is the last refill I will give her for any meds.  She has not been seen here in over 2 yrs.  [place] find [place]

## 2014-06-19 NOTE — Telephone Encounter (Signed)
Chuck notified of MD's message & verbalized understanding. Taheerah Guldin [facility] II

## 2014-07-07 ENCOUNTER — Other Ambulatory Visit: Payer: Self-pay | Admitting: Internal Medicine

## 2014-07-22 ENCOUNTER — Other Ambulatory Visit: Payer: Self-pay | Admitting: Family Medicine

## 2014-07-25 ENCOUNTER — Other Ambulatory Visit: Payer: Self-pay | Admitting: Family Medicine

## 2014-08-02 ENCOUNTER — Other Ambulatory Visit: Payer: Self-pay | Admitting: Family Medicine

## 2014-08-03 ENCOUNTER — Ambulatory Visit (INDEPENDENT_AMBULATORY_CARE_PROVIDER_SITE_OTHER): Payer: Medicare Other | Admitting: Family Medicine

## 2014-08-03 ENCOUNTER — Ambulatory Visit (INDEPENDENT_AMBULATORY_CARE_PROVIDER_SITE_OTHER): Payer: Medicare Other

## 2014-08-03 VITALS — BP 132/82 | HR 54 | Temp 97.9°F | Resp 18 | Ht 65.0 in | Wt 208.6 lb

## 2014-08-03 DIAGNOSIS — M1711 Unilateral primary osteoarthritis, right knee: Secondary | ICD-10-CM | POA: Diagnosis not present

## 2014-08-03 DIAGNOSIS — G479 Sleep disorder, unspecified: Secondary | ICD-10-CM

## 2014-08-03 DIAGNOSIS — E785 Hyperlipidemia, unspecified: Secondary | ICD-10-CM | POA: Diagnosis not present

## 2014-08-03 DIAGNOSIS — M25561 Pain in right knee: Secondary | ICD-10-CM

## 2014-08-03 DIAGNOSIS — M810 Age-related osteoporosis without current pathological fracture: Secondary | ICD-10-CM | POA: Diagnosis not present

## 2014-08-03 MED ORDER — ZOLPIDEM TARTRATE 10 MG PO TABS: ORAL_TABLET | ORAL | Status: DC

## 2014-08-03 MED ORDER — METHYLPREDNISOLONE ACETATE 80 MG/ML IJ SUSP
40.0000 mg | Freq: Once | INTRAMUSCULAR | Status: AC
  Administered 2014-08-03: 40 mg via INTRA_ARTICULAR

## 2014-08-03 MED ORDER — METHYLPREDNISOLONE ACETATE 80 MG/ML IJ SUSP: 80.0000 mg | Freq: Once | INTRAMUSCULAR | Status: DC

## 2014-08-03 MED ORDER — SIMVASTATIN 20 MG PO TABS: 20.0000 mg | ORAL_TABLET | Freq: Every evening | ORAL | Status: DC

## 2014-08-03 NOTE — Patient Instructions (Signed)
I am refilling the Ambien because you have been on it a long time. However I would recommend at your age that you decrease the dose to only 5 mg (one half pill) when possible.  Take Tylenol 2 tablets twice daily as needed for stiffness and pain in knee. Hopefully the injection will help considerably.  Apply ice to knee tonight for 10 or 15 minutes a couple of times, and again several times tomorrow. Sometimes it hurts a little more for the first 24 hours, then gets feeling better. The cortisone medication had combined with it a little bit of numbing medicine, but that will wear off and it will hurt some tonight just from the chemical effect of the medicine.  Continue your cholesterol medication. I will let you know the results of your laboratory data.  Advise returning about every 6 months.  Return sooner if problems arise.

## 2014-08-03 NOTE — Progress Notes (Signed)
Subjective: Patient is complaining of her right knee hurting her. She says it is stiffness primarily that bothers her. She is very tender to touch in it however. She also needs of her medicines refilled  She is doing fairly well otherwise  Objective: She is deaf, but is accompanied by a granddaughter who interprets with sign language but she also communicated throughout the conversation by writing notes. It mated a long visit. She needs simvastatin and Ambien refilled. Her doctor in [place] said she she would have to have them refilled since she has been staying here in [place] for 2 years. We had a long note conversation about osteoporosis and osteopenia and calcium. She says that she can't swallow calcium that is too big. We discussed using chewable calciums.  Her throat is clear. Neck supple without nodes. Chest clear to auscultation. Heart regular without murmurs. Right knee is very tender. It is not swollen. There is minimal crepitance.  [facility] reading (PRIMARY) by  Dr. Linna Darner Mild narrowing of joint space. Bones appear osteoporotic.  Assessment: Mild DJD of knees Osteoporosis Hyperlipidemia Insomnia  Plan: Refill her medications Take Tylenol for the knee Offered her an injection of her knee and she was willing to undergo that. Wrote notes back and forth explaining it.  She needs a bone density scan.

## 2014-08-04 LAB — COMPREHENSIVE METABOLIC PANEL
ALT: 12 U/L (ref 0–35)
AST: 23 U/L (ref 0–37)
Albumin: 4.1 g/dL (ref 3.5–5.2)
Alkaline Phosphatase: 63 U/L (ref 39–117)
BUN: 19 mg/dL (ref 6–23)
CALCIUM: 9.4 mg/dL (ref 8.4–10.5)
CO2: 27 meq/L (ref 19–32)
Chloride: 104 mEq/L (ref 96–112)
Creat: 0.87 mg/dL (ref 0.50–1.10)
Glucose, Bld: 73 mg/dL (ref 70–99)
Potassium: 4.2 mEq/L (ref 3.5–5.3)
SODIUM: 139 meq/L (ref 135–145)
TOTAL PROTEIN: 7 g/dL (ref 6.0–8.3)
Total Bilirubin: 0.4 mg/dL (ref 0.2–1.2)

## 2014-08-04 LAB — LIPID PANEL
CHOLESTEROL: 212 mg/dL — AB (ref 0–200)
HDL: 41 mg/dL (ref >39)
LDL Cholesterol: 110 mg/dL — ABNORMAL HIGH (ref 0–99)
Total CHOL/HDL Ratio: 5.2 Ratio
Triglycerides: 307 mg/dL — ABNORMAL HIGH (ref <150)
VLDL: 61 mg/dL — ABNORMAL HIGH (ref 0–40)

## 2014-08-13 ENCOUNTER — Other Ambulatory Visit: Payer: Self-pay | Admitting: Radiology

## 2014-08-13 MED ORDER — SIMVASTATIN 40 MG PO TABS: 40.0000 mg | ORAL_TABLET | Freq: Every day | ORAL | Status: DC

## 2014-08-17 ENCOUNTER — Ambulatory Visit (INDEPENDENT_AMBULATORY_CARE_PROVIDER_SITE_OTHER): Payer: Medicare Other | Admitting: Ophthalmology

## 2014-08-19 ENCOUNTER — Telehealth: Payer: Self-pay | Admitting: Radiology

## 2014-08-19 NOTE — Telephone Encounter (Signed)
OK to schedule CPE with me; I am her PCP.  Pt is deaf.

## 2014-08-19 NOTE — Telephone Encounter (Signed)
Patient walked in yesterday to schedule an appointment with you for a physical, states you are PCP, please advise.

## 2014-08-21 NOTE — Telephone Encounter (Signed)
LMOM for daughter Dola Argyle to [facility]

## 2014-08-24 DIAGNOSIS — N3946 Mixed incontinence: Secondary | ICD-10-CM | POA: Diagnosis not present

## 2014-08-24 DIAGNOSIS — N3944 Nocturnal enuresis: Secondary | ICD-10-CM | POA: Diagnosis not present

## 2014-08-29 ENCOUNTER — Other Ambulatory Visit: Payer: Self-pay | Admitting: Physician Assistant

## 2014-09-07 ENCOUNTER — Telehealth: Payer: Self-pay

## 2014-09-07 NOTE — Telephone Encounter (Signed)
Expecting a referral for a bone scan, via a letter from Dr. Venia Minks harker Son   [phone]

## 2014-09-07 NOTE — Telephone Encounter (Signed)
Refill for deaf patient.    zolpidem (AMBIEN) 10 MG tablet    Please call son.  [phone]

## 2014-09-08 ENCOUNTER — Other Ambulatory Visit: Payer: Self-pay | Admitting: Family Medicine

## 2014-09-08 NOTE — Telephone Encounter (Signed)
Call son --- Dr. Linna Darner provided patient with refill of Ambien at 08/03/14 visit.   Also, referrals is contacting [facility] about bone density appointment.

## 2014-09-08 NOTE — Telephone Encounter (Signed)
LM for [facility] with this information.

## 2014-09-08 NOTE — Telephone Encounter (Signed)
Please advise on refill request

## 2014-09-09 NOTE — Telephone Encounter (Signed)
08/03/2014 she got 90 pills prescribed, which should last a minimum of 11/02/2014.  I told her to try to decrease to 1/2 nightly, which wold have lasted until June.  No Refills.  Come in if she wishes to discuss this.

## 2014-09-10 ENCOUNTER — Other Ambulatory Visit: Payer: Self-pay | Admitting: Family Medicine

## 2014-09-11 NOTE — Telephone Encounter (Signed)
Son called back and pt states that Dr Linna Darner said he would call in her Rxs when she saw him. I checked w/Dr Linna Darner who stated that he would have given the printed Rx to the pt as he always does if they are here. He agreed to give pt 90 days worth if pharmacy doesn't have it. Called pharm and they do not have this on file. Verbally called in Rx for #90, NR, or #30 w/2RFs if ins prefers. LMOM w/this info for pt's son, and gave inst's to have pt try to cut down to 1/2 tab Qhs per Dr Linna Darner and to bring pt back before Rxs run out.

## 2014-10-11 ENCOUNTER — Other Ambulatory Visit: Payer: Self-pay | Admitting: Family Medicine

## 2014-10-11 ENCOUNTER — Other Ambulatory Visit: Payer: Self-pay | Admitting: Internal Medicine

## 2014-10-29 ENCOUNTER — Other Ambulatory Visit: Payer: Self-pay | Admitting: Family Medicine

## 2014-10-29 ENCOUNTER — Telehealth: Payer: Self-pay | Admitting: Family Medicine

## 2014-10-29 NOTE — Telephone Encounter (Signed)
simvastatin (ZOCOR) 40 MG tablet [[id number]]     Order Details    Dose: 40 mg Route: Oral Frequency: Daily at bedtime   Dispense Quantity:  90 tablet Refills:  1 Fills Remaining:  1          Sig: Take 1 tablet (40 mg total) by mouth at bedtime. Take 1 tablet after dinner.         Written Date:  08/13/14 Expiration Date:  08/13/15     Start Date:  08/13/14 End Date:  --     Ordering Provider:  Posey Boyer, MD Authorizing Provider:  Posey Boyer, MD Ordering User:  Walden Field               Dr. Linna Darner you already increased her medication to 40 mg? Do you want to increase this again?

## 2014-10-29 NOTE — Telephone Encounter (Signed)
Sorry for confusion.  Continue 40 mg daily.  Return in 2 months for recheck.  What was the referral she was waiting on?

## 2014-10-29 NOTE — Telephone Encounter (Signed)
Patient called to request a refill on Simvastatin and to increase the dosage on that. Patient also wants to know the status of her referral.  [phone]

## 2014-10-30 NOTE — Telephone Encounter (Signed)
Left VM informing pt to continue 40mg  simvastatin and to return in 2 months for re-check.  Asked to have pt call back regarding what referral she is waiting on.

## 2014-11-02 NOTE — Telephone Encounter (Signed)
Left detailed msg on machine, also asked for pt to cb with details for referral requested.

## 2014-11-07 ENCOUNTER — Other Ambulatory Visit: Payer: Self-pay | Admitting: Family Medicine

## 2014-11-07 NOTE — Telephone Encounter (Signed)
LMOM to call back to see if she needs a RF. She has an appt on Thurs with Dr. Tamala Julian

## 2014-11-10 ENCOUNTER — Telehealth: Payer: Self-pay

## 2014-11-10 NOTE — Telephone Encounter (Signed)
Dr. Smith

## 2014-11-10 NOTE — Telephone Encounter (Signed)
Patient requesting an order to have a bone Density test done and requesting Fosamax. Message was left on referrals answering machine by a relay service since patient is deaf. When calling patient back please contact her daughter Dola Argyle at [phone]

## 2014-11-10 NOTE — Telephone Encounter (Signed)
Bone density scan was ordered by Dr. Linna Darner on 08/03/14.  Referrals, please help schedule this study.  I don't see Fosamax on patient's medication list; when has patient last taken Fosamax?

## 2014-11-11 ENCOUNTER — Encounter: Payer: Self-pay | Admitting: Family Medicine

## 2014-11-11 ENCOUNTER — Ambulatory Visit (INDEPENDENT_AMBULATORY_CARE_PROVIDER_SITE_OTHER): Payer: Medicare Other | Admitting: Family Medicine

## 2014-11-11 VITALS — BP 178/100 | HR 71 | Temp 98.1°F | Resp 18 | Ht 65.5 in | Wt 205.2 lb

## 2014-11-11 DIAGNOSIS — R609 Edema, unspecified: Secondary | ICD-10-CM

## 2014-11-11 DIAGNOSIS — E7800 Pure hypercholesterolemia, unspecified: Secondary | ICD-10-CM

## 2014-11-11 DIAGNOSIS — R5383 Other fatigue: Secondary | ICD-10-CM | POA: Diagnosis not present

## 2014-11-11 DIAGNOSIS — M7989 Other specified soft tissue disorders: Secondary | ICD-10-CM | POA: Diagnosis not present

## 2014-11-11 DIAGNOSIS — N3941 Urge incontinence: Secondary | ICD-10-CM

## 2014-11-11 DIAGNOSIS — M8580 Other specified disorders of bone density and structure, unspecified site: Secondary | ICD-10-CM | POA: Diagnosis not present

## 2014-11-11 DIAGNOSIS — I10 Essential (primary) hypertension: Secondary | ICD-10-CM | POA: Diagnosis not present

## 2014-11-11 DIAGNOSIS — H9193 Unspecified hearing loss, bilateral: Secondary | ICD-10-CM

## 2014-11-11 LAB — COMPREHENSIVE METABOLIC PANEL
ALK PHOS: 69 U/L (ref 39–117)
ALT: 17 U/L (ref 0–35)
AST: 24 U/L (ref 0–37)
Albumin: 4.2 g/dL (ref 3.5–5.2)
BILIRUBIN TOTAL: 0.5 mg/dL (ref 0.2–1.2)
BUN: 15 mg/dL (ref 6–23)
CO2: 29 meq/L (ref 19–32)
CREATININE: 0.68 mg/dL (ref 0.50–1.10)
Calcium: 9.9 mg/dL (ref 8.4–10.5)
Chloride: 102 mEq/L (ref 96–112)
Glucose, Bld: 61 mg/dL — ABNORMAL LOW (ref 70–99)
POTASSIUM: 4.4 meq/L (ref 3.5–5.3)
SODIUM: 141 meq/L (ref 135–145)
Total Protein: 7.7 g/dL (ref 6.0–8.3)

## 2014-11-11 LAB — CBC WITH DIFFERENTIAL/PLATELET
BASOS PCT: 0 % (ref 0–1)
Basophils Absolute: 0 10*3/uL (ref 0.0–0.1)
EOS ABS: 0.1 10*3/uL (ref 0.0–0.7)
Eosinophils Relative: 2 % (ref 0–5)
HEMATOCRIT: 41 % (ref 36.0–46.0)
HEMOGLOBIN: 13.8 g/dL (ref 12.0–15.0)
LYMPHS PCT: 40 % (ref 12–46)
Lymphs Abs: 2.2 10*3/uL (ref 0.7–4.0)
MCH: 30.5 pg (ref 26.0–34.0)
MCHC: 33.7 g/dL (ref 30.0–36.0)
MCV: 90.5 fL (ref 78.0–100.0)
MONOS PCT: 9 % (ref 3–12)
MPV: 10.8 fL (ref 8.6–12.4)
Monocytes Absolute: 0.5 10*3/uL (ref 0.1–1.0)
NEUTROS ABS: 2.7 10*3/uL (ref 1.7–7.7)
Neutrophils Relative %: 49 % (ref 43–77)
Platelets: 196 10*3/uL (ref 150–400)
RBC: 4.53 MIL/uL (ref 3.87–5.11)
RDW: 13.7 % (ref 11.5–15.5)
WBC: 5.6 10*3/uL (ref 4.0–10.5)

## 2014-11-11 LAB — LIPID PANEL
CHOLESTEROL: 163 mg/dL (ref 0–200)
HDL: 45 mg/dL — ABNORMAL LOW (ref >=46)
LDL Cholesterol: 85 mg/dL (ref 0–99)
Total CHOL/HDL Ratio: 3.6 Ratio
Triglycerides: 166 mg/dL — ABNORMAL HIGH (ref <150)
VLDL: 33 mg/dL (ref 0–40)

## 2014-11-11 MED ORDER — TOLTERODINE TARTRATE 2 MG PO TABS: 2.0000 mg | ORAL_TABLET | Freq: Two times a day (BID) | ORAL | Status: DC

## 2014-11-11 MED ORDER — FUROSEMIDE 20 MG PO TABS: ORAL_TABLET | ORAL | Status: DC

## 2014-11-11 NOTE — Telephone Encounter (Signed)
Lm with daughter Dola Argyle to CB.

## 2014-11-11 NOTE — Progress Notes (Signed)
Subjective:    Patient ID: Melissa Murphy, female    DOB: 07/21/1936, 79 y.o.   MRN: [id number]  11/11/2014  Foot Swelling and Medication Refill   HPI This 79 y.o. female presents for eight month follow-up:  1. Leg swelling: started two weeks ago.  +fatigued all the time as well.  Not sure if there is a correlation.  For past year, feet have been fine until recently.  No change in salt intake.  No excessive salt intake.  No chest pain, palpitations, SOB, orthopnea.  No recent travel.  No change in medications.  Taking Lasix 20mg  daily.  No calf pain.  No compression stockings.  History of vascular surgery consultation; previous rx for compression stockings; went one year ago.  [facility].  Got a video phone two weeks ago; has been communicating with friends on video phone a lot lately; has been sitting a lot more than normal.  Onset of swelling around this time.  2. Anxiety: feeling a bit anxious.  Onset two weeks ago.    3.  HTN: Taking Ramipril 10mg  daily, Metoprolol bid 50mg , Clonidine 0.1mg  bid.    4. Hyperlipidemia: taking Simvastatin daily; increased to 40mg  daily.  Patient reports good compliance with medication, good tolerance to medication, and good symptom control.    5. Osteopenia: on xray; referral placed for bone density scan.  Has a video phone.   Eats yogurt tid.  Drinks milk daily.   6 Constipation:  Colace ineffective; plans to try Metamucil.  Tried Miralax but it caused diarrhea.  7. OA knee: taking Meloxicam PRN.  Not too many pains now.  Not needing Meloxicam very much at this time.  8. Urinary incontinence:  Prescribed by urology for leakage; so expensive; stopped medication; needed generic medication; Detrol 2mg  bid.    Improvement; small amount of leakage.     Review of Systems  Constitutional: Negative for fever, chills, diaphoresis and fatigue.  Eyes: Negative for visual disturbance.  Respiratory: Negative for cough and shortness of breath.     Cardiovascular: Positive for leg swelling. Negative for chest pain and palpitations.  Gastrointestinal: Positive for constipation. Negative for nausea, vomiting, abdominal pain and diarrhea.  Endocrine: Negative for cold intolerance, heat intolerance, polydipsia, polyphagia and polyuria.  Genitourinary: Negative for dysuria, urgency, hematuria, flank pain and difficulty urinating.  Skin: Negative for rash.  Neurological: Negative for dizziness, tremors, seizures, syncope, facial asymmetry, speech difficulty, weakness, light-headedness, numbness and headaches.  Psychiatric/Behavioral: The patient is nervous/anxious.     Past Medical History  Diagnosis Date  . GERD (gastroesophageal reflux disease)   . Hypertension   . Hyperlipidemia   . Glaucoma   . Urinary incontinence   . Edema   . Anemia   . Arthritis   . DVT (deep venous thrombosis)     in eye, and leg  . Osteoporosis   . Ulcer   . Hernia   . Deaf     Congenital; Requires an interpreter  . Uterine polyp   . Hiatal hernia   . Sleep apnea   . Clotting disorder   . Thyroid disease   . Cataract    Past Surgical History  Procedure Laterality Date  . Cataract extraction      both eyes  . Goiter removed      age 20  . Xanthoma tumor removed    . Refractive surgery    . Dilatation & currettage/hysteroscopy with resectocope N/A 10/11/2012    Procedure: DILATATION & CURETTAGE/HYSTEROSCOPY WITH  RESECTOCOPE;  Surgeon: Allena Katz, MD;  Location: [facility];  Service: Gynecology;  Laterality: N/A;  pt is deaf; please contact daughter Heide Spark [phone]  . Eye surgery    . Hernia repair    . Tonsillectomy    . Hiatal hernia repair N/A 12/08/2013    Procedure: LAPAROSCOPIC REPAIR OF HIATAL HERNIA  ;  Surgeon: [name], MD;  Location: [facility];  Service: General;  Laterality: N/A;   Allergies  Allergen Reactions  . Avastin [Bevacizumab] Nausea And Vomiting  . Clindamycin/Lincomycin Itching  . Novocain [Procaine]  Other (See Comments)    seizures  . Sulfa Antibiotics Other (See Comments)    fever   Current Outpatient Prescriptions  Medication Sig Dispense Refill  . aspirin 81 MG tablet Take 81 mg by mouth every morning.     . cloNIDine (CATAPRES) 0.1 MG tablet TAKE 1 TABLET BY MOUTH TWICE A DAY 60 tablet 2  . dorzolamide-timolol (COSOPT) 22.3-6.8 MG/ML ophthalmic solution Place 1 drop into the right eye 2 (two) times daily.    . furosemide (LASIX) 20 MG tablet TAKE 1-2 TABLET (20 MG TOTAL) BY MOUTH EVERY MORNING. 60 tablet 5  . meloxicam (MOBIC) 15 MG tablet TAKE 1 TABLET (15 MG TOTAL) BY MOUTH DAILY. 30 tablet 2  . metoprolol (LOPRESSOR) 50 MG tablet Take 1 tablet (50 mg total) by mouth 2 (two) times daily. PATIENT NEEDS OFFICE VISIT FOR ADDITIONAL REFILLS 60 tablet 0  . omeprazole (PRILOSEC) 20 MG capsule Take 20 mg by mouth 2 (two) times daily before a meal.    . ramipril (ALTACE) 10 MG capsule Take 10 mg by mouth every morning.    . simvastatin (ZOCOR) 40 MG tablet Take 1 tablet (40 mg total) by mouth at bedtime. Take 1 tablet after dinner. 90 tablet 1  . zolpidem (AMBIEN) 10 MG tablet Take one half to one at bedtime as needed for sleep. 90 tablet 1  . cloNIDine (CATAPRES) 0.1 MG tablet Take 1 tablet (0.1 mg total) by mouth 2 (two) times daily. 60 tablet 3  . docusate sodium (COLACE) 100 MG capsule Take 100 mg by mouth daily as needed for mild constipation.    . ramipril (ALTACE) 10 MG capsule TAKE ONE CAPSULE BY MOUTH EVERY DAY 30 capsule 0  . simvastatin (ZOCOR) 20 MG tablet Take 1 tablet (20 mg total) by mouth every evening. (Patient not taking: Reported on 11/11/2014) 90 tablet 3  . tolterodine (DETROL) 2 MG tablet Take 1 tablet (2 mg total) by mouth 2 (two) times daily. 60 tablet 11  . TOVIAZ 8 MG TB24 tablet Take 8 mg by mouth daily.  11   No current facility-administered medications for this visit.       Objective:    BP 178/100 mmHg  Pulse 71  Temp(Src) 98.1 F (36.7 C) (Oral)   Resp 18  Ht 5' 5.5" (1.664 m)  Wt 205 lb 3.2 oz (93.078 kg)  BMI 33.62 kg/m2  SpO2 95% Physical Exam  Constitutional: She is oriented to person, place, and time. She appears well-developed and well-nourished. No distress.  HENT:  Head: Normocephalic and atraumatic.  Right Ear: External ear normal.  Left Ear: External ear normal.  Nose: Nose normal.  Mouth/Throat: Oropharynx is clear and moist.  Eyes: Conjunctivae and EOM are normal. Pupils are equal, round, and reactive to light.  Neck: Normal range of motion. Neck supple. Carotid bruit is not present. No thyromegaly present.  Cardiovascular: Normal rate, regular  rhythm, normal heart sounds and intact distal pulses.  Exam reveals no gallop and no friction rub.   No murmur heard. 2-3+ pitting edema to B distal calves.  Hommen's negative.  No calf tenderness.  Pulmonary/Chest: Effort normal and breath sounds normal. She has no wheezes. She has no rales.  Abdominal: Soft. Bowel sounds are normal. She exhibits no distension and no mass. There is no tenderness. There is no rebound and no guarding.  Musculoskeletal: She exhibits edema.  Lymphadenopathy:    She has no cervical adenopathy.  Neurological: She is alert and oriented to person, place, and time. No cranial nerve deficit.  Skin: Skin is warm and dry. No rash noted. She is not diaphoretic. No erythema. No pallor.  Psychiatric: She has a normal mood and affect. Her behavior is normal.   Results for orders placed or performed in visit on 08/03/14  Comprehensive metabolic panel  Result Value Ref Range   Sodium 139 135 - 145 mEq/L   Potassium 4.2 3.5 - 5.3 mEq/L   Chloride 104 96 - 112 mEq/L   CO2 27 19 - 32 mEq/L   Glucose, Bld 73 70 - 99 mg/dL   BUN 19 6 - 23 mg/dL   Creat 0.87 0.50 - 1.10 mg/dL   Total Bilirubin 0.4 0.2 - 1.2 mg/dL   Alkaline Phosphatase 63 39 - 117 U/L   AST 23 0 - 37 U/L   ALT 12 0 - 35 U/L   Total Protein 7.0 6.0 - 8.3 g/dL   Albumin 4.1 3.5 - 5.2 g/dL     Calcium 9.4 8.4 - 10.5 mg/dL  Lipid panel  Result Value Ref Range   Cholesterol 212 (H) 0 - 200 mg/dL   Triglycerides 307 (H) <150 mg/dL   HDL 41 >39 mg/dL   Total CHOL/HDL Ratio 5.2 Ratio   VLDL 61 (H) 0 - 40 mg/dL   LDL Cholesterol 110 (H) 0 - 99 mg/dL   EKG: sinus brady at 58; no ST changes.     Assessment & Plan:   1. Leg swelling   2. Other fatigue   3. Essential hypertension, benign   4. Pure hypercholesterolemia   5. Urge incontinence of urine   6. Essential hypertension   7. Edema   8. Deafness, bilateral   9. Osteopenia     1. Leg swelling/edema/venous stasis: worsening in past two weeks with change in activity level.  Obtain u/a, labs.  Increase Lasix to 20mg  1-2 tablet daily.  Wear compression stockings.  Elevate legs as much as possible.  No symptoms of CHF to suggest cardiac pathology; EKG WNL. 2.  Malaise and fatigue: New. Associated with leg swelling.  Stable EKG; obtain labs. 3.  HTN: elevated today; recommend checking BP weekly for next four weeks; to call with BP readings.  Follow-up in three months; if remains elevated, will need to adjust medications. 4.  Hypercholesterolemia: stable; obtain labs; continue current medications. 5.  Urge incontinence: stable; s/p urology evaluation; refill provided. 6.  Osteopenia: new; detected by xray; refer for bone density scan; continue 3 servings of dairy daily. 7. Congenital deafness; interpreter present during entire visit.   Meds ordered this encounter  Medications  . omeprazole (PRILOSEC) 20 MG capsule    Sig: Take 20 mg by mouth 2 (two) times daily before a meal.  . furosemide (LASIX) 20 MG tablet    Sig: TAKE 1-2 TABLET (20 MG TOTAL) BY MOUTH EVERY MORNING.    Dispense:  60 tablet  Refill:  5  . tolterodine (DETROL) 2 MG tablet    Sig: Take 1 tablet (2 mg total) by mouth 2 (two) times daily.    Dispense:  60 tablet    Refill:  11    Return in about 3 months (around 02/11/2015) for complete physical  examiniation.     Michele Judy Elayne Guerin, M.D. [facility] [address] [place], [place]  [address] [phone] phone [phone] fax

## 2014-11-11 NOTE — Addendum Note (Signed)
Addended by: Wardell Honour on: 11/11/2014 05:48 PM   Modules accepted: Orders, Medications

## 2014-11-11 NOTE — Patient Instructions (Signed)
1. Recommend wearing compression stockings daily.  Remove them at nighttime. 2.  Elevate feet as much as possible. 3. Avoid salt in diet.  Edema Edema is an abnormal buildup of fluids in your bodytissues. Edema is somewhatdependent on gravity to pull the fluid to the lowest place in your body. That makes the condition more common in the legs and thighs (lower extremities). Painless swelling of the feet and ankles is common and becomes more likely as you get older. It is also common in looser tissues, like around your eyes.  When the affected area is squeezed, the fluid may move out of that spot and leave a dent for a few moments. This dent is called pitting.  CAUSES  There are many possible causes of edema. Eating too much salt and being on your feet or sitting for a long time can cause edema in your legs and ankles. Hot weather may make edema worse. Common medical causes of edema include:  Heart failure.  Liver disease.  Kidney disease.  Weak blood vessels in your legs.  Cancer.  An injury.  Pregnancy.  Some medications.  Obesity. SYMPTOMS  Edema is usually painless.Your skin may look swollen or shiny.  DIAGNOSIS  Your health care provider may be able to diagnose edema by asking about your medical history and doing a physical exam. You may need to have tests such as X-rays, an electrocardiogram, or blood tests to check for medical conditions that may cause edema.  TREATMENT  Edema treatment depends on the cause. If you have heart, liver, or kidney disease, you need the treatment appropriate for these conditions. General treatment may include:  Elevation of the affected body part above the level of your heart.  Compression of the affected body part. Pressure from elastic bandages or support stockings squeezes the tissues and forces fluid back into the blood vessels. This keeps fluid from entering the tissues.  Restriction of fluid and salt intake.  Use of a water pill  (diuretic). These medications are appropriate only for some types of edema. They pull fluid out of your body and make you urinate more often. This gets rid of fluid and reduces swelling, but diuretics can have side effects. Only use diuretics as directed by your health care provider. HOME CARE INSTRUCTIONS   Keep the affected body part above the level of your heart when you are lying down.   Do not sit still or stand for prolonged periods.   Do not put anything directly under your knees when lying down.  Do not wear constricting clothing or garters on your upper legs.   Exercise your legs to work the fluid back into your blood vessels. This may help the swelling go down.   Wear elastic bandages or support stockings to reduce ankle swelling as directed by your health care provider.   Eat a low-salt diet to reduce fluid if your health care provider recommends it.   Only take medicines as directed by your health care provider. SEEK MEDICAL CARE IF:   Your edema is not responding to treatment.  You have heart, liver, or kidney disease and notice symptoms of edema.  You have edema in your legs that does not improve after elevating them.   You have sudden and unexplained weight gain. SEEK IMMEDIATE MEDICAL CARE IF:   You develop shortness of breath or chest pain.   You cannot breathe when you lie down.  You develop pain, redness, or warmth in the swollen areas.  You have heart, liver, or kidney disease and suddenly get edema.  You have a fever and your symptoms suddenly get worse. MAKE SURE YOU:   Understand these instructions.  Will watch your condition.  Will get help right away if you are not doing well or get worse. Document Released: 07/31/2005 Document Revised: 12/15/2013 Document Reviewed: 05/23/2013 [facility] Patient Information 2015 [place], [place]. This information is not intended to replace advice given to you by your health care provider. Make sure you  discuss any questions you have with your health care provider.

## 2014-11-12 NOTE — Telephone Encounter (Signed)
Lm on suzie's phone again.

## 2014-11-17 ENCOUNTER — Telehealth: Payer: Self-pay

## 2014-11-17 DIAGNOSIS — Z1231 Encounter for screening mammogram for malignant neoplasm of breast: Secondary | ICD-10-CM

## 2014-11-17 NOTE — Telephone Encounter (Signed)
Ok to refer pt for mammo?

## 2014-11-17 NOTE — Telephone Encounter (Signed)
Patient requesting a referral for her MMG. When calling patient please call her daughter Dola Argyle [phone].

## 2014-11-18 ENCOUNTER — Ambulatory Visit (INDEPENDENT_AMBULATORY_CARE_PROVIDER_SITE_OTHER): Payer: Medicare Other | Admitting: Family Medicine

## 2014-11-18 ENCOUNTER — Ambulatory Visit
Admission: RE | Admit: 2014-11-18 | Discharge: 2014-11-18 | Disposition: A | Discharge status: Home / Self Care | Payer: Medicare Other | Source: Ambulatory Visit | Attending: Family Medicine | Admitting: Family Medicine

## 2014-11-18 ENCOUNTER — Ambulatory Visit (INDEPENDENT_AMBULATORY_CARE_PROVIDER_SITE_OTHER): Payer: Medicare Other

## 2014-11-18 VITALS — BP 120/78 | HR 69 | Temp 98.2°F | Ht 63.0 in | Wt 206.2 lb

## 2014-11-18 DIAGNOSIS — R059 Cough, unspecified: Secondary | ICD-10-CM

## 2014-11-18 DIAGNOSIS — R05 Cough: Secondary | ICD-10-CM

## 2014-11-18 DIAGNOSIS — J029 Acute pharyngitis, unspecified: Secondary | ICD-10-CM

## 2014-11-18 DIAGNOSIS — M810 Age-related osteoporosis without current pathological fracture: Secondary | ICD-10-CM

## 2014-11-18 DIAGNOSIS — M1711 Unilateral primary osteoarthritis, right knee: Secondary | ICD-10-CM

## 2014-11-18 DIAGNOSIS — M8588 Other specified disorders of bone density and structure, other site: Secondary | ICD-10-CM | POA: Diagnosis not present

## 2014-11-18 DIAGNOSIS — M85852 Other specified disorders of bone density and structure, left thigh: Secondary | ICD-10-CM | POA: Diagnosis not present

## 2014-11-18 DIAGNOSIS — J988 Other specified respiratory disorders: Secondary | ICD-10-CM

## 2014-11-18 DIAGNOSIS — J22 Unspecified acute lower respiratory infection: Secondary | ICD-10-CM

## 2014-11-18 DIAGNOSIS — Z780 Asymptomatic menopausal state: Secondary | ICD-10-CM | POA: Diagnosis not present

## 2014-11-18 LAB — POCT CBC
Granulocyte percent: 71.8 %G (ref 37–80)
HCT, POC: 38.9 % (ref 37.7–47.9)
Hemoglobin: 12.5 g/dL (ref 12.2–16.2)
Lymph, poc: 2.2 (ref 0.6–3.4)
MCH, POC: 30.1 pg (ref 27–31.2)
MCHC: 32.3 g/dL (ref 31.8–35.4)
MCV: 93.4 fL (ref 80–97)
MID (cbc): 0.6 (ref 0–0.9)
MPV: 7.9 fL (ref 0–99.8)
POC Granulocyte: 7.3 — AB (ref 2–6.9)
POC LYMPH PERCENT: 22 %L (ref 10–50)
POC MID %: 6.2 % (ref 0–12)
Platelet Count, POC: 161 10*3/uL (ref 142–424)
RBC: 4.16 M/uL (ref 4.04–5.48)
RDW, POC: 16.9 %
WBC: 10.2 10*3/uL (ref 4.6–10.2)

## 2014-11-18 LAB — POCT RAPID STREP A (OFFICE): Rapid Strep A Screen: NEGATIVE

## 2014-11-18 MED ORDER — GUAIFENESIN ER 600 MG PO TB12: 600.0000 mg | ORAL_TABLET | Freq: Two times a day (BID) | ORAL | Status: DC | PRN

## 2014-11-18 MED ORDER — AZITHROMYCIN 250 MG PO TABS
ORAL_TABLET | ORAL | Status: DC
Start: 2014-11-18 — End: 2014-11-25

## 2014-11-18 NOTE — Progress Notes (Signed)
 Chief Complaint:  Chief Complaint  Patient presents with  . URI    C/O sore throat, coughing up green, & back pain x 1-2 days. Felt so weak she could barely walk. Today she is walking better. Ate soup today as it hurts to swallow. Mints help    HPI: Melissa Murphy is a 79 y.o. female who is here for 2 day history of sore throat, productive green sputum production with cough, generalized weakness.  He thinks it is bronchitis and is feeling better today except for sore throat, spitting up greenish sputum and she has pain in her upper back. Yesterday she had some stomach pain but it's gone today. She has not had any fevers, has had chills. Denies any muscle aches except for in her upper back. She is taking aspirin and mints. She denies chest pain or shortness of breath. It does hurt to swallow food. She was around a sick granddaughter who stayed for a couple of days. Nobody else in her family is sick. Her husband has allergies but he is better today.She has tried mints.  She has had her flu vaccine. She has had her pneumonia vaccine  SpO2 Readings from Last 3 Encounters:  11/18/14 96%  11/11/14 95%  08/03/14 98%     Past Medical History  Diagnosis Date  . GERD (gastroesophageal reflux disease)   . Hypertension   . Hyperlipidemia   . Glaucoma   . Urinary incontinence   . Edema   . Anemia   . Arthritis   . DVT (deep venous thrombosis)     in eye, and leg  . Osteoporosis   . Ulcer   . Hernia   . Deaf     Congenital; Requires an interpreter  . Uterine polyp   . Hiatal hernia   . Sleep apnea   . Clotting disorder   . Thyroid disease   . Cataract    Past Surgical History  Procedure Laterality Date  . Cataract extraction      both eyes  . Goiter removed      age 63  . Xanthoma tumor removed    . Refractive surgery    . Dilatation & currettage/hysteroscopy with resectocope N/A 10/11/2012    Procedure: DILATATION & CURETTAGE/HYSTEROSCOPY WITH RESECTOCOPE;  Surgeon:  Allena Katz, MD;  Location: [facility];  Service: Gynecology;  Laterality: N/A;  pt is deaf; please contact daughter Heide Spark [phone]  . Eye surgery    . Hernia repair    . Tonsillectomy    . Hiatal hernia repair N/A 12/08/2013    Procedure: LAPAROSCOPIC REPAIR OF HIATAL HERNIA  ;  Surgeon: [name], MD;  Location: [facility];  Service: General;  Laterality: N/A;   History   Social History  . Marital Status: Married    Spouse Name: N/A  . Number of Children: 3  . Years of Education: Grad    Occupational History  . Teacher- Retired    Social History Main Topics  . Smoking status: Never Smoker   . Smokeless tobacco: Never Used  . Alcohol Use: No     Comment: occasionally  . Drug Use: No  . Sexual Activity: Not on file   Other Topics Concern  . None   Social History Narrative   Marital status: married; husband is deaf as well.      Children: 3 children..      Lives: with husband and daughter.  Moved from [place]; still  has home in [place] but plans to remain in [place].   Family History  Problem Relation Age of Onset  . Esophageal cancer Neg Hx   . Rectal cancer Neg Hx   . Stomach cancer Neg Hx   . Cancer Mother     colon  . Stroke Mother   . Heart disease Mother   . Hypertension Mother   . Heart disease Father     31 yrs old  . Heart disease Brother     MI,   . Hernia Brother   . Hyperlipidemia Brother   . Migraines Daughter   . Heart disease Paternal Grandmother    Allergies  Allergen Reactions  . Avastin [Bevacizumab] Nausea And Vomiting  . Clindamycin/Lincomycin Itching  . Novocain [Procaine] Other (See Comments)    seizures  . Sulfa Antibiotics Other (See Comments)    fever   Prior to Admission medications   Medication Sig Start Date End Date Taking? Authorizing Provider  aspirin 81 MG tablet Take 81 mg by mouth every morning.    Yes Historical Provider, MD  cloNIDine (CATAPRES) 0.1 MG tablet TAKE 1 TABLET BY MOUTH TWICE A DAY   Yes   P , DO  dorzolamide-timolol (COSOPT) 22.3-6.8 MG/ML ophthalmic solution Place 1 drop into the right eye 2 (two) times daily.   Yes Historical Provider, MD  furosemide (LASIX) 20 MG tablet TAKE 1-2 TABLET (20 MG TOTAL) BY MOUTH EVERY MORNING. 11/11/14  Yes Wardell Honour, MD  meloxicam (MOBIC) 15 MG tablet TAKE 1 TABLET (15 MG TOTAL) BY MOUTH DAILY. 07/08/14  Yes Wardell Honour, MD  metoprolol (LOPRESSOR) 50 MG tablet Take 1 tablet (50 mg total) by mouth 2 (two) times daily. PATIENT NEEDS OFFICE VISIT FOR ADDITIONAL REFILLS 10/30/14  Yes  P , DO  omeprazole (PRILOSEC) 20 MG capsule Take 20 mg by mouth 2 (two) times daily before a meal.   Yes Historical Provider, MD  ramipril (ALTACE) 10 MG capsule Take 10 mg by mouth every morning.   Yes Historical Provider, MD  simvastatin (ZOCOR) 40 MG tablet Take 1 tablet (40 mg total) by mouth at bedtime. Take 1 tablet after dinner. 08/13/14  Yes Posey Boyer, MD  tolterodine (DETROL) 2 MG tablet Take 1 tablet (2 mg total) by mouth 2 (two) times daily. 11/11/14  Yes Wardell Honour, MD  zolpidem (AMBIEN) 10 MG tablet Take one half to one at bedtime as needed for sleep. 08/03/14  Yes Posey Boyer, MD     ROS: The patient denies fevers, night sweats, unintentional weight loss, chest pain, palpitations, wheezing, dyspnea on exertion, nausea, vomiting, abdominal pain, dysuria, hematuria, melena, numbness, weakness, or tingling.   All other systems have been reviewed and were otherwise negative with the exception of those mentioned in the HPI and as above.    PHYSICAL EXAM: Filed Vitals:   11/18/14 1850  BP: 120/78  Pulse: 69  Temp: 98.2 F (36.8 C)   Filed Vitals:   11/18/14 1850  Height: 5\' 3"  (1.6 m)  Weight: 206 lb 4 oz (93.554 kg)   Body mass index is 36.54 kg/(m^2).  General: Alert, no acute distress HEENT:  Normocephalic, atraumatic, oropharynx patent. EOMI, PERRLA She has a lot of mucus, and green sputum production around the  oropharynx without any actual exudates. Tympanic membrane is normal. No sinus tenderness. No appreciable abscesses palpated Cardiovascular:  Regular rate and rhythm, no rubs murmurs or gallops.  No Carotid bruits, radial pulse intact. No  pedal edema.  Respiratory: Clear to auscultation bilaterally.  No wheezes, rales, or rhonchi.  No cyanosis, no use of accessory musculature GI: No organomegaly, abdomen is soft and non-tender, positive bowel sounds.  No masses. Skin: No rashes. Neurologic: Facial musculature symmetric. Psychiatric: Patient is appropriate throughout our interaction. Lymphatic: No cervical lymphadenopathy Musculoskeletal: Gait intact.   LABS: Results for orders placed or performed in visit on 11/18/14  POCT CBC  Result Value Ref Range   WBC 10.2 4.6 - 10.2 K/uL   Lymph, poc 2.2 0.6 - 3.4   POC LYMPH PERCENT 22.0 10 - 50 %L   MID (cbc) 0.6 0 - 0.9   POC MID % 6.2 0 - 12 %M   POC Granulocyte 7.3 (A) 2 - 6.9   Granulocyte percent 71.8 37 - 80 %G   RBC 4.16 4.04 - 5.48 M/uL   Hemoglobin 12.5 12.2 - 16.2 g/dL   HCT, POC 38.9 37.7 - 47.9 %   MCV 93.4 80 - 97 fL   MCH, POC 30.1 27 - 31.2 pg   MCHC 32.3 31.8 - 35.4 g/dL   RDW, POC 16.9 %   Platelet Count, POC 161 142 - 424 K/uL   MPV 7.9 0 - 99.8 fL  POCT rapid strep A  Result Value Ref Range   Rapid Strep A Screen Negative Negative     EKG/XRAY:   Primary read interpreted by Dr. Marin Comment at [facility]. ? Increase density left lower lobe    ASSESSMENT/PLAN: Encounter Diagnoses  Name Primary?  . Acute pharyngitis, unspecified pharyngitis type Yes  . Cough   . Lower respiratory infection (e.g., bronchitis, pneumonia, pneumonitis, pulmonitis)    Pleasant 79 year old deaf female with pneumonia versus acute bronchitis Will treat as if she has a lower respiratory infection consistent with pneumonia versus acute bronchitis. I suspect she has more of a left lower pneumonia since there is some increased markings and density on  chest x-ray. Treat with azithromycin, Mucinex. She currently does not have any wheezing or shortness of breath so we will on any breathing treatments Follow-up as needed, precautions given to her to return if she is not feeling better in 48 hours  Gross sideeffects, risk and benefits, and alternatives of medications d/w patient. Patient is aware that all medications have potential sideeffects and we are unable to predict every sideeffect or drug-drug interaction that may occur.  , [place], DO 11/18/2014 8:40 PM

## 2014-11-18 NOTE — Addendum Note (Signed)
Addended byWalden Field A on: 11/18/2014 11:55 AM   Modules accepted: Orders

## 2014-11-18 NOTE — Telephone Encounter (Signed)
Yes ok to order mammogram for pt.

## 2014-11-18 NOTE — Patient Instructions (Signed)
You were given Mucinex for cough. You need to cough regular. You have also been given azithromycin which is an antibiotic to take to cover for infection in the lungs. This could be pneumonia or acute bronchitis. If you are not feeling better in 2 days or have worsening symptoms you need to come back and get rechecked.

## 2014-11-18 NOTE — Telephone Encounter (Signed)
Mammogram ordered

## 2014-11-19 ENCOUNTER — Telehealth: Payer: Self-pay

## 2014-11-19 NOTE — Telephone Encounter (Signed)
Lm aboutt xray results, bronchitis, no pna

## 2014-11-19 NOTE — Telephone Encounter (Signed)
Pt is deaf and used a deaf service to call - Wants Dr. Marin Comment to call her back today and let her know if she can go out tonight or not.  She says you will know what she is talking about.  Advises to leave a message at [phone].

## 2014-11-20 ENCOUNTER — Ambulatory Visit (INDEPENDENT_AMBULATORY_CARE_PROVIDER_SITE_OTHER): Payer: Medicare Other | Admitting: Physician Assistant

## 2014-11-20 ENCOUNTER — Other Ambulatory Visit: Payer: Self-pay | Admitting: Radiology

## 2014-11-20 VITALS — BP 102/70 | HR 55 | Temp 98.5°F | Resp 18 | Ht 63.0 in | Wt 202.0 lb

## 2014-11-20 DIAGNOSIS — H109 Unspecified conjunctivitis: Secondary | ICD-10-CM | POA: Diagnosis not present

## 2014-11-20 DIAGNOSIS — H9193 Unspecified hearing loss, bilateral: Secondary | ICD-10-CM | POA: Diagnosis not present

## 2014-11-20 LAB — CULTURE, GROUP A STREP: Organism ID, Bacteria: NORMAL

## 2014-11-20 MED ORDER — ALENDRONATE SODIUM 70 MG PO TABS: 70.0000 mg | ORAL_TABLET | ORAL | Status: DC

## 2014-11-20 MED ORDER — OFLOXACIN 0.3 % OP SOLN: 1.0000 [drp] | Freq: Four times a day (QID) | OPHTHALMIC | Status: DC

## 2014-11-20 NOTE — Patient Instructions (Addendum)
Use the drops in both eyes. One drop in each eye 4 times each day. If your eyes are not well by Monday, you should call Dr. Zenia Resides office again. Conjunctivitis can be very contagious. You should wash the bed linens, and disinfect all surfaces several times each day until the drainage has stopped, to prevent spreading the infection to others (especially your husband!).  You should complete the azithromycin for the bronchitis.  Be sure to drink plenty of water.

## 2014-11-20 NOTE — Progress Notes (Signed)
Patient ID: Melissa Murphy, female    DOB: 1936/05/08, 79 y.o.   MRN: [id number]  PCP: Reginia Forts, MD  Subjective:   Chief Complaint  Patient presents with  . Red eyes    X last night, worse last night, pus in eyes. Wants to know if it is the result of her abx so she id dnot take her abx today    HPI Patient presents for evaluation of eye irritation and drainage that began last night. She contacted her eye doctor's office, but they were closed today.  The patient is deaf. Today's encounter was performed in writing.  Her eyes are less red now than last night and this morning, but continue to drain. She has crusting on the lashes and corners of her eyes that must be repeatedly wiped away. She had reduced vision last night, but it's normal today. She wears glasses. No recalled history of FB to the eyes or trauma. She denies pain. Feels tired and sleepy.  She was recently diagnosed with a lower respiratory tract infection and started on azithromycin. She's concerned that this medication may be causing/contributing to the eye symptoms, so has not taken today's dose. She notes less chest wall pain with coughing.   Her husband has a cold and allergies. No other sick contacts.   Review of Systems Review of Systems  Constitutional: Positive for fatigue. Negative for fever, chills and diaphoresis.  HENT: Positive for hearing loss (patient is deaf). Negative for congestion, ear pain, postnasal drip, rhinorrhea, sinus pressure, sore throat and trouble swallowing.   Eyes: Positive for discharge, redness and visual disturbance (last night, now resolved). Negative for photophobia, pain and itching.  Respiratory: Positive for cough.        Patient Active Problem List   Diagnosis Date Noted  . Deafness 04/03/2014  . Urge incontinence of urine 04/01/2014  . Primary osteoarthritis of left knee 04/01/2014  . Insomnia 04/01/2014  . Hiatal hernia 12/08/2013  . Preop cardiovascular exam  09/25/2013  . Obstructive sleep apnea (adult) (pediatric) 01/10/2013  . Uterine polyp   . Unspecified chronic bronchitis 10/02/2012  . 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 Early satiety 09/10/2012  . Bradycardia 08/19/2012  . Hypertension 04/30/2012  . Edema 09/18/2011  . Leg pain, bilateral 09/18/2011  . Stool color abnormal 09/18/2011  . Anemia 09/18/2011     Prior to Admission medications   Medication Sig Start Date End Date Taking? Authorizing Provider  alendronate (FOSAMAX) 70 MG tablet Take 1 tablet (70 mg total) by mouth once a week. Take with a full glass of water on an empty stomach. Take one pill each Saturday morning, 30 minutes before meal, avoid lying down for 30 minutes after taking. 11/20/14  Yes Posey Boyer, MD  aspirin 81 MG tablet Take 81 mg by mouth every morning.    Yes Historical Provider, MD  azithromycin (ZITHROMAX) 250 MG tablet Take 2 tabs po now then 1 tab po daily for the next 4 days. 11/18/14  Yes Thao P Le, DO  cloNIDine (CATAPRES) 0.1 MG tablet TAKE 1 TABLET BY MOUTH TWICE A DAY   Yes Thao P Le, DO  dorzolamide-timolol (COSOPT) 22.3-6.8 MG/ML ophthalmic solution Place 1 drop into the right eye 2 (two) times daily.   Yes Historical Provider, MD  furosemide (LASIX) 20 MG tablet TAKE 1-2 TABLET (20 MG TOTAL) BY MOUTH EVERY MORNING. 11/11/14  Yes Wardell Honour, MD  guaiFENesin (MUCINEX) 600 MG 12 hr tablet Take 1 tablet (600 mg  total) by mouth 2 (two) times daily as needed. 11/18/14  Yes Thao P Le, DO  meloxicam (MOBIC) 15 MG tablet TAKE 1 TABLET (15 MG TOTAL) BY MOUTH DAILY. 07/08/14  Yes Wardell Honour, MD  metoprolol (LOPRESSOR) 50 MG tablet Take 1 tablet (50 mg total) by mouth 2 (two) times daily. PATIENT NEEDS OFFICE VISIT FOR ADDITIONAL REFILLS 10/30/14  Yes Thao P Le, DO  omeprazole (PRILOSEC) 20 MG capsule Take 20 mg by mouth 2 (two) times daily before a meal.   Yes Historical Provider, MD  ramipril (ALTACE) 10 MG capsule Take 10 mg by mouth every  morning.   Yes Historical Provider, MD  simvastatin (ZOCOR) 40 MG tablet Take 1 tablet (40 mg total) by mouth at bedtime. Take 1 tablet after dinner. 08/13/14  Yes Posey Boyer, MD  tolterodine (DETROL) 2 MG tablet Take 1 tablet (2 mg total) by mouth 2 (two) times daily. 11/11/14  Yes Wardell Honour, MD  zolpidem (AMBIEN) 10 MG tablet Take one half to one at bedtime as needed for sleep. 08/03/14  Yes Posey Boyer, MD     Allergies  Allergen Reactions  . Avastin [Bevacizumab] Nausea And Vomiting  . Clindamycin/Lincomycin Itching  . Novocain [Procaine] Other (See Comments)    seizures  . Sulfa Antibiotics Other (See Comments)    fever       Objective:  Physical Exam  Physical Exam  Constitutional: She is oriented to person, place, and time. She appears well-developed and well-nourished. She is active and cooperative. No distress.  BP 102/70 mmHg  Pulse 55  Temp(Src) 98.5 F (36.9 C) (Oral)  Resp 18  Ht 5\' 3"  (1.6 m)  Wt 202 lb (91.627 kg)  BMI 35.79 kg/m2  SpO2 96%   HENT:  Head: Normocephalic and atraumatic.  Right Ear: External ear normal.  Left Ear: External ear normal.  Eyes: EOM are normal. Pupils are equal, round, and reactive to light. Right eye exhibits discharge. Left eye exhibits discharge. Right conjunctiva is injected. Right conjunctiva has no hemorrhage. Left conjunctiva is injected. Left conjunctiva has no hemorrhage. No scleral icterus.  Fundoscopic exam:      The right eye shows no exudate, no hemorrhage and no papilledema. The right eye shows red reflex.       The left eye shows no exudate, no hemorrhage and no papilledema. The left eye shows red reflex.  Pulmonary/Chest: Effort normal.  Neurological: She is alert and oriented to person, place, and time.  Psychiatric: She has a normal mood and affect. Her behavior is normal.           Assessment & Plan:  1. Bilateral conjunctivitis Her conjunctivitis may be viral, but with purulent drainage now  for almost 24 hours, elect to cover with antibiotic drops. If no significant improvement by Monday 4/11, she'll follow up with her regular ophthalmologist. - ofloxacin (OCUFLOX) 0.3 % ophthalmic solution; Place 1 drop into both eyes 4 (four) times daily.  Dispense: 5 mL; Refill: 0  Fara Chute, PA-C Physician Assistant-Certified [facility]

## 2014-11-25 ENCOUNTER — Ambulatory Visit: Payer: Medicare Other

## 2014-11-25 ENCOUNTER — Ambulatory Visit (INDEPENDENT_AMBULATORY_CARE_PROVIDER_SITE_OTHER): Payer: Medicare Other | Admitting: Family Medicine

## 2014-11-25 VITALS — BP 110/78 | HR 51 | Temp 98.5°F | Resp 24 | Ht 63.0 in | Wt 202.4 lb

## 2014-11-25 DIAGNOSIS — J189 Pneumonia, unspecified organism: Secondary | ICD-10-CM

## 2014-11-25 DIAGNOSIS — M810 Age-related osteoporosis without current pathological fracture: Secondary | ICD-10-CM | POA: Diagnosis not present

## 2014-11-25 MED ORDER — CEFTRIAXONE SODIUM 1 G IJ SOLR
1.0000 g | INTRAMUSCULAR | Status: DC
  Administered 2014-11-25: 1 g via INTRAMUSCULAR

## 2014-11-25 MED ORDER — PREDNISONE 20 MG PO TABS: 40.0000 mg | ORAL_TABLET | Freq: Every day | ORAL | Status: DC

## 2014-11-25 MED ORDER — ALENDRONATE SODIUM 70 MG PO TABS: 70.0000 mg | ORAL_TABLET | ORAL | Status: DC

## 2014-11-25 MED ORDER — ALBUTEROL SULFATE (2.5 MG/3ML) 0.083% IN NEBU
2.5000 mg | INHALATION_SOLUTION | Freq: Once | RESPIRATORY_TRACT | Status: AC
  Administered 2014-11-25: 2.5 mg via RESPIRATORY_TRACT

## 2014-11-25 NOTE — Patient Instructions (Signed)
You sound much better after the breathing treatment. The shot will help you get better faster along with the same medicine I called in for Mr. Melissa Murphy: 2 pills daily with food

## 2014-11-25 NOTE — Progress Notes (Addendum)
Patient ID: Melissa Murphy, female   DOB: [birth year], 79 y.o.   MRN: [id number] .   This chart was scribed for Robyn Haber, MD by [facility] medical scribe at [facility].The patient was seen in exam room 03 and the patient's care was started at 8:04 PM.  Patient ID: Melissa Murphy MRN: [id number], DOB: 1936/01/17, 79 y.o. Date of Encounter: 11/25/2014  Primary Physician: Reginia Forts, MD  Chief Complaint:  Chief Complaint  Patient presents with  . Bronchitis    x 1 week-no better.    HPI:  Melissa Murphy is a 79 y.o. female who presents to [facility] complaining of a bronchitis ongoing for one week now. She has a productive cough as associated symptoms. Her cough is producing a yellow phlegm. Pt has similar symptoms as Mr. Wiederhold   Past Medical History  Diagnosis Date  . GERD (gastroesophageal reflux disease)   . Hypertension   . Hyperlipidemia   . Glaucoma   . Urinary incontinence   . Edema   . Anemia   . Arthritis   . DVT (deep venous thrombosis)     in eye, and leg  . Osteoporosis   . Ulcer   . Hernia   . Deaf     Congenital; Requires an interpreter  . Uterine polyp   . Hiatal hernia   . Sleep apnea   . Clotting disorder   . Thyroid disease   . Cataract     Home Meds: Prior to Admission medications   Medication Sig Start Date End Date Taking? Authorizing Provider  alendronate (FOSAMAX) 70 MG tablet Take 1 tablet (70 mg total) by mouth once a week. Take with a full glass of water on an empty stomach. Take one pill each Saturday morning, 30 minutes before meal, avoid lying down for 30 minutes after taking. 11/20/14  Yes Posey Boyer, MD  aspirin 81 MG tablet Take 81 mg by mouth every morning.    Yes Historical Provider, MD  cloNIDine (CATAPRES) 0.1 MG tablet TAKE 1 TABLET BY MOUTH TWICE A DAY   Yes Thao P Le, DO  dorzolamide-timolol (COSOPT) 22.3-6.8 MG/ML ophthalmic solution Place 1 drop into the right eye 2 (two) times  daily.   Yes Historical Provider, MD  furosemide (LASIX) 20 MG tablet TAKE 1-2 TABLET (20 MG TOTAL) BY MOUTH EVERY MORNING. 11/11/14  Yes Wardell Honour, MD  guaiFENesin (MUCINEX) 600 MG 12 hr tablet Take 1 tablet (600 mg total) by mouth 2 (two) times daily as needed. 11/18/14  Yes Thao P Le, DO  meloxicam (MOBIC) 15 MG tablet TAKE 1 TABLET (15 MG TOTAL) BY MOUTH DAILY. 07/08/14  Yes Wardell Honour, MD  metoprolol (LOPRESSOR) 50 MG tablet Take 1 tablet (50 mg total) by mouth 2 (two) times daily. PATIENT NEEDS OFFICE VISIT FOR ADDITIONAL REFILLS 10/30/14  Yes Thao P Le, DO  ofloxacin (OCUFLOX) 0.3 % ophthalmic solution Place 1 drop into both eyes 4 (four) times daily. 11/20/14  Yes Chelle S Jeffery, PA-C  omeprazole (PRILOSEC) 20 MG capsule Take 20 mg by mouth 2 (two) times daily before a meal.   Yes Historical Provider, MD  ramipril (ALTACE) 10 MG capsule Take 10 mg by mouth every morning.   Yes Historical Provider, MD  simvastatin (ZOCOR) 40 MG tablet Take 1 tablet (40 mg total) by mouth at bedtime. Take 1 tablet after dinner. 08/13/14  Yes Posey Boyer, MD  tolterodine (DETROL) 2 MG tablet  Take 1 tablet (2 mg total) by mouth 2 (two) times daily. 11/11/14  Yes Wardell Honour, MD  zolpidem (AMBIEN) 10 MG tablet Take one half to one at bedtime as needed for sleep. 08/03/14  Yes Posey Boyer, MD   Allergies:  Allergies  Allergen Reactions  . Avastin [Bevacizumab] Nausea And Vomiting  . Clindamycin/Lincomycin Itching  . Novocain [Procaine] Other (See Comments)    seizures  . Sulfa Antibiotics Other (See Comments)    fever   History   Social History  . Marital Status: Married    Spouse Name: N/A  . Number of Children: 3  . Years of Education: Grad    Occupational History  . Teacher- Retired    Social History Main Topics  . Smoking status: Never Smoker   . Smokeless tobacco: Never Used  . Alcohol Use: No     Comment: occasionally  . Drug Use: No  . Sexual Activity: Not on file    Other Topics Concern  . Not on file   Social History Narrative   Marital status: married; husband is deaf as well.      Children: 3 children..      Lives: with husband and daughter.  Moved from [place]; still has home in [place] but plans to remain in [place].    Review of Systems: Constitutional: negative for chills, fever, night sweats, weight changes, or fatigue  HEENT: negative for vision changes, hearing loss, congestion, rhinorrhea, ST, epistaxis, or sinus pressure Cardiovascular: negative for chest pain or palpitations Respiratory: negative for hemoptysis, wheezing, shortness of breath. Positive for cough. Abdominal: negative for abdominal pain, nausea, vomiting, diarrhea, or constipation Dermatological: negative for rash Neurologic: negative for headache, dizziness, or syncope All other systems reviewed and are otherwise negative with the exception to those above and in the HPI.  Physical Exam: Blood pressure 110/78, pulse 51, temperature 98.5 F (36.9 C), temperature source Oral, resp. rate 24, height 5\' 3"  (1.6 m), weight 202 lb 6 oz (91.797 kg), SpO2 96 %., Body mass index is 35.86 kg/(m^2). General: Well developed, well nourished, in no acute distress. Head: Normocephalic, atraumatic, eyes without discharge, sclera non-icteric, nares are without discharge. Bilateral auditory canals clear, TM's are without perforation, pearly grey and translucent with reflective cone of light bilaterally. Oral cavity moist, posterior pharynx without exudate, erythema, peritonsillar abscess, or post nasal drip.  Neck: Supple. No thyromegaly. Full ROM. No lymphadenopathy. Lungs: Clear bilaterally to auscultation without wheezes, rales, or rhonchi. Breathing is unlabored. Expiratory wheezing bilaterally Heart: RRR with S1 S2. No murmurs, rubs, or gallops appreciated. Abdomen: Soft, non-tender, non-distended with normoactive bowel sounds. No hepatomegaly. No rebound/guarding. No obvious  abdominal masses. Msk:  Strength and tone normal for age. Extremities/Skin: Warm and dry. No clubbing or cyanosis. No edema. No rashes or suspicious lesions. Neuro: Alert and oriented X 3. Moves all extremities spontaneously. Gait is normal. CNII-XII grossly in tact. Psych:  Responds to questions appropriately with a normal affect.   Result     CLINICAL DATA: Initial encounter for cough  EXAM: CHEST 2 VIEW  COMPARISON: 12/03/2013.  FINDINGS: Lung volumes are low. No edema or focal airspace consolidation. The cardio pericardial silhouette is enlarged. Imaged bony structures of the thorax are intact.  IMPRESSION: No acute cardiopulmonary findings.   Electronically Signed  By: Misty Stanley M.D.  On: 11/18/2014 20:40    Patient improved markedly after a breathing treatment with albuterol and the wheezing was also decreased.  ASSESSMENT AND PLAN:  79  y.o. year old female with bronchitis  Rocephin 1 g IM tonight Prednisone 40 mg daily 5 days  Signed, Robyn Haber, MD 11/25/2014 8:22 PM

## 2014-11-30 ENCOUNTER — Other Ambulatory Visit: Payer: Self-pay | Admitting: Family Medicine

## 2014-12-02 ENCOUNTER — Other Ambulatory Visit: Payer: Self-pay

## 2014-12-02 DIAGNOSIS — G479 Sleep disorder, unspecified: Secondary | ICD-10-CM

## 2014-12-02 NOTE — Telephone Encounter (Signed)
Patient is requesting medication refill for zoldiem-tartare sent to [facility] on [address]

## 2014-12-03 ENCOUNTER — Other Ambulatory Visit: Payer: Self-pay | Admitting: Family Medicine

## 2014-12-03 NOTE — Telephone Encounter (Signed)
Dr Tamala Julian, I wasn't sure who to send this to. Pt est care with you as PCP 04/01/14 and you discussed insomnia but don't see that you have Rxd this for pt yet. She has appt sch for 12/30/14. Dr Linna Darner wrote this for her in Dec which pharm never got and pt lost and then he replaced it on 09/11/14 #30 w/2RFs (last fill on 3/26 per pharm). Do you want to give a RF, or send to Dr Linna Darner (he's not in office again until Monday).

## 2014-12-04 ENCOUNTER — Telehealth: Payer: Self-pay

## 2014-12-04 MED ORDER — ZOLPIDEM TARTRATE 5 MG PO TABS: ORAL_TABLET | ORAL | Status: DC

## 2014-12-04 NOTE — Telephone Encounter (Signed)
Called in Rx and notified pt's daughter on VM per instr's in chart (d/t pt's hearing impairment) and explained new dose.

## 2014-12-04 NOTE — Telephone Encounter (Signed)
OK to refill medication; please call in refills.  I am sending in 5mg  tablets instead of 10mg  tablets due to new guidelines.

## 2014-12-04 NOTE — Telephone Encounter (Signed)
Patient's ASL interpreter called for the patient about her prescription for zolpidem (AMBIEN) 5 MG tablet.  She would like [facility] to [facility] [address].  CB#: [phone] Dola Argyle, [place] interpreter)

## 2014-12-05 ENCOUNTER — Other Ambulatory Visit: Payer: Self-pay | Admitting: Family Medicine

## 2014-12-07 NOTE — Telephone Encounter (Signed)
I had already called in this Rx morning of 12/04/14 before this message was left. I called and LMOM for Suzie, interpreter, to make sure pt is aware Rx was sent.

## 2014-12-09 ENCOUNTER — Telehealth: Payer: Self-pay

## 2014-12-09 NOTE — Telephone Encounter (Signed)
Pt requesting refill on HYDROcodone-ibuprofen (VICOPROFEN) 7.5-200 MG per tablet [[id number]]

## 2014-12-10 NOTE — Telephone Encounter (Signed)
I have never prescribed Hydrocodone-Ibuprofen for patient.  Why does she need it?

## 2014-12-10 NOTE — Telephone Encounter (Signed)
Left message for pt to call back  °

## 2014-12-11 DIAGNOSIS — Z961 Presence of intraocular lens: Secondary | ICD-10-CM | POA: Diagnosis not present

## 2014-12-11 DIAGNOSIS — H00022 Hordeolum internum right lower eyelid: Secondary | ICD-10-CM | POA: Diagnosis not present

## 2014-12-12 ENCOUNTER — Telehealth: Payer: Self-pay

## 2014-12-12 NOTE — Telephone Encounter (Signed)
rx refill of omeprazole

## 2014-12-14 NOTE — Telephone Encounter (Signed)
Can we Rx this, it seems we never wrote for this.

## 2014-12-14 NOTE — Telephone Encounter (Signed)
She can get that OTC.

## 2014-12-16 NOTE — Telephone Encounter (Signed)
LMOM for daughter to purchase OTC, or she can discuss at next OV if she would like [place] to Rx it instead.

## 2014-12-18 ENCOUNTER — Telehealth: Payer: Self-pay | Admitting: Family Medicine

## 2014-12-18 ENCOUNTER — Other Ambulatory Visit: Payer: Self-pay | Admitting: Family Medicine

## 2014-12-18 NOTE — Telephone Encounter (Signed)
Dr Tamala Julian- Please advise.

## 2014-12-18 NOTE — Telephone Encounter (Signed)
Patient states that she isn't sleeping well with the Ambien 5mg . Can she be switched back to 10.5mg ?   [phone]

## 2014-12-21 NOTE — Telephone Encounter (Signed)
Call --- please call and explain to patient that Ambien 10mg  is a really high risk medication for patient due to her age.  Has she every tried anything else for insomnia before?  Has she ever taken Trazodone or Sonata?

## 2014-12-22 NOTE — Telephone Encounter (Signed)
lmom to cb. 

## 2014-12-23 NOTE — Telephone Encounter (Signed)
Spoke with interpreter--pt is deaf. She says she has been on these for years and she doesn't know what the problem is. She has appt next week, will discuss with Dr Tamala Julian then.

## 2014-12-23 NOTE — Telephone Encounter (Signed)
Called pt, no answer.

## 2014-12-30 ENCOUNTER — Encounter: Payer: Self-pay | Admitting: Family Medicine

## 2014-12-30 ENCOUNTER — Ambulatory Visit (INDEPENDENT_AMBULATORY_CARE_PROVIDER_SITE_OTHER): Payer: Medicare Other | Admitting: Family Medicine

## 2014-12-30 VITALS — BP 144/82 | HR 62 | Temp 98.0°F | Resp 16 | Ht 63.75 in | Wt 207.2 lb

## 2014-12-30 DIAGNOSIS — G4733 Obstructive sleep apnea (adult) (pediatric): Secondary | ICD-10-CM

## 2014-12-30 DIAGNOSIS — N3941 Urge incontinence: Secondary | ICD-10-CM

## 2014-12-30 DIAGNOSIS — R609 Edema, unspecified: Secondary | ICD-10-CM

## 2014-12-30 DIAGNOSIS — E7800 Pure hypercholesterolemia, unspecified: Secondary | ICD-10-CM

## 2014-12-30 DIAGNOSIS — I10 Essential (primary) hypertension: Secondary | ICD-10-CM | POA: Diagnosis not present

## 2014-12-30 DIAGNOSIS — Z0000 Encounter for general adult medical examination without abnormal findings: Secondary | ICD-10-CM

## 2014-12-30 DIAGNOSIS — Z6835 Body mass index (BMI) 35.0-35.9, adult: Secondary | ICD-10-CM | POA: Diagnosis not present

## 2014-12-30 DIAGNOSIS — G4700 Insomnia, unspecified: Secondary | ICD-10-CM

## 2014-12-30 DIAGNOSIS — H9193 Unspecified hearing loss, bilateral: Secondary | ICD-10-CM

## 2014-12-30 DIAGNOSIS — M1712 Unilateral primary osteoarthritis, left knee: Secondary | ICD-10-CM | POA: Diagnosis not present

## 2014-12-30 DIAGNOSIS — M810 Age-related osteoporosis without current pathological fracture: Secondary | ICD-10-CM

## 2014-12-30 DIAGNOSIS — G479 Sleep disorder, unspecified: Secondary | ICD-10-CM

## 2014-12-30 MED ORDER — SIMVASTATIN 40 MG PO TABS: 40.0000 mg | ORAL_TABLET | Freq: Every day | ORAL | Status: DC

## 2014-12-30 MED ORDER — CLONIDINE HCL 0.1 MG PO TABS: 0.1000 mg | ORAL_TABLET | Freq: Two times a day (BID) | ORAL | Status: DC

## 2014-12-30 MED ORDER — ALENDRONATE SODIUM 70 MG PO TABS: 70.0000 mg | ORAL_TABLET | ORAL | Status: DC

## 2014-12-30 MED ORDER — FUROSEMIDE 20 MG PO TABS: ORAL_TABLET | ORAL | Status: DC

## 2014-12-30 MED ORDER — RAMIPRIL 10 MG PO CAPS: 10.0000 mg | ORAL_CAPSULE | Freq: Every day | ORAL | Status: DC

## 2014-12-30 MED ORDER — METOPROLOL TARTRATE 50 MG PO TABS: ORAL_TABLET | ORAL | Status: DC

## 2014-12-30 MED ORDER — ZOLPIDEM TARTRATE 10 MG PO TABS: ORAL_TABLET | ORAL | Status: DC

## 2014-12-30 NOTE — Patient Instructions (Signed)

## 2014-12-30 NOTE — Progress Notes (Signed)
   Subjective:    Patient ID: Melissa Murphy, female    DOB: 10/27/1935, 79 y.o.   MRN: [id number]  HPI    Review of Systems  Constitutional: Positive for chills.  Eyes: Negative.   Respiratory: Negative.   Cardiovascular: Negative.   Gastrointestinal: Positive for diarrhea and constipation.  Endocrine: Positive for cold intolerance and heat intolerance.  Allergic/Immunologic: Negative.   Neurological: Negative.   Hematological: Negative.   Psychiatric/Behavioral: Positive for sleep disturbance.       Objective:   Physical Exam        Assessment & Plan:

## 2014-12-30 NOTE — Progress Notes (Addendum)
Subjective:    Patient ID: Melissa Murphy, female    DOB: May 20, 1936, 79 y.o.   MRN: [id number]  12/30/2014  Annual Exam   HPI This 79 y.o. female presents for Annual Wellness Examination.  Last physical: in [place]; no CPE in [place]; three years ago.  Pap smear:  2015; [place]? [facility] [place]:  2015 at OB[facility]N. Colonoscopy:  04-22-2012 Deatra Ina; +polyp; repeat 5 years Bone density:  11-18-2014 TDAP: in [place] 5 years ago. Pneumovax:  04-01-2014 Zostavax: in [place] Influenza:  04-01-2014 Eye exam:   Every six months; +glasses; goes once per month.  Zigmund Daniel performs injections; oscillation. Dental exam:  +dentures  Insomnia: not sleeping with Ambien 5mg  qhs; will sleep 3-4 hours per night only; understands the high risk nature of Ambien but needs to sleep well at night; having to take naps every afternoon due to poor sleep at night. Mood is good; denies depression or anxiety.  Chronic leg swelling: still awaiting compression stocking delivery; s/p vein specialist consultation by Dr. Renaldo Reel.    Hypertension: Patient reports good compliance with medication, good tolerance to medication, and good symptom control.  Denies CP/palp/SOB/leg swelling.  Hyperlipidemia: Patient reports good compliance with medication, good tolerance to medication, and good symptom control.  Denies HA/dizziness/focal weakness/paresthesias.       Review of Systems  Constitutional: Negative for fever, chills, diaphoresis, activity change, appetite change, fatigue and unexpected weight change.  HENT: Negative for congestion, dental problem, drooling, ear discharge, ear pain, facial swelling, hearing loss, mouth sores, nosebleeds, postnasal drip, rhinorrhea, sinus pressure, sneezing, sore throat, tinnitus, trouble swallowing and voice change.   Eyes: Negative for photophobia, pain, discharge, redness, itching and visual disturbance.  Respiratory: Negative for apnea, cough,  choking, chest tightness, shortness of breath, wheezing and stridor.   Cardiovascular: Negative for chest pain, palpitations and leg swelling.  Gastrointestinal: Negative for nausea, vomiting, abdominal pain, diarrhea, constipation, blood in stool, abdominal distention, anal bleeding and rectal pain.  Endocrine: Negative for cold intolerance, heat intolerance, polydipsia, polyphagia and polyuria.  Genitourinary: Negative for dysuria, urgency, frequency, hematuria, flank pain, decreased urine volume, vaginal bleeding, vaginal discharge, enuresis, difficulty urinating, genital sores, vaginal pain, menstrual problem, pelvic pain and dyspareunia.  Musculoskeletal: Negative for myalgias, back pain, joint swelling, arthralgias, gait problem, neck pain and neck stiffness.  Skin: Negative for color change, pallor, rash and wound.  Allergic/Immunologic: Negative for environmental allergies, food allergies and immunocompromised state.  Neurological: Negative for dizziness, tremors, seizures, syncope, facial asymmetry, speech difficulty, weakness, light-headedness, numbness and headaches.  Hematological: Negative for adenopathy. Does not bruise/bleed easily.  Psychiatric/Behavioral: Negative for suicidal ideas, hallucinations, behavioral problems, confusion, sleep disturbance, self-injury, dysphoric mood, decreased concentration and agitation. The patient is not nervous/anxious and is not hyperactive.     Past Medical History  Diagnosis Date  . GERD (gastroesophageal reflux disease)   . Hypertension   . Hyperlipidemia   . Glaucoma   . Urinary incontinence   . Edema   . Anemia   . Arthritis   . DVT (deep venous thrombosis)     in eye, and leg  . Osteoporosis   . Ulcer   . Hernia   . Deaf     Congenital; Requires an interpreter  . Uterine polyp   . Hiatal hernia   . Sleep apnea   . Clotting disorder   . Thyroid disease   . Cataract    Past Surgical History  Procedure Laterality Date  .  Cataract extraction  both eyes  . Goiter removed      age 47  . Xanthoma tumor removed    . Refractive surgery    . Dilatation & currettage/hysteroscopy with resectocope N/A 10/11/2012    Procedure: DILATATION & CURETTAGE/HYSTEROSCOPY WITH RESECTOCOPE;  Surgeon: Allena Katz, MD;  Location: [facility];  Service: Gynecology;  Laterality: N/A;  pt is deaf; please contact daughter Heide Spark [phone]  . Eye surgery    . Hernia repair    . Tonsillectomy    . Hiatal hernia repair N/A 12/08/2013    Procedure: LAPAROSCOPIC REPAIR OF HIATAL HERNIA  ;  Surgeon: [name], MD;  Location: [facility];  Service: General;  Laterality: N/A;   Allergies  Allergen Reactions  . Avastin [Bevacizumab] Nausea And Vomiting  . Clindamycin/Lincomycin Itching  . Novocain [Procaine] Other (See Comments)    seizures  . Sulfa Antibiotics Other (See Comments)    fever   Current Outpatient Prescriptions  Medication Sig Dispense Refill  . alendronate (FOSAMAX) 70 MG tablet Take 1 tablet (70 mg total) by mouth once a week. Take with a full glass of water on an empty stomach. Take one pill each Saturday morning, 30 minutes before meal, avoid lying down for 30 minutes after taking. 13 tablet 3  . aspirin 81 MG tablet Take 81 mg by mouth every morning.     . cloNIDine (CATAPRES) 0.1 MG tablet TAKE 1 TABLET BY MOUTH TWICE A DAY 60 tablet 2  . dorzolamide-timolol (COSOPT) 22.3-6.8 MG/ML ophthalmic solution Place 1 drop into the right eye 2 (two) times daily.    . furosemide (LASIX) 20 MG tablet TAKE 1-2 TABLET (20 MG TOTAL) BY MOUTH EVERY MORNING. 60 tablet 5  . meloxicam (MOBIC) 15 MG tablet TAKE 1 TABLET (15 MG TOTAL) BY MOUTH DAILY. 30 tablet 2  . metoprolol (LOPRESSOR) 50 MG tablet TAKE 1 TABLET TWICE A DAY 60 tablet 0  . omeprazole (PRILOSEC) 20 MG capsule Take 20 mg by mouth 2 (two) times daily before a meal.    . ramipril (ALTACE) 10 MG capsule TAKE ONE CAPSULE BY MOUTH EVERY DAY 30 capsule 5  .  simvastatin (ZOCOR) 40 MG tablet Take 1 tablet (40 mg total) by mouth at bedtime. Take 1 tablet after dinner. 90 tablet 1  . tolterodine (DETROL) 2 MG tablet Take 1 tablet (2 mg total) by mouth 2 (two) times daily. 60 tablet 11  . zolpidem (AMBIEN) 10 MG tablet Take one at bedtime as needed for sleep. 30 tablet 5   No current facility-administered medications for this visit.   History   Social History  . Marital Status: Married    Spouse Name: N/A  . Number of Children: 3  . Years of Education: Grad    Occupational History  . Teacher- Retired    Social History Main Topics  . Smoking status: Never Smoker   . Smokeless tobacco: Never Used  . Alcohol Use: No     Comment: occasionally  . Drug Use: No  . Sexual Activity: Not Currently   Other Topics Concern  . Not on file   Social History Narrative   Marital status: married x 1999; second marriage; first husband died from AMI in 75;  husband is deaf as well.  Moderately happy; no abuse.      Children: 3 children; 2 grandchildren.  2 gg      Lives: with husband and daughter.  Moved from [place]; still has home in [place] but  plans to remain in [place].      Employment: retired; Hospital doctor.      Tobacco:  Never      Alcohol: none      Exercising:  Walking and cleaning the house.      ADLs:  Uses cane for ambulation; has walker; no cooking; cleans house on own; pays own bills; goes to grocery store with husband. Performs self hygiene.        Advanced Directives:  None.  FULL CODE desired; no prolonged measures.   Family History  Problem Relation Age of Onset  . Esophageal cancer Neg Hx   . Rectal cancer Neg Hx   . Stomach cancer Neg Hx   . Cancer Mother 4    colon cancer  . Stroke Mother 75    cause of death  . Heart disease Mother   . Hypertension Mother   . Heart disease Father 15    AMI; cause of death  . Heart disease Brother     AMI x 2; tobacco abuse; CABG  . Hernia Brother   . Hyperlipidemia Brother   .  Hypertension Brother   . Migraines Daughter   . Heart disease Paternal Grandmother        Objective:    BP 144/82 mmHg  Pulse 62  Temp(Src) 98 F (36.7 C) (Oral)  Resp 16  Ht 5' 3.75" (1.619 m)  Wt 207 lb 3.2 oz (93.985 kg)  BMI 35.86 kg/m2  SpO2 97% Physical Exam  Constitutional: She is oriented to person, place, and time. She appears well-developed and well-nourished. No distress.  obese  HENT:  Head: Normocephalic and atraumatic.  Right Ear: External ear normal.  Left Ear: External ear normal.  Nose: Nose normal.  Mouth/Throat: Oropharynx is clear and moist.  Eyes: Conjunctivae and EOM are normal. Pupils are equal, round, and reactive to light.  Neck: Normal range of motion and full passive range of motion without pain. Neck supple. No JVD present. Carotid bruit is not present. No thyromegaly present.  Cardiovascular: Normal rate, regular rhythm and normal heart sounds.  Exam reveals no gallop and no friction rub.   No murmur heard. 3+ pitting and non-pitting edema BLE to mid-calves.  Pulmonary/Chest: Effort normal and breath sounds normal. She has no wheezes. She has no rales.  Abdominal: Soft. Bowel sounds are normal. She exhibits no distension and no mass. There is no tenderness. There is no rebound and no guarding.  Musculoskeletal:       Right shoulder: Normal.       Left shoulder: Normal.       Cervical back: Normal.  Lymphadenopathy:    She has no cervical adenopathy.  Neurological: She is alert and oriented to person, place, and time. She has normal reflexes. No cranial nerve deficit. She exhibits normal muscle tone. Coordination normal.  Skin: Skin is warm and dry. No rash noted. She is not diaphoretic. No erythema. No pallor.  Diffuse sun related changes throughout.  Psychiatric: She has a normal mood and affect. Her behavior is normal. Judgment and thought content normal.  Nursing note and vitals reviewed.       Assessment & Plan:   1. Encounter for  Medicare annual wellness exam   2. Urge incontinence of urine   3. Primary osteoarthritis of left knee   4. Obstructive sleep apnea   5. Insomnia   6. Essential hypertension   7. Edema   8. Deafness, bilateral   9. Sleep disturbance  10. Pure hypercholesterolemia     1.  Annual Wellness Exam subsequent Medicare: anticipatory guidance --- exercise, weight loss, ASA 81 mg daily, 3 servings of dairy daily.  Recommend establishing advanced directives.  Moderate fall risk.  Congenital deafness.  No evidence of depression. Independent with ADLs. 2.  Urge incontinence: chronic; poorly controlled; intolerant to bladder agents due to dry mouth; followed by urology but does not desire follow-up. 3.  OA L knee: stable; taking Meloxicam PRN. 4.  OSA: uncontrolled; refuses CPAP. 5.  Insomnia: uncontrolled on Ambien 5 mg qhs; had long discussion with patient via interpreter regarding high risk nature of Ambien 10mg  qhs and pt desires to proceed with higher dose of medication despite high risk nature of rx.  Refill of Ambien 10mg  qhs provided. 6.  HTN: controlled; continue current medications. 7. Edema BLE: chronic; awaiting compression stockings from medical supply store; s/p consultation by Dr. Renaldo Reel. 8. Congenital deafness: stable. 9. Hypercholesterolemia: controlled; recent labs reviewed with patient during visit; refill provided. 10. BMI 35: recommend weight loss, exercise, low-fat and low-caloric food choices.  Meds ordered this encounter  Medications  . zolpidem (AMBIEN) 10 MG tablet    Sig: Take one at bedtime as needed for sleep.    Dispense:  30 tablet    Refill:  5    Not to exceed 5 additional fills before 10/13/2015PT NEEDS REFILLS.    Return in about 6 months (around 07/02/2015) for recheck cholesterol, blood pressure.     Katheleen Stella Elayne Guerin, M.D. [facility] [address] [place], [place]  [id number] [phone] phone [phone]  fax

## 2015-01-02 ENCOUNTER — Other Ambulatory Visit: Payer: Self-pay | Admitting: Family Medicine

## 2015-01-02 ENCOUNTER — Other Ambulatory Visit: Payer: Self-pay | Admitting: Physician Assistant

## 2015-01-12 ENCOUNTER — Ambulatory Visit (INDEPENDENT_AMBULATORY_CARE_PROVIDER_SITE_OTHER): Payer: Medicare Other | Admitting: Family Medicine

## 2015-01-12 ENCOUNTER — Encounter: Payer: Self-pay | Admitting: Family Medicine

## 2015-01-12 VITALS — BP 110/70 | HR 50 | Temp 98.2°F | Ht 63.5 in | Wt 207.5 lb

## 2015-01-12 DIAGNOSIS — H9193 Unspecified hearing loss, bilateral: Secondary | ICD-10-CM

## 2015-01-12 DIAGNOSIS — R110 Nausea: Secondary | ICD-10-CM

## 2015-01-12 DIAGNOSIS — I10 Essential (primary) hypertension: Secondary | ICD-10-CM | POA: Diagnosis not present

## 2015-01-12 DIAGNOSIS — R55 Syncope and collapse: Secondary | ICD-10-CM

## 2015-01-12 DIAGNOSIS — R197 Diarrhea, unspecified: Secondary | ICD-10-CM

## 2015-01-12 DIAGNOSIS — R001 Bradycardia, unspecified: Secondary | ICD-10-CM

## 2015-01-12 DIAGNOSIS — R609 Edema, unspecified: Secondary | ICD-10-CM | POA: Diagnosis not present

## 2015-01-12 LAB — POCT CBC
Granulocyte percent: 75.8 %G (ref 37–80)
HCT, POC: 37.4 % — AB (ref 37.7–47.9)
Hemoglobin: 12.2 g/dL (ref 12.2–16.2)
LYMPH, POC: 1.2 (ref 0.6–3.4)
MCH, POC: 29.9 pg (ref 27–31.2)
MCHC: 32.7 g/dL (ref 31.8–35.4)
MCV: 91.3 fL (ref 80–97)
MID (CBC): 0.4 (ref 0–0.9)
MPV: 7.3 fL (ref 0–99.8)
PLATELET COUNT, POC: 177 10*3/uL (ref 142–424)
POC GRANULOCYTE: 4.9 (ref 2–6.9)
POC LYMPH %: 18.7 % (ref 10–50)
POC MID %: 5.5 % (ref 0–12)
RBC: 4.09 M/uL (ref 4.04–5.48)
RDW, POC: 13.9 %
WBC: 6.4 10*3/uL (ref 4.6–10.2)

## 2015-01-12 LAB — GLUCOSE, POCT (MANUAL RESULT ENTRY): POC Glucose: 119 mg/dl — AB (ref 70–99)

## 2015-01-12 MED ORDER — ONDANSETRON 4 MG PO TBDP: 4.0000 mg | ORAL_TABLET | Freq: Three times a day (TID) | ORAL | Status: DC | PRN

## 2015-01-12 NOTE — Progress Notes (Signed)
  Subjective:  Patient ID: Melissa Murphy, female    DOB: [birth year]  Age: 79 y.o. MRN: [id number]  79 year old lady who comes in here with a history of having had a syncopal episode at [facility]. Apparently she went to the bathroom after going in the store for some refills. She had diarrhea. She felt fine when she left home. When she was on the toilet she had a fainting spell. She did not fall off the toilet. She says she leaned backwards. When she woke up she got up and went and wash her face and clean herself up and felt better. She went out to the cashier. She needed to move her bowels again and went back into the bathroom. She did not pass out then, but she felt lightheaded. She then came on over here to the office. She communicated with me through nodes.  Chart and past, family, social history reviewed  Review of systems HEENT: Unremarkable. Cardio vascular: Unremarkable Respiratory: Unremarkable GI: As above. Had some abdominal pain which he had the diarrhea but not now. She feels better now. GU unremarkable Neurologic dizziness episode and possible syncopal episode, have resolved.   Objective:   Pleasant elderly lady in no acute distress this time. TMs normal. Throat clear. Neck supple without nodes. No carotid bruits. Chest is clear to auscultation. Heart regular without murmurs. Heart sounds are faint. Pulse is slow. Abdomen has active bowel sounds. Soft without mass or tenderness. 1+ edema.   EKG shows sinus bradycardia  Results for orders placed or performed in visit on 01/12/15  POCT CBC  Result Value Ref Range   WBC 6.4 4.6 - 10.2 K/uL   Lymph, poc 1.2 0.6 - 3.4   POC LYMPH PERCENT 18.7 10 - 50 %L   MID (cbc) 0.4 0 - 0.9   POC MID % 5.5 0 - 12 %M   POC Granulocyte 4.9 2 - 6.9   Granulocyte percent 75.8 37 - 80 %G   RBC 4.09 4.04 - 5.48 M/uL   Hemoglobin 12.2 12.2 - 16.2 g/dL   HCT, POC 37.4 (A) 37.7 - 47.9 %   MCV 91.3 80 - 97 fL   MCH, POC 29.9 27 - 31.2 pg   MCHC 32.7  31.8 - 35.4 g/dL   RDW, POC 13.9 %   Platelet Count, POC 177 142 - 424 K/uL   MPV 7.3 0 - 99.8 fL  POCT glucose (manual entry)  Result Value Ref Range   POC Glucose 119 (A) 70 - 99 mg/dl    Assessment & Plan:   Assessment: Diarrhea Syncope, probably vasovagal Bradycardia (this is not new for her, has been slow often.  Plan: EKG, CBC, glucose  Will treat symptomatically. This appears to have been a vasovagal episode caused by the diarrhea Patient Instructions  Everything looks okay. This appears to have been a fainting episode caused by the blood pressure dropping when he went to the bathroom. This is a fairly common cause of what we call "vasovagal syncope" which is a big name for fainting.  If you get nauseated you can take the Zofran (ondansetron) one every 6 or 8 hours 4 mg.  If you have more diarrhea you can take some Pepto-Bismol or Imodium. The Imodium probably will help stop the rumbling better.  I do not believe that other testing is necessary at this time.     HOPPER,DAVID, MD 01/12/2015

## 2015-01-12 NOTE — Patient Instructions (Signed)
Everything looks okay. This appears to have been a fainting episode caused by the blood pressure dropping when he went to the bathroom. This is a fairly common cause of what we call "vasovagal syncope" which is a big name for fainting.  If you get nauseated you can take the Zofran (ondansetron) one every 6 or 8 hours 4 mg.  If you have more diarrhea you can take some Pepto-Bismol or Imodium. The Imodium probably will help stop the rumbling better.  I do not believe that other testing is necessary at this time.

## 2015-01-19 ENCOUNTER — Telehealth: Payer: Self-pay

## 2015-01-19 NOTE — Telephone Encounter (Signed)
Pt is calling through an interpreter service. She would like a new prescription for omeprazole (PRILOSEC) 20 MG capsule [[id number]] . Please advise at [phone]

## 2015-01-20 NOTE — Telephone Encounter (Signed)
As far as I can tell, we have never prescribed this for her. Until she is evaluated for her GERD in office we can't prescribe omeprazole. Prilosec 20 mg is available to purchase OTC. Please advise pt to only use prilosec prn as it can reduce uptake of vitamins like calcium and vit D and she should use caution since she has osteoporosis.

## 2015-01-20 NOTE — Telephone Encounter (Signed)
Can we refill? It does not look like we prescribed this.

## 2015-01-21 ENCOUNTER — Telehealth: Payer: Self-pay

## 2015-01-21 NOTE — Telephone Encounter (Signed)
Patient is calling through interpreter service to request a refill for voltran.

## 2015-01-22 NOTE — Telephone Encounter (Signed)
lmom to cb. 

## 2015-01-23 NOTE — Telephone Encounter (Signed)
Patient returning a call to clinical in regards to her prescription for "Priolsec 20 mg". Per patient she was given this medication by a provider in [place]. She stated she has been using it for years. She stated her chart should show she has GERD and she was just seen in our office last month. She is also requesting "Voltaren cream". Per patient one of her friends told her it is good for arthritis. She wants to use it for her arthritis in her hands. [facility] [address] and her call back number is [phone] (interperter service)

## 2015-01-26 ENCOUNTER — Other Ambulatory Visit: Payer: Self-pay | Admitting: Obstetrics and Gynecology

## 2015-01-26 DIAGNOSIS — Z124 Encounter for screening for malignant neoplasm of cervix: Secondary | ICD-10-CM | POA: Diagnosis not present

## 2015-01-26 DIAGNOSIS — Z6834 Body mass index (BMI) 34.0-34.9, adult: Secondary | ICD-10-CM | POA: Diagnosis not present

## 2015-01-26 DIAGNOSIS — Z1231 Encounter for screening mammogram for malignant neoplasm of breast: Secondary | ICD-10-CM | POA: Diagnosis not present

## 2015-01-26 LAB — HM MAMMOGRAPHY: HM Mammogram: NEGATIVE

## 2015-01-28 LAB — CYTOLOGY - PAP

## 2015-02-17 ENCOUNTER — Telehealth: Payer: Self-pay

## 2015-02-17 NOTE — Telephone Encounter (Signed)
The relay station called to say pt requesting a refill on OMEPRAZOLE 20 MG. Would like a 90 day supply. Please call

## 2015-03-01 ENCOUNTER — Other Ambulatory Visit: Payer: Self-pay | Admitting: Family Medicine

## 2015-03-31 ENCOUNTER — Ambulatory Visit (INDEPENDENT_AMBULATORY_CARE_PROVIDER_SITE_OTHER): Payer: Medicare Other | Admitting: Family Medicine

## 2015-03-31 ENCOUNTER — Encounter: Payer: Self-pay | Admitting: Family Medicine

## 2015-03-31 ENCOUNTER — Ambulatory Visit (INDEPENDENT_AMBULATORY_CARE_PROVIDER_SITE_OTHER): Payer: Medicare Other

## 2015-03-31 VITALS — BP 174/98 | HR 65 | Temp 98.7°F | Resp 16 | Ht 64.0 in | Wt 204.4 lb

## 2015-03-31 DIAGNOSIS — H9193 Unspecified hearing loss, bilateral: Secondary | ICD-10-CM

## 2015-03-31 DIAGNOSIS — R239 Unspecified skin changes: Secondary | ICD-10-CM

## 2015-03-31 DIAGNOSIS — K449 Diaphragmatic hernia without obstruction or gangrene: Secondary | ICD-10-CM | POA: Diagnosis not present

## 2015-03-31 DIAGNOSIS — I10 Essential (primary) hypertension: Secondary | ICD-10-CM | POA: Diagnosis not present

## 2015-03-31 DIAGNOSIS — M25551 Pain in right hip: Secondary | ICD-10-CM

## 2015-03-31 DIAGNOSIS — R001 Bradycardia, unspecified: Secondary | ICD-10-CM | POA: Diagnosis not present

## 2015-03-31 DIAGNOSIS — K219 Gastro-esophageal reflux disease without esophagitis: Secondary | ICD-10-CM

## 2015-03-31 DIAGNOSIS — G4700 Insomnia, unspecified: Secondary | ICD-10-CM

## 2015-03-31 DIAGNOSIS — M1712 Unilateral primary osteoarthritis, left knee: Secondary | ICD-10-CM

## 2015-03-31 DIAGNOSIS — R234 Changes in skin texture: Secondary | ICD-10-CM

## 2015-03-31 LAB — POCT URINALYSIS DIPSTICK
Bilirubin, UA: NEGATIVE
GLUCOSE UA: NEGATIVE
Ketones, UA: NEGATIVE
Leukocytes, UA: NEGATIVE
NITRITE UA: NEGATIVE
Protein, UA: NEGATIVE
Spec Grav, UA: 1.02
UROBILINOGEN UA: 0.2
pH, UA: 5

## 2015-03-31 MED ORDER — RANITIDINE HCL 150 MG PO TABS: 150.0000 mg | ORAL_TABLET | Freq: Two times a day (BID) | ORAL | Status: DC

## 2015-03-31 MED ORDER — DICLOFENAC SODIUM 1 % TD GEL: 2.0000 g | Freq: Four times a day (QID) | TRANSDERMAL | Status: DC

## 2015-03-31 NOTE — Progress Notes (Signed)
Subjective:    Patient ID: Melissa Murphy, female    DOB: 12-04-1935, 79 y.o.   MRN: [id number]  03/31/2015  Medication Refill   HPI This 79 y.o. female presents for three month follow-up:   1. HTN: Patient reports good compliance with medication, good tolerance to medication, and good symptom control.    2.  GERD/HH: requesting refill of Omeprazole 20mg  daily. Husband worried about risk of kidney failure.  Only using Omeprazole sparingly now; was previously needing more in [place]; eating much less in .  Will take for intermittent heartburn.   3.  OA Knees and hands:  Called in requesting Voltaren gel for hand OA.  Friend had recommended it.    4.  Hypercholesterolemia: Patient reports good compliance with medication, good tolerance to medication, and good symptom control.  Appointment 06/2015 for six month follow-up.  5. Insomnia: Patient reports good compliance with medication, good tolerance to medication, and good symptom control.  Understands the risk of Ambien use; has been taking for years and feels very comfortable continuing use despite increased risk in the elderly.   6.  R leg cramping/pain: worse than usual.  No back pain with it.  Did have mild back pain yesterday.  Had to clean yesterday with increased activity.  Onset a long time. R knee pain is chronic.  No pain with laying on R side; pain with getting up.  No n/t/burning in R leg lateral.  Walking around helps.  Taking Meloxicam PRN.   Prolonged walking worsens.    Review of Systems  Constitutional: Negative for fever, chills, diaphoresis and fatigue.  Eyes: Negative for visual disturbance.  Respiratory: Negative for cough and shortness of breath.   Cardiovascular: Negative for chest pain, palpitations and leg swelling.  Gastrointestinal: Negative for nausea, vomiting, abdominal pain, diarrhea and constipation.  Endocrine: Negative for cold intolerance, heat intolerance, polydipsia, polyphagia and polyuria.    Musculoskeletal: Positive for myalgias, arthralgias and gait problem. Negative for back pain.  Skin: Negative for rash.  Neurological: Negative for dizziness, tremors, seizures, syncope, facial asymmetry, speech difficulty, weakness, light-headedness, numbness and headaches.    Past Medical History  Diagnosis Date  . GERD (gastroesophageal reflux disease)   . Hypertension   . Hyperlipidemia   . Glaucoma   . Urinary incontinence   . Edema   . Anemia   . Arthritis   . DVT (deep venous thrombosis)     in eye, and leg  . Osteoporosis   . Ulcer   . Hernia   . Deaf     Congenital; Requires an interpreter  . Uterine polyp   . Hiatal hernia   . Sleep apnea   . Clotting disorder   . Thyroid disease   . Cataract    Past Surgical History  Procedure Laterality Date  . Cataract extraction      both eyes  . Goiter removed      age 43  . Xanthoma tumor removed    . Refractive surgery    . Dilatation & currettage/hysteroscopy with resectocope N/A 10/11/2012    Procedure: DILATATION & CURETTAGE/HYSTEROSCOPY WITH RESECTOCOPE;  Surgeon: Allena Katz, MD;  Location: [facility];  Service: Gynecology;  Laterality: N/A;  pt is deaf; please contact daughter Heide Spark [phone]  . Eye surgery    . Hernia repair    . Tonsillectomy    . Hiatal hernia repair N/A 12/08/2013    Procedure: LAPAROSCOPIC REPAIR OF HIATAL HERNIA  ;  Surgeon: Renelda Loma  Alyssa Grove, MD;  Location: [facility];  Service: General;  Laterality: N/A;   Allergies  Allergen Reactions  . Avastin [Bevacizumab] Nausea And Vomiting  . Clindamycin/Lincomycin Itching  . Novocain [Procaine] Other (See Comments)    seizures  . Sulfa Antibiotics Other (See Comments)    fever   Social History   Social History  . Marital Status: Married    Spouse Name: N/A  . Number of Children: 3  . Years of Education: Grad    Occupational History  . Teacher- Retired    Social History Main Topics  . Smoking status: Never Smoker   .  Smokeless tobacco: Never Used  . Alcohol Use: No     Comment: occasionally  . Drug Use: No  . Sexual Activity: Not Currently   Other Topics Concern  . Not on file   Social History Narrative   Marital status: married x 1999; second marriage; first husband died from AMI in 43;  husband is deaf as well.  Moderately happy; no abuse.      Children: 3 children; 2 grandchildren.  2 gg      Lives: with husband and daughter.  Moved from [place]; still has home in [place] but plans to remain in [place].      Employment: retired; Hospital doctor.      Tobacco:  Never      Alcohol: none      Exercising:  Walking and cleaning the house.      ADLs:  Uses cane for ambulation; has walker; no cooking; cleans house on own; pays own bills; goes to grocery store with husband. Performs self hygiene.        Advanced Directives:  None.  FULL CODE desired; no prolonged measures.   Family History  Problem Relation Age of Onset  . Esophageal cancer Neg Hx   . Rectal cancer Neg Hx   . Stomach cancer Neg Hx   . Cancer Mother 62    colon cancer  . Stroke Mother 53    cause of death  . Heart disease Mother   . Hypertension Mother   . Heart disease Father 53    AMI; cause of death  . Heart disease Brother     AMI x 2; tobacco abuse; CABG  . Hernia Brother   . Hyperlipidemia Brother   . Hypertension Brother   . Migraines Daughter   . Heart disease Paternal Grandmother         Objective:    BP 174/98 mmHg  Pulse 65  Temp(Src) 98.7 F (37.1 C) (Oral)  Resp 16  Ht 5\' 4"  (1.626 m)  Wt 204 lb 6.4 oz (92.715 kg)  BMI 35.07 kg/m2  SpO2 98% Physical Exam  Constitutional: She is oriented to person, place, and time. She appears well-developed and well-nourished. No distress.  HENT:  Head: Normocephalic and atraumatic.  Right Ear: External ear normal.  Left Ear: External ear normal.  Nose: Nose normal.  Mouth/Throat: Oropharynx is clear and moist.  Eyes: Conjunctivae and EOM are normal. Pupils  are equal, round, and reactive to light.  Neck: Normal range of motion. Neck supple. Carotid bruit is not present. No thyromegaly present.  Cardiovascular: Normal rate, regular rhythm, normal heart sounds and intact distal pulses.  Exam reveals no gallop and no friction rub.   No murmur heard. 2+ non-pitting edema BLE.  Pulmonary/Chest: Effort normal and breath sounds normal. She has no wheezes. She has no rales.  Abdominal: Soft. Bowel sounds are  normal. She exhibits no distension and no mass. There is no tenderness. There is no rebound and no guarding.  Lymphadenopathy:    She has no cervical adenopathy.  Neurological: She is alert and oriented to person, place, and time. No cranial nerve deficit.  Skin: Skin is warm and dry. No rash noted. She is not diaphoretic. No erythema. No pallor.  Scattered skin lesions most consistent with seborrhea keratoses along neck, torso.  Psychiatric: She has a normal mood and affect. Her behavior is normal.    [facility] reading (PRIMARY) by  Dr. Tamala Julian.  R HIP: arthritic changes present. No acute process.      Assessment & Plan:   1. Primary osteoarthritis of left knee   2. Essential hypertension   3. Insomnia   4. Deafness, bilateral   5. Bradycardia   6. Gastroesophageal reflux disease without esophagitis   7. Hiatal hernia   8. Right hip pain   9. Recent skin changes    1.  OA L knee: Chronic; agreeable to trial of Voltaren gel.  2.  HTN: Uncontrolled today; pt reports that she has not taken her medication today; no changes to management; recommend checking BP daily for one week; call office if remains > 140/90.  Obtain labs. 3.  Insomnia: controlled; no changes in therapy. 4.  GERD: intermittent; rx for Zantac 150mg  bid PRN provided; pt wants to avoid PPIs. 5.  HH: stable; use Zantac PRN. 6.  R hip pain: New; recommend Meloxicam daily for two weeks and then PRN.  Arthritic changes on hip xray.  Recommend rest and avoid prolonged walking. 7.   Recent skin changes:  New.  Refer for dermatology.  Orders Placed This Encounter  Procedures  . DG HIP UNILAT W OR W/O PELVIS 2-3 VIEWS RIGHT    Standing Status: Future     Number of Occurrences: 1     Standing Expiration Date: 03/30/2016    Order Specific Question:  Reason for Exam (SYMPTOM  OR DIAGNOSIS REQUIRED)    Answer:  R hip pain    Order Specific Question:  Preferred imaging location?    Answer:  External  . Comprehensive metabolic panel  . Ambulatory referral to Dermatology    Referral Priority:  Routine    Referral Type:  Consultation    Referral Reason:  Specialty Services Required    Requested Specialty:  Dermatology    Number of Visits Requested:  1  . POCT urinalysis dipstick    Meds ordered this encounter  Medications  . ranitidine (ZANTAC) 150 MG tablet    Sig: Take 1 tablet (150 mg total) by mouth 2 (two) times daily.    Dispense:  60 tablet    Refill:  3  . diclofenac sodium (VOLTAREN) 1 % GEL    Sig: Apply 2 g topically 4 (four) times daily.    Dispense:  100 g    Refill:  5    No Follow-up on file.   Harley Fitzwater Elayne Guerin, M.D. [facility] [address] [place], [place]  [address] [phone] phone [phone] fax

## 2015-03-31 NOTE — Patient Instructions (Addendum)
1. Check blood pressure three times in the upcoming week and call Dr. Tamala Julian with readings.   Hip Exercises RANGE OF MOTION (ROM) AND STRETCHING EXERCISES  These exercises may help you when beginning to rehabilitate your injury. Doing them too aggressively can worsen your condition. Complete them slowly and gently. Your symptoms may resolve with or without further involvement from your physician, physical therapist or athletic trainer. While completing these exercises, remember:   Restoring tissue flexibility helps normal motion to return to the joints. This allows healthier, less painful movement and activity.  An effective stretch should be held for at least 30 seconds.  A stretch should never be painful. You should only feel a gentle lengthening or release in the stretched tissue. If these stretches worsen your symptoms even when done gently, consult your physician, physical therapist or athletic trainer. STRETCH - Hamstrings, Supine   Lie on your back. Loop a belt or towel over the ball of your right / left foot.  Straighten your right / left knee and slowly pull on the belt to raise your leg. Do not allow the right / left knee to bend. Keep your opposite leg flat on the floor.  Raise the leg until you feel a gentle stretch behind your right / left knee or thigh. Hold this position for __________ seconds. Repeat __________ times. Complete this stretch __________ times per day.  STRETCH - Hip Rotators   Lie on your back on a firm surface. Grasp your right / left knee with your right / left hand and your ankle with your opposite hand.  Keeping your hips and shoulders firmly planted, gently pull your right / left knee and rotate your lower leg toward your opposite shoulder until you feel a stretch in your buttocks.  Hold this stretch for __________ seconds. Repeat this stretch __________ times. Complete this stretch __________ times per day. STRETCH - Hamstrings/Adductors, V-Sit   Sit  on the floor with your legs extended in a large "V," keeping your knees straight.  With your head and chest upright, bend at your waist reaching for your right foot to stretch your left adductors.  You should feel a stretch in your left inner thigh. Hold for __________ seconds.  Return to the upright position to relax your leg muscles.  Continuing to keep your chest upright, bend straight forward at your waist to stretch your hamstrings.  You should feel a stretch behind both of your thighs and/or knees. Hold for __________ seconds.  Return to the upright position to relax your leg muscles.  Repeat steps 2 through 4 for opposite leg. Repeat __________ times. Complete this exercise __________ times per day.  STRETCHING - Hip Flexors, Lunge  Half kneel with your right / left knee on the floor and your opposite knee bent and directly over your ankle.  Keep good posture with your head over your shoulders. Tighten your buttocks to point your tailbone downward; this will prevent your back from arching too much.  You should feel a gentle stretch in the front of your thigh and/or hip. If you do not feel any resistance, slightly slide your opposite foot forward and then slowly lunge forward so your knee once again lines up over your ankle. Be sure your tailbone remains pointed downward.  Hold this stretch for __________ seconds. Repeat __________ times. Complete this stretch __________ times per day. STRENGTHENING EXERCISES These exercises may help you when beginning to rehabilitate your injury. They may resolve your symptoms with or without  further involvement from your physician, physical therapist or athletic trainer. While completing these exercises, remember:   Muscles can gain both the endurance and the strength needed for everyday activities through controlled exercises.  Complete these exercises as instructed by your physician, physical therapist or athletic trainer. Progress the  resistance and repetitions only as guided.  You may experience muscle soreness or fatigue, but the pain or discomfort you are trying to eliminate should never worsen during these exercises. If this pain does worsen, stop and make certain you are following the directions exactly. If the pain is still present after adjustments, discontinue the exercise until you can discuss the trouble with your clinician. STRENGTH - Hip Extensors, Bridge   Lie on your back on a firm surface. Bend your knees and place your feet flat on the floor.  Tighten your buttocks muscles and lift your bottom off the floor until your trunk is level with your thighs. You should feel the muscles in your buttocks and back of your thighs working. If you do not feel these muscles, slide your feet 1-2 inches further away from your buttocks.  Hold this position for __________ seconds.  Slowly lower your hips to the starting position and allow your buttock muscles relax completely before beginning the next repetition.  If this exercise is too easy, you may cross your arms over your chest. Repeat __________ times. Complete this exercise __________ times per day.  STRENGTH - Hip Abductors, Straight Leg Raises  Be aware of your form throughout the entire exercise so that you exercise the correct muscles. Sloppy form means that you are not strengthening the correct muscles.  Lie on your side so that your head, shoulders, knee and hip line up. You may bend your lower knee to help maintain your balance. Your right / left leg should be on top.  Roll your hips slightly forward, so that your hips are stacked directly over each other and your right / left knee is facing forward.  Lift your top leg up 4-6 inches, leading with your heel. Be sure that your foot does not drift forward or that your knee does not roll toward the ceiling.  Hold this position for __________ seconds. You should feel the muscles in your outer hip lifting (you may not  notice this until your leg begins to tire).  Slowly lower your leg to the starting position. Allow the muscles to fully relax before beginning the next repetition. Repeat __________ times. Complete this exercise __________ times per day.  STRENGTH - Hip Adductors, Straight Leg Raises   Lie on your side so that your head, shoulders, knee and hip line up. You may place your upper foot in front to help maintain your balance. Your right / left leg should be on the bottom.  Roll your hips slightly forward, so that your hips are stacked directly over each other and your right / left knee is facing forward.  Tense the muscles in your inner thigh and lift your bottom leg 4-6 inches. Hold this position for __________ seconds.  Slowly lower your leg to the starting position. Allow the muscles to fully relax before beginning the next repetition. Repeat __________ times. Complete this exercise __________ times per day.  STRENGTH - Quadriceps, Straight Leg Raises  Quality counts! Watch for signs that the quadriceps muscle is working to insure you are strengthening the correct muscles and not "cheating" by substituting with healthier muscles.  Lay on your back with your right / left leg  extended and your opposite knee bent.  Tense the muscles in the front of your right / left thigh. You should see either your knee cap slide up or increased dimpling just above the knee. Your thigh may even quiver.  Tighten these muscles even more and raise your leg 4 to 6 inches off the floor. Hold for right / left seconds.  Keeping these muscles tense, lower your leg.  Relax the muscles slowly and completely in between each repetition. Repeat __________ times. Complete this exercise __________ times per day.  STRENGTH - Hip Abductors, Standing  Tie one end of a rubber exercise band/tubing to a secure surface (table, pole) and tie a loop at the other end.  Place the loop around your right / left ankle. Keeping your  ankle with the band directly opposite of the secured end, step away until there is tension in the tube/band.  Hold onto a chair as needed for balance.  Keeping your back upright, your shoulders over your hips, and your toes pointing forward, lift your right / left leg out to your side. Be sure to lift your leg with your hip muscles. Do not "throw" your leg or tip your body to lift your leg.  Slowly and with control, return to the starting position. Repeat exercise __________ times. Complete this exercise __________ times per day.  STRENGTH - Quadriceps, Squats  Stand in a door frame so that your feet and knees are in line with the frame.  Use your hands for balance, not support, on the frame.  Slowly lower your weight, bending at the hips and knees. Keep your lower legs upright so that they are parallel with the door frame. Squat only within the range that does not increase your knee pain. Never let your hips drop below your knees.  Slowly return upright, pushing with your legs, not pulling with your hands. Document Released: 08/18/2005 Document Revised: 10/23/2011 Document Reviewed: 11/12/2008 [facility] Patient Information 2015 [place], [place]. This information is not intended to replace advice given to you by your health care provider. Make sure you discuss any questions you have with your health care provider.

## 2015-04-01 ENCOUNTER — Encounter: Payer: Self-pay | Admitting: *Deleted

## 2015-04-01 LAB — COMPREHENSIVE METABOLIC PANEL
ALT: 12 U/L (ref 6–29)
AST: 19 U/L (ref 10–35)
Albumin: 4.1 g/dL (ref 3.6–5.1)
Alkaline Phosphatase: 62 U/L (ref 33–130)
BUN: 15 mg/dL (ref 7–25)
CALCIUM: 9.2 mg/dL (ref 8.6–10.4)
CO2: 27 mmol/L (ref 20–31)
Chloride: 103 mmol/L (ref 98–110)
Creat: 0.71 mg/dL (ref 0.60–0.93)
Glucose, Bld: 75 mg/dL (ref 65–99)
Potassium: 3.9 mmol/L (ref 3.5–5.3)
Sodium: 139 mmol/L (ref 135–146)
Total Bilirubin: 0.3 mg/dL (ref 0.2–1.2)
Total Protein: 6.8 g/dL (ref 6.1–8.1)

## 2015-04-06 ENCOUNTER — Encounter: Payer: Self-pay | Admitting: Family Medicine

## 2015-05-16 ENCOUNTER — Other Ambulatory Visit: Payer: Self-pay | Admitting: Family Medicine

## 2015-05-19 ENCOUNTER — Telehealth: Payer: Self-pay

## 2015-05-19 DIAGNOSIS — G479 Sleep disorder, unspecified: Secondary | ICD-10-CM

## 2015-05-19 NOTE — Telephone Encounter (Signed)
Pt would like a refill on her zolpidem (AMBIEN) 10 MG tablet [[id number]]. She has a sign language interpretor, so feel free to leave a message. Please advise at [phone]

## 2015-05-20 ENCOUNTER — Telehealth: Payer: Self-pay

## 2015-05-20 MED ORDER — ZOLPIDEM TARTRATE 10 MG PO TABS: ORAL_TABLET | ORAL | Status: DC

## 2015-05-20 NOTE — Telephone Encounter (Signed)
Please fax or call in refill of Ambien as approved.

## 2015-05-20 NOTE — Telephone Encounter (Signed)
PA approved for zolpidem 10 Qhs through 05/19/16. Notified pharm.

## 2015-05-20 NOTE — Telephone Encounter (Signed)
This was done and PA also done and approved. See next message.

## 2015-06-25 ENCOUNTER — Ambulatory Visit: Payer: Medicare Other | Admitting: Family Medicine

## 2015-06-28 ENCOUNTER — Ambulatory Visit (INDEPENDENT_AMBULATORY_CARE_PROVIDER_SITE_OTHER): Payer: Medicare Other | Admitting: Family Medicine

## 2015-06-28 ENCOUNTER — Other Ambulatory Visit: Payer: Self-pay | Admitting: Family Medicine

## 2015-06-28 ENCOUNTER — Encounter: Payer: Self-pay | Admitting: Family Medicine

## 2015-06-28 VITALS — BP 187/81 | HR 53 | Temp 98.0°F | Resp 16 | Ht 65.0 in | Wt 197.8 lb

## 2015-06-28 DIAGNOSIS — M1712 Unilateral primary osteoarthritis, left knee: Secondary | ICD-10-CM | POA: Diagnosis not present

## 2015-06-28 DIAGNOSIS — Z23 Encounter for immunization: Secondary | ICD-10-CM

## 2015-06-28 DIAGNOSIS — I10 Essential (primary) hypertension: Secondary | ICD-10-CM | POA: Diagnosis not present

## 2015-06-28 DIAGNOSIS — E7800 Pure hypercholesterolemia, unspecified: Secondary | ICD-10-CM

## 2015-06-28 DIAGNOSIS — K219 Gastro-esophageal reflux disease without esophagitis: Secondary | ICD-10-CM

## 2015-06-28 DIAGNOSIS — G4733 Obstructive sleep apnea (adult) (pediatric): Secondary | ICD-10-CM | POA: Diagnosis not present

## 2015-06-28 DIAGNOSIS — G4700 Insomnia, unspecified: Secondary | ICD-10-CM | POA: Diagnosis not present

## 2015-06-28 DIAGNOSIS — H9193 Unspecified hearing loss, bilateral: Secondary | ICD-10-CM

## 2015-06-28 DIAGNOSIS — N3941 Urge incontinence: Secondary | ICD-10-CM

## 2015-06-28 LAB — COMPREHENSIVE METABOLIC PANEL
ALK PHOS: 48 U/L (ref 33–130)
ALT: 15 U/L (ref 6–29)
AST: 25 U/L (ref 10–35)
Albumin: 3.9 g/dL (ref 3.6–5.1)
BUN: 15 mg/dL (ref 7–25)
CALCIUM: 9.3 mg/dL (ref 8.6–10.4)
CO2: 28 mmol/L (ref 20–31)
Chloride: 105 mmol/L (ref 98–110)
Creat: 0.72 mg/dL (ref 0.60–0.93)
GLUCOSE: 96 mg/dL (ref 65–99)
POTASSIUM: 3.9 mmol/L (ref 3.5–5.3)
Sodium: 142 mmol/L (ref 135–146)
Total Bilirubin: 0.5 mg/dL (ref 0.2–1.2)
Total Protein: 6.8 g/dL (ref 6.1–8.1)

## 2015-06-28 LAB — CBC WITH DIFFERENTIAL/PLATELET
Basophils Absolute: 0 10*3/uL (ref 0.0–0.1)
Basophils Relative: 0 % (ref 0–1)
EOS PCT: 2 % (ref 0–5)
Eosinophils Absolute: 0.1 10*3/uL (ref 0.0–0.7)
HEMATOCRIT: 38.8 % (ref 36.0–46.0)
Hemoglobin: 12.9 g/dL (ref 12.0–15.0)
LYMPHS ABS: 1.4 10*3/uL (ref 0.7–4.0)
LYMPHS PCT: 36 % (ref 12–46)
MCH: 30.6 pg (ref 26.0–34.0)
MCHC: 33.2 g/dL (ref 30.0–36.0)
MCV: 91.9 fL (ref 78.0–100.0)
MONO ABS: 0.2 10*3/uL (ref 0.1–1.0)
MPV: 10.3 fL (ref 8.6–12.4)
Monocytes Relative: 6 % (ref 3–12)
Neutro Abs: 2.2 10*3/uL (ref 1.7–7.7)
Neutrophils Relative %: 56 % (ref 43–77)
Platelets: 194 10*3/uL (ref 150–400)
RBC: 4.22 MIL/uL (ref 3.87–5.11)
RDW: 13.5 % (ref 11.5–15.5)
WBC: 4 10*3/uL (ref 4.0–10.5)

## 2015-06-28 LAB — LIPID PANEL
CHOL/HDL RATIO: 2.8 ratio (ref <=5.0)
Cholesterol: 136 mg/dL (ref 125–200)
HDL: 48 mg/dL (ref >=46)
LDL CALC: 67 mg/dL (ref <130)
Triglycerides: 105 mg/dL (ref <150)
VLDL: 21 mg/dL (ref <30)

## 2015-06-28 MED ORDER — CLONIDINE HCL 0.1 MG PO TABS: 0.1000 mg | ORAL_TABLET | Freq: Three times a day (TID) | ORAL | Status: DC

## 2015-06-28 MED ORDER — DICLOFENAC SODIUM 1 % TD GEL: 2.0000 g | Freq: Four times a day (QID) | TRANSDERMAL | Status: DC

## 2015-06-28 MED ORDER — AMLODIPINE BESYLATE 2.5 MG PO TABS: 2.5000 mg | ORAL_TABLET | Freq: Every day | ORAL | Status: DC

## 2015-06-28 NOTE — Progress Notes (Signed)
Subjective:    Patient ID: Melissa Murphy, female    DOB: [birth year], 79 y.o.   MRN: [id number]  06/28/2015  Follow-up; Hyperlipidemia; Hypertension; and Osteoarthritis   HPI This 79 y.o. female presents for six month follow-up:   1. HTN: Patient reports good compliance with medication, good tolerance to medication, and good symptom control.  Running 0000000 systolic -- [id number] systolic.  Has been too busy to check.  Ramipril 10mg  once daily. Metoprolol 50mg  one twice daily. Clonidine 0.1mg  bid started Dr. Marin Comment.    2. Hypercholesterolemia: Patient reports good compliance with medication, good tolerance to medication, and good symptom control.    3.  OA knees: Patient reports good compliance with medication, good tolerance to medication, and good symptom control.  Having balance issues due to ongoing knee pain.  No previous ortho evaluation.  Using cane and walker.  Taking Meloxicam without much relief; taking Tylenol at night with improvement.  4. Urinary incontinence:  Chronic daily issue; wears depends.  Very frustrating; s/p urology consultation; medication recommended but pt declined.  5.  Lower extremity edema:  Takes Lasix 20mg  one daily.  Sometimes will need 40mg  daily.  Still swelling but has improved.    6.  GERD: Patient reports good compliance with medication, good tolerance to medication, and good symptom control.  Taking Prilosec 20mg  bid PRN.  Taking PRN only.    7. Constipation: takes stool softener daily; questions if needs another colonoscopy. Taking Miralax once daily PRN; cannot take daily or gets diarrhea.      8. Insomnia: Patient reports good compliance with medication, good tolerance to medication, and good symptom control.  Taking Ambien qhs.    Review of Systems  Constitutional: Negative for fever, chills, diaphoresis and fatigue.  HENT: Positive for hearing loss. Negative for congestion.   Eyes: Negative for visual disturbance.  Respiratory: Negative for cough  and shortness of breath.   Cardiovascular: Negative for chest pain, palpitations and leg swelling.  Gastrointestinal: Positive for constipation. Negative for nausea, vomiting, abdominal pain and diarrhea.  Endocrine: Negative for cold intolerance, heat intolerance, polydipsia, polyphagia and polyuria.  Genitourinary: Positive for urgency and frequency. Negative for dysuria and flank pain.  Musculoskeletal: Positive for arthralgias.  Neurological: Negative for dizziness, tremors, seizures, syncope, facial asymmetry, speech difficulty, weakness, light-headedness, numbness and headaches.  Psychiatric/Behavioral: Positive for sleep disturbance. Negative for suicidal ideas, self-injury and dysphoric mood. The patient is not nervous/anxious.     Past Medical History  Diagnosis Date  . GERD (gastroesophageal reflux disease)   . Hypertension   . Hyperlipidemia   . Glaucoma   . Urinary incontinence   . Edema   . Anemia   . Arthritis   . DVT (deep venous thrombosis) (HCC)     in eye, and leg  . Osteoporosis   . Ulcer   . Hernia   . Deaf     Congenital; Requires an interpreter  . Uterine polyp   . Hiatal hernia   . Sleep apnea   . Clotting disorder ([place])   . Thyroid disease   . Cataract    Past Surgical History  Procedure Laterality Date  . Cataract extraction      both eyes  . Goiter removed      age 62  . Xanthoma tumor removed    . Refractive surgery    . Dilatation & currettage/hysteroscopy with resectocope N/A 10/11/2012    Procedure: DILATATION & CURETTAGE/HYSTEROSCOPY WITH RESECTOCOPE;  Surgeon: Allena Katz, MD;  Location: [facility];  Service: Gynecology;  Laterality: N/A;  pt is deaf; please contact daughter Heide Spark [phone]  . Eye surgery    . Hernia repair    . Tonsillectomy    . Hiatal hernia repair N/A 12/08/2013    Procedure: LAPAROSCOPIC REPAIR OF HIATAL HERNIA  ;  Surgeon: [name], MD;  Location: [facility];  Service: General;  Laterality: N/A;    Allergies  Allergen Reactions  . Avastin [Bevacizumab] Nausea And Vomiting  . Clindamycin/Lincomycin Itching  . Novocain [Procaine] Other (See Comments)    seizures  . Sulfa Antibiotics Other (See Comments)    fever   Current Outpatient Prescriptions  Medication Sig Dispense Refill  . alendronate (FOSAMAX) 70 MG tablet Take 1 tablet (70 mg total) by mouth once a week. Take with a full glass of water on an empty stomach. 13 tablet 3  . aspirin 81 MG tablet Take 81 mg by mouth every morning.     . cloNIDine (CATAPRES) 0.1 MG tablet Take 1 tablet (0.1 mg total) by mouth 3 (three) times daily. 180 tablet 1  . diclofenac sodium (VOLTAREN) 1 % GEL Apply 2 g topically 4 (four) times daily. 100 g 5  . dorzolamide-timolol (COSOPT) 22.3-6.8 MG/ML ophthalmic solution Place 1 drop into the right eye 2 (two) times daily.    . furosemide (LASIX) 20 MG tablet TAKE 1-2 TABLET (20 MG TOTAL) BY MOUTH EVERY MORNING. 60 tablet 4  . meloxicam (MOBIC) 15 MG tablet TAKE 1 TABLET (15 MG TOTAL) BY MOUTH DAILY. 30 tablet 3  . metoprolol (LOPRESSOR) 50 MG tablet TAKE 1 TABLET TWICE A DAY 180 tablet 1  . omeprazole (PRILOSEC) 20 MG capsule Take 20 mg by mouth 2 (two) times daily before a meal.    . ramipril (ALTACE) 10 MG capsule Take 1 capsule (10 mg total) by mouth daily. 90 capsule 1  . simvastatin (ZOCOR) 40 MG tablet Take 1 tablet (40 mg total) by mouth at bedtime. 90 tablet 1  . tolterodine (DETROL) 2 MG tablet Take 1 tablet (2 mg total) by mouth 2 (two) times daily. 60 tablet 11  . zolpidem (AMBIEN) 10 MG tablet Take one at bedtime as needed for sleep. 30 tablet 5  . amLODipine (NORVASC) 2.5 MG tablet Take 1 tablet (2.5 mg total) by mouth daily. 90 tablet 3  . ondansetron (ZOFRAN ODT) 4 MG disintegrating tablet Take 1 tablet (4 mg total) by mouth every 8 (eight) hours as needed for nausea or vomiting. (Patient not taking: Reported on 06/28/2015) 10 tablet 0  . ranitidine (ZANTAC) 150 MG tablet Take 1 tablet  (150 mg total) by mouth 2 (two) times daily. (Patient not taking: Reported on 06/28/2015) 60 tablet 3   No current facility-administered medications for this visit.   Social History   Social History  . Marital Status: Married    Spouse Name: N/A  . Number of Children: 3  . Years of Education: Grad    Occupational History  . Teacher- Retired    Social History Main Topics  . Smoking status: Never Smoker   . Smokeless tobacco: Never Used  . Alcohol Use: No     Comment: occasionally  . Drug Use: No  . Sexual Activity: Not Currently   Other Topics Concern  . Not on file   Social History Narrative   Marital status: married x 1999; second marriage; first husband died from AMI in 31;  husband is deaf as well.  Moderately happy;  no abuse.      Children: 3 children; 2 grandchildren.  2 gg      Lives: with husband and daughter.  Moved from [place]; still has home in [place] but plans to remain in [place].      Employment: retired; Hospital doctor.      Tobacco:  Never      Alcohol: none      Exercising:  Walking and cleaning the house.      ADLs:  Uses cane for ambulation; has walker; no cooking; cleans house on own; pays own bills; goes to grocery store with husband. Performs self hygiene.        Advanced Directives:  None.  FULL CODE desired; no prolonged measures.   Family History  Problem Relation Age of Onset  . Esophageal cancer Neg Hx   . Rectal cancer Neg Hx   . Stomach cancer Neg Hx   . Cancer Mother 66    colon cancer  . Stroke Mother 75    cause of death  . Heart disease Mother   . Hypertension Mother   . Heart disease Father 72    AMI; cause of death  . Heart disease Brother     AMI x 2; tobacco abuse; CABG  . Hernia Brother   . Hyperlipidemia Brother   . Hypertension Brother   . Migraines Daughter   . Heart disease Paternal Grandmother        Objective:    BP 187/81 mmHg  Pulse 53  Temp(Src) 98 F (36.7 C) (Oral)  Resp 16  Ht 5\' 5"  (1.651 m)  Wt  197 lb 12.8 oz (89.721 kg)  BMI 32.92 kg/m2 Physical Exam  Constitutional: She is oriented to person, place, and time. She appears well-developed and well-nourished. No distress.  HENT:  Head: Normocephalic and atraumatic.  Right Ear: External ear normal.  Left Ear: External ear normal.  Nose: Nose normal.  Mouth/Throat: Oropharynx is clear and moist.  Eyes: Conjunctivae and EOM are normal. Pupils are equal, round, and reactive to light.  Neck: Normal range of motion. Neck supple. Carotid bruit is not present. No thyromegaly present.  Cardiovascular: Normal rate, regular rhythm, normal heart sounds and intact distal pulses.  Exam reveals no gallop and no friction rub.   No murmur heard. 2+ pitting and non-pitting edema BLE.  Pulmonary/Chest: Effort normal and breath sounds normal. She has no wheezes. She has no rales.  Abdominal: Soft. Bowel sounds are normal. She exhibits no distension and no mass. There is no tenderness. There is no rebound and no guarding.  Musculoskeletal: She exhibits edema.       Right knee: She exhibits decreased range of motion. She exhibits no swelling, no effusion, no ecchymosis and no deformity. Tenderness found. Medial joint line and lateral joint line tenderness noted.  Lymphadenopathy:    She has no cervical adenopathy.  Neurological: She is alert and oriented to person, place, and time. No cranial nerve deficit.  Skin: Skin is warm and dry. No rash noted. She is not diaphoretic. No erythema. No pallor.  Psychiatric: She has a normal mood and affect. Her behavior is normal.        Assessment & Plan:   1. Essential hypertension   2. Urge incontinence of urine   3. Primary osteoarthritis of left knee   4. Insomnia   5. Deafness, bilateral   6. Obstructive sleep apnea   7. Pure hypercholesterolemia   8. Gastroesophageal reflux disease without esophagitis  9. Need for prophylactic vaccination and inoculation against influenza     1. HTN:  uncontrolled; increase Clonidine to 0.1mg  tid; add Amlodipine 2.5mg  daily; continue Metoprolol and Rampiril at current doses; obtain labs; RTC one month for recheck. 2.  Urge incontinence: chronic; uncontrolled; pt declines medication; wears Depends. 3.  OA B knees: chronic with worsening pain; never received Voltaren gel; will send in again; Mobic not very effective; Tylenol provides some pain relief; continue Tylenol PRN. 4.  Insomnia: controlled; with Ambien; pt aware of risks of sleep medication with age. 5.  B deafness congenital: chronic; interpreter present for entire visit. 6.  Hypercholesterolemia: controlled; obtain labs; with addition of Amlodipine, will decrease dose of Simvastatin or switch to other statin. 7.  GERD: stable; using PPI intermittently. 8. S/p flu vaccine.   Orders Placed This Encounter  Procedures  . Flu Vaccine QUAD 36+ mos IM  . CBC with Differential/Platelet  . Comprehensive metabolic panel    Order Specific Question:  Has the patient fasted?    Answer:  Yes  . Lipid panel    Order Specific Question:  Has the patient fasted?    Answer:  Yes   Meds ordered this encounter  Medications  . cloNIDine (CATAPRES) 0.1 MG tablet    Sig: Take 1 tablet (0.1 mg total) by mouth 3 (three) times daily.    Dispense:  180 tablet    Refill:  1  . diclofenac sodium (VOLTAREN) 1 % GEL    Sig: Apply 2 g topically 4 (four) times daily.    Dispense:  100 g    Refill:  5  . amLODipine (NORVASC) 2.5 MG tablet    Sig: Take 1 tablet (2.5 mg total) by mouth daily.    Dispense:  90 tablet    Refill:  3    Return in about 4 weeks (around 07/26/2015) for recheck.    Odette Watanabe Elayne Guerin, M.D. [facility] [address] [place], [place]  [address] [phone] phone [phone] fax

## 2015-06-29 ENCOUNTER — Telehealth: Payer: Self-pay

## 2015-06-29 NOTE — Telephone Encounter (Signed)
Dr. Tamala Julian can you help them out with this letter?

## 2015-06-29 NOTE — Addendum Note (Signed)
Addended by: Wardell Honour on: 06/29/2015 10:15 AM   Modules accepted: Orders, Medications

## 2015-06-29 NOTE — Telephone Encounter (Signed)
Dr Tamala Julian  Patient is requesting a letter to the [facility], for the [facility] mail to be delivered to their door.   They are old, weak knees and trouble walking.    Ronnie L. [address], postmaster  [place],  [place], [place]  [address]  Patients address is:    Pam and Latisha Welton  [address]  [place], [place]  [address]

## 2015-07-06 NOTE — Telephone Encounter (Signed)
Patient left message on disabilities VM about this letter. Can you call the patient when it is completed?   [phone]

## 2015-07-18 ENCOUNTER — Other Ambulatory Visit: Payer: Self-pay | Admitting: Family Medicine

## 2015-07-18 ENCOUNTER — Encounter: Payer: Self-pay | Admitting: Family Medicine

## 2015-07-23 ENCOUNTER — Other Ambulatory Visit: Payer: Self-pay | Admitting: Family Medicine

## 2015-07-26 ENCOUNTER — Ambulatory Visit: Payer: Medicare Other | Admitting: Family Medicine

## 2015-07-28 NOTE — Telephone Encounter (Signed)
Patient called again today asking if letter to postmaster has been written. I didn't see a letter. Dr. Tamala Julian, can you write letter for patient asking if mail/boxes can be left at the door? Please call when ready. Patient says she will pick up. [phone].

## 2015-08-03 ENCOUNTER — Telehealth: Payer: Self-pay | Admitting: Family Medicine

## 2015-08-03 NOTE — Telephone Encounter (Signed)
Dr. Tamala Julian,  Please see the previous phone exchanges about this patient needing a letter written to the [facility]. She left a message on the disabilities VM again. She called in November to request this letter.   [phone]

## 2015-08-07 ENCOUNTER — Other Ambulatory Visit: Payer: Self-pay | Admitting: Family Medicine

## 2015-08-14 ENCOUNTER — Telehealth: Payer: Self-pay

## 2015-08-14 DIAGNOSIS — I10 Essential (primary) hypertension: Secondary | ICD-10-CM

## 2015-08-14 NOTE — Telephone Encounter (Signed)
Pt needs her BP pills filled.  Says Dr. Tamala Julian denied refill until she comes in for an appointment.  Pt has appointment on 08-23-15.  Says she takes 2 pills per day and doesn't think she can wait until the 9th on these.  Wants to use [facility].  BE ADVISED!! this patient is deaf and did call us through a translation service.  Call back is [phone].

## 2015-08-16 MED ORDER — CLONIDINE HCL 0.1 MG PO TABS
0.1000 mg | ORAL_TABLET | Freq: Three times a day (TID) | ORAL | Status: DC
Start: 2015-08-16 — End: 2015-10-04

## 2015-08-16 NOTE — Telephone Encounter (Signed)
Rx sent in. Left Vm letting pt know.

## 2015-08-23 ENCOUNTER — Ambulatory Visit: Payer: Medicare Other | Admitting: Family Medicine

## 2015-08-24 NOTE — Telephone Encounter (Signed)
Letter mailed to both locations. Pt advised on VM.

## 2015-08-24 NOTE — Telephone Encounter (Signed)
Letter completed.

## 2015-08-24 NOTE — Telephone Encounter (Signed)
Letter has been created. Please mail to postmaster (address is on letter). Please also mail a copy to patient.

## 2015-08-29 ENCOUNTER — Other Ambulatory Visit: Payer: Self-pay | Admitting: Family Medicine

## 2015-10-04 ENCOUNTER — Other Ambulatory Visit: Payer: Self-pay | Admitting: Family Medicine

## 2015-10-05 ENCOUNTER — Other Ambulatory Visit: Payer: Self-pay | Admitting: Family Medicine

## 2015-10-07 ENCOUNTER — Other Ambulatory Visit: Payer: Self-pay

## 2015-11-29 ENCOUNTER — Other Ambulatory Visit: Payer: Self-pay | Admitting: Family Medicine

## 2015-12-06 ENCOUNTER — Other Ambulatory Visit: Payer: Self-pay | Admitting: Family Medicine

## 2015-12-09 NOTE — Telephone Encounter (Signed)
Please call refill of Ambien to pharmacy as approved.

## 2015-12-09 NOTE — Telephone Encounter (Signed)
Called in.

## 2015-12-24 ENCOUNTER — Other Ambulatory Visit: Payer: Self-pay | Admitting: Family Medicine

## 2016-01-13 ENCOUNTER — Telehealth: Payer: Self-pay

## 2016-01-13 ENCOUNTER — Other Ambulatory Visit: Payer: Self-pay | Admitting: Family Medicine

## 2016-01-13 NOTE — Telephone Encounter (Signed)
Pt is asking for a 90 day supply of refills of all on file that is possible to get 90 days.  Pt is hearing impaired and I could not get name of all the meds from interpreter the patient would only say all meds  Best number [phone]   [facility] pharmacy

## 2016-01-15 NOTE — Telephone Encounter (Signed)
Patient call back and will come in per Amy

## 2016-01-15 NOTE — Telephone Encounter (Signed)
Spoke to pt through interpreter service she has called for appt. I have advised her to call back for appt, but she needs to be seen for medication refills, so it will be best for her to come in to the urgent care. Interpreter has relayed the message to the patient.

## 2016-01-19 ENCOUNTER — Ambulatory Visit (INDEPENDENT_AMBULATORY_CARE_PROVIDER_SITE_OTHER): Payer: Medicare Other | Admitting: Family Medicine

## 2016-01-19 VITALS — BP 128/80 | HR 44 | Temp 98.1°F | Resp 16 | Ht 64.0 in | Wt 202.0 lb

## 2016-01-19 DIAGNOSIS — M1712 Unilateral primary osteoarthritis, left knee: Secondary | ICD-10-CM

## 2016-01-19 DIAGNOSIS — M810 Age-related osteoporosis without current pathological fracture: Secondary | ICD-10-CM

## 2016-01-19 DIAGNOSIS — N3941 Urge incontinence: Secondary | ICD-10-CM | POA: Diagnosis not present

## 2016-01-19 DIAGNOSIS — I10 Essential (primary) hypertension: Secondary | ICD-10-CM

## 2016-01-19 DIAGNOSIS — G4700 Insomnia, unspecified: Secondary | ICD-10-CM

## 2016-01-19 DIAGNOSIS — I878 Other specified disorders of veins: Secondary | ICD-10-CM | POA: Diagnosis not present

## 2016-01-19 MED ORDER — RAMIPRIL 10 MG PO CAPS: ORAL_CAPSULE | ORAL | Status: DC

## 2016-01-19 MED ORDER — CLONIDINE HCL 0.1 MG PO TABS: 0.1000 mg | ORAL_TABLET | Freq: Three times a day (TID) | ORAL | Status: DC

## 2016-01-19 MED ORDER — OMEPRAZOLE 20 MG PO CPDR: 20.0000 mg | DELAYED_RELEASE_CAPSULE | Freq: Every day | ORAL | Status: DC | PRN

## 2016-01-19 MED ORDER — ALENDRONATE SODIUM 70 MG PO TABS: 70.0000 mg | ORAL_TABLET | ORAL | Status: DC

## 2016-01-19 MED ORDER — METOPROLOL TARTRATE 50 MG PO TABS: 50.0000 mg | ORAL_TABLET | Freq: Two times a day (BID) | ORAL | Status: DC

## 2016-01-19 MED ORDER — AMLODIPINE BESYLATE 2.5 MG PO TABS: 2.5000 mg | ORAL_TABLET | Freq: Every day | ORAL | Status: DC

## 2016-01-19 MED ORDER — DICLOFENAC SODIUM 1 % TD GEL
2.0000 g | Freq: Four times a day (QID) | TRANSDERMAL | Status: DC
Start: 2016-01-19 — End: 2017-05-25

## 2016-01-19 MED ORDER — TOLTERODINE TARTRATE 2 MG PO TABS: ORAL_TABLET | ORAL | Status: DC

## 2016-01-19 MED ORDER — SIMVASTATIN 40 MG PO TABS: ORAL_TABLET | ORAL | Status: DC

## 2016-01-19 NOTE — Progress Notes (Signed)
By signing my name below I, Melissa Murphy, attest that this documentation has been prepared under the direction and in the presence of Melissa Agreste, MD. Electonically Signed. Melissa Murphy, Scribe 01/19/2016 at 2:18 PM  Subjective:    Patient ID: Melissa Murphy, female    DOB: 16-Jan-1936, 80 y.o.   MRN: [id number]  Chief Complaint  Patient presents with  . Medication Refill    needs all meds refill if possible    HPI Melissa Murphy is a 80 y.o. female who presents to the [facility] for multiple medication refills.  Pt's PCP is Melissa Forts, MD. Last office visit was Nov 2016.  At that visit multiple issues addressed. Recommended recheck in one month.   HTN Uncontrolled at last visit in Nov 2016. With BP of 187/81. Added amlodipine 2.5mg  and clonidine increased to 0.1 mg TID. CMP and CBC were normal at this visit.  Advised through interpretor Dec 31st to go follow up appointment in January 2017. Pt was a no show for that appointment. Advised to come in on June 1st via telephone call.   HLD Lab Results  Component Value Date   CHOL 136 06/28/2015   HDL 48 06/28/2015   LDLCALC 67 06/28/2015   TRIG 105 06/28/2015   CHOLHDL 2.8 06/28/2015   Lab Results  Component Value Date   ALT 15 06/28/2015   AST 25 06/28/2015   ALKPHOS 48 06/28/2015   BILITOT 0.5 06/28/2015   Takes zocor 40mg  QD.   Urinary incontinence. Declined medication last visit. Detrol is listed on med list.   Pt is taking fosamax for Osteoporosis. Bone density test April 2016: T score -1.7 spine and -2.3 at femur. Osteopenia by this scoring.   Osteoarthritis of bilat knees Prescribed voltaren gel. Advised to continue tylenol as needed.  Insomnia controlled with Ambien 10mg . Risks of this medication with pt's age discussed during last visit.   Lower extremity edema Taken 20-40 lasix QD PRN.   GERD stable last visit with 20mg  omeprazole BID, only taken as needed last  visit.  Hearing difficulty. Pt is deaf.  Patient Active Problem List   Diagnosis Date Noted  . Deafness 04/03/2014  . Urge incontinence of urine 04/01/2014  . Primary osteoarthritis of left knee 04/01/2014  . Insomnia 04/01/2014  . Hiatal hernia 12/08/2013  . Preop cardiovascular exam 09/25/2013  . Obstructive sleep apnea 01/10/2013  . Uterine polyp   . Unspecified chronic bronchitis (Melissa Murphy) 10/02/2012  . 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 Early satiety 09/10/2012  . Bradycardia 08/19/2012  . Hypertension 04/30/2012  . Edema 09/18/2011  . Leg pain, bilateral 09/18/2011  . Stool color abnormal 09/18/2011  . Anemia 09/18/2011   Past Medical History  Diagnosis Date  . GERD (gastroesophageal reflux disease)   . Hypertension   . Hyperlipidemia   . Glaucoma   . Urinary incontinence   . Edema   . Anemia   . Arthritis   . DVT (deep venous thrombosis) (HCC)     in eye, and leg  . Osteoporosis   . Ulcer   . Hernia   . Deaf     Congenital; Requires an interpreter  . Uterine polyp   . Hiatal hernia   . Sleep apnea   . Clotting disorder ([facility])   . Thyroid disease   . Cataract    Past Surgical History  Procedure Laterality Date  . Cataract extraction      both eyes  . Goiter removed  age 64  . Xanthoma tumor removed    . Refractive surgery    . Dilatation & currettage/hysteroscopy with resectocope N/A 10/11/2012    Procedure: DILATATION & CURETTAGE/HYSTEROSCOPY WITH RESECTOCOPE;  Surgeon: Melissa Katz, MD;  Location: [facility];  Service: Gynecology;  Laterality: N/A;  pt is deaf; please contact daughter Melissa Murphy [phone]  . Eye surgery    . Hernia repair    . Tonsillectomy    . Hiatal hernia repair N/A 12/08/2013    Procedure: LAPAROSCOPIC REPAIR OF HIATAL HERNIA  ;  Surgeon: Melissa Hector, MD;  Location: [facility];  Service: General;  Laterality: N/A;   Allergies  Allergen Reactions  . Avastin [Bevacizumab] Nausea And Vomiting  .  Clindamycin/Lincomycin Itching  . Novocain [Procaine] Other (See Comments)    seizures  . Sulfa Antibiotics Other (See Comments)    fever   Prior to Admission medications   Medication Sig Start Date End Date Taking? Authorizing Provider  alendronate (FOSAMAX) 70 MG tablet Take 1 tablet (70 mg total) by mouth once a week. Take with a full glass of water on an empty stomach. 12/30/14  Yes Melissa Honour, MD  amLODipine (NORVASC) 2.5 MG tablet Take 1 tablet (2.5 mg total) by mouth daily. 06/28/15  Yes Melissa Honour, MD  aspirin 81 MG tablet Take 81 mg by mouth every morning.    Yes Historical Provider, MD  cloNIDine (CATAPRES) 0.1 MG tablet TAKE 1 TABLET BY MOUTH 3 TIMES A DAY 10/07/15  Yes Melissa Honour, MD  diclofenac sodium (VOLTAREN) 1 % GEL Apply 2 g topically 4 (four) times daily. 06/28/15  Yes Melissa Honour, MD  dorzolamide-timolol (COSOPT) 22.3-6.8 MG/ML ophthalmic solution Place 1 drop into the right eye 2 (two) times daily.   Yes Historical Provider, MD  furosemide (LASIX) 20 MG tablet TAKE 1-2 TABLET (20 MG TOTAL) BY MOUTH EVERY MORNING. 05/17/15  Yes Melissa Honour, MD  meloxicam (MOBIC) 15 MG tablet TAKE 1 TABLET (15 MG TOTAL) BY MOUTH DAILY 10/07/15  Yes Melissa Honour, MD  metoprolol (LOPRESSOR) 50 MG tablet TAKE 1 TABLET BY MOUTH TWICE A DAY 12/27/15  Yes Melissa Honour, MD  omeprazole (PRILOSEC) 20 MG capsule Take 20 mg by mouth 2 (two) times daily before a meal.   Yes Historical Provider, MD  ramipril (ALTACE) 10 MG capsule TAKE 1 CAPSULE (10 MG TOTAL) BY MOUTH DAILY. 07/26/15  Yes Melissa Honour, MD  simvastatin (ZOCOR) 40 MG tablet TAKE 1 TABLET (40 MG TOTAL) BY MOUTH AT BEDTIME. 12/27/15  Yes Melissa Honour, MD  tolterodine (DETROL) 2 MG tablet TAKE 1 TABLET (2 MG TOTAL) BY MOUTH 2 (TWO) TIMES DAILY. 11/30/15  Yes Melissa Knapp, MD  zolpidem (AMBIEN) 10 MG tablet Take 1 tablet (10 mg total) by mouth at bedtime. 12/09/15  Yes Melissa Honour, MD   Social History   Social History  .  Marital Status: Married    Spouse Name: N/A  . Number of Children: 3  . Years of Education: Grad    Occupational History  . Teacher- Retired    Social History Main Topics  . Smoking status: Never Smoker   . Smokeless tobacco: Never Used  . Alcohol Use: No     Comment: occasionally  . Drug Use: No  . Sexual Activity: Not Currently   Other Topics Concern  . Not on file   Social History Narrative   Marital status: married x 1999;  second marriage; first husband died from AMI in 73;  husband is deaf as well.  Moderately happy; no abuse.      Children: 3 children; 2 grandchildren.  2 gg      Lives: with husband and daughter.  Moved from [place]; still has home in [place] but plans to remain in [place].      Employment: retired; Hospital doctor.      Tobacco:  Never      Alcohol: none      Exercising:  Walking and cleaning the house.      ADLs:  Uses cane for ambulation; has walker; no cooking; cleans house on own; pays own bills; goes to grocery store with husband. Performs self hygiene.        Advanced Directives:  None.  FULL CODE desired; no prolonged measures.     Review of Systems     Objective:   Physical Exam   Filed Vitals:   01/19/16 1338  BP: 128/80  Pulse: 44  Temp: 98.1 F (36.7 C)  TempSrc: Oral  Resp: 16  Height: 5\' 4"  (1.626 m)  Weight: 202 lb (91.627 kg)  SpO2: 95%        Assessment & Plan:   See history above. Due to current time and other obligation, other provider to see patient today. History was provided as above and advised Dr. Wendi Snipes.  See Dr. Alen Bleacher note for today's office visit.  Did communicate this plan with patient via writing on paper due to hearing difficulty. Also recommended to follow-up with Dr. Tamala Julian by appointment for routine medications if possible next visit.

## 2016-01-19 NOTE — Progress Notes (Signed)
HPI  Patient presents today here for medication refill and leg swelling.   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 They speak to each other through sign language  Leg swelling Has been taking lasix for quite some time, would like compression stockings. Chronic and largely unchanged but is bothersome to the patient. Lasix giving good diuresis  HTN Good medication compliance, has missed clonidine a few times and has been able to correlate elevated pressures with missing meds No Chest pain, dyspnea, palps. Leg swelling stable Doing well with new meds  Knee pain Takes mobic daily, she is out currently Taking aleve today with good relief.  Ok to make a change to aleve and try tylenol instead of NSAIDs   Would like refill on all meds, states that she could not get appt in a timely manner with PCP, (4 month wait). I discussed waiting on ambien and getting optho to do her eye drops otherwise I refilled them. I explains scheduling 4 months out is not unreasonable and encouraged her to get the appt set today to follow up routinely with her PCP    PMH: Smoking status noted ROS: Per HPI  Objective: BP 128/80 mmHg  Pulse 44  Temp(Src) 98.1 F (36.7 C) (Oral)  Resp 16  Ht 5\' 4"  (1.626 m)  Wt 202 lb (91.627 kg)  BMI 34.66 kg/m2  SpO2 95% Gen: NAD, alert, cooperative with exam HEENT: NCAT CV: RRR, good S1/S2, no murmur Resp: CTABL, no wheezes, non-labored Ext: 1 + pitting edema with hemosiderin staining Neuro: Alert and oriented, No gross deficits  Assessment and plan:  # HTN Doing well with CCB, BB, and clonidine Refilled X 6 months CCB possibly contributing to leg swelling but this has been a long standing problem, monitor  # venous stasis Written Rx for compression stockings Has hemosiderin staining, chronic problem  # OA of the knee DC meloxicam, it is ch=ontrolled on aleve I  have had a lengthy discussion (through writing on a pad:-) about reducing NSAID burden and using as little medicine as possible. Prefer tylenol, which she will try.  Continue aleve 1 pill once or twice a day as needed (unless tylenol is adequate)  # insomnia Discussed waiting for refill of med until it is due with PCP, had Rx until October  Refilled other meds, abs good in November, plan repeat at next visit per PCP discretion     No orders of the defined types were placed in this encounter.    Meds ordered this encounter  Medications  . diclofenac sodium (VOLTAREN) 1 % GEL    Sig: Apply 2 g topically 4 (four) times daily.    Dispense:  100 g    Refill:  5  . amLODipine (NORVASC) 2.5 MG tablet    Sig: Take 1 tablet (2.5 mg total) by mouth daily.    Dispense:  90 tablet    Refill:  3  . alendronate (FOSAMAX) 70 MG tablet    Sig: Take 1 tablet (70 mg total) by mouth once a week. Take with a full glass of water on an empty stomach.    Dispense:  13 tablet    Refill:  3  . cloNIDine (CATAPRES) 0.1 MG tablet    Sig: Take 1 tablet (0.1 mg total) by mouth 3 (three) times daily.    Dispense:  270 tablet    Refill:  1  .  metoprolol (LOPRESSOR) 50 MG tablet    Sig: Take 1 tablet (50 mg total) by mouth 2 (two) times daily.    Dispense:  180 tablet    Refill:  1  . ramipril (ALTACE) 10 MG capsule    Sig: TAKE 1 CAPSULE (10 MG TOTAL) BY MOUTH DAILY.    Dispense:  90 capsule    Refill:  1  . simvastatin (ZOCOR) 40 MG tablet    Sig: TAKE 1 TABLET (40 MG TOTAL) BY MOUTH AT BEDTIME.    Dispense:  90 tablet    Refill:  1    PATIENT NEEDS OFFICE VISIT FOR ADDITIONAL REFILLS  . tolterodine (DETROL) 2 MG tablet    Sig: TAKE 1 TABLET (2 MG TOTAL) BY MOUTH 2 (TWO) TIMES DAILY.    Dispense:  180 tablet    Refill:  1  . omeprazole (PRILOSEC) 20 MG capsule    Sig: Take 1 capsule (20 mg total) by mouth daily as needed (heartburn).    Dispense:  30 capsule    Refill:  3    Kenn File,  MD 4:23 PM

## 2016-01-19 NOTE — Patient Instructions (Addendum)
     IF you received an x-ray today, you will receive an invoice from [facility]. Please contact [facility] at [phone] with questions or concerns regarding your invoice.   IF you received labwork today, you will receive an invoice from [employer]. Please contact [facility] at [phone] with questions or concerns regarding your invoice.   Our billing staff will not be able to assist you with questions regarding bills from these companies.  You will be contacted with the lab results as soon as they are available. The fastest way to get your results is to activate your My Chart account. Instructions are located on the last page of this paperwork. If you have not heard from [place] regarding the results in 2 weeks, please contact this office.    Great to meet you!  I have sent 6 months of medications, You will need to be seen for labs and refill of other meds in September or October.   Our changes: Stop meloxicam, try tylenol as a first choice instead, if you need to aleve is safer over all than meloxicam  Take omeprazole as needed, pepcid or zantac could be a better alternative for as needed symptoms  Try metamucil 1 scoop daily for 1 week then 1 scoop twice daily if you are not seeing good relief from constipation. If you have recurrence or worsening of bleeding please come back

## 2016-01-20 ENCOUNTER — Telehealth: Payer: Self-pay

## 2016-01-20 NOTE — Telephone Encounter (Signed)
Pt would like a refill on her tolterodine (DETROL LA) 4 MG 24 hr capsule [id number] DISCONTINUED. She was just seen on 6/7/ by Wendi Snipes. She says this was discussed. Pharmacy:  [facility] #[id number] [address], [place]. She wants to know why she is taking amLODipine (NORVASC) 2.5 MG tablet [id number]. Please advise at [phone]. Please leave a message-they deaf.

## 2016-01-24 NOTE — Telephone Encounter (Signed)
Called daughter (as instr'd on contact info since pt is hearing impaired) and Left detailed message that Detrol Rx was sent to pharm on 6/7 along with the other meds and they may need to speak to a person at the pharm and not just the system for them to see the Rx on file. Also explained amlodipine was added to pt's BP treatment at the [facility] before the 6/7 OV because pt's BP was quite high, along with increasing the dose of the BP med she had already been on. Asked daughter to CB if there are any further questions.

## 2016-04-14 ENCOUNTER — Encounter (HOSPITAL_COMMUNITY): Payer: Self-pay | Admitting: Emergency Medicine

## 2016-04-14 ENCOUNTER — Emergency Department (HOSPITAL_COMMUNITY)
Admission: EM | Admit: 2016-04-14 | Discharge: 2016-04-14 | Discharge status: Absent without leave | Payer: Medicare Other

## 2016-04-14 ENCOUNTER — Ambulatory Visit (HOSPITAL_COMMUNITY)
Admission: RE | Admit: 2016-04-14 | Discharge: 2016-04-14 | Disposition: A | Discharge status: Home / Self Care | Payer: Medicare Other | Source: Ambulatory Visit | Attending: Family Medicine | Admitting: Family Medicine

## 2016-04-14 ENCOUNTER — Ambulatory Visit (INDEPENDENT_AMBULATORY_CARE_PROVIDER_SITE_OTHER): Payer: Medicare Other | Admitting: Physician Assistant

## 2016-04-14 VITALS — BP 120/72 | HR 59 | Temp 98.7°F | Resp 16 | Ht 64.0 in | Wt 203.6 lb

## 2016-04-14 DIAGNOSIS — I10 Essential (primary) hypertension: Secondary | ICD-10-CM | POA: Insufficient documentation

## 2016-04-14 DIAGNOSIS — Z7982 Long term (current) use of aspirin: Secondary | ICD-10-CM | POA: Diagnosis not present

## 2016-04-14 DIAGNOSIS — M79602 Pain in left arm: Secondary | ICD-10-CM

## 2016-04-14 DIAGNOSIS — M79605 Pain in left leg: Secondary | ICD-10-CM | POA: Diagnosis not present

## 2016-04-14 DIAGNOSIS — Y939 Activity, unspecified: Secondary | ICD-10-CM | POA: Insufficient documentation

## 2016-04-14 DIAGNOSIS — Z23 Encounter for immunization: Secondary | ICD-10-CM | POA: Diagnosis not present

## 2016-04-14 DIAGNOSIS — Y999 Unspecified external cause status: Secondary | ICD-10-CM | POA: Insufficient documentation

## 2016-04-14 DIAGNOSIS — W540XXA Bitten by dog, initial encounter: Secondary | ICD-10-CM | POA: Insufficient documentation

## 2016-04-14 DIAGNOSIS — Y929 Unspecified place or not applicable: Secondary | ICD-10-CM | POA: Insufficient documentation

## 2016-04-14 DIAGNOSIS — S81852A Open bite, left lower leg, initial encounter: Secondary | ICD-10-CM | POA: Diagnosis not present

## 2016-04-14 DIAGNOSIS — M7989 Other specified soft tissue disorders: Secondary | ICD-10-CM | POA: Diagnosis not present

## 2016-04-14 DIAGNOSIS — Z79899 Other long term (current) drug therapy: Secondary | ICD-10-CM | POA: Insufficient documentation

## 2016-04-14 MED ORDER — MEDICAL COMPRESSION STOCKINGS MISC: 1 refills | Status: DC

## 2016-04-14 NOTE — ED Notes (Signed)
PT IS HEARING IMPAIRED

## 2016-04-14 NOTE — ED Triage Notes (Signed)
Patient presents for vascular scan of left leg to r/t DVT. Patient sent by PCP. Paperwork states to go to [facility] for vascular study. Patient stated [facility] is closer to her home and that's why she came here. Patient informed that vascular is not here after 7pm but she will be evaluated by an ED provider and a POC will be discussed. Patient verbalizes understanding of same.  Louann sign language interpretor use.

## 2016-04-14 NOTE — Patient Instructions (Addendum)
Please report to [facility] located at [place]. [employer]. Please go in through the [facility] [facility] and go to ADMITTING. The vascular tech will then get you ready for your study.      IF you received an x-ray today, you will receive an invoice from [facility]. Please contact [facility] at [phone] with questions or concerns regarding your invoice.   IF you received labwork today, you will receive an invoice from [employer]. Please contact [facility] at [phone] with questions or concerns regarding your invoice.   Our billing staff will not be able to assist you with questions regarding bills from these companies.  You will be contacted with the lab results as soon as they are available. The fastest way to get your results is to activate your My Chart account. Instructions are located on the last page of this paperwork. If you have not heard from [place] regarding the results in 2 weeks, please contact this office.

## 2016-04-14 NOTE — Progress Notes (Signed)
04/14/2016 6:00 PM   DOB: 1935-09-09 / MRN: [id number]  SUBJECTIVE:  Melissa Murphy is a 80 y.o. deaf female presenting for left lower leg pain.  She has a history of DVT.  Reports that her dog jumped into her lap and the dogs claws punctured her leg.  She dressed those wounds with neosporin and a bandage.  She is more concerned about lower posterior calf pain.  She has had a blood clot in the past and is worried she may have one again.  Her leg is quite swollen out of proportion to normal. She is also concerned that she had quite a lot of clear fluid coming out of her legs after the injury.    Immunization History  Administered Date(s) Administered  . Influenza,inj,Quad PF,36+ Mos 04/01/2014, 06/28/2015  . Pneumococcal Conjugate-13 04/01/2014  . Pneumococcal Polysaccharide-23 08/14/2009  . Td 08/14/2009  . Zoster 08/14/2009     She is allergic to avastin [bevacizumab]; clindamycin/lincomycin; novocain [procaine]; and sulfa antibiotics.   She  has a past medical history of Anemia; Arthritis; Cataract; Clotting disorder ([place]); Deaf; DVT (deep venous thrombosis) ([place]); Edema; GERD (gastroesophageal reflux disease); Glaucoma; Hernia; Hiatal hernia; Hyperlipidemia; Hypertension; Osteoporosis; Sleep apnea; Thyroid disease; Ulcer; Urinary incontinence; and Uterine polyp.    She  reports that she has never smoked. She has never used smokeless tobacco. She reports that she does not drink alcohol or use drugs. She  reports that she does not currently engage in sexual activity. The patient  has a past surgical history that includes Cataract extraction; goiter removed; xanthoma tumor removed; Refractive surgery; Dilatation & currettage/hysteroscopy with resectoscope (N/A, 10/11/2012); Eye surgery; Hernia repair; Tonsillectomy; and Hiatal hernia repair (N/A, 12/08/2013).  Her family history includes Cancer (age of onset: 72) in her mother; Heart disease in her brother, mother, and paternal grandmother;  Heart disease (age of onset: 85) in her father; Hernia in her brother; Hyperlipidemia in her brother; Hypertension in her brother and mother; Migraines in her daughter; Stroke (age of onset: 23) in her mother.  Review of Systems  Respiratory: Negative for cough.   Cardiovascular: Positive for leg swelling (left greater than right). Negative for chest pain.  Musculoskeletal: Positive for myalgias. Negative for joint pain.  Skin: Negative for rash.    The problem list and medications were reviewed and updated by myself where necessary and exist elsewhere in the encounter.   OBJECTIVE:  BP 120/72 (BP Location: Right Arm, Patient Position: Sitting, Cuff Size: Large)   Pulse (!) 59   Temp 98.7 F (37.1 C)   Resp 16   Ht 5\' 4"  (1.626 m)   Wt 203 lb 9.6 oz (92.4 kg)   SpO2 96%   BMI 34.95 kg/m   Physical Exam  Constitutional: Vital signs are normal. She appears well-developed and well-nourished.  Cardiovascular: Normal rate.   Musculoskeletal:       Right lower leg: She exhibits edema.       Left lower leg: She exhibits edema (Left > right. Tenderness.  No palpable chord. ).       Legs: Vitals reviewed.   No results found for this or any previous visit (from the past 72 hour(s)).  No results found.  ASSESSMENT AND PLAN  Catie was seen today for other and other.  Diagnoses and all orders for this visit    Leg pain, posterior, left: She has a history of DVT.  She has trauma to her leg from her dog jumping on her leg last  night.  Her calf is paining her and she has abnormal swelling for her. If her leg scan positive she will go to the ED to receive care as she is deaf and I worry about communicating with her on the phone.  If negative she will go home and purchase her compression stockings tomorrow.        -     VAS [place] LOWER EXTREMITY VENOUS (DVT); Future  Leg swelling bilateral: Advised she fill this after ruling out DVT.  -     Elastic Bandages & Supports (MEDICAL  COMPRESSION STOCKINGS) MISC; Please measure and apply.    The patient is advised to call or return to clinic if she does not see an improvement in symptoms, or to seek the care of the closest emergency department if she worsens with the above plan.   Philis Fendt, MHS, PA-C [facility] 04/14/2016 6:00 PM   Addendum: I see in EPIC that her scan was rescheduled.  I called the patient and left a video voice mail at [phone] and advised that if she has any chest pain or SOB to proceed directly to the ED. I did encourage her to go to the scan as soon as possible. Philis Fendt, MS, PA-C 8:52 PM, 04/14/2016

## 2016-04-14 NOTE — Progress Notes (Signed)
Received call from [facility] at 5:50 pm to add on a lower extremity venous order. I instructed that patient will need to come over immediately to be seen at this time.  Checked with admitting that patient has not arrived yet for test and will need to reschedule. 6:30pm Gave number to admitting to call if patient wants to reschedule. Also left message on patient's phone about rescheduling.   Landry Mellow, RDMS, RVT Vascular lab

## 2016-04-15 ENCOUNTER — Emergency Department (HOSPITAL_COMMUNITY)
Admission: EM | Admit: 2016-04-15 | Discharge: 2016-04-15 | Disposition: A | Discharge status: Home / Self Care | Payer: Medicare Other | Attending: Emergency Medicine | Admitting: Emergency Medicine

## 2016-04-15 ENCOUNTER — Ambulatory Visit (HOSPITAL_BASED_OUTPATIENT_CLINIC_OR_DEPARTMENT_OTHER)
Admission: RE | Admit: 2016-04-15 | Discharge: 2016-04-15 | Disposition: A | Discharge status: Home / Self Care | Payer: Medicare Other | Source: Ambulatory Visit | Attending: Family Medicine | Admitting: Family Medicine

## 2016-04-15 DIAGNOSIS — M7989 Other specified soft tissue disorders: Secondary | ICD-10-CM | POA: Insufficient documentation

## 2016-04-15 DIAGNOSIS — S81852A Open bite, left lower leg, initial encounter: Secondary | ICD-10-CM | POA: Diagnosis not present

## 2016-04-15 DIAGNOSIS — R609 Edema, unspecified: Secondary | ICD-10-CM

## 2016-04-15 DIAGNOSIS — W540XXA Bitten by dog, initial encounter: Secondary | ICD-10-CM

## 2016-04-15 LAB — D-DIMER, QUANTITATIVE: D-Dimer, Quant: 1.6 ug/mL-FEU — ABNORMAL HIGH (ref 0.00–0.50)

## 2016-04-15 MED ORDER — AMOXICILLIN-POT CLAVULANATE 875-125 MG PO TABS
1.0000 | ORAL_TABLET | Freq: Once | ORAL | Status: AC
  Administered 2016-04-15: 1 via ORAL
  Filled 2016-04-15: qty 1

## 2016-04-15 MED ORDER — AMOXICILLIN-POT CLAVULANATE 875-125 MG PO TABS: 1.0000 | ORAL_TABLET | Freq: Two times a day (BID) | ORAL | 0 refills | Status: DC

## 2016-04-15 NOTE — Progress Notes (Signed)
VASCULAR LAB PRELIMINARY  PRELIMINARY  PRELIMINARY  PRELIMINARY  Left lower extremity venous duplex completed.    Preliminary report:  There is no DVT or SVT noted in the left lower extremity.  Significant interstitial fluid noted in the left calf.   Arlynn Stare, RVT 04/15/2016, 1:22 PM

## 2016-04-15 NOTE — Discharge Instructions (Signed)
As discussed, it is important a return tomorrow for additional studies of your left leg to ensure that there is no blood clot.  Return here if you develop new or concerning changes in the interim.   It is equally important that you follow-up with your primary care physician within the next week to ensure that her wounds are improving.

## 2016-04-15 NOTE — ED Provider Notes (Signed)
[facility] DEPT Provider Note   CSN: [id number] Arrival date & time: 04/14/16  2201  By signing my name below, I, Dolores Hoose, attest that this documentation has been prepared under the direction and in the presence of Carmin Muskrat, MD . Electronically Signed: Dolores Hoose, Scribe. 04/15/2016. 12:31 AM.   History   Chief Complaint No chief complaint on file.  The history is provided by the patient. A language interpreter was used.     HPI Comments:  Melissa Murphy is a 80 y.o. female who presents to the Emergency Department complaining of intermittent worsening left lower leg swelling onset last night. Pt reports that the swelling has been coming and going for a while, but worsened last night when her dog jumped onto her legs and scratched her. Pt notes she saw some fluid drainage from the wound. She was seen earlier with fears that she had a blood clot in her legs and was told to come to the emergency room. She states that she has had blood clots in the past. Pt notes associated wound to the area. She denies any fever, vomiting, chest pain or difficulty breathing.    Past Medical History:  Diagnosis Date  . Anemia   . Arthritis   . Cataract   . Clotting disorder ([place])   . Deaf    Congenital; Requires an interpreter  . DVT (deep venous thrombosis) ([facility])    in eye, and leg  . Edema   . GERD (gastroesophageal reflux disease)   . Glaucoma   . Hernia   . Hiatal hernia   . Hyperlipidemia   . Hypertension   . Osteoporosis   . Sleep apnea   . Thyroid disease   . Ulcer   . Urinary incontinence   . Uterine polyp     Patient Active Problem List   Diagnosis Date Noted  . Deafness 04/03/2014  . Urge incontinence of urine 04/01/2014  . Primary osteoarthritis of left knee 04/01/2014  . Insomnia 04/01/2014  . Hiatal hernia 12/08/2013  . Preop cardiovascular exam 09/25/2013  . Obstructive sleep apnea 01/10/2013  . Uterine polyp   . Unspecified chronic bronchitis ([place])  10/02/2012  . 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 Early satiety 09/10/2012  . Bradycardia 08/19/2012  . Hypertension 04/30/2012  . Edema 09/18/2011  . Leg pain, bilateral 09/18/2011  . Stool color abnormal 09/18/2011  . Anemia 09/18/2011    Past Surgical History:  Procedure Laterality Date  . CATARACT EXTRACTION     both eyes  . DILATATION & CURRETTAGE/HYSTEROSCOPY WITH RESECTOCOPE N/A 10/11/2012   Procedure: DILATATION & CURETTAGE/HYSTEROSCOPY WITH RESECTOCOPE;  Surgeon: Allena Katz, MD;  Location: [facility];  Service: Gynecology;  Laterality: N/A;  pt is deaf; please contact daughter Heide Spark [phone]  . EYE SURGERY    . goiter removed     age 42  . HERNIA REPAIR    . HIATAL HERNIA REPAIR N/A 12/08/2013   Procedure: LAPAROSCOPIC REPAIR OF HIATAL HERNIA  ;  Surgeon: [name], MD;  Location: [facility];  Service: General;  Laterality: N/A;  . REFRACTIVE SURGERY    . TONSILLECTOMY    . xanthoma tumor removed      OB History    No data available       Home Medications    Prior to Admission medications   Medication Sig Start Date End Date Taking? Authorizing Provider  alendronate (FOSAMAX) 70 MG tablet Take 1 tablet (70 mg total) by mouth once  a week. Take with a full glass of water on an empty stomach. 01/19/16   Timmothy Euler, MD  amLODipine (NORVASC) 2.5 MG tablet Take 1 tablet (2.5 mg total) by mouth daily. 01/19/16   Timmothy Euler, MD  aspirin 81 MG tablet Take 81 mg by mouth every morning.     Historical Provider, MD  cloNIDine (CATAPRES) 0.1 MG tablet Take 1 tablet (0.1 mg total) by mouth 3 (three) times daily. Patient taking differently: Take 0.1 mg by mouth as needed.  01/19/16   Timmothy Euler, MD  diclofenac sodium (VOLTAREN) 1 % GEL Apply 2 g topically 4 (four) times daily. 01/19/16   Timmothy Euler, MD  dorzolamide-timolol (COSOPT) 22.3-6.8 MG/ML ophthalmic solution Place 1 drop into the right eye 2 (two) times daily.    Historical  Provider, MD  Elastic Bandages & Supports (MEDICAL COMPRESSION STOCKINGS) MISC Please measure and apply. 04/14/16   Tereasa Coop, PA-C  furosemide (LASIX) 20 MG tablet TAKE 1-2 TABLET (20 MG TOTAL) BY MOUTH EVERY MORNING. 05/17/15   Wardell Honour, MD  metoprolol (LOPRESSOR) 50 MG tablet Take 1 tablet (50 mg total) by mouth 2 (two) times daily. 01/19/16   Timmothy Euler, MD  omeprazole (PRILOSEC) 20 MG capsule Take 1 capsule (20 mg total) by mouth daily as needed (heartburn). 01/19/16   Timmothy Euler, MD  ramipril (ALTACE) 10 MG capsule TAKE 1 CAPSULE (10 MG TOTAL) BY MOUTH DAILY. 01/19/16   Timmothy Euler, MD  simvastatin (ZOCOR) 40 MG tablet TAKE 1 TABLET (40 MG TOTAL) BY MOUTH AT BEDTIME. 01/19/16   Timmothy Euler, MD  tolterodine (DETROL) 2 MG tablet TAKE 1 TABLET (2 MG TOTAL) BY MOUTH 2 (TWO) TIMES DAILY. 01/19/16   Timmothy Euler, MD  zolpidem (AMBIEN) 10 MG tablet Take 1 tablet (10 mg total) by mouth at bedtime. 12/09/15   Wardell Honour, MD    Family History Family History  Problem Relation Age of Onset  . Cancer Mother 37    colon cancer  . Stroke Mother 82    cause of death  . Heart disease Mother   . Hypertension Mother   . Heart disease Father 58    AMI; cause of death  . Heart disease Brother     AMI x 2; tobacco abuse; CABG  . Hernia Brother   . Hyperlipidemia Brother   . Hypertension Brother   . Migraines Daughter   . Heart disease Paternal Grandmother   . Esophageal cancer Neg Hx   . Rectal cancer Neg Hx   . Stomach cancer Neg Hx     Social History Social History  Substance Use Topics  . Smoking status: Never Smoker  . Smokeless tobacco: Never Used  . Alcohol use No     Comment: occasionally     Allergies   Avastin [bevacizumab]; Clindamycin/lincomycin; Novocain [procaine]; and Sulfa antibiotics   Review of Systems Review of Systems  Constitutional:       Per HPI, otherwise negative  HENT:       Per HPI, otherwise negative  Respiratory:        Per HPI, otherwise negative  Cardiovascular:       Per HPI, otherwise negative  Gastrointestinal: Negative for vomiting.  Endocrine:       Negative aside from HPI  Genitourinary:       Neg aside from HPI   Musculoskeletal:       Per HPI, otherwise negative  Skin: Positive for wound.  Neurological: Negative for syncope.     Physical Exam Updated Vital Signs BP 171/76 (BP Location: Left Arm)   Pulse 76   Temp 99 F (37.2 C) (Oral)   Resp 18   SpO2 99%   Physical Exam  Constitutional: She is oriented to person, place, and time. She appears well-developed and well-nourished. No distress.  HENT:  Head: Normocephalic and atraumatic.  Eyes: Conjunctivae and EOM are normal.  Cardiovascular: Normal rate and regular rhythm.   Pulmonary/Chest: Effort normal and breath sounds normal. No stridor. No respiratory distress.  Abdominal: She exhibits no distension.  Musculoskeletal: She exhibits edema.  Asymmetrical lower extremity edema, left worse than right.  Neurological: She is alert and oriented to person, place, and time.  Patient is deaf  Skin: Skin is warm and dry.     Psychiatric: She has a normal mood and affect.  Nursing note and vitals reviewed.    ED Treatments / Results  DIAGNOSTIC STUDIES:  Oxygen Saturation is 99% on RA, normal by my interpretation.    COORDINATION OF CARE:  12:30 AM Discussed treatment plan with pt at bedside which included bloodwork and pt agreed to plan.  Labs (all labs ordered are listed, but only abnormal results are displayed) Labs Reviewed  D-DIMER, QUANTITATIVE (NOT AT [facility]) - Abnormal; Notable for the following:       Result Value   D-Dimer, Quant 1.60 (*)    All other components within normal limits     Procedures Procedures (including critical care time)  Medications Ordered in ED Medications  amoxicillin-clavulanate (AUGMENTIN) 875-125 MG per tablet 1 tablet (1 tablet Oral Given 04/15/16 0107)     Initial Impression /  Assessment and Plan / ED Course  I have reviewed the triage vital signs and the nursing notes.  Pertinent labs & imaging results that were available during my care of the patient were reviewed by me and considered in my medical decision making (see chart for details).  Clinical Course    2:14 AM Patient in no distress. We discussed all findings via interpreter again. I offered the option of staying until the morning for vascular study, the patient requested discharge with returning later today for the study. We discussed the importance of this, as well as antibiotics.   Final Clinical Impressions(s) / ED Diagnoses   Final diagnoses:  Dog bite  Swelling of lower extremity    Patient with a history of DVT presents with new intermittent left lower extremity swelling, and new concern of dog bite or scratch. Given the patient's history, d-dimer was performed, and this was positive. Patient's history would be unusual for DVT given the inconsistent swelling, minimal other complaints. However, the patient elevated d-dimer, will return later today for definitive evaluation. Given concern for dog bite, the patient was started on antibiotics as well.   New Prescriptions New Prescriptions   AMOXICILLIN-CLAVULANATE (AUGMENTIN) 875-125 MG TABLET    Take 1 tablet by mouth 2 (two) times daily.   I personally performed the services described in this documentation, which was scribed in my presence. The recorded information has been reviewed and is accurate.        Carmin Muskrat, MD 04/15/16 [phone]

## 2016-04-27 DIAGNOSIS — N3944 Nocturnal enuresis: Secondary | ICD-10-CM | POA: Diagnosis not present

## 2016-04-27 DIAGNOSIS — N3946 Mixed incontinence: Secondary | ICD-10-CM | POA: Diagnosis not present

## 2016-04-28 ENCOUNTER — Encounter: Payer: Self-pay | Admitting: Physician Assistant

## 2016-04-28 ENCOUNTER — Ambulatory Visit (INDEPENDENT_AMBULATORY_CARE_PROVIDER_SITE_OTHER): Payer: Medicare Other | Admitting: Physician Assistant

## 2016-04-28 VITALS — BP 132/80 | HR 63 | Temp 99.0°F | Resp 17 | Ht 64.5 in | Wt 204.0 lb

## 2016-04-28 DIAGNOSIS — Z1211 Encounter for screening for malignant neoplasm of colon: Secondary | ICD-10-CM | POA: Diagnosis not present

## 2016-04-28 DIAGNOSIS — Z13228 Encounter for screening for other metabolic disorders: Secondary | ICD-10-CM

## 2016-04-28 DIAGNOSIS — Z130 Encounter for screening for diseases of the blood and blood-forming organs and certain disorders involving the immune mechanism: Secondary | ICD-10-CM | POA: Diagnosis not present

## 2016-04-28 DIAGNOSIS — M1711 Unilateral primary osteoarthritis, right knee: Secondary | ICD-10-CM | POA: Diagnosis not present

## 2016-04-28 DIAGNOSIS — Z1239 Encounter for other screening for malignant neoplasm of breast: Secondary | ICD-10-CM | POA: Diagnosis not present

## 2016-04-28 DIAGNOSIS — Z1329 Encounter for screening for other suspected endocrine disorder: Secondary | ICD-10-CM | POA: Diagnosis not present

## 2016-04-28 DIAGNOSIS — Z79899 Other long term (current) drug therapy: Secondary | ICD-10-CM | POA: Diagnosis not present

## 2016-04-28 DIAGNOSIS — Z23 Encounter for immunization: Secondary | ICD-10-CM | POA: Diagnosis not present

## 2016-04-28 DIAGNOSIS — B370 Candidal stomatitis: Secondary | ICD-10-CM

## 2016-04-28 LAB — CBC
HEMATOCRIT: 39.1 % (ref 35.0–45.0)
Hemoglobin: 13 g/dL (ref 11.7–15.5)
MCH: 30.5 pg (ref 27.0–33.0)
MCHC: 33.2 g/dL (ref 32.0–36.0)
MCV: 91.8 fL (ref 80.0–100.0)
MPV: 10.2 fL (ref 7.5–12.5)
PLATELETS: 215 10*3/uL (ref 140–400)
RBC: 4.26 MIL/uL (ref 3.80–5.10)
RDW: 13.9 % (ref 11.0–15.0)
WBC: 7.3 10*3/uL (ref 3.8–10.8)

## 2016-04-28 LAB — TSH: TSH: 1.77 mIU/L

## 2016-04-28 MED ORDER — FLUCONAZOLE 100 MG PO TABS: 100.0000 mg | ORAL_TABLET | Freq: Every day | ORAL | 0 refills | Status: DC

## 2016-04-28 NOTE — Patient Instructions (Addendum)
  You can take 1,000 mg of Tylenol every 8 hours as needed for pain.    IF you received an x-ray today, you will receive an invoice from [facility]. Please contact [facility] at [phone] with questions or concerns regarding your invoice.   IF you received labwork today, you will receive an invoice from [employer]. Please contact [facility] at [phone] with questions or concerns regarding your invoice.   Our billing staff will not be able to assist you with questions regarding bills from these companies.  You will be contacted with the lab results as soon as they are available. The fastest way to get your results is to activate your My Chart account. Instructions are located on the last page of this paperwork. If you have not heard from [place] regarding the results in 2 weeks, please contact this office.

## 2016-04-28 NOTE — Progress Notes (Signed)
04/28/2016 3:30 PM   DOB: 19-Jun-1936 / MRN: [id number]  SUBJECTIVE:  Melissa Murphy is a 80 y.o. female presenting for an annual physical exam.  She was recently ruled out for blood clot by me.   She needs a colonoscopy and mammogram.  See assessment and plan for detail on this.  She has a history of osteopenia diagnosed on bone scan. She is prescribed fosamax but often forgets to take this.  She does not take Vitamin D and Calcium consistently.    She has never smoked.  She does not consume alcohol.    She also complains of pain in the right lower back.  She is able to walk but says "it is just a little painful."  She has tried tylenol and this did not really help.  She denies radicular pain.  Complains of right knee locking also.  These pains come and go.    She complains of lip and throat pain along with white patches about the buccal mucosa. Recently completed a course of augmentin for suspected cellulitis.    Immunization History  Administered Date(s) Administered  . Influenza,inj,Quad PF,36+ Mos 04/01/2014, 06/28/2015, 04/28/2016  . Pneumococcal Conjugate-13 04/01/2014  . Pneumococcal Polysaccharide-23 08/14/2009  . Td 08/14/2009  . Zoster 08/14/2009       She is allergic to avastin [bevacizumab]; clindamycin/lincomycin; novocain [procaine]; and sulfa antibiotics.   She  has a past medical history of Anemia; Arthritis; Cataract; Clotting disorder ([place]); Deaf; DVT (deep venous thrombosis) ([place]); Edema; GERD (gastroesophageal reflux disease); Glaucoma; Hernia; Hiatal hernia; Hyperlipidemia; Hypertension; Osteoporosis; Sleep apnea; Thyroid disease; Ulcer; Urinary incontinence; and Uterine polyp.    She  reports that she has never smoked. She has never used smokeless tobacco. She reports that she does not drink alcohol or use drugs. She  reports that she does not currently engage in sexual activity. The patient  has a past surgical history that includes Cataract extraction;  goiter removed; xanthoma tumor removed; Refractive surgery; Dilatation & currettage/hysteroscopy with resectoscope (N/A, 10/11/2012); Eye surgery; Hernia repair; Tonsillectomy; and Hiatal hernia repair (N/A, 12/08/2013).  Her family history includes Cancer (age of onset: 59) in her mother; Heart disease in her brother, mother, and paternal grandmother; Heart disease (age of onset: 71) in her father; Hernia in her brother; Hyperlipidemia in her brother; Hypertension in her brother and mother; Migraines in her daughter; Stroke (age of onset: 81) in her mother.  Review of Systems  Constitutional: Negative for chills and fever.  Musculoskeletal: Positive for back pain, joint pain and myalgias. Negative for falls.  Neurological: Negative for dizziness.    The problem list and medications were reviewed and updated by myself where necessary and exist elsewhere in the encounter.   OBJECTIVE:  BP 132/80 (BP Location: Right Arm, Patient Position: Sitting, Cuff Size: Normal)   Pulse 63   Temp 99 F (37.2 C) (Oral)   Resp 17   Ht 5' 4.5" (1.638 m)   Wt 204 lb (92.5 kg)   SpO2 99%   BMI 34.48 kg/m   Physical Exam  Constitutional: She is oriented to person, place, and time. She appears well-developed and well-nourished. No distress.  HENT:  Nose: Nose normal.  Mouth/Throat: No oropharyngeal exudate.  Yeast buds noted about the tongue, buccal mucosa and lips.   Cardiovascular: Normal rate, regular rhythm and normal heart sounds.   Pulmonary/Chest: Effort normal and breath sounds normal.  Musculoskeletal:       Right knee: She exhibits decreased range of motion. She  exhibits no effusion. Tenderness found. Medial joint line and lateral joint line tenderness noted.  Neurological: She is alert and oriented to person, place, and time.  Skin: Skin is warm and dry. She is not diaphoretic.  Psychiatric: She has a normal mood and affect.    Lab Results  Component Value Date   NA 142 06/28/2015   K 3.9  06/28/2015   CL 105 06/28/2015   CO2 28 06/28/2015   Lab Results  Component Value Date   CREATININE 0.72 06/28/2015   Lab Results  Component Value Date   HGBA1C 5.7 03/09/2014   Lab Results  Component Value Date   WBC 4.0 06/28/2015   HGB 12.9 06/28/2015   HCT 38.8 06/28/2015   MCV 91.9 06/28/2015   PLT 194 06/28/2015   Lab Results  Component Value Date   ALT 15 06/28/2015   AST 25 06/28/2015   ALKPHOS 48 06/28/2015   BILITOT 0.5 06/28/2015   Procedure: Right knee sterile prep and drape.  Suprapatellar bursae injected with 1 cc of kenalog along with 4 cc of lidocaine.  Patient tolerated procedure without complaint.  There were not immediate complications.    No results found for this or any previous visit (from the past 72 hour(s)).  No results found.  ASSESSMENT AND PLAN  [name] was seen today for follow-up.  Diagnoses and all orders for this visit:  Special screening for malignant neoplasms, colon Comments: She can not schedule today and may be in [place] for quite some time.  She will call the office with a time that works for her so we can schedule.   Breast cancer screening: We will need to schedule this for her as well, preferably the same week as her colonoscopy.   Screening for metabolic disorder -     Hemoglobin A1c -     COMPLETE METABOLIC PANEL WITH GFR  Thyroid disorder screening -     TSH  Screening for deficiency anemia -     CBC  Primary osteoarthritis of right knee: Previous rads consistent with this diagnosis.  I have injected this today.  See procedure note.    Needs flu shot -     Cancel: Flu Vaccine QUAD 36+ mos IM  Oral candidiasis: Secondary to recent abx administration.       -     fluconazole (DIFLUCAN) 100 MG tablet; Take 1 tablet (100 mg total) by       mouth daily.  Need for prophylactic vaccination and inoculation against influenza -     Flu Vaccine QUAD 36+ mos PF IM (Fluarix & Fluzone Quad PF)      The patient is  advised to call or return to clinic if she does not see an improvement in symptoms, or to seek the care of the closest emergency department if she worsens with the above plan.   Philis Fendt, MHS, PA-C [facility] 04/28/2016 3:30 PM

## 2016-04-29 LAB — COMPLETE METABOLIC PANEL WITH GFR
ALT: 15 U/L (ref 6–29)
AST: 22 U/L (ref 10–35)
Albumin: 4.3 g/dL (ref 3.6–5.1)
Alkaline Phosphatase: 55 U/L (ref 33–130)
BUN: 14 mg/dL (ref 7–25)
CALCIUM: 9.7 mg/dL (ref 8.6–10.4)
CHLORIDE: 104 mmol/L (ref 98–110)
CO2: 26 mmol/L (ref 20–31)
CREATININE: 0.72 mg/dL (ref 0.60–0.93)
GFR, Est African American: >89 mL/min (ref >=60)
GFR, Est Non African American: 81 mL/min (ref >=60)
GLUCOSE: 95 mg/dL (ref 65–99)
Potassium: 4.2 mmol/L (ref 3.5–5.3)
SODIUM: 140 mmol/L (ref 135–146)
Total Bilirubin: 0.5 mg/dL (ref 0.2–1.2)
Total Protein: 7.4 g/dL (ref 6.1–8.1)

## 2016-04-29 LAB — HEMOGLOBIN A1C
HEMOGLOBIN A1C: 5.7 % — AB (ref <5.7)
Mean Plasma Glucose: 117 mg/dL

## 2016-05-15 ENCOUNTER — Other Ambulatory Visit: Payer: Self-pay | Admitting: Family Medicine

## 2016-05-16 NOTE — Telephone Encounter (Signed)
Rx for Lasix approved.

## 2016-05-16 NOTE — Telephone Encounter (Signed)
Dr Tamala Julian,   Do not see mention of this in the 9/17 visit.

## 2016-06-03 IMAGING — CR DG KNEE COMPLETE 4+V*R*
2 series · 2 of 2 positions shown · non-contrast
Comparison: 01/13/2013

CLINICAL DATA: Right knee pain, no known trauma

EXAM:
RIGHT KNEE - COMPLETE 4+ VIEW

[AP]
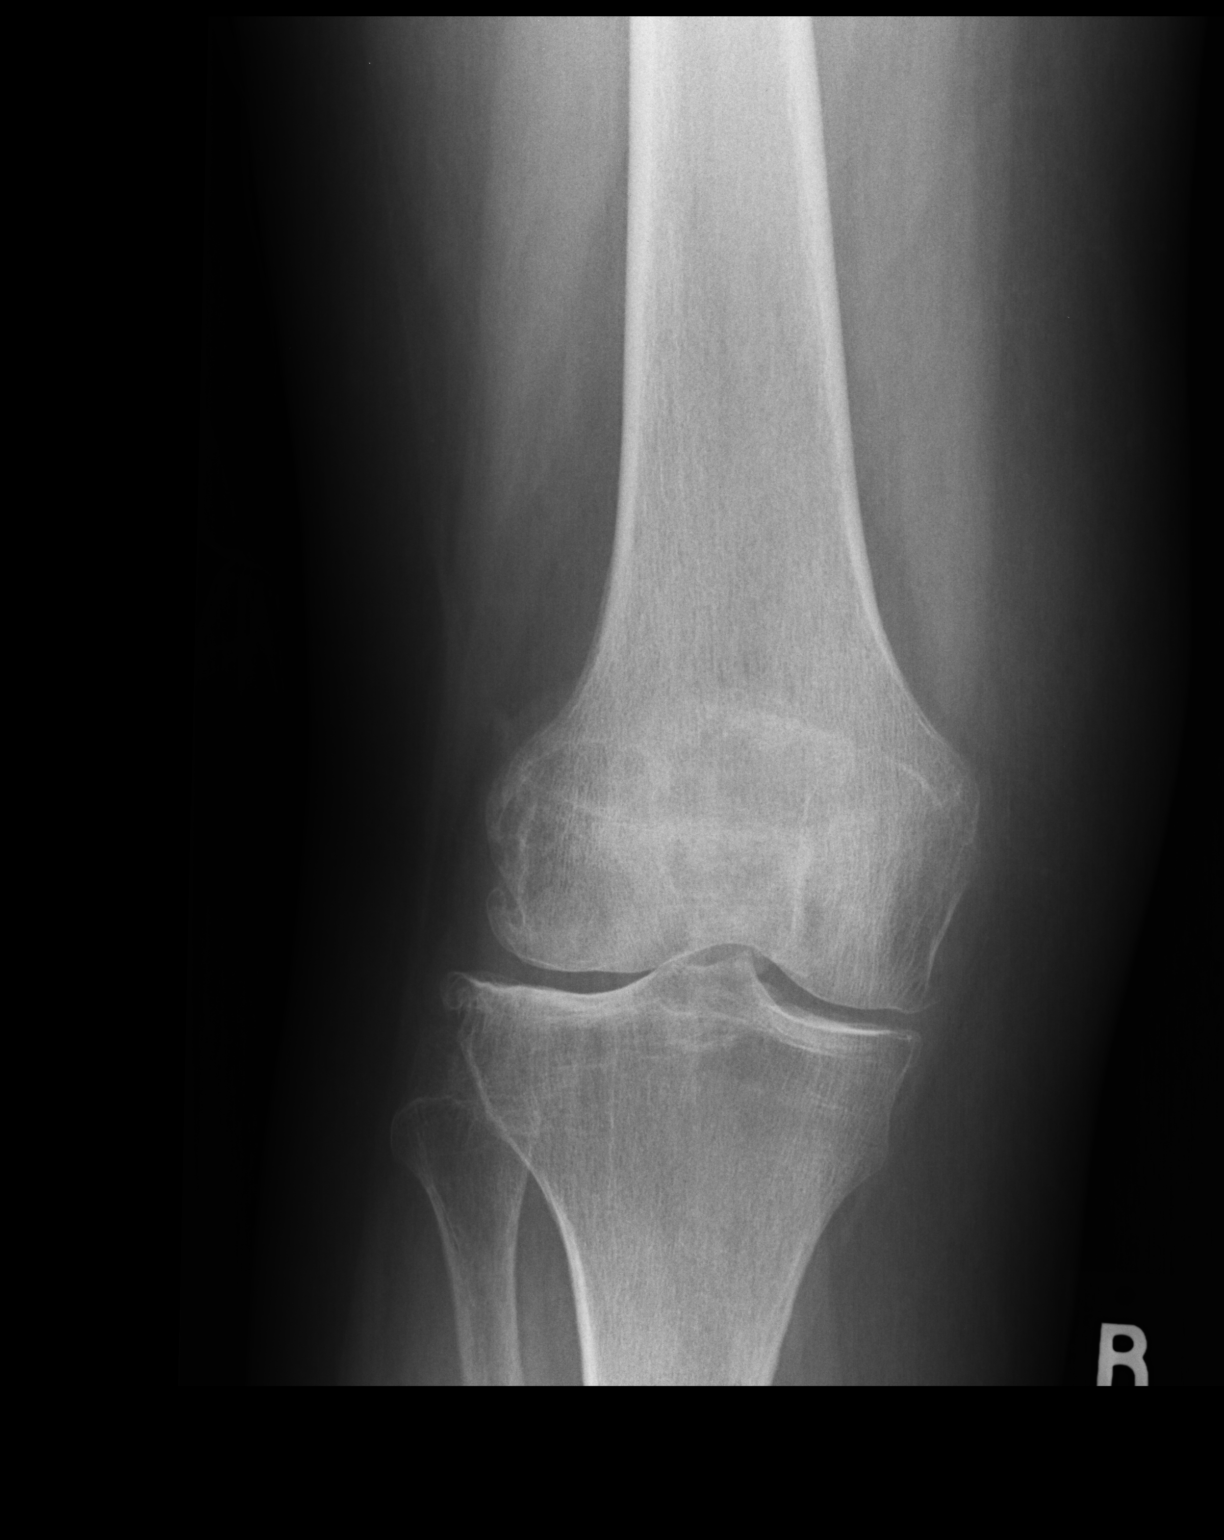

[xtable lateral]
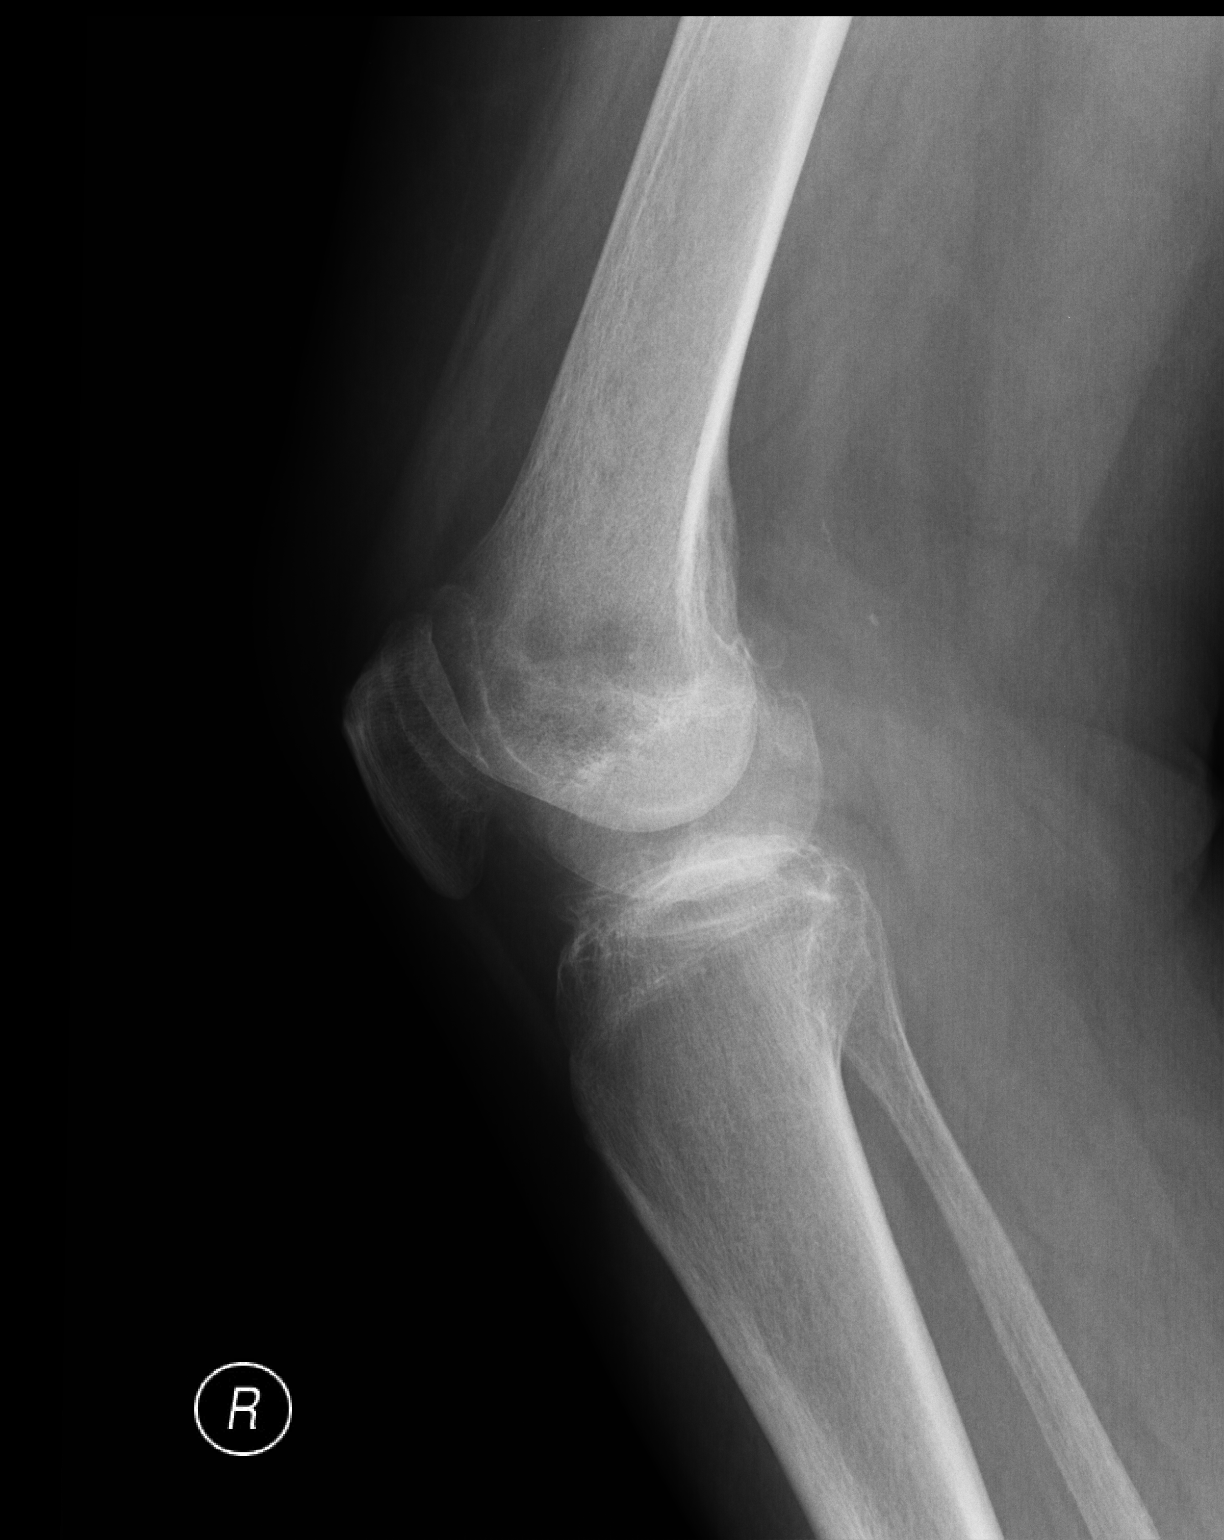

[2 of 2 positions shown; findings below may reference images not displayed]

FINDINGS: No fracture or dislocation. No radiopaque foreign body. No joint
effusion. Moderate tricompartmental degenerative change.
IMPRESSION: Negative.

## 2016-06-05 ENCOUNTER — Telehealth: Payer: Self-pay

## 2016-06-05 NOTE — Telephone Encounter (Signed)
Pharm called to report that zolpidem needs a PA for quantity limit. Ins # [id number], ID # [id number]. Completed PA on covermymeds and received approval through 06/05/17. Notified pharmacy.

## 2016-06-16 ENCOUNTER — Other Ambulatory Visit: Payer: Self-pay

## 2016-06-16 NOTE — Telephone Encounter (Signed)
Pharm reqs RFs on zolpidem. Last OV with Tamala Julian for insomnia 06/28/15, last OV with Wendi Snipes for insomnia 01/19/16. Pt's last Rx was written 4/27 for #30 + 5 RFs.

## 2016-06-19 MED ORDER — ZOLPIDEM TARTRATE 10 MG PO TABS: 10.0000 mg | ORAL_TABLET | Freq: Every day | ORAL | 5 refills | Status: DC

## 2016-06-19 NOTE — Telephone Encounter (Signed)
Ambien approved; please call into pharmacy as I am out of the office today.  Recent CPE by Philis Fendt in 04/2016.

## 2016-06-20 NOTE — Telephone Encounter (Signed)
rx called to pharmacy 

## 2016-07-19 ENCOUNTER — Other Ambulatory Visit: Payer: Self-pay | Admitting: Family Medicine

## 2016-07-26 ENCOUNTER — Other Ambulatory Visit: Payer: Self-pay | Admitting: Family Medicine

## 2016-08-22 ENCOUNTER — Other Ambulatory Visit: Payer: Self-pay | Admitting: Family Medicine

## 2016-08-27 ENCOUNTER — Other Ambulatory Visit: Payer: Self-pay | Admitting: Family Medicine

## 2016-08-30 NOTE — Telephone Encounter (Signed)
Last seen 04/2016

## 2016-09-07 ENCOUNTER — Other Ambulatory Visit: Payer: Self-pay | Admitting: Family Medicine

## 2016-09-08 ENCOUNTER — Other Ambulatory Visit: Payer: Self-pay | Admitting: Family Medicine

## 2016-09-11 ENCOUNTER — Other Ambulatory Visit: Payer: Self-pay | Admitting: Family Medicine

## 2016-09-12 NOTE — Telephone Encounter (Signed)
Please call pt to schedule an appt with Dr Tamala Julian within her 3 moth Rx refill that was given today

## 2016-09-13 ENCOUNTER — Telehealth: Payer: Self-pay

## 2016-09-13 MED ORDER — SIMVASTATIN 40 MG PO TABS: 40.0000 mg | ORAL_TABLET | Freq: Every day | ORAL | 0 refills | Status: DC

## 2016-09-13 NOTE — Telephone Encounter (Signed)
04/2016 last ov and labs 

## 2016-09-13 NOTE — Telephone Encounter (Signed)
[facility] [place] [facility] says they do not have a refill of Zocor. It was sent electronically 1/30. Please call pharmacy to ensure receipt of rx. Thanks!

## 2016-09-21 ENCOUNTER — Other Ambulatory Visit: Payer: Self-pay | Admitting: Physician Assistant

## 2016-10-11 ENCOUNTER — Other Ambulatory Visit: Payer: Self-pay | Admitting: Family Medicine

## 2016-10-12 NOTE — Telephone Encounter (Signed)
PCP listed is Dr. Zannie Cove, but most recent CPE was Mr. Melissa Mura, PA-C. Patient needs to schedule follow-up visit.   Left message for daughter of need for visit and refill authorization.  Meds ordered this encounter  Medications  . ramipril (ALTACE) 10 MG capsule    Sig: TAKE ONE CAPSULE BY MOUTH EVERY DAY    Dispense:  90 capsule    Refill:  0    PT WOULD LIKE BY THURS PLEASE  . cloNIDine (CATAPRES) 0.1 MG tablet    Sig: TAKE 1 TABLET BY MOUTH 3 TIMES A DAY    Dispense:  270 tablet    Refill:  0    PT WOULD LIKE BY THURS PLEASE

## 2016-10-14 ENCOUNTER — Other Ambulatory Visit: Payer: Self-pay | Admitting: Family Medicine

## 2016-10-20 ENCOUNTER — Other Ambulatory Visit: Payer: Self-pay | Admitting: Family Medicine

## 2016-11-16 ENCOUNTER — Other Ambulatory Visit: Payer: Self-pay | Admitting: Family Medicine

## 2016-12-14 ENCOUNTER — Other Ambulatory Visit: Payer: Self-pay | Admitting: Physician Assistant

## 2016-12-14 ENCOUNTER — Other Ambulatory Visit: Payer: Self-pay | Admitting: Family Medicine

## 2016-12-15 NOTE — Telephone Encounter (Signed)
[facility]

## 2016-12-15 NOTE — Telephone Encounter (Signed)
LMOM ON DAUGHTERS(SUZIE) PHONE [phone] BECAUSE PATIENT IS DEAF  DR Tamala Julian FILLED HER METPROLOL ON TIME ONLY SHE NEED AN OV FOR FURTHER REFILLS SHE NEED TO F/U ON HER BP LAST OV WAS 04/2016 PLEASE SCHEDULE

## 2016-12-15 NOTE — Telephone Encounter (Signed)
Call --- I approved a 30 day supply of Simvastatin only for patient as she is overdue for six month follow-up on high cholesterol.  Please schedule office visit in upcoming month with me at 104. She will need to be fasting for visit.

## 2016-12-15 NOTE — Telephone Encounter (Signed)
Call patient or her daughter --- I refilled a 30 day supply of Metoprolol for patient only as she is overdue for six month follow-up on high blood pressure; her last visit was in 04/2016.  She will need an appointment with me at 104 to receive further refills.  Please schedule office visit.

## 2016-12-15 NOTE — Telephone Encounter (Signed)
Please call in refill of Zolpidem as approved.

## 2016-12-15 NOTE — Telephone Encounter (Signed)
LMOM ON DAUGHTERS(SUZIE) PHONE [phone] BECAUSE PATIENT IS DEAF  DR Tamala Julian FILLED HER METPROLOL ON TIME ONLY SHE NEED AN OV FOR FURTHER REFILLS SHE NEED TO F/U ON HER BP LAST OV WAS 04/2016 PLEASE SCHEDULE

## 2017-01-09 ENCOUNTER — Other Ambulatory Visit: Payer: Self-pay | Admitting: Family Medicine

## 2017-01-16 ENCOUNTER — Other Ambulatory Visit: Payer: Self-pay | Admitting: Family Medicine

## 2017-01-19 ENCOUNTER — Other Ambulatory Visit: Payer: Self-pay | Admitting: Family Medicine

## 2017-01-19 DIAGNOSIS — R3915 Urgency of urination: Secondary | ICD-10-CM | POA: Diagnosis not present

## 2017-01-19 DIAGNOSIS — E785 Hyperlipidemia, unspecified: Secondary | ICD-10-CM | POA: Diagnosis not present

## 2017-01-19 DIAGNOSIS — G4700 Insomnia, unspecified: Secondary | ICD-10-CM | POA: Diagnosis not present

## 2017-01-19 DIAGNOSIS — I10 Essential (primary) hypertension: Secondary | ICD-10-CM | POA: Diagnosis not present

## 2017-01-22 DIAGNOSIS — N390 Urinary tract infection, site not specified: Secondary | ICD-10-CM | POA: Diagnosis not present

## 2017-01-25 DIAGNOSIS — H5213 Myopia, bilateral: Secondary | ICD-10-CM | POA: Diagnosis not present

## 2017-01-25 DIAGNOSIS — H401131 Primary open-angle glaucoma, bilateral, mild stage: Secondary | ICD-10-CM | POA: Diagnosis not present

## 2017-01-28 DIAGNOSIS — L989 Disorder of the skin and subcutaneous tissue, unspecified: Secondary | ICD-10-CM | POA: Diagnosis not present

## 2017-02-02 ENCOUNTER — Other Ambulatory Visit: Payer: Self-pay | Admitting: Physician Assistant

## 2017-02-19 ENCOUNTER — Ambulatory Visit: Payer: Medicare Other | Admitting: Internal Medicine

## 2017-02-19 VITALS — BP 136/64 | HR 48 | Temp 98.0°F | Ht 64.0 in | Wt 203.7 lb

## 2017-02-19 DIAGNOSIS — I10 Essential (primary) hypertension: Secondary | ICD-10-CM | POA: Diagnosis not present

## 2017-02-19 DIAGNOSIS — G4700 Insomnia, unspecified: Secondary | ICD-10-CM | POA: Diagnosis not present

## 2017-02-19 DIAGNOSIS — N3946 Mixed incontinence: Secondary | ICD-10-CM | POA: Diagnosis not present

## 2017-02-19 DIAGNOSIS — H9193 Unspecified hearing loss, bilateral: Secondary | ICD-10-CM | POA: Diagnosis not present

## 2017-02-19 DIAGNOSIS — E7800 Pure hypercholesterolemia, unspecified: Secondary | ICD-10-CM | POA: Diagnosis not present

## 2017-02-19 MED ORDER — SIMVASTATIN 40 MG TABLET
40.0000 mg | ORAL_TABLET | Freq: Every day | ORAL | 0 refills | Status: DC
Start: 2017-02-19 — End: 2017-04-20

## 2017-02-19 MED ORDER — METOPROLOL TARTRATE 50 MG TABLET
1.0000 | ORAL_TABLET | Freq: Two times a day (BID) | ORAL | 0 refills | Status: DC
Start: 2017-02-19 — End: 2017-04-20

## 2017-02-19 MED ORDER — CLONIDINE HCL 0.1 MG TABLET
0.1000 mg | ORAL_TABLET | Freq: Three times a day (TID) | ORAL | 0 refills | Status: DC
Start: 2017-02-19 — End: 2017-04-20

## 2017-02-19 MED ORDER — TOLTERODINE ER 4 MG CAPSULE,EXTENDED RELEASE 24 HR
4.0000 mg | EXTENDED_RELEASE_CAPSULE | Freq: Every day | ORAL | 0 refills | Status: DC
Start: 2017-02-19 — End: 2017-04-20

## 2017-02-19 MED ORDER — FUROSEMIDE 20 MG TABLET
20.0000 mg | ORAL_TABLET | Freq: Every day | ORAL | 0 refills | Status: DC
Start: 2017-02-19 — End: 2017-04-20

## 2017-02-19 MED ORDER — AMLODIPINE 2.5 MG TABLET
2.5000 mg | ORAL_TABLET | Freq: Every day | ORAL | 0 refills | Status: DC
Start: 2017-02-19 — End: 2017-04-20

## 2017-02-19 MED ORDER — RAMIPRIL 10 MG CAPSULE
1.0000 | ORAL_CAPSULE | Freq: Every day | ORAL | 0 refills | Status: DC
Start: 2017-02-19 — End: 2017-04-20

## 2017-02-19 MED ORDER — ZOLPIDEM 10 MG TABLET
10.0000 mg | ORAL_TABLET | Freq: Every day | ORAL | 0 refills | Status: DC | PRN
Start: 2017-02-19 — End: 2017-04-20

## 2017-02-19 NOTE — Nursing Note (Signed)
ID and DOB verified. VS taken, screened for pain, verified allergies and pharmacy.  Nieves Barberi [name], Sr. LVN

## 2017-02-19 NOTE — Progress Notes (Signed)
Chief Complaint   Patient presents with    Medication Follow Up    Refill Request     all meds 90 days supply       Subjective: Melinda [name] is a(n) [age]yr old female who presents for follow up of hypertension.    Current symptoms are none.  She has been treated with meds for many year(s), and states she takes medications regularly. Side effects noted are none; which are not applicable.      Patient has not been checking ambulatory blood pressure.  H/o Hypercholesterolemia   She needs all her meds refilled    ROS:  Constitutional: negative.  Eyes: negative.  Ears, Nose, Mouth, Throat: negative.  CV: negative.  Resp: negative.    History:  Moved to [place] 2012, visiting family here    Objective:    General Appearance: healthy, alert, no distress, pleasant affect, cooperative. Sign interpretor present  Eyes:  conjunctivae and corneas clear. PERRL, EOM's intact. sclerae normal.  Ears:  normal TMs and canal and external inspection of ears show no abnormality.  Nose:  normal.  Mouth: normal.    Assessment: Essential hypertension adequately controlled.  See Diagnoses Section.    Plan: See Orders.  1)  Medication: continue current medication regimen unchanged  2)  Dietary sodium restriction  3)  Regular aerobic exercise    Patient Instruction:  Reviewed risks of hypertension and principles of treatment.    See Patient Education and Orders Sections.      (I10) Essential hypertension  (primary encounter diagnosis)  Comment:   Plan: Ramipril (ALTACE) 10 mg Capsule, Metoprolol         Tartrate (LOPRESSOR) 50 mg Tablet, Amlodipine         (NORVASC) 2.5 mg Tablet, CloNIDine (CATAPRES)         0.1 mg Tablet, FUROsemide (LASIX) 20 mg Tablet            (E78.00) High cholesterol  Comment:   Plan: Simvastatin (ZOCOR) 40 mg Tablet            (G47.00) Insomnia, unspecified type  Comment:   Plan: Zolpidem (AMBIEN) 10 mg Tablet            (H91.93) Bilateral hearing loss, unspecified hearing loss type  Comment:   Plan:      (N39.46) Mixed incontinence urge and stress  Comment:   Plan: Tolterodine (DETROL LA) 4 mg SR Capsule             Barriers to Learning assessed: none. Patient verbalizes understanding of teaching and instructions.    Electronically Signed By:  Judith BlonderJohn Siyon Linck, MD  Associate Physician  [place] [facility] [facility], [address]  [phone]

## 2017-04-10 ENCOUNTER — Emergency Department
Admission: RE | Admit: 2017-04-10 | Discharge: 2017-04-10 | Disposition: A | Discharge status: Home / Self Care | Payer: Medicare Other | Attending: Emergency Medicine | Admitting: Emergency Medicine

## 2017-04-10 ENCOUNTER — Other Ambulatory Visit: Payer: Self-pay | Admitting: Internal Medicine

## 2017-04-10 ENCOUNTER — Telehealth: Payer: Self-pay | Admitting: Internal Medicine

## 2017-04-10 DIAGNOSIS — Z86718 Personal history of other venous thrombosis and embolism: Secondary | ICD-10-CM | POA: Diagnosis not present

## 2017-04-10 DIAGNOSIS — S51852A Open bite of left forearm, initial encounter: Secondary | ICD-10-CM | POA: Diagnosis not present

## 2017-04-10 DIAGNOSIS — Z888 Allergy status to other drugs, medicaments and biological substances status: Secondary | ICD-10-CM | POA: Diagnosis not present

## 2017-04-10 DIAGNOSIS — Z882 Allergy status to sulfonamides status: Secondary | ICD-10-CM | POA: Diagnosis not present

## 2017-04-10 DIAGNOSIS — S50812A Abrasion of left forearm, initial encounter: Secondary | ICD-10-CM | POA: Diagnosis not present

## 2017-04-10 DIAGNOSIS — I10 Essential (primary) hypertension: Secondary | ICD-10-CM | POA: Diagnosis not present

## 2017-04-10 DIAGNOSIS — Z881 Allergy status to other antibiotic agents status: Secondary | ICD-10-CM | POA: Diagnosis not present

## 2017-04-10 DIAGNOSIS — W548XXA Other contact with dog, initial encounter: Secondary | ICD-10-CM | POA: Insufficient documentation

## 2017-04-10 DIAGNOSIS — G4700 Insomnia, unspecified: Secondary | ICD-10-CM

## 2017-04-10 DIAGNOSIS — W540XXA Bitten by dog, initial encounter: Secondary | ICD-10-CM

## 2017-04-10 MED ORDER — CEPHALEXIN 500 MG CAPSULE
500.0000 mg | ORAL_CAPSULE | Freq: Once | ORAL | Status: AC
Start: 2017-04-10 — End: 2017-04-10
  Administered 2017-04-10: 500 mg via ORAL
  Filled 2017-04-10: qty 1

## 2017-04-10 MED ORDER — CEPHALEXIN 500 MG CAPSULE
500.0000 mg | ORAL_CAPSULE | Freq: Two times a day (BID) | ORAL | 0 refills | Status: AC
Start: 2017-04-10 — End: 2017-04-17

## 2017-04-10 NOTE — ED Nursing Note (Signed)
Patient discharged from ED with AVS, Rx, related instructions and all belongings. Patient is in NAD, is awake/alert skin, is pink/warm/dry. Pt leaves the ED ambulatory with family. Written down education for pt to read due to verbal deficit, nods head to understanding, d/c home with husband

## 2017-04-10 NOTE — ED Initial Note (Signed)
EMERGENCY DEPARTMENT PHYSICIAN NOTE - [facility]       Date of Service:   04/10/2017 10:42 PM Patient's PCP: Shelda Jakes Hosoume   Note Started: 04/10/2017 22:43 DOB: [birth year]             Chief Complaint   Patient presents with    Rash Stable           The history provided by the patient.  Interpreter used: No    LURLIE [name] is a 81yr old female, with a past medical history significant for deafness, migraine, hypothyroidism, HTN and hx of DVT, who presents to the ED with a chief complaint of rash that began 4 days ago s/p scratched by dog to L forearm.  Now with itchy rash to forearm and low back.    No fever/chills, nausea/vomiting  +generalized weakness    Initial wound did have yellow discharge from the wound; now with bruising    Tetanus a "couple of years ago"     A full history, including pertinent past medical and social history was reviewed and updated as necessary.    HISTORY:  1    *Dog scratch     Allergies   Allergen Reactions    Avastin [Bevacizumab] Other-Reaction in Comments     PER PATIENT, EYE PRESSURE WENT UP TO 90    Clindamycin Rash    Novocain [Procaine Hcl] Syncope and Seizures    Sulfa (Sulfonamide Antibiotics) Rash and Fever      Past Medical History:  No date: Abnormal coagulation profile  No date: DVT  No date: FH: migraines  No date: Glaucoma  No date: Hypothyroid  No date: Osteoporosis  No date: Personal history of peptic ulcer disease  No date: Personal history of venous thrombosis and embo*  No date: Unspecified circulatory system disorder  No date: Unspecified essential hypertension Past Surgical History:  04/2005: Colonoscopy  1989: Pr lap,hernia repair proc,unlist  2004: Pr remv 2nd cataract,corn-scler sectn  No date: Pr surgery of breast capsule  No date: Pr thyroidectomy   Social History    Marital status: MARRIED             Spouse name: Dennard Nip                Years of education:                 Number of children: 3             Occupational History  Occupation           Associate Professor            Comment               retired, Immunologist*                         Social History Main Topics    Smoking status: Never Smoker                                                                Smokeless status: Never Used                        Alcohol use: No  Drug use: No              Sexual activity: Yes               Partners with: Female    Other Topics            Concern    None on file    Social History Narrative    None on file     Review of patient's family history indicates:    Heart                          Father                      Comment: MI    Hypertension                   Brother                     Comment: MI    Cancer                         Mother                    Stroke                         Mother                      Comment: multiple    Hypertension                   Mother                    Stroke                         Maternal Grandmother               Review of Systems   Constitutional: Negative for chills and fever.   Respiratory: Negative for cough and shortness of breath.    Gastrointestinal: Negative for nausea and vomiting.   Musculoskeletal: Negative for neck pain and neck stiffness.   Skin: Positive for color change, rash and wound.   Neurological: Positive for weakness. Negative for seizures.   Psychiatric/Behavioral: Negative for agitation and confusion.       TRIAGE VITAL SIGNS:  Temp: 36.2 C (97.2 F) (04/10/17 2235)  Temp src: (not recorded)  Pulse: 62 (04/10/17 2235)  BP: 148/66 (04/10/17 2235)  Resp: 20 (04/10/17 2235)  SpO2: 95 % (04/10/17 2235)  Weight: 95.3 kg (210 lb) (04/10/17 2235)    Physical Exam   Constitutional: She is oriented to person, place, and time. She appears well-developed and well-nourished. No distress.   HENT:   Head: Normocephalic and atraumatic.   Eyes: Conjunctivae and EOM are normal.   Cardiovascular: Intact distal pulses.    Pulmonary/Chest: Effort normal. No respiratory distress.   Musculoskeletal: Normal  range of motion.   Neurological: She is alert and oriented to person, place, and time.   Skin: Skin is warm and dry.        Psychiatric: She has a normal mood and affect. Her behavior is normal.   Nursing note and vitals reviewed.        INITIAL ASSESSMENT & PLAN, MEDICAL DECISION  MAKING, ED COURSE  Melinda [name] is a 80yr female who presents with a chief complaint of   Chief Complaint   Patient presents with    Rash Stable    .     Differential includes, but is not limited to: cellulitis, abscess, allergy       The results of the ED evaluation were notable for the following:    Medications   Cephalexin (KEFLEX) Capsule 500 mg (500 mg ORAL Given 04/10/17 2327)         Chart Review: I reviewed the patient's prior medical records. Pertinent information that is relevant to this encounter n/a.      Patient Summary: Melinda [name] is a 81yr old female with rash after dog scratch.  No obvious cellulitis; but will start Keflex and f/u with PCP.  Discharge home in stable condition.           LAST VITAL SIGNS:  Temp: 36.2 C (97.2 F) (04/10/17 2235)  Temp src: (not recorded)  Pulse: 62 (04/10/17 2235)  BP: 148/66 (04/10/17 2235)  Resp: 20 (04/10/17 2235)  SpO2: 95 % (04/10/17 2235)  Weight: 95.3 kg (210 lb) (04/10/17 2235)      Clinical Impression:     ICD-10-CM    1. Dog scratch W54.8XXA                  Disposition: Discharge. Follow up with PCP. ED discharge instructions were reviewed and provided.      PATIENT'S GENERAL CONDITION:  Good: Vital signs are stable and within normal limits. Patient is conscious and comfortable. Indicators are excellent.     Electronically signed by: Gearldine Bienenstock, MD

## 2017-04-10 NOTE — Telephone Encounter (Signed)
Patient calling in via Video relay services w/ Reymundo Poll agent : [phone]    Patient requesting ambien refill. Not sure when it was last filled

## 2017-04-10 NOTE — ED Nursing Note (Signed)
MD Jagdeo at BS.

## 2017-04-10 NOTE — ED Triage Note (Signed)
Approximately 3 days ago patients dog scratched her left forearm. Patient how has a rash on the left forearm and the left flank area. Patients physician told her to come to the emergency department for evaluation

## 2017-04-10 NOTE — Telephone Encounter (Signed)
Patient called in via [facility] video relayed services.     Patient calling in to request appointment to be seen for "dog scratch" on her arm.    Patient denies fever, chills, vomitting, shaking, diarrhea, or any red streaking.    Patient says the scratch is warm to the touch, has yellow puss, and red spots that are spreading on her arm.    Spoke to [place] in triage and was advised to inform patient that they should be seen today in either Urgent care or nearest ER. Patient declined [facility] and says her husband will take her to the ER.

## 2017-04-10 NOTE — Telephone Encounter (Signed)
rx ambien 10 mg #90 on 02/19/17.  Did she pick this up?  If she did pick this up then not due until 05/22/17

## 2017-04-11 NOTE — Telephone Encounter (Signed)
rx ambien denied, due 10/7

## 2017-04-11 NOTE — Telephone Encounter (Signed)
Call to Pharmacy and spoke with tech who stated the Last fill was on 04/05/17 for 45 tabs.

## 2017-04-19 ENCOUNTER — Ambulatory Visit: Payer: Medicare Other | Admitting: Internal Medicine

## 2017-04-20 ENCOUNTER — Ambulatory Visit: Payer: Medicare Other | Admitting: Internal Medicine

## 2017-04-20 VITALS — BP 131/78 | HR 58 | Temp 98.3°F | Ht 64.0 in | Wt 203.0 lb

## 2017-04-20 DIAGNOSIS — I10 Essential (primary) hypertension: Secondary | ICD-10-CM | POA: Diagnosis not present

## 2017-04-20 DIAGNOSIS — G4700 Insomnia, unspecified: Secondary | ICD-10-CM | POA: Diagnosis not present

## 2017-04-20 DIAGNOSIS — N3946 Mixed incontinence: Secondary | ICD-10-CM | POA: Diagnosis not present

## 2017-04-20 DIAGNOSIS — I872 Venous insufficiency (chronic) (peripheral): Secondary | ICD-10-CM | POA: Diagnosis not present

## 2017-04-20 DIAGNOSIS — E7800 Pure hypercholesterolemia, unspecified: Secondary | ICD-10-CM | POA: Diagnosis not present

## 2017-04-20 MED ORDER — CLONIDINE HCL 0.1 MG TABLET
0.1000 mg | ORAL_TABLET | Freq: Three times a day (TID) | ORAL | 3 refills | Status: AC
Start: 2017-04-20 — End: 2018-04-20

## 2017-04-20 MED ORDER — SIMVASTATIN 40 MG TABLET
40.0000 mg | ORAL_TABLET | Freq: Every day | ORAL | 3 refills | Status: DC
Start: 2017-04-20 — End: 2018-05-12

## 2017-04-20 MED ORDER — ASPIRIN 81 MG CHEWABLE TABLET
81.0000 mg | CHEWABLE_TABLET | Freq: Every day | ORAL | 3 refills | Status: AC
Start: 2017-04-20 — End: 2018-04-15

## 2017-04-20 MED ORDER — FUROSEMIDE 20 MG TABLET
20.0000 mg | ORAL_TABLET | Freq: Every day | ORAL | 3 refills | Status: AC
Start: 2017-04-20 — End: 2018-04-20

## 2017-04-20 MED ORDER — METOPROLOL TARTRATE 50 MG TABLET
50.0000 mg | ORAL_TABLET | Freq: Two times a day (BID) | ORAL | 3 refills | Status: AC
Start: 2017-04-20 — End: 2018-05-20

## 2017-04-20 MED ORDER — CALCIUM 600 MG (AS CARBONATE)-VIT D3 10 MCG (400 UNIT)-MINERALS TABLET
1.0000 | ORAL_TABLET | Freq: Every day | ORAL | 3 refills | Status: AC
Start: 2017-04-20 — End: 2018-04-20

## 2017-04-20 MED ORDER — ZOLPIDEM 10 MG TABLET
10.0000 mg | ORAL_TABLET | Freq: Every day | ORAL | 0 refills | Status: DC | PRN
Start: 2017-04-20 — End: 2017-05-18

## 2017-04-20 MED ORDER — RAMIPRIL 10 MG CAPSULE
10.0000 mg | ORAL_CAPSULE | Freq: Every day | ORAL | 3 refills | Status: AC
Start: 2017-04-20 — End: 2018-04-15

## 2017-04-20 NOTE — Nursing Note (Signed)
Vital signs taken, allergies verified, screened for pain, pharmacy verified.    Sydnee Lamour, MA

## 2017-04-20 NOTE — Progress Notes (Signed)
Impression/Plan for Conard Novakamela L Powless on 04/20/2017     1. Essential hypertension  Refilled most; but can discontinue amlodipine.   - CloNIDine (CATAPRES) 0.1 mg Tablet; Take 1 tablet by mouth 3 times daily.  Dispense: 270 tablet; Refill: 3  - FUROsemide (LASIX) 20 mg Tablet; Take 1 tablet by mouth every morning. (water pill)  Dispense: 90 tablet; Refill: 3  - Metoprolol Tartrate (LOPRESSOR) 50 mg Tablet; Take 1 tablet by mouth 2 times daily.  Dispense: 180 tablet; Refill: 3  - Ramipril (ALTACE) 10 mg Capsule; Take 1 capsule by mouth every morning.  Dispense: 90 capsule; Refill: 3    2. High cholesterol  Refilled. Labs up to date.   - Simvastatin (ZOCOR) 40 mg Tablet; Take 1 tablet by mouth every day at bedtime. (cholesterol)  Dispense: 90 tablet; Refill: 3    3. Mixed incontinence urge and stress  Medication not effective; discontinue     4. Insomnia, unspecified type  Refill- has tolerated for decades.   - Zolpidem (AMBIEN) 10 mg Tablet; Take 1 tablet by mouth every day at bedtime if needed.  Dispense: 90 tablet; Refill: 0    5. Venous insufficiency  Elevate and order new hose  - Durable Medical Equipment            Melinda [name] is [age]yr yo here for Medication Follow Up and Stiffness (legs)        here for refills on her blood pressure and other medication.   Basically doing well; blood pressure under better control here than her other home in [place].     Other issue is her longstanding edema in both legs. Has progressed gradually over many years.   Not associated with dyspnea on exertion, paroxysmal nocturnal dyspnea.   Has normal renal function, no history of thyroid disease.   Edema resolved by morning. No associated rash.   Compression hose had helped in the past, would like new ones.     Also concerned about polypharmacy: has been off amlodipine for a few months. detrol hasn't helped bladder symptoms at all.       Review of Systems   Respiratory: Negative for shortness of breath.    Cardiovascular: Positive for leg  swelling. Negative for chest pain.       Physical Exam   Cardiovascular: Normal rate and regular rhythm.    Pulmonary/Chest: Breath sounds normal.   Musculoskeletal: She exhibits edema (3+ pitting bilateral edema. ).         I did review patient's past medical and family/social history, no changes noted.  Barriers to Learning assessed: none. She verbalizes understanding of and agrees with plan of care.         Ezzard FlaxScott Qusai Kem, MD FACP  Associate Physician- PCN- [facility]

## 2017-05-18 ENCOUNTER — Other Ambulatory Visit: Payer: Self-pay | Admitting: Internal Medicine

## 2017-05-18 DIAGNOSIS — G4700 Insomnia, unspecified: Principal | ICD-10-CM

## 2017-05-18 MED ORDER — ZOLPIDEM 10 MG TABLET
10.0000 mg | ORAL_TABLET | Freq: Every day | ORAL | 0 refills | Status: DC | PRN
Start: 2017-05-18 — End: 2017-09-07

## 2017-05-18 NOTE — Telephone Encounter (Signed)
Pt is completely out of this medication.  Thank you           Tery Sanfilippo. [facility]  Medical Office Service Coordinator   [facility]   [phone]

## 2017-05-18 NOTE — Telephone Encounter (Signed)
CURES checked and no aberrancy noted.

## 2017-05-25 ENCOUNTER — Ambulatory Visit (INDEPENDENT_AMBULATORY_CARE_PROVIDER_SITE_OTHER): Payer: Medicare Other | Admitting: Physician Assistant

## 2017-05-25 ENCOUNTER — Encounter: Payer: Self-pay | Admitting: Physician Assistant

## 2017-05-25 VITALS — HR 52 | Temp 98.7°F | Resp 16 | Ht 64.0 in | Wt 203.8 lb

## 2017-05-25 DIAGNOSIS — K219 Gastro-esophageal reflux disease without esophagitis: Secondary | ICD-10-CM | POA: Diagnosis not present

## 2017-05-25 DIAGNOSIS — E785 Hyperlipidemia, unspecified: Secondary | ICD-10-CM | POA: Diagnosis not present

## 2017-05-25 DIAGNOSIS — Z13820 Encounter for screening for osteoporosis: Secondary | ICD-10-CM

## 2017-05-25 DIAGNOSIS — M8580 Other specified disorders of bone density and structure, unspecified site: Secondary | ICD-10-CM

## 2017-05-25 DIAGNOSIS — Z23 Encounter for immunization: Secondary | ICD-10-CM

## 2017-05-25 DIAGNOSIS — Z1211 Encounter for screening for malignant neoplasm of colon: Secondary | ICD-10-CM | POA: Diagnosis not present

## 2017-05-25 DIAGNOSIS — I10 Essential (primary) hypertension: Secondary | ICD-10-CM

## 2017-05-25 DIAGNOSIS — Z0000 Encounter for general adult medical examination without abnormal findings: Secondary | ICD-10-CM

## 2017-05-25 DIAGNOSIS — R7303 Prediabetes: Secondary | ICD-10-CM

## 2017-05-25 MED ORDER — AMLODIPINE BESYLATE 2.5 MG PO TABS: 2.5000 mg | ORAL_TABLET | Freq: Every day | ORAL | 3 refills | Status: DC

## 2017-05-25 MED ORDER — ATENOLOL-CHLORTHALIDONE 50-25 MG PO TABS: 1.0000 | ORAL_TABLET | Freq: Every day | ORAL | 3 refills | Status: DC

## 2017-05-25 MED ORDER — RANITIDINE HCL 75 MG PO TABS: 75.0000 mg | ORAL_TABLET | Freq: Two times a day (BID) | ORAL | 0 refills | Status: DC

## 2017-05-25 MED ORDER — SIMVASTATIN 40 MG PO TABS: 40.0000 mg | ORAL_TABLET | Freq: Every day | ORAL | 3 refills | Status: DC

## 2017-05-25 MED ORDER — ALENDRONATE SODIUM 70 MG PO TABS: 70.0000 mg | ORAL_TABLET | ORAL | 3 refills | Status: DC
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
## 2017-05-25 NOTE — Patient Instructions (Addendum)
Take a Zantac 75 mg as needed for GERD.    Take only the medications on your list as of today.

## 2017-05-27 LAB — CMP AND LIVER
ALBUMIN: 4.5 g/dL (ref 3.5–4.8)
ALT: 14 IU/L (ref 0–32)
AST: 23 IU/L (ref 0–40)
Alkaline Phosphatase: 80 IU/L (ref 39–117)
BILIRUBIN, DIRECT: 0.08 mg/dL (ref 0.00–0.40)
BUN: 18 mg/dL (ref 8–27)
Bilirubin Total: 0.2 mg/dL (ref 0.0–1.2)
CALCIUM: 9.7 mg/dL (ref 8.7–10.3)
CO2: 23 mmol/L (ref 20–29)
CREATININE: 0.8 mg/dL (ref 0.57–1.00)
Chloride: 100 mmol/L (ref 96–106)
GFR calc non Af Amer: 71 mL/min/{1.73_m2} (ref >59)
GFR, EST AFRICAN AMERICAN: 82 mL/min/{1.73_m2} (ref >59)
Glucose: 84 mg/dL (ref 65–99)
Potassium: 4.4 mmol/L (ref 3.5–5.2)
SODIUM: 142 mmol/L (ref 134–144)
TOTAL PROTEIN: 7.4 g/dL (ref 6.0–8.5)

## 2017-05-27 LAB — CBC
HEMATOCRIT: 40 % (ref 34.0–46.6)
Hemoglobin: 13.4 g/dL (ref 11.1–15.9)
MCH: 31 pg (ref 26.6–33.0)
MCHC: 33.5 g/dL (ref 31.5–35.7)
MCV: 93 fL (ref 79–97)
Platelets: 221 10*3/uL (ref 150–379)
RBC: 4.32 x10E6/uL (ref 3.77–5.28)
RDW: 13.9 % (ref 12.3–15.4)
WBC: 6.3 10*3/uL (ref 3.4–10.8)

## 2017-05-27 LAB — LIPID PANEL
CHOL/HDL RATIO: 4.3 ratio (ref 0.0–4.4)
Cholesterol, Total: 200 mg/dL — ABNORMAL HIGH (ref 100–199)
HDL: 47 mg/dL (ref >39)
LDL Calculated: 106 mg/dL — ABNORMAL HIGH (ref 0–99)
Triglycerides: 236 mg/dL — ABNORMAL HIGH (ref 0–149)
VLDL Cholesterol Cal: 47 mg/dL — ABNORMAL HIGH (ref 5–40)

## 2017-05-27 LAB — HEMOGLOBIN A1C
ESTIMATED AVERAGE GLUCOSE: 120 mg/dL
HEMOGLOBIN A1C: 5.8 % — AB (ref 4.8–5.6)

## 2017-05-28 ENCOUNTER — Encounter: Payer: Self-pay | Admitting: Gastroenterology

## 2017-05-28 ENCOUNTER — Ambulatory Visit (INDEPENDENT_AMBULATORY_CARE_PROVIDER_SITE_OTHER): Payer: Medicare Other | Admitting: Physician Assistant

## 2017-05-28 VITALS — BP 148/100 | Resp 18 | Ht 64.0 in | Wt 202.2 lb

## 2017-05-28 DIAGNOSIS — M25561 Pain in right knee: Secondary | ICD-10-CM | POA: Diagnosis not present

## 2017-05-28 NOTE — Progress Notes (Signed)
05/29/2017 5:25 PM   DOB: 02/08/1936 / MRN: [id number]  SUBJECTIVE:  Melissa Murphy is a 81 y.o. female presenting for follow up of medication changes.  See my last note.  She was on an increasingly complicated regimen. I stopped this and added multiple once daily medications.  She tells me that she has not started the new regimen yet as she was unable to go to pharmacy yet.   She tells me that her right knee has been increasing painful, particularly with ambulation.  I have injected her before and she is requesting that I inject this again. She has a history of tricompartmental OA in both knees.   She is allergic to avastin [bevacizumab]; clindamycin/lincomycin; novocain [procaine]; and sulfa antibiotics.   She  has a past medical history of Anemia; Arthritis; Cataract; Clotting disorder ([place]); Deaf; DVT (deep venous thrombosis) ([place]); Edema; GERD (gastroesophageal reflux disease); Glaucoma; Hernia; Hiatal hernia; Hyperlipidemia; Hypertension; Osteoporosis; Sleep apnea; Thyroid disease; Ulcer; Urinary incontinence; and Uterine polyp.    She  reports that she has never smoked. She has never used smokeless tobacco. She reports that she does not drink alcohol or use drugs. She  reports that she does not currently engage in sexual activity. The patient  has a past surgical history that includes Cataract extraction; goiter removed; xanthoma tumor removed; Refractive surgery; Dilatation & currettage/hysteroscopy with resectoscope (N/A, 10/11/2012); Eye surgery; Hernia repair; Tonsillectomy; and Hiatal hernia repair (N/A, 12/08/2013).  Her family history includes Cancer (age of onset: 27) in her mother; Heart disease in her brother, mother, and paternal grandmother; Heart disease (age of onset: 82) in her father; Hernia in her brother; Hyperlipidemia in her brother; Hypertension in her brother and mother; Migraines in her daughter; Stroke (age of onset: 95) in her mother.  Review of Systems    Constitutional: Negative for chills, diaphoresis and fever.  Respiratory: Negative for cough, hemoptysis, sputum production, shortness of breath and wheezing.   Cardiovascular: Negative for chest pain, orthopnea and leg swelling.  Gastrointestinal: Negative for nausea.  Musculoskeletal: Positive for joint pain.  Skin: Negative for rash.  Neurological: Negative for dizziness.    The problem list and medications were reviewed and updated by myself where necessary and exist elsewhere in the encounter.   OBJECTIVE:  BP (!) 148/100 (BP Location: Left Arm, Patient Position: Sitting, Cuff Size: Large)   Resp 18   Ht 5\' 4"  (1.626 m)   Wt 202 lb 3.2 oz (91.7 kg)   BMI 34.71 kg/m   Physical Exam  Pulmonary/Chest: Effort normal and breath sounds normal.  Musculoskeletal: Normal range of motion. She exhibits tenderness (generalized joint line tenderness about the right knee, - swelling, rash, overt deformity). She exhibits no edema or deformity.   Lab Results  Component Value Date   HGBA1C 5.8 (H) 05/25/2017   Procedure: Risk and benefits discussed and verbal consent obtained.  Betadine prep of the right knee times three. Skin overlying the lateral suprapatellar bursa anesthetized using 1:1 mix of 2% lidocaine without and bupivacaine.  Suprapatellar bursa accessed with a 20 gauge needle using a lateral approach.  1:3 mixture of Triamcinolone and bupivacaine injected into the bursae.  There was minimal to no resistance to pushing in the medication. Needle extracted and bandage placed.  Patient with immediate relief of pain with ambulation.    No results found for this or any previous visit (from the past 72 hour(s)).  No results found.  ASSESSMENT AND PLAN:  Melissa Murphy was seen today for  follow-up.  Diagnoses and all orders for this visit:  Medial joint line tenderness of right knee: Injected per procedure note.  Patient tolerated well.  Will see her back next Monday for her BP check,  sooner if her pressure is too high or low with the medication changes.     The patient is advised to call or return to clinic if she does not see an improvement in symptoms, or to seek the care of the closest emergency department if she worsens with the above plan.   Philis Fendt, MHS, PA-C [facility] 05/29/2017 5:25 PM

## 2017-05-28 NOTE — Patient Instructions (Signed)
     IF you received an x-ray today, you will receive an invoice from [facility]. Please contact [facility] at [phone] with questions or concerns regarding your invoice.   IF you received labwork today, you will receive an invoice from [facility]. Please contact [facility] at [phone] with questions or concerns regarding your invoice.   Our billing staff will not be able to assist you with questions regarding bills from these companies.  You will be contacted with the lab results as soon as they are available. The fastest way to get your results is to activate your My Chart account. Instructions are located on the last page of this paperwork. If you have not heard from us regarding the results in 2 weeks, please contact this office.     

## 2017-06-01 ENCOUNTER — Encounter: Payer: Self-pay | Admitting: Physician Assistant

## 2017-06-01 ENCOUNTER — Ambulatory Visit (INDEPENDENT_AMBULATORY_CARE_PROVIDER_SITE_OTHER): Payer: Medicare Other | Admitting: Physician Assistant

## 2017-06-01 VITALS — BP 140/68 | HR 53 | Resp 18 | Ht 64.0 in | Wt 198.0 lb

## 2017-06-01 DIAGNOSIS — I10 Essential (primary) hypertension: Secondary | ICD-10-CM

## 2017-06-01 DIAGNOSIS — E785 Hyperlipidemia, unspecified: Secondary | ICD-10-CM | POA: Diagnosis not present

## 2017-06-01 MED ORDER — ATENOLOL-CHLORTHALIDONE 50-25 MG PO TABS: 0.5000 | ORAL_TABLET | Freq: Every day | ORAL | 3 refills | Status: DC

## 2017-06-01 MED ORDER — AMLODIPINE BESYLATE 2.5 MG PO TABS: 2.5000 mg | ORAL_TABLET | Freq: Every day | ORAL | 3 refills | Status: DC

## 2017-06-01 MED ORDER — SIMVASTATIN 40 MG PO TABS: 20.0000 mg | ORAL_TABLET | Freq: Every day | ORAL | 3 refills | Status: DC

## 2017-06-01 NOTE — Progress Notes (Signed)
06/02/2017 9:39 AM   DOB: 05-25-1936 / MRN: [id number]  SUBJECTIVE:  Melissa Murphy is a 81 y.o. female presenting for uncontrolled HTN.  Tells me her pressure was 220/90 last night but she felt fine at that time.  This was taken at [facility].  She has been taking the medication I have prescribed only, and is no longer taking metoprolol and clonidine per my instruction.  She tells me that she really felt well yesterday.  The swelling in her ankles is also improving.   She is allergic to avastin [bevacizumab]; clindamycin/lincomycin; novocain [procaine]; and sulfa antibiotics.   She  has a past medical history of Anemia; Arthritis; Cataract; Clotting disorder ([place]); Deaf; DVT (deep venous thrombosis) ([place]); Edema; GERD (gastroesophageal reflux disease); Glaucoma; Hernia; Hiatal hernia; Hyperlipidemia; Hypertension; Osteoporosis; Sleep apnea; Thyroid disease; Ulcer; Urinary incontinence; and Uterine polyp.    She  reports that she has never smoked. She has never used smokeless tobacco. She reports that she does not drink alcohol or use drugs. She  reports that she does not currently engage in sexual activity. The patient  has a past surgical history that includes Cataract extraction; goiter removed; xanthoma tumor removed; Refractive surgery; Dilatation & currettage/hysteroscopy with resectoscope (N/A, 10/11/2012); Eye surgery; Hernia repair; Tonsillectomy; and Hiatal hernia repair (N/A, 12/08/2013).  Her family history includes Cancer (age of onset: 29) in her mother; Heart disease in her brother, mother, and paternal grandmother; Heart disease (age of onset: 48) in her father; Hernia in her brother; Hyperlipidemia in her brother; Hypertension in her brother and mother; Migraines in her daughter; Stroke (age of onset: 1) in her mother.  Review of Systems  Constitutional: Negative for chills, diaphoresis and fever.  Respiratory: Negative for cough, hemoptysis, sputum production, shortness of breath and  wheezing.   Cardiovascular: Negative for chest pain, orthopnea and leg swelling.  Gastrointestinal: Negative for nausea.  Skin: Negative for rash.  Neurological: Negative for tingling, tremors, sensory change, speech change, focal weakness, seizures and headaches.    The problem list and medications were reviewed and updated by myself where necessary and exist elsewhere in the encounter.   OBJECTIVE:  BP 140/68 (BP Location: Left Arm, Patient Position: Sitting, Cuff Size: Large)   Pulse (!) 53   Resp 18   Ht 5\' 4"  (1.626 m)   Wt 198 lb (89.8 kg)   SpO2 96%   BMI 33.99 kg/m   Pulse Readings from Last 3 Encounters:  06/01/17 (!) 53  05/25/17 (!) 52  04/28/16 63   Wt Readings from Last 3 Encounters:  06/01/17 198 lb (89.8 kg)  05/28/17 202 lb 3.2 oz (91.7 kg)  05/25/17 203 lb 12.8 oz (92.4 kg)   Temp Readings from Last 3 Encounters:  05/25/17 98.7 F (37.1 C) (Oral)  04/28/16 99 F (37.2 C) (Oral)  04/15/16 98.8 F (37.1 C) (Oral)   BP Readings from Last 3 Encounters:  06/01/17 140/68  05/28/17 (!) 148/100  04/28/16 132/80   Pulse Readings from Last 3 Encounters:  06/01/17 (!) 53  05/25/17 (!) 52  04/28/16 63    Wt Readings from Last 3 Encounters:  06/01/17 198 lb (89.8 kg)  05/28/17 202 lb 3.2 oz (91.7 kg)  05/25/17 203 lb 12.8 oz (92.4 kg)      Physical Exam  Constitutional: She is active.  Non-toxic appearance.  Cardiovascular: Normal rate, regular rhythm, S1 normal, S2 normal, normal heart sounds and intact distal pulses.  Exam reveals no gallop, no friction rub and  no decreased pulses.   No murmur heard. Pulmonary/Chest: Effort normal. No stridor. No tachypnea. No respiratory distress. She has no wheezes. She has no rales.  Abdominal: She exhibits no distension.  Musculoskeletal: She exhibits no edema.  Neurological: She is alert.  Skin: Skin is warm and dry. She is not diaphoretic. No pallor.    Results for orders placed or performed in visit on  06/01/17 (from the past 72 hour(s))  Basic metabolic panel     Status: Abnormal   Collection Time: 06/01/17  5:15 PM  Result Value Ref Range   Glucose 99 65 - 99 mg/dL   BUN 23 8 - 27 mg/dL   Creatinine, Ser 0.83 0.57 - 1.00 mg/dL   GFR calc non Af Amer 68 >59 mL/min/1.73   GFR calc Af Amer 78 >59 mL/min/1.73   BUN/Creatinine Ratio 28 12 - 28   Sodium 133 (L) 134 - 144 mmol/L   Potassium 4.5 3.5 - 5.2 mmol/L   Chloride 93 (L) 96 - 106 mmol/L   CO2 22 20 - 29 mmol/L   Calcium 10.2 8.7 - 10.3 mg/dL    No results found.  ASSESSMENT AND PLAN:  Yanessa was seen today for hypertension.  Diagnoses and all orders for this visit:  Essential hypertension: Reducing tenoretic to 1/2 given complaints of dizziness.  Adding in low dose CCB.  Reducing Zocor to 20 mg given norvasc.  -     atenolol-chlorthalidone (TENORETIC) 50-25 MG tablet; Take 0.5 tablets by mouth daily. -     amLODipine (NORVASC) 2.5 MG tablet; Take 1 tablet (2.5 mg total) by mouth daily. -     Basic metabolic panel  Dyslipidemia -     simvastatin (ZOCOR) 40 MG tablet; Take 0.5 tablets (20 mg total) by mouth at bedtime.    The patient is advised to call or return to clinic if she does not see an improvement in symptoms, or to seek the care of the closest emergency department if she worsens with the above plan.   Philis Fendt, MHS, PA-C [facility] 06/02/2017 9:39 AM

## 2017-06-01 NOTE — Patient Instructions (Addendum)
     IF you received an x-ray today, you will receive an invoice from [facility]. Please contact [facility] at [phone] with questions or concerns regarding your invoice.   IF you received labwork today, you will receive an invoice from [facility]. Please contact [facility] at [phone] with questions or concerns regarding your invoice.   Our billing staff will not be able to assist you with questions regarding bills from these companies.  You will be contacted with the lab results as soon as they are available. The fastest way to get your results is to activate your My Chart account. Instructions are located on the last page of this paperwork. If you have not heard from us regarding the results in 2 weeks, please contact this office.     

## 2017-06-02 LAB — BASIC METABOLIC PANEL
BUN / CREAT RATIO: 28 (ref 12–28)
BUN: 23 mg/dL (ref 8–27)
CHLORIDE: 93 mmol/L — AB (ref 96–106)
CO2: 22 mmol/L (ref 20–29)
CREATININE: 0.83 mg/dL (ref 0.57–1.00)
Calcium: 10.2 mg/dL (ref 8.7–10.3)
GFR, EST AFRICAN AMERICAN: 78 mL/min/{1.73_m2} (ref >59)
GFR, EST NON AFRICAN AMERICAN: 68 mL/min/{1.73_m2} (ref >59)
Glucose: 99 mg/dL (ref 65–99)
Potassium: 4.5 mmol/L (ref 3.5–5.2)
Sodium: 133 mmol/L — ABNORMAL LOW (ref 134–144)

## 2017-06-04 ENCOUNTER — Ambulatory Visit (INDEPENDENT_AMBULATORY_CARE_PROVIDER_SITE_OTHER): Payer: Medicare Other | Admitting: Physician Assistant

## 2017-06-04 ENCOUNTER — Encounter: Payer: Self-pay | Admitting: Physician Assistant

## 2017-06-04 VITALS — BP 121/77 | HR 57 | Temp 98.1°F | Resp 18 | Ht 64.0 in | Wt 196.2 lb

## 2017-06-04 DIAGNOSIS — I10 Essential (primary) hypertension: Secondary | ICD-10-CM | POA: Diagnosis not present

## 2017-06-04 NOTE — Patient Instructions (Addendum)
   Current Outpatient Prescriptions:  .  amLODipine (NORVASC) 2.5 MG tablet, Take 1 tablet (2.5 mg total) by mouth daily., Disp: 90 tablet, Rfl: 3 .  aspirin 81 MG tablet, Take 81 mg by mouth every morning. , Disp: , Rfl:  .  atenolol-chlorthalidone (TENORETIC) 50-25 MG tablet, Take 0.5 tablets by mouth daily., Disp: 45 tablet, Rfl: 3 .  dorzolamide-timolol (COSOPT) 22.3-6.8 MG/ML ophthalmic solution, Place 1 drop into the right eye 2 (two) times daily., Disp: , Rfl:  .  ramipril (ALTACE) 10 MG capsule, TAKE ONE CAPSULE BY MOUTH EVERY DAY, Disp: 90 capsule, Rfl: 0 .  ranitidine (ZANTAC 75) 75 MG tablet, Take 1 tablet (75 mg total) by mouth 2 (two) times daily., Disp: 90 tablet, Rfl: 0 .  simvastatin (ZOCOR) 40 MG tablet, Take 0.5 tablets (20 mg total) by mouth at bedtime., Disp: 90 tablet, Rfl: 3 .  zolpidem (AMBIEN) 10 MG tablet, TAKE 1 TABLET AT BEDTIME, Disp: 30 tablet, Rfl: 0    IF you received an x-ray today, you will receive an invoice from [facility]. Please contact [facility] at [phone] with questions or concerns regarding your invoice.   IF you received labwork today, you will receive an invoice from [facility]. Please contact [facility] at [phone] with questions or concerns regarding your invoice.   Our billing staff will not be able to assist you with questions regarding bills from these companies.  You will be contacted with the lab results as soon as they are available. The fastest way to get your results is to activate your My Chart account. Instructions are located on the last page of this paperwork. If you have not heard from [place] regarding the results in 2 weeks, please contact this office.

## 2017-06-04 NOTE — Progress Notes (Signed)
06/04/2017 2:42 PM   DOB: [birth year] / MRN: [id number]  SUBJECTIVE:  Melissa Murphy is a 81 y.o. female presenting for HTN med check.  Despite our last meeting she continued taking a full tab of chlorthalidone despite me changing the script.  She added the amlodipine 2.5 mg to this regimen.. She tells me that she feels terrible and weak.  Her swelling has improved.   She is allergic to avastin [bevacizumab]; clindamycin/lincomycin; novocain [procaine]; and sulfa antibiotics.   She  has a past medical history of Anemia; Arthritis; Cataract; Clotting disorder ([place]); Deaf; DVT (deep venous thrombosis) ([place]); Edema; GERD (gastroesophageal reflux disease); Glaucoma; Hernia; Hiatal hernia; Hyperlipidemia; Hypertension; Osteoporosis; Sleep apnea; Thyroid disease; Ulcer; Urinary incontinence; and Uterine polyp.    She  reports that she has never smoked. She has never used smokeless tobacco. She reports that she does not drink alcohol or use drugs. She  reports that she does not currently engage in sexual activity. The patient  has a past surgical history that includes Cataract extraction; goiter removed; xanthoma tumor removed; Refractive surgery; Dilatation & currettage/hysteroscopy with resectoscope (N/A, 10/11/2012); Eye surgery; Hernia repair; Tonsillectomy; and Hiatal hernia repair (N/A, 12/08/2013).  Her family history includes Cancer (age of onset: 69) in her mother; Heart disease in her brother, mother, and paternal grandmother; Heart disease (age of onset: 61) in her father; Hernia in her brother; Hyperlipidemia in her brother; Hypertension in her brother and mother; Migraines in her daughter; Stroke (age of onset: 36) in her mother.  Review of Systems  Constitutional: Negative for chills, diaphoresis and fever.  Eyes: Negative.   Respiratory: Negative for shortness of breath.   Cardiovascular: Negative for chest pain, orthopnea and leg swelling.  Gastrointestinal: Negative for abdominal  pain, blood in stool, constipation, diarrhea, heartburn, melena, nausea and vomiting.  Genitourinary: Negative for flank pain.  Skin: Negative for rash.  Neurological: Negative for dizziness, sensory change, speech change, focal weakness and headaches.    The problem list and medications were reviewed and updated by myself where necessary and exist elsewhere in the encounter.   OBJECTIVE:  BP 121/77 (BP Location: Left Arm, Patient Position: Sitting, Cuff Size: Large)   Pulse (!) 57   Temp 98.1 F (36.7 C) (Oral)   Resp 18   Ht 5\' 4"  (1.626 m)   Wt 196 lb 3.2 oz (89 kg)   SpO2 95%   BMI 33.68 kg/m   Wt Readings from Last 3 Encounters:  06/04/17 196 lb 3.2 oz (89 kg)  06/01/17 198 lb (89.8 kg)  05/28/17 202 lb 3.2 oz (91.7 kg)     Physical Exam  Constitutional: She is active.  Non-toxic appearance.  Cardiovascular: Normal rate, regular rhythm, S1 normal, S2 normal, normal heart sounds and intact distal pulses.  Exam reveals no gallop, no friction rub and no decreased pulses.   No murmur heard. Pulmonary/Chest: Effort normal. No stridor. No tachypnea. No respiratory distress. She has no wheezes. She has no rales.  Abdominal: She exhibits no distension.  Musculoskeletal: She exhibits no edema.  Neurological: She is alert.  Skin: Skin is warm and dry. She is not diaphoretic. No pallor.    Results for orders placed or performed in visit on 06/01/17 (from the past 72 hour(s))  Basic metabolic panel     Status: Abnormal   Collection Time: 06/01/17  5:15 PM  Result Value Ref Range   Glucose 99 65 - 99 mg/dL   BUN 23 8 - 27 mg/dL  Creatinine, Ser 0.83 0.57 - 1.00 mg/dL   GFR calc non Af Amer 68 >59 mL/min/1.73   GFR calc Af Amer 78 >59 mL/min/1.73   BUN/Creatinine Ratio 28 12 - 28   Sodium 133 (L) 134 - 144 mmol/L   Potassium 4.5 3.5 - 5.2 mmol/L   Chloride 93 (L) 96 - 106 mmol/L   CO2 22 20 - 29 mmol/L   Calcium 10.2 8.7 - 10.3 mg/dL   Lab Results  Component Value Date    WBC 6.3 05/25/2017   HGB 13.4 05/25/2017   HCT 40.0 05/25/2017   MCV 93 05/25/2017   PLT 221 05/25/2017    Lab Results  Component Value Date   TSH 1.77 04/28/2016     No results found.  ASSESSMENT AND PLAN:  Laine was seen today for follow-up.  Diagnoses and all orders for this visit:  Hypertension, well controlled: Will see her back next Tuesday for hopefully the final recheck.     The patient is advised to call or return to clinic if she does not see an improvement in symptoms, or to seek the care of the closest emergency department if she worsens with the above plan.   Philis Fendt, MHS, PA-C [facility] 06/04/2017 2:42 PM

## 2017-06-12 ENCOUNTER — Encounter: Payer: Self-pay | Admitting: Physician Assistant

## 2017-06-12 ENCOUNTER — Ambulatory Visit (INDEPENDENT_AMBULATORY_CARE_PROVIDER_SITE_OTHER): Payer: Medicare Other | Admitting: Physician Assistant

## 2017-06-12 VITALS — BP 104/58 | HR 49 | Temp 97.9°F | Resp 18 | Ht 64.0 in | Wt 192.2 lb

## 2017-06-12 DIAGNOSIS — I10 Essential (primary) hypertension: Secondary | ICD-10-CM

## 2017-06-12 DIAGNOSIS — R300 Dysuria: Secondary | ICD-10-CM

## 2017-06-12 LAB — POCT CBC
GRANULOCYTE PERCENT: 62.3 % (ref 37–80)
HEMATOCRIT: 43.1 % (ref 37.7–47.9)
Hemoglobin: 14.2 g/dL (ref 12.2–16.2)
LYMPH, POC: 2.7 (ref 0.6–3.4)
MCH, POC: 31 pg (ref 27–31.2)
MCHC: 33.1 g/dL (ref 31.8–35.4)
MCV: 93.7 fL (ref 80–97)
MID (CBC): 0.8 (ref 0–0.9)
MPV: 7.6 fL (ref 0–99.8)
POC GRANULOCYTE: 5.7 (ref 2–6.9)
POC LYMPH %: 29.4 % (ref 10–50)
POC MID %: 8.3 %M (ref 0–12)
Platelet Count, POC: 245 10*3/uL (ref 142–424)
RBC: 4.6 M/uL (ref 4.04–5.48)
RDW, POC: 12.4 %
WBC: 9.2 10*3/uL (ref 4.6–10.2)

## 2017-06-12 LAB — POCT URINALYSIS DIP (MANUAL ENTRY)
Bilirubin, UA: NEGATIVE
Glucose, UA: NEGATIVE mg/dL
NITRITE UA: NEGATIVE
PH UA: 5.5 (ref 5.0–8.0)
PROTEIN UA: NEGATIVE mg/dL
Spec Grav, UA: 1.025 (ref 1.010–1.025)
UROBILINOGEN UA: 1 U/dL

## 2017-06-12 MED ORDER — CEPHALEXIN 500 MG PO CAPS: 500.0000 mg | ORAL_CAPSULE | Freq: Three times a day (TID) | ORAL | 0 refills | Status: DC

## 2017-06-12 NOTE — Patient Instructions (Signed)
1.ke the keflex for you urinary tract infection.   2. Take your atenolol/chlorthalidone, 1/2 tab, every other day.   3.  Stop your Norvasc.   4.  Come back to see my colleague PA Bess Harvest on Tuesday so he recheck you urine and see how your blood pressure is doing.

## 2017-06-12 NOTE — Progress Notes (Signed)
06/13/2017 11:28 AM   DOB: [birth year] / MRN: [id number]  SUBJECTIVE:  Melissa Murphy is a 81 y.o. female presenting for HTN recheck.  Her pressure is controlled and her leg swelling is down.  She complains of being very weak and fatigued, despite cutting her Tenoretic in half. Associates urinary urgency and frequency. Denies dysuria and hematuria.   She is allergic to avastin [bevacizumab]; clindamycin/lincomycin; novocain [procaine]; and sulfa antibiotics.   She  has a past medical history of Anemia; Arthritis; Cataract; Clotting disorder ([place]); Deaf; DVT (deep venous thrombosis) ([place]); Edema; GERD (gastroesophageal reflux disease); Glaucoma; Hernia; Hiatal hernia; Hyperlipidemia; Hypertension; Osteoporosis; Sleep apnea; Thyroid disease; Ulcer; Urinary incontinence; and Uterine polyp.    She  reports that she has never smoked. She has never used smokeless tobacco. She reports that she does not drink alcohol or use drugs. She  reports that she does not currently engage in sexual activity. The patient  has a past surgical history that includes Cataract extraction; goiter removed; xanthoma tumor removed; Refractive surgery; Dilatation & currettage/hysteroscopy with resectoscope (N/A, 10/11/2012); Eye surgery; Hernia repair; Tonsillectomy; and Hiatal hernia repair (N/A, 12/08/2013).  Her family history includes Cancer (age of onset: 20) in her mother; Heart disease in her brother, mother, and paternal grandmother; Heart disease (age of onset: 49) in her father; Hernia in her brother; Hyperlipidemia in her brother; Hypertension in her brother and mother; Migraines in her daughter; Stroke (age of onset: 4) in her mother.  Review of Systems  Constitutional: Negative for chills, diaphoresis and fever.  Eyes: Negative.   Respiratory: Negative for shortness of breath.   Cardiovascular: Negative for chest pain, orthopnea and leg swelling.  Gastrointestinal: Negative for nausea.  Skin: Negative for  rash.  Neurological: Negative for dizziness, sensory change, speech change, focal weakness and headaches.    The problem list and medications were reviewed and updated by myself where necessary and exist elsewhere in the encounter.   OBJECTIVE:  BP (!) 104/58 (BP Location: Left Arm, Patient Position: Sitting, Cuff Size: Normal)   Pulse (!) 49   Temp 97.9 F (36.6 C) (Oral)   Resp 18   Ht 5\' 4"  (1.626 m)   Wt 192 lb 3.2 oz (87.2 kg)   SpO2 97%   BMI 32.99 kg/m   Wt Readings from Last 3 Encounters:  06/12/17 192 lb 3.2 oz (87.2 kg)  06/04/17 196 lb 3.2 oz (89 kg)  06/01/17 198 lb (89.8 kg)   Pulse Readings from Last 3 Encounters:  06/12/17 (!) 49  06/04/17 (!) 57  06/01/17 (!) 53   BP Readings from Last 3 Encounters:  06/12/17 (!) 104/58  06/04/17 121/77  06/01/17 140/68    Physical Exam  Constitutional: She is active.  Non-toxic appearance.  Cardiovascular: Normal rate, regular rhythm, S1 normal, S2 normal, normal heart sounds and intact distal pulses.  Exam reveals no gallop, no friction rub and no decreased pulses.   No murmur heard. Pulmonary/Chest: Effort normal. No stridor. No tachypnea. No respiratory distress. She has no wheezes. She has no rales.  Abdominal: She exhibits no distension.  Musculoskeletal: Normal range of motion. She exhibits no edema.  Neurological: She is alert.  Skin: Skin is warm and dry. She is not diaphoretic. No pallor.   BMP Latest Ref Rng & Units 06/12/2017 06/01/2017 05/25/2017  Glucose 65 - 99 mg/dL 127(H) 99 84  BUN 8 - 27 mg/dL 22 23 18   Creatinine 0.57 - 1.00 mg/dL 1.06(H) 0.83 0.80  BUN/Creat Ratio  12 - 28 21 28  -  Sodium 134 - 144 mmol/L 129(L) 133(L) 142  Potassium 3.5 - 5.2 mmol/L 3.7 4.5 4.4  Chloride 96 - 106 mmol/L 86(L) 93(L) 100  CO2 20 - 29 mmol/L 27 22 23   Calcium 8.7 - 10.3 mg/dL 9.8 10.2 9.7   Results for orders placed or performed in visit on 06/12/17 (from the past 72 hour(s))  POCT CBC     Status: None    Collection Time: 06/12/17  3:07 PM  Result Value Ref Range   WBC 9.2 4.6 - 10.2 K/uL   Lymph, poc 2.7 0.6 - 3.4   POC LYMPH PERCENT 29.4 10 - 50 %L   MID (cbc) 0.8 0 - 0.9   POC MID % 8.3 0 - 12 %M   POC Granulocyte 5.7 2 - 6.9   Granulocyte percent 62.3 37 - 80 %G   RBC 4.60 4.04 - 5.48 M/uL   Hemoglobin 14.2 12.2 - 16.2 g/dL   HCT, POC 43.1 37.7 - 47.9 %   MCV 93.7 80 - 97 fL   MCH, POC 31.0 27 - 31.2 pg   MCHC 33.1 31.8 - 35.4 g/dL   RDW, POC 12.4 %   Platelet Count, POC 245 142 - 424 K/uL   MPV 7.6 0 - 99.8 fL  Basic metabolic panel     Status: Abnormal   Collection Time: 06/12/17  3:40 PM  Result Value Ref Range   Glucose 127 (H) 65 - 99 mg/dL   BUN 22 8 - 27 mg/dL   Creatinine, Ser 1.06 (H) 0.57 - 1.00 mg/dL   GFR calc non Af Amer 50 (L) >59 mL/min/1.73   GFR calc Af Amer 58 (L) >59 mL/min/1.73   BUN/Creatinine Ratio 21 12 - 28   Sodium 129 (L) 134 - 144 mmol/L   Potassium 3.7 3.5 - 5.2 mmol/L   Chloride 86 (L) 96 - 106 mmol/L   CO2 27 20 - 29 mmol/L   Calcium 9.8 8.7 - 10.3 mg/dL  POCT urinalysis dipstick     Status: Abnormal   Collection Time: 06/12/17  3:53 PM  Result Value Ref Range   Color, UA yellow yellow   Clarity, UA cloudy (A) clear   Glucose, UA negative negative mg/dL   Bilirubin, UA negative negative   Ketones, POC UA trace (5) (A) negative mg/dL   Spec Grav, UA 1.025 1.010 - 1.025   Blood, UA moderate (A) negative   pH, UA 5.5 5.0 - 8.0   Protein Ur, POC negative negative mg/dL   Urobilinogen, UA 1.0 0.2 or 1.0 E.U./dL   Nitrite, UA Negative Negative   Leukocytes, UA Large (3+) (A) Negative    No results found.  ASSESSMENT AND PLAN:  Melissa Murphy was seen today for hypertension.  Diagnoses and all orders for this visit:  Well-controlled hypertension: She has been diuresed roughly 10 lbs over the last 20 days or so.  She continues to complain of fatigue.  Labs show a mild dehydration.  Will move the atenolol-chlorthalidone to every other day for  now. Holding norvasc.  Clearly has a UTI on dip and is complaing of frequency and urgency,  but no dysuria. .  Will treat that etiology as it is most likely she is weak and fatigued from both a UTI AND over medication. She will see my collegue PA Bess Harvest in about 4 days to check progress and hopefully a final BP titration.  -     POCT urinalysis dipstick -  POCT CBC -     Basic metabolic panel -     EKG     The patient is advised to call or return to clinic if she does not see an improvement in symptoms, or to seek the care of the closest emergency department if she worsens with the above plan.   Philis Fendt, MHS, PA-C [facility] 06/13/2017 11:28 AM

## 2017-06-13 LAB — BASIC METABOLIC PANEL
BUN / CREAT RATIO: 21 (ref 12–28)
BUN: 22 mg/dL (ref 8–27)
CHLORIDE: 86 mmol/L — AB (ref 96–106)
CO2: 27 mmol/L (ref 20–29)
Calcium: 9.8 mg/dL (ref 8.7–10.3)
Creatinine, Ser: 1.06 mg/dL — ABNORMAL HIGH (ref 0.57–1.00)
GFR calc non Af Amer: 50 mL/min/{1.73_m2} — ABNORMAL LOW (ref >59)
GFR, EST AFRICAN AMERICAN: 58 mL/min/{1.73_m2} — AB (ref >59)
Glucose: 127 mg/dL — ABNORMAL HIGH (ref 65–99)
Potassium: 3.7 mmol/L (ref 3.5–5.2)
Sodium: 129 mmol/L — ABNORMAL LOW (ref 134–144)

## 2017-06-14 LAB — URINE CULTURE

## 2017-06-15 ENCOUNTER — Telehealth: Payer: Self-pay

## 2017-06-15 NOTE — Telephone Encounter (Signed)
Called pt's dtr (DPR on file) to see if pt would be able to come in early to do AWV with nurse health advisor before next office visit with pcp.    Josepha Pigg, B.A.  Care Guide - [facility]

## 2017-06-19 ENCOUNTER — Ambulatory Visit: Payer: Medicare Other | Admitting: Urgent Care

## 2017-06-21 ENCOUNTER — Other Ambulatory Visit: Payer: Self-pay

## 2017-06-21 ENCOUNTER — Encounter: Payer: Self-pay | Admitting: Urgent Care

## 2017-06-21 ENCOUNTER — Ambulatory Visit (INDEPENDENT_AMBULATORY_CARE_PROVIDER_SITE_OTHER): Payer: Medicare Other | Admitting: Urgent Care

## 2017-06-21 VITALS — BP 114/68 | HR 99 | Temp 97.9°F | Resp 16 | Ht 64.0 in | Wt 195.0 lb

## 2017-06-21 DIAGNOSIS — R7989 Other specified abnormal findings of blood chemistry: Secondary | ICD-10-CM

## 2017-06-21 DIAGNOSIS — I10 Essential (primary) hypertension: Secondary | ICD-10-CM

## 2017-06-21 DIAGNOSIS — I8310 Varicose veins of unspecified lower extremity with inflammation: Secondary | ICD-10-CM

## 2017-06-21 DIAGNOSIS — R609 Edema, unspecified: Secondary | ICD-10-CM | POA: Diagnosis not present

## 2017-06-21 DIAGNOSIS — N3090 Cystitis, unspecified without hematuria: Secondary | ICD-10-CM | POA: Diagnosis not present

## 2017-06-21 LAB — POCT URINALYSIS DIP (MANUAL ENTRY)
Bilirubin, UA: NEGATIVE
Glucose, UA: NEGATIVE mg/dL
Ketones, POC UA: NEGATIVE mg/dL
LEUKOCYTES UA: NEGATIVE
NITRITE UA: NEGATIVE
PH UA: 5 (ref 5.0–8.0)
PROTEIN UA: NEGATIVE mg/dL
Spec Grav, UA: 1.015 (ref 1.010–1.025)
UROBILINOGEN UA: 0.2 U/dL

## 2017-06-21 NOTE — Progress Notes (Signed)
MRN: [id number] DOB: 02/05/1936  Subjective:   Melissa Murphy is a 81 y.o. female presenting for follow up on cystitis. Patient reports improvement since her last OV, 06/12/2017. She is almost done with cephalexin which urine culture results support as viable treatment. Today, denies headaches, dizziness, chest pain, shob, dyspnea, abdominal pain, hematuria, dysuria, pelvic or flank pain. She still has some urinary frequency. Admits that she does not hydrate well. Drinks Sprite, sweet tea. Regarding her BP, she is taking atenolol-chlorthalidone every other day. She reports lower leg swelling which is improved from her last office visit. Has been seen by vascular surgeon before and was recommended she wear compression stockings but does not do this.  Melissa Murphy has a current medication list which includes the following prescription(s): amlodipine, aspirin, atenolol-chlorthalidone, cephalexin, dorzolamide-timolol, ramipril, ranitidine, simvastatin, and zolpidem. Also is allergic to avastin [bevacizumab]; clindamycin/lincomycin; novocain [procaine]; and sulfa antibiotics.  Melissa Murphy  has a past medical history of Anemia, Arthritis, Cataract, Clotting disorder ([place]), Deaf, DVT (deep venous thrombosis) ([place]), Edema, GERD (gastroesophageal reflux disease), Glaucoma, Hernia, Hiatal hernia, Hyperlipidemia, Hypertension, Osteoporosis, Sleep apnea, Thyroid disease, Ulcer, Urinary incontinence, and Uterine polyp. Also  has a past surgical history that includes Cataract extraction; goiter removed; xanthoma tumor removed; Refractive surgery; Eye surgery; Hernia repair; Tonsillectomy; LAPAROSCOPIC REPAIR OF HIATAL HERNIA (N/A, 12/08/2013); and DILATATION & CURETTAGE/HYSTEROSCOPY WITH RESECTOCOPE (N/A, 10/11/2012).  Objective:   Vitals: BP 114/68   Pulse 99   Temp 97.9 F (36.6 C) (Oral)   Resp 16   Ht 5\' 4"  (1.626 m)   Wt 195 lb (88.5 kg)   SpO2 98%   BMI 33.47 kg/m   BP Readings from Last 3 Encounters:    06/21/17 114/68  06/12/17 (!) 104/58  06/04/17 121/77    Wt Readings from Last 3 Encounters:  06/21/17 195 lb (88.5 kg)  06/12/17 192 lb 3.2 oz (87.2 kg)  06/04/17 196 lb 3.2 oz (89 kg)    Physical Exam  Constitutional: She is oriented to person, place, and time. She appears well-developed and well-nourished.  HENT:  Mouth/Throat: Oropharynx is clear and moist.  Eyes: No scleral icterus.  Cardiovascular: Normal rate, regular rhythm and intact distal pulses. Exam reveals no gallop and no friction rub.  No murmur heard. Pulmonary/Chest: No respiratory distress. She has no wheezes. She has no rales.  Abdominal: Soft. Bowel sounds are normal. She exhibits no distension and no mass. There is no tenderness. There is no guarding.  Musculoskeletal: She exhibits edema.  Neurological: She is alert and oriented to person, place, and time.  Skin: Skin is warm and dry.     Psychiatric: She has a normal mood and affect.   Results for orders placed or performed in visit on 06/21/17 (from the past 24 hour(s))  POCT urinalysis dipstick     Status: Abnormal   Collection Time: 06/21/17  3:39 PM  Result Value Ref Range   Color, UA yellow yellow   Clarity, UA clear clear   Glucose, UA negative negative mg/dL   Bilirubin, UA negative negative   Ketones, POC UA negative negative mg/dL   Spec Grav, UA 1.015 1.010 - 1.025   Blood, UA trace-lysed (A) negative   pH, UA 5.0 5.0 - 8.0   Protein Ur, POC negative negative mg/dL   Urobilinogen, UA 0.2 0.2 or 1.0 E.U./dL   Nitrite, UA Negative Negative   Leukocytes, UA Negative Negative    Assessment and Plan :   1. Cystitis - Urinalysis is reassuring. Recommended patient  stop sugary and caffeinated beverages. She is to hydrate much better. Follow up as needed.  2. Essential hypertension - Stable, continue to monitor.  3. Elevated serum creatinine - Basic metabolic panel pending  4. Peripheral edema 5. Lipodermatosclerosis - Recommended  patient wear compression stockings. She is not very interested in going back to vascular surgery.  Jaynee Eagles, PA-C [facility] [phone] 06/21/2017 2:50 PM

## 2017-06-21 NOTE — Patient Instructions (Signed)
     IF you received an x-ray today, you will receive an invoice from [facility]. Please contact [facility] at [phone] with questions or concerns regarding your invoice.   IF you received labwork today, you will receive an invoice from [facility]. Please contact [facility] at [phone] with questions or concerns regarding your invoice.   Our billing staff will not be able to assist you with questions regarding bills from these companies.  You will be contacted with the lab results as soon as they are available. The fastest way to get your results is to activate your My Chart account. Instructions are located on the last page of this paperwork. If you have not heard from us regarding the results in 2 weeks, please contact this office.     

## 2017-06-22 LAB — BASIC METABOLIC PANEL
BUN/Creatinine Ratio: 30 — ABNORMAL HIGH (ref 12–28)
BUN: 21 mg/dL (ref 8–27)
CHLORIDE: 90 mmol/L — AB (ref 96–106)
CO2: 28 mmol/L (ref 20–29)
Calcium: 9.6 mg/dL (ref 8.7–10.3)
Creatinine, Ser: 0.69 mg/dL (ref 0.57–1.00)
GFR calc Af Amer: 96 mL/min/{1.73_m2} (ref >59)
GFR calc non Af Amer: 83 mL/min/{1.73_m2} (ref >59)
GLUCOSE: 74 mg/dL (ref 65–99)
POTASSIUM: 3.6 mmol/L (ref 3.5–5.2)
SODIUM: 136 mmol/L (ref 134–144)

## 2017-06-22 NOTE — Addendum Note (Signed)
Addended by: Jaynee Eagles on: 06/22/2017 11:01 AM   Modules accepted: Orders

## 2017-06-25 ENCOUNTER — Telehealth: Payer: Self-pay | Admitting: Physician Assistant

## 2017-06-25 NOTE — Telephone Encounter (Signed)
Copied from [place]. Topic: Quick Communication - See Telephone Encounter >> Jun 25, 2017  4:03 PM Boyd Kerbs wrote: CRM for notification. See Telephone encounter for:  the prescription Zoldeimtartare 10mg . Has to be pre-approved with insurance co. [employer] employee ins. Before can be filled  Patient does not have light on phone to signal her, she said can leave a message 06/25/17.

## 2017-06-25 NOTE — Telephone Encounter (Signed)
Rx r

## 2017-06-27 ENCOUNTER — Emergency Department (HOSPITAL_COMMUNITY)
Admission: EM | Admit: 2017-06-27 | Discharge: 2017-06-27 | Disposition: A | Discharge status: Home / Self Care | Payer: Medicare Other | Attending: Physician Assistant | Admitting: Physician Assistant

## 2017-06-27 ENCOUNTER — Emergency Department (HOSPITAL_COMMUNITY): Payer: Medicare Other

## 2017-06-27 ENCOUNTER — Encounter (HOSPITAL_COMMUNITY): Payer: Self-pay | Admitting: *Deleted

## 2017-06-27 DIAGNOSIS — K625 Hemorrhage of anus and rectum: Secondary | ICD-10-CM | POA: Diagnosis present

## 2017-06-27 DIAGNOSIS — Z79899 Other long term (current) drug therapy: Secondary | ICD-10-CM | POA: Insufficient documentation

## 2017-06-27 DIAGNOSIS — R1031 Right lower quadrant pain: Secondary | ICD-10-CM | POA: Diagnosis not present

## 2017-06-27 DIAGNOSIS — K649 Unspecified hemorrhoids: Secondary | ICD-10-CM | POA: Insufficient documentation

## 2017-06-27 DIAGNOSIS — Z7982 Long term (current) use of aspirin: Secondary | ICD-10-CM | POA: Diagnosis not present

## 2017-06-27 DIAGNOSIS — I10 Essential (primary) hypertension: Secondary | ICD-10-CM | POA: Insufficient documentation

## 2017-06-27 DIAGNOSIS — K5730 Diverticulosis of large intestine without perforation or abscess without bleeding: Secondary | ICD-10-CM | POA: Diagnosis not present

## 2017-06-27 LAB — URINALYSIS, ROUTINE W REFLEX MICROSCOPIC
BILIRUBIN URINE: NEGATIVE
Glucose, UA: NEGATIVE mg/dL
Hgb urine dipstick: NEGATIVE
Ketones, ur: NEGATIVE mg/dL
LEUKOCYTES UA: NEGATIVE
Nitrite: NEGATIVE
PH: 5 (ref 5.0–8.0)
Protein, ur: NEGATIVE mg/dL
SPECIFIC GRAVITY, URINE: 1.01 (ref 1.005–1.030)

## 2017-06-27 LAB — COMPREHENSIVE METABOLIC PANEL
ALBUMIN: 3.6 g/dL (ref 3.5–5.0)
ALK PHOS: 66 U/L (ref 38–126)
ALT: 21 U/L (ref 14–54)
ANION GAP: 9 (ref 5–15)
AST: 28 U/L (ref 15–41)
BILIRUBIN TOTAL: 0.4 mg/dL (ref 0.3–1.2)
BUN: 17 mg/dL (ref 6–20)
CALCIUM: 9.4 mg/dL (ref 8.9–10.3)
CO2: 30 mmol/L (ref 22–32)
CREATININE: 0.66 mg/dL (ref 0.44–1.00)
Chloride: 93 mmol/L — ABNORMAL LOW (ref 101–111)
GFR calc non Af Amer: >60 mL/min (ref >60)
GLUCOSE: 84 mg/dL (ref 65–99)
Potassium: 3.4 mmol/L — ABNORMAL LOW (ref 3.5–5.1)
Sodium: 132 mmol/L — ABNORMAL LOW (ref 135–145)
TOTAL PROTEIN: 7.4 g/dL (ref 6.5–8.1)

## 2017-06-27 LAB — CBC
HCT: 38.1 % (ref 36.0–46.0)
Hemoglobin: 13.2 g/dL (ref 12.0–15.0)
MCH: 31.8 pg (ref 26.0–34.0)
MCHC: 34.6 g/dL (ref 30.0–36.0)
MCV: 91.8 fL (ref 78.0–100.0)
PLATELETS: 218 10*3/uL (ref 150–400)
RBC: 4.15 MIL/uL (ref 3.87–5.11)
RDW: 13.2 % (ref 11.5–15.5)
WBC: 8.7 10*3/uL (ref 4.0–10.5)

## 2017-06-27 LAB — TYPE AND SCREEN
ABO/RH(D): O POS
Antibody Screen: NEGATIVE

## 2017-06-27 LAB — ABO/RH: ABO/RH(D): O POS

## 2017-06-27 MED ORDER — IOPAMIDOL (ISOVUE-300) INJECTION 61%
100.0000 mL | Freq: Once | INTRAVENOUS | Status: AC | PRN
  Administered 2017-06-27: 100 mL via INTRAVENOUS

## 2017-06-27 MED ORDER — RANITIDINE HCL 150 MG/10ML PO SYRP
150.0000 mg | ORAL_SOLUTION | Freq: Once | ORAL | Status: AC
  Administered 2017-06-27: 150 mg via ORAL
  Filled 2017-06-27: qty 10

## 2017-06-27 MED ORDER — IOPAMIDOL (ISOVUE-300) INJECTION 61%
INTRAVENOUS | Status: AC
  Administered 2017-06-27: 100 mL via INTRAVENOUS
  Filled 2017-06-27: qty 100

## 2017-06-27 NOTE — Telephone Encounter (Signed)
Forwarding to [employer] for Prior Approval of med Zoldeimtartare 10mg 

## 2017-06-27 NOTE — ED Provider Notes (Signed)
[facility] DEPT Provider Note   CSN: [id number] Arrival date & time: 06/27/17  0131     History   Chief Complaint Chief Complaint  Patient presents with  . Rectal Bleeding    HPI Melissa Murphy is a 81 y.o. female.  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  Patient has her daughter here who plan to communicate.  Even with this patient is very difficult to communicate with.  Patient seems to be concerned about several different things however is also demanding to leave.  I have said repeatedly that I am happy to help her in any way that she would like.  She is concerned because she had some right lower quadrant pain that is now gone away.  She is also concerned because she had a urinary tract infection for which she was on antibiotics.  She also is concerned about all of her medications.  I spent greater than 45 minutes in the room with patient patient's daughter and still it is unclear to me what the chief complaint is.  Patient upset because of the way that her gown fits, did not want to sit in the bed, I have offered to get a CAT scan given that she had some right lower quadrant pain and to run baseline labs and urine.  Patient denies any fever nausea vomiting or constipation.  Does endorse loose stools.  Endorses some amount of blood at some point with some stools.  She is unable to clarify further. Past Medical History:  Diagnosis Date  . Anemia   . Arthritis   . Cataract   . Clotting disorder ([place])   . Deaf    Congenital; Requires an interpreter  . DVT (deep venous thrombosis) ([facility])    in eye, and leg  . Edema   . GERD (gastroesophageal reflux disease)   . Glaucoma   . Hernia   . Hiatal hernia   . Hyperlipidemia   . Hypertension   . Osteoporosis   . Sleep apnea   . Thyroid disease   . Ulcer   . Urinary incontinence   . Uterine polyp     Patient Active Problem List   Diagnosis Date Noted  . Deafness  04/03/2014  . Urge incontinence of urine 04/01/2014  . Primary osteoarthritis of left knee 04/01/2014  . Insomnia 04/01/2014  . Hiatal hernia 12/08/2013  . Preop cardiovascular exam 09/25/2013  . Obstructive sleep apnea 01/10/2013  . Uterine polyp   . Unspecified chronic bronchitis ([place]) 10/02/2012  . 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 Early satiety 09/10/2012  . Bradycardia 08/19/2012  . Hypertension 04/30/2012  . Edema 09/18/2011  . Leg pain, bilateral 09/18/2011  . Stool color abnormal 09/18/2011  . Anemia 09/18/2011    Past Surgical History:  Procedure Laterality Date  . CATARACT EXTRACTION     both eyes  . EYE SURGERY    . goiter removed     age 22  . HERNIA REPAIR    . REFRACTIVE SURGERY    . TONSILLECTOMY    . xanthoma tumor removed      OB History    No data available       Home Medications    Prior to Admission medications   Medication Sig Start Date End Date Taking? Authorizing Provider  aspirin 81 MG tablet Take 81 mg by mouth every morning.    Yes [provider]  atenolol-chlorthalidone (TENORETIC) 50-25 MG tablet Take 0.5  tablets by mouth daily. 06/01/17  Yes Tereasa Coop, PA-C  bismuth subsalicylate (PEPTO BISMOL) 262 MG/15ML suspension Take 30 mLs every 6 (six) hours as needed by mouth for indigestion.   Yes [provider]  dorzolamide-timolol (COSOPT) 22.3-6.8 MG/ML ophthalmic solution Place 1 drop into the right eye 2 (two) times daily.   Yes [provider]  ramipril (ALTACE) 10 MG capsule TAKE ONE CAPSULE BY MOUTH EVERY DAY 10/12/16  Yes Jeffery, Chelle, PA-C  ranitidine (ZANTAC) 150 MG tablet Take 150 mg 2 (two) times daily by mouth.   Yes [provider]  simvastatin (ZOCOR) 40 MG tablet Take 0.5 tablets (20 mg total) by mouth at bedtime. 06/01/17  Yes Tereasa Coop, PA-C    Family History Family History  Problem Relation Age of Onset  . Cancer Mother 5       colon cancer  . Stroke Mother  59       cause of death  . Heart disease Mother   . Hypertension Mother   . Heart disease Father 61       AMI; cause of death  . Heart disease Brother        AMI x 2; tobacco abuse; CABG  . Hernia Brother   . Hyperlipidemia Brother   . Hypertension Brother   . Migraines Daughter   . Heart disease Paternal Grandmother   . Esophageal cancer Neg Hx   . Rectal cancer Neg Hx   . Stomach cancer Neg Hx     Social History Social History   Tobacco Use  . Smoking status: Never Smoker  . Smokeless tobacco: Never Used  Substance Use Topics  . Alcohol use: No    Alcohol/week: 0.0 oz    Comment: occasionally  . Drug use: No     Allergies   Avastin [bevacizumab]; Clindamycin/lincomycin; Novocain [procaine]; and Sulfa antibiotics   Review of Systems Review of Systems  Constitutional: Positive for fatigue. Negative for activity change and fever.  HENT: Negative for congestion.   Respiratory: Negative for shortness of breath.   Cardiovascular: Negative for chest pain.  Gastrointestinal: Positive for abdominal pain and diarrhea.     Physical Exam Updated Vital Signs BP 121/64   Pulse 88   Temp 98.2 F (36.8 C) (Oral)   Resp 15   Ht 5\' 4"  (1.626 m)   Wt 89.8 kg (198 lb)   SpO2 100%   BMI 33.99 kg/m   Physical Exam  Constitutional: She appears well-developed and well-nourished.  HENT:  Head: Normocephalic and atraumatic.  Eyes: Right eye exhibits no discharge. Left eye exhibits no discharge.  Cardiovascular: Normal rate and regular rhythm.  No murmur heard. Pulmonary/Chest: Effort normal and breath sounds normal. No respiratory distress.  Skin: Skin is warm and dry. She is not diaphoretic.  Psychiatric:  Odd behavior.  Nursing note and vitals reviewed.    ED Treatments / Results  Labs (all labs ordered are listed, but only abnormal results are displayed) Labs Reviewed  COMPREHENSIVE METABOLIC PANEL - Abnormal; Notable for the following components:      Result  Value   Sodium 132 (*)    Potassium 3.4 (*)    Chloride 93 (*)    All other components within normal limits  URINALYSIS, ROUTINE W REFLEX MICROSCOPIC - Abnormal; Notable for the following components:   Color, Urine STRAW (*)    All other components within normal limits  CBC  POC OCCULT BLOOD, ED  TYPE AND SCREEN  ABO/RH  EKG  EKG Interpretation None       Radiology Ct Abdomen Pelvis W Contrast  Result Date: 06/27/2017 CLINICAL DATA:  Abdominal infection. Right lower quadrant pain and tenderness. Hematochezia. EXAM: CT ABDOMEN AND PELVIS WITH CONTRAST TECHNIQUE: Multidetector CT imaging of the abdomen and pelvis was performed using the standard protocol following bolus administration of intravenous contrast. CONTRAST:  100 cc Isovue-300 intravenous COMPARISON:  12/10/2013 FINDINGS: Lower chest:  No acute finding. Hepatobiliary: No focal liver abnormality.No evidence of biliary obstruction or stone. Pancreas: Unremarkable. Spleen: Unremarkable. Adrenals/Urinary Tract: Negative adrenals. No hydronephrosis or stone. Small bilateral renal cortical low densities that are likely cysts, but too small for confirmatory densitometry. Submucosal fat deposition in the bladder, nonspecific. Stomach/Bowel: Nissen fundoplication. No obstruction or inflammatory bowel wall thickening. Sigmoid and duodenal diverticula, uncomplicated. Slight fat stranding and peritoneal thickening about the sigmoid colon is stable from 2014. No appendicitis. Vascular/Lymphatic: Mild atherosclerosis for age. No mass or adenopathy. Reproductive:No pathologic findings. Other: No ascites or pneumoperitoneum. Fatty bilateral inguinal hernia. Diastasis rectus. Musculoskeletal: No acute or aggressive finding. Exaggerated thoracic kyphosis and lumbar lordosis with L2-3 and L3-4 retrolisthesis. IMPRESSION: 1. No acute finding.  No explanation for right lower quadrant pain. 2. Mild diverticulosis. 3. Fatty bilateral inguinal hernia. 4.  Nissen fundoplication. Electronically Signed   By: Monte Fantasia M.D.   On: 06/27/2017 10:43    Procedures Procedures (including critical care time)  Medications Ordered in ED Medications  iopamidol (ISOVUE-300) 61 % injection 100 mL (100 mLs Intravenous Contrast Given 06/27/17 1017)  ranitidine (ZANTAC) 150 MG/10ML syrup 150 mg (150 mg Oral Given 06/27/17 1100)     Initial Impression / Assessment and Plan / ED Course  I have reviewed the triage vital signs and the nursing notes.  Pertinent labs & imaging results that were available during my care of the patient were reviewed by me and considered in my medical decision making (see chart for details).     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  Patient has her daughter here who plan to communicate.  Even with this patient is very difficult to communicate with.  Patient seems to be concerned about several different things however is also demanding to leave.  I have said repeatedly that I am happy to help her in any way that she would like.  She is concerned because she had some right lower quadrant pain that is now gone away.  She is also concerned because she had a urinary tract infection for which she was on antibiotics.  She also is concerned about all of her medications.  I spent greater than 45 minutes in the room with patient patient's daughter and still it is unclear to me what the chief complaint is.  Patient upset because of the way that her gown fits, did not want to sit in the bed, I have offered to get a CAT scan given that she had some right lower quadrant pain and to run baseline labs and urine.  Patient denies any fever nausea vomiting or constipation.  Does endorse loose stools.  Endorses some amount of blood at some point with some stools.  She is unable to clarify further.  8:20 AM Patient's labs vital signs and physical exam are reassuring.  Will do a CAT scan however it is possible the patient will  leave beforehand.  11:24 AM CT shows no intra-abdominal pathology.  Patient had no more bleeding, or loose stools.  Given that she has a normal  hemoglobin, and normal vital signs, believe stable for discharge.  The small amount of bright red blood per rectum when wiping is likely due to hemorrhoids.  Patient has mildly low sodium potassium chloride, discussed with patient that she needs to discontinue her antihypertensive and follow-up with her primary care physician.  Is all explained via interpreter.  And her daughter is in agreement and understands the plan.  Final Clinical Impressions(s) / ED Diagnoses   Final diagnoses:  Hemorrhoids, unspecified hemorrhoid type    ED Discharge Orders    None       Alaa Mullally, Fredia Sorrow, MD 06/27/17 1125

## 2017-06-27 NOTE — Discharge Instructions (Signed)
Please return if you have any increase in rectal bleeding.  We think that the bleeding is now is likely from a hemorrhoid. Your CT was reassuring.  In addition we found that your labs showed mildly low sodium and potassium. We want you to discontinue your medication that has called atenolol/chlorthalidone.    We want you to follow-up with your primary care physician within 2-3 days to address your blood pressure, redraw your labs, and check in.

## 2017-06-27 NOTE — ED Triage Notes (Signed)
Pt is mute and deaf.  She reports 2 episodes of bright red rectal bleeding tonight with watery and solid stools.  Just finished taking abx for a UTI.  She has been feeling weak x 3 weeks.  She also reports RLQ pain.

## 2017-06-28 ENCOUNTER — Ambulatory Visit: Payer: Medicare Other | Admitting: Urgent Care

## 2017-06-28 NOTE — Telephone Encounter (Signed)
Called patient.  Had to leave a message with the sign language interpretor for her to call me back to get PA started.

## 2017-06-28 NOTE — Telephone Encounter (Signed)
Called to pharmacy.  Pharmacist there told me that was the wrong medication needing PA it is Zolpidem and should go to Dr. Britt Bottom.  I told her this is not his office, and she apologized and said she would send PA to his office.  I thanked her for her time.

## 2017-06-28 NOTE — Telephone Encounter (Signed)
Spoke to pt via sign language interpreter.  She has appt today at 3:40 with Rosario Adie.  She states she needs her sodium rechecked from being in the hospital, and that is what her medication is for.  I will try to contact her pharmacy to inquire about PA.  At this time that is all I can do because they haven't reached out to [place] yet.

## 2017-06-29 ENCOUNTER — Ambulatory Visit (INDEPENDENT_AMBULATORY_CARE_PROVIDER_SITE_OTHER): Payer: Medicare Other | Admitting: Urgent Care

## 2017-06-29 ENCOUNTER — Encounter: Payer: Self-pay | Admitting: Urgent Care

## 2017-06-29 VITALS — BP 130/80 | HR 73 | Temp 98.0°F | Resp 17 | Ht 64.0 in | Wt 196.0 lb

## 2017-06-29 DIAGNOSIS — I10 Essential (primary) hypertension: Secondary | ICD-10-CM | POA: Diagnosis not present

## 2017-06-29 DIAGNOSIS — I878 Other specified disorders of veins: Secondary | ICD-10-CM | POA: Diagnosis not present

## 2017-06-29 DIAGNOSIS — R609 Edema, unspecified: Secondary | ICD-10-CM | POA: Diagnosis not present

## 2017-06-29 MED ORDER — FUROSEMIDE 40 MG PO TABS: 40.0000 mg | ORAL_TABLET | Freq: Every day | ORAL | 3 refills | Status: DC

## 2017-06-29 MED ORDER — MEDICAL COMPRESSION STOCKINGS MISC: 1 refills | Status: DC

## 2017-06-29 NOTE — Progress Notes (Signed)
  MRN: [id number] DOB: 09/13/1935  Subjective:   Melissa Murphy is a 81 y.o. female presenting for follow up after an ER visit for multiple complaints. She was advised to stop her atenolol-chlorthalidone. She was to maintain ramipril. CT was negative for an acute process. Labs were positive for borderline hypokalemia. Today, patient is primarily concerned with lower leg swelling. Has a history of venous stasis. She is supposed to wear compression stockings but is not compliant. She is trying to hydrate better.   Melissa Murphy has a current medication list which includes the following prescription(s): aspirin, bismuth subsalicylate, dorzolamide-timolol, ramipril, ranitidine, simvastatin, and atenolol-chlorthalidone. Also is allergic to avastin [bevacizumab]; clindamycin/lincomycin; novocain [procaine]; and sulfa antibiotics.  Melissa Murphy  has a past medical history of Anemia, Arthritis, Cataract, Clotting disorder ([place]), [place], DVT (deep venous thrombosis) ([place]), Edema, GERD (gastroesophageal reflux disease), Glaucoma, Hernia, Hiatal hernia, Hyperlipidemia, Hypertension, Osteoporosis, Sleep apnea, Thyroid disease, Ulcer, Urinary incontinence, and Uterine polyp. Also  has a past surgical history that includes Cataract extraction; goiter removed; xanthoma tumor removed; Refractive surgery; Dilatation & currettage/hysteroscopy with resectoscope (N/A, 10/11/2012); Eye surgery; Hernia repair; Tonsillectomy; and Hiatal hernia repair (N/A, 12/08/2013).  Objective:   Vitals: BP 130/80   Pulse 73   Temp 98 F (36.7 C) (Oral)   Resp 17   Ht 5\' 4"  (1.626 m)   Wt 196 lb (88.9 kg)   SpO2 98%   BMI 33.64 kg/m   BP Readings from Last 3 Encounters:  06/29/17 130/80  06/27/17 121/64  06/21/17 114/68    Physical Exam  Constitutional: She is oriented to person, place, and time. She appears well-developed and well-nourished.  Cardiovascular: Normal rate.  Pulmonary/Chest: Effort normal.  Musculoskeletal: She exhibits  edema.  Neurological: She is alert and oriented to person, place, and time.   Assessment and Plan :   1. Peripheral edema 2. Venous stasis - Wear compression stocking. Will try to use Lasix for 1 week. Counseled patient on potential for adverse effects with medications prescribed today, patient verbalized understanding. Return-to-clinic precautions discussed, patient verbalized understanding.   3. Essential hypertension - Will manage with ramipril only. F/u as above.  Melissa Eagles, PA-C [facility] [phone] 06/29/2017  4:14 PM

## 2017-06-29 NOTE — Patient Instructions (Addendum)
Please use furosemide for 1 week and then come back for a lab draw to make sure you potassium and blood pressure is doing okay.  Continue taking ramipril for your blood pressure. Do not take atenolol-chlorthalidone again.     Hypertension Hypertension, commonly called high blood pressure, is when the force of blood pumping through the arteries is too strong. The arteries are the blood vessels that carry blood from the heart throughout the body. Hypertension forces the heart to work harder to pump blood and may cause arteries to become narrow or stiff. Having untreated or uncontrolled hypertension can cause heart attacks, strokes, kidney disease, and other problems. A blood pressure reading consists of a higher number over a lower number. Ideally, your blood pressure should be below 120/80. The first ("top") number is called the systolic pressure. It is a measure of the pressure in your arteries as your heart beats. The second ("bottom") number is called the diastolic pressure. It is a measure of the pressure in your arteries as the heart relaxes. What are the causes? The cause of this condition is not known. What increases the risk? Some risk factors for high blood pressure are under your control. Others are not. Factors you can change  Smoking.  Having type 2 diabetes mellitus, high cholesterol, or both.  Not getting enough exercise or physical activity.  Being overweight.  Having too much fat, sugar, calories, or salt (sodium) in your diet.  Drinking too much alcohol. Factors that are difficult or impossible to change  Having chronic kidney disease.  Having a family history of high blood pressure.  Age. Risk increases with age.  Race. You may be at higher risk if you are African-American.  Gender. Men are at higher risk than women before age 40. After age 63, women are at higher risk than men.  Having obstructive sleep apnea.  Stress. What are the signs or  symptoms? Extremely high blood pressure (hypertensive crisis) may cause:  Headache.  Anxiety.  Shortness of breath.  Nosebleed.  Nausea and vomiting.  Severe chest pain.  Jerky movements you cannot control (seizures).  How is this diagnosed? This condition is diagnosed by measuring your blood pressure while you are seated, with your arm resting on a surface. The cuff of the blood pressure monitor will be placed directly against the skin of your upper arm at the level of your heart. It should be measured at least twice using the same arm. Certain conditions can cause a difference in blood pressure between your right and left arms. Certain factors can cause blood pressure readings to be lower or higher than normal (elevated) for a short period of time:  When your blood pressure is higher when you are in a health care provider's office than when you are at home, this is called white coat hypertension. Most people with this condition do not need medicines.  When your blood pressure is higher at home than when you are in a health care provider's office, this is called masked hypertension. Most people with this condition may need medicines to control blood pressure.  If you have a high blood pressure reading during one visit or you have normal blood pressure with other risk factors:  You may be asked to return on a different day to have your blood pressure checked again.  You may be asked to monitor your blood pressure at home for 1 week or longer.  If you are diagnosed with hypertension, you may have other blood  or imaging tests to help your health care provider understand your overall risk for other conditions. How is this treated? This condition is treated by making healthy lifestyle changes, such as eating healthy foods, exercising more, and reducing your alcohol intake. Your health care provider may prescribe medicine if lifestyle changes are not enough to get your blood pressure  under control, and if:  Your systolic blood pressure is above 130.  Your diastolic blood pressure is above 80.  Your personal target blood pressure may vary depending on your medical conditions, your age, and other factors. Follow these instructions at home: Eating and drinking  Eat a diet that is high in fiber and potassium, and low in sodium, added sugar, and fat. An example eating plan is called the DASH (Dietary Approaches to Stop Hypertension) diet. To eat this way: ? Eat plenty of fresh fruits and vegetables. Try to fill half of your plate at each meal with fruits and vegetables. ? Eat whole grains, such as whole wheat pasta, brown rice, or whole grain bread. Fill about one quarter of your plate with whole grains. ? Eat or drink low-fat dairy products, such as skim milk or low-fat yogurt. ? Avoid fatty cuts of meat, processed or cured meats, and poultry with skin. Fill about one quarter of your plate with lean proteins, such as fish, chicken without skin, beans, eggs, and tofu. ? Avoid premade and processed foods. These tend to be higher in sodium, added sugar, and fat.  Reduce your daily sodium intake. Most people with hypertension should eat less than 1,500 mg of sodium a day.  Limit alcohol intake to no more than 1 drink a day for nonpregnant women and 2 drinks a day for men. One drink equals 12 oz of beer, 5 oz of wine, or 1 oz of hard liquor. Lifestyle  Work with your health care provider to maintain a healthy body weight or to lose weight. Ask what an ideal weight is for you.  Get at least 30 minutes of exercise that causes your heart to beat faster (aerobic exercise) most days of the week. Activities may include walking, swimming, or biking.  Include exercise to strengthen your muscles (resistance exercise), such as pilates or lifting weights, as part of your weekly exercise routine. Try to do these types of exercises for 30 minutes at least 3 days a week.  Do not use any  products that contain nicotine or tobacco, such as cigarettes and e-cigarettes. If you need help quitting, ask your health care provider.  Monitor your blood pressure at home as told by your health care provider.  Keep all follow-up visits as told by your health care provider. This is important. Medicines  Take over-the-counter and prescription medicines only as told by your health care provider. Follow directions carefully. Blood pressure medicines must be taken as prescribed.  Do not skip doses of blood pressure medicine. Doing this puts you at risk for problems and can make the medicine less effective.  Ask your health care provider about side effects or reactions to medicines that you should watch for. Contact a health care provider if:  You think you are having a reaction to a medicine you are taking.  You have headaches that keep coming back (recurring).  You feel dizzy.  You have swelling in your ankles.  You have trouble with your vision. Get help right away if:  You develop a severe headache or confusion.  You have unusual weakness or numbness.  You  feel faint.  You have severe pain in your chest or abdomen.  You vomit repeatedly.  You have trouble breathing. Summary  Hypertension is when the force of blood pumping through your arteries is too strong. If this condition is not controlled, it may put you at risk for serious complications.  Your personal target blood pressure may vary depending on your medical conditions, your age, and other factors. For most people, a normal blood pressure is less than 120/80.  Hypertension is treated with lifestyle changes, medicines, or a combination of both. Lifestyle changes include weight loss, eating a healthy, low-sodium diet, exercising more, and limiting alcohol. This information is not intended to replace advice given to you by your health care provider. Make sure you discuss any questions you have with your health care  provider. Document Released: 07/31/2005 Document Revised: 06/28/2016 Document Reviewed: 06/28/2016 Elsevier Interactive Patient Education  2018 [place] more foods that contain a lot of potassium, such as: ? Nuts, such as peanuts and pistachios. ? Seeds, such as sunflower seeds and pumpkin seeds. ? Peas, lentils, and lima beans. ? Whole grain and bran cereals and breads. ? Fresh fruits and vegetables, such as apricots, avocado, bananas, cantaloupe, kiwi, oranges, tomatoes, asparagus, and potatoes. ? Orange juice. ? Tomato juice. ? Red meats. ? Yogurt.  Keep all follow-up visits as told by your health care provider. This is important. Contact a health care provider if:  You have weakness that gets worse.  You feel your heart pounding or racing.  You vomit.  You have diarrhea.  You have diabetes (diabetes mellitus) and you have trouble keeping your blood sugar (glucose) in your target range. Get help right away if:  You have chest pain.  You have shortness of breath.  You have vomiting or diarrhea that lasts for more than 2 days.  You faint. This information is not intended to replace advice given to you by your health care provider. Make sure you discuss any questions you have with your health care provider. Document Released: 07/31/2005 Document Revised: 03/18/2016 Document Reviewed: 03/18/2016 Elsevier Interactive Patient Education  2018 [employer].      IF you received an x-ray today, you will receive an invoice from [facility]. Please contact [facility] at [phone] with questions or concerns regarding your invoice.   IF you received labwork today, you will receive an invoice from [facility]. Please contact [facility] at [phone] with questions or concerns regarding your invoice.   Our billing staff will not be able to assist you with questions regarding bills from these companies.  You will be contacted with the lab results  as soon as they are available. The fastest way to get your results is to activate your My Chart account. Instructions are located on the last page of this paperwork. If you have not heard from [place] regarding the results in 2 weeks, please contact this office.

## 2017-07-04 ENCOUNTER — Telehealth: Payer: Self-pay | Admitting: Internal Medicine

## 2017-07-04 ENCOUNTER — Ambulatory Visit (INDEPENDENT_AMBULATORY_CARE_PROVIDER_SITE_OTHER): Payer: Medicare Other | Admitting: Urgent Care

## 2017-07-04 ENCOUNTER — Encounter: Payer: Self-pay | Admitting: Urgent Care

## 2017-07-04 VITALS — BP 152/82 | HR 58 | Temp 98.2°F | Resp 17 | Ht 64.0 in | Wt 203.0 lb

## 2017-07-04 DIAGNOSIS — R609 Edema, unspecified: Secondary | ICD-10-CM

## 2017-07-04 DIAGNOSIS — R5383 Other fatigue: Secondary | ICD-10-CM | POA: Diagnosis not present

## 2017-07-04 DIAGNOSIS — I10 Essential (primary) hypertension: Secondary | ICD-10-CM

## 2017-07-04 DIAGNOSIS — R030 Elevated blood-pressure reading, without diagnosis of hypertension: Secondary | ICD-10-CM

## 2017-07-04 MED ORDER — METOPROLOL TARTRATE 25 MG PO TABS: 25.0000 mg | ORAL_TABLET | Freq: Every day | ORAL | 1 refills | Status: DC

## 2017-07-04 MED ORDER — CLONIDINE HCL 0.1 MG PO TABS: 0.1000 mg | ORAL_TABLET | Freq: Two times a day (BID) | ORAL | 1 refills | Status: DC

## 2017-07-04 MED ORDER — MEDICAL COMPRESSION STOCKINGS MISC: 1 refills | Status: DC

## 2017-07-04 NOTE — Patient Instructions (Addendum)
Your blood pressure should be less than 140 for the top number. If you see that is persistently higher than that, then we need to see you back to talk about increasing the dose of your medicines. For now, we will start you back on your old blood pressure medications of clonidine (take this twice daily) and metoprolol (take this once daily). Set up a follow up appointment for 4 weeks from now but as stated above if you have persistently elevated blood pressure you need to come back before then. Other symptoms that would need [place] to do a check up are chest pain, shortness of breath, difficulty breathing, cough with brown and dark phlegm.   Lastly, if your blood pressure drops too low, less than 110 for the top number and you feel dizzy, tired, fatigued, then we need to decrease your dose of clonidine to 1 tablet once daily. If your symptoms persist beyond that, then come in for an office visit.    Hypertension Hypertension, commonly called high blood pressure, is when the force of blood pumping through the arteries is too strong. The arteries are the blood vessels that carry blood from the heart throughout the body. Hypertension forces the heart to work harder to pump blood and may cause arteries to become narrow or stiff. Having untreated or uncontrolled hypertension can cause heart attacks, strokes, kidney disease, and other problems. A blood pressure reading consists of a higher number over a lower number. Ideally, your blood pressure should be below 120/80. The first ("top") number is called the systolic pressure. It is a measure of the pressure in your arteries as your heart beats. The second ("bottom") number is called the diastolic pressure. It is a measure of the pressure in your arteries as the heart relaxes. What are the causes? The cause of this condition is not known. What increases the risk? Some risk factors for high blood pressure are under your control. Others are not. Factors you can  change  Smoking.  Having type 2 diabetes mellitus, high cholesterol, or both.  Not getting enough exercise or physical activity.  Being overweight.  Having too much fat, sugar, calories, or salt (sodium) in your diet.  Drinking too much alcohol. Factors that are difficult or impossible to change  Having chronic kidney disease.  Having a family history of high blood pressure.  Age. Risk increases with age.  Race. You may be at higher risk if you are African-American.  Gender. Men are at higher risk than women before age 42. After age 77, women are at higher risk than men.  Having obstructive sleep apnea.  Stress. What are the signs or symptoms? Extremely high blood pressure (hypertensive crisis) may cause:  Headache.  Anxiety.  Shortness of breath.  Nosebleed.  Nausea and vomiting.  Severe chest pain.  Jerky movements you cannot control (seizures).  How is this diagnosed? This condition is diagnosed by measuring your blood pressure while you are seated, with your arm resting on a surface. The cuff of the blood pressure monitor will be placed directly against the skin of your upper arm at the level of your heart. It should be measured at least twice using the same arm. Certain conditions can cause a difference in blood pressure between your right and left arms. Certain factors can cause blood pressure readings to be lower or higher than normal (elevated) for a short period of time:  When your blood pressure is higher when you are in a health care provider's  office than when you are at home, this is called white coat hypertension. Most people with this condition do not need medicines.  When your blood pressure is higher at home than when you are in a health care provider's office, this is called masked hypertension. Most people with this condition may need medicines to control blood pressure.  If you have a high blood pressure reading during one visit or you have  normal blood pressure with other risk factors:  You may be asked to return on a different day to have your blood pressure checked again.  You may be asked to monitor your blood pressure at home for 1 week or longer.  If you are diagnosed with hypertension, you may have other blood or imaging tests to help your health care provider understand your overall risk for other conditions. How is this treated? This condition is treated by making healthy lifestyle changes, such as eating healthy foods, exercising more, and reducing your alcohol intake. Your health care provider may prescribe medicine if lifestyle changes are not enough to get your blood pressure under control, and if:  Your systolic blood pressure is above 130.  Your diastolic blood pressure is above 80.  Your personal target blood pressure may vary depending on your medical conditions, your age, and other factors. Follow these instructions at home: Eating and drinking  Eat a diet that is high in fiber and potassium, and low in sodium, added sugar, and fat. An example eating plan is called the DASH (Dietary Approaches to Stop Hypertension) diet. To eat this way: ? Eat plenty of fresh fruits and vegetables. Try to fill half of your plate at each meal with fruits and vegetables. ? Eat whole grains, such as whole wheat pasta, brown rice, or whole grain bread. Fill about one quarter of your plate with whole grains. ? Eat or drink low-fat dairy products, such as skim milk or low-fat yogurt. ? Avoid fatty cuts of meat, processed or cured meats, and poultry with skin. Fill about one quarter of your plate with lean proteins, such as fish, chicken without skin, beans, eggs, and tofu. ? Avoid premade and processed foods. These tend to be higher in sodium, added sugar, and fat.  Reduce your daily sodium intake. Most people with hypertension should eat less than 1,500 mg of sodium a day.  Limit alcohol intake to no more than 1 drink a day for  nonpregnant women and 2 drinks a day for men. One drink equals 12 oz of beer, 5 oz of wine, or 1 oz of hard liquor. Lifestyle  Work with your health care provider to maintain a healthy body weight or to lose weight. Ask what an ideal weight is for you.  Get at least 30 minutes of exercise that causes your heart to beat faster (aerobic exercise) most days of the week. Activities may include walking, swimming, or biking.  Include exercise to strengthen your muscles (resistance exercise), such as pilates or lifting weights, as part of your weekly exercise routine. Try to do these types of exercises for 30 minutes at least 3 days a week.  Do not use any products that contain nicotine or tobacco, such as cigarettes and e-cigarettes. If you need help quitting, ask your health care provider.  Monitor your blood pressure at home as told by your health care provider.  Keep all follow-up visits as told by your health care provider. This is important. Medicines  Take over-the-counter and prescription medicines only as told  by your health care provider. Follow directions carefully. Blood pressure medicines must be taken as prescribed.  Do not skip doses of blood pressure medicine. Doing this puts you at risk for problems and can make the medicine less effective.  Ask your health care provider about side effects or reactions to medicines that you should watch for. Contact a health care provider if:  You think you are having a reaction to a medicine you are taking.  You have headaches that keep coming back (recurring).  You feel dizzy.  You have swelling in your ankles.  You have trouble with your vision. Get help right away if:  You develop a severe headache or confusion.  You have unusual weakness or numbness.  You feel faint.  You have severe pain in your chest or abdomen.  You vomit repeatedly.  You have trouble breathing. Summary  Hypertension is when the force of blood pumping  through your arteries is too strong. If this condition is not controlled, it may put you at risk for serious complications.  Your personal target blood pressure may vary depending on your medical conditions, your age, and other factors. For most people, a normal blood pressure is less than 120/80.  Hypertension is treated with lifestyle changes, medicines, or a combination of both. Lifestyle changes include weight loss, eating a healthy, low-sodium diet, exercising more, and limiting alcohol. This information is not intended to replace advice given to you by your health care provider. Make sure you discuss any questions you have with your health care provider. Document Released: 07/31/2005 Document Revised: 06/28/2016 Document Reviewed: 06/28/2016 Elsevier Interactive Patient Education  2018 [employer].     IF you received an x-ray today, you will receive an invoice from [facility]. Please contact [facility] at [phone] with questions or concerns regarding your invoice.   IF you received labwork today, you will receive an invoice from [facility]. Please contact [facility] at [phone] with questions or concerns regarding your invoice.   Our billing staff will not be able to assist you with questions regarding bills from these companies.  You will be contacted with the lab results as soon as they are available. The fastest way to get your results is to activate your My Chart account. Instructions are located on the last page of this paperwork. If you have not heard from [place] regarding the results in 2 weeks, please contact this office.

## 2017-07-04 NOTE — Progress Notes (Signed)
   MRN: [id number] DOB: [birth year]  Subjective:   Melissa Murphy is a 81 y.o. female presenting for follow up on Hypertension.   Currently managed with Lasix, ramipril. Patient is not checking blood pressure at home, needs to change batteries on her cuff. Reports that Lasix is not helping with her lower leg swelling. Feels like she is drained, fatigued with being on new blood pressure medications. She would like to switch back to her previous blood pressure medications clonidine and metoprolol. She is okay with maintaining ramipril. Denies dizziness, chest pain, shortness of breath, heart racing, palpitations, nausea, vomiting, abdominal pain, hematuria, lower leg swelling. She is not wearing compression stockings for her lower leg swelling. Denies history of heart failure, dyspnea, does not have a cardiologist.  Melissa Murphy has a current medication list which includes the following prescription(s): aspirin, bismuth subsalicylate, dorzolamide-timolol, medical compression stockings, furosemide, ramipril, ranitidine, simvastatin, and atenolol-chlorthalidone. Also is allergic to avastin [bevacizumab]; clindamycin/lincomycin; novocain [procaine]; and sulfa antibiotics.  Melissa Murphy  has a past medical history of Anemia, Arthritis, Cataract, Clotting disorder ([place]), [place], DVT (deep venous thrombosis) ([place]), Edema, GERD (gastroesophageal reflux disease), Glaucoma, Hernia, Hiatal hernia, Hyperlipidemia, Hypertension, Osteoporosis, Sleep apnea, Thyroid disease, Ulcer, Urinary incontinence, and Uterine polyp. Also  has a past surgical history that includes Cataract extraction; goiter removed; xanthoma tumor removed; Refractive surgery; Dilatation & currettage/hysteroscopy with resectoscope (N/A, 10/11/2012); Eye surgery; Hernia repair; Tonsillectomy; and Hiatal hernia repair (N/A, 12/08/2013).  Objective:   Vitals: BP (!) 152/82   Pulse (!) 58   Temp 98.2 F (36.8 C) (Oral)   Resp 17   Ht 5\' 4"  (1.626 m)   Wt 203 lb  (92.1 kg)   SpO2 98%   BMI 34.84 kg/m   BP Readings from Last 3 Encounters:  07/04/17 (!) 152/82  06/29/17 130/80  06/27/17 121/64    Physical Exam  Constitutional: She is oriented to person, place, and time. She appears well-developed and well-nourished.  Cardiovascular: Normal rate, regular rhythm and intact distal pulses. Exam reveals no gallop and no friction rub.  No murmur heard. Pulmonary/Chest: No respiratory distress. She has no wheezes. She has no rales.  Musculoskeletal: She exhibits edema.  Neurological: She is alert and oriented to person, place, and time.  Skin: Skin is warm and dry.  Psychiatric: She has a normal mood and affect.   Assessment and Plan :   1. Essential hypertension 2. Elevated blood pressure reading 3. Peripheral edema 4. Other fatigue - Will have patient restart clonidine, metoprolol. Strict parameters were given for ER or coming back to the clinic. Otherwise, f/u in 4 weeks.  Melissa Eagles, PA-C [facility] [phone] 07/04/2017  2:45 PM

## 2017-07-04 NOTE — Telephone Encounter (Signed)
We received a communication from the patient's pharmacy about a denial and request for prior authorization:    Pharmacy benefit Patient insurance ID#   Not given Not given         medication reason   Zolpidem (AMBIEN) 10 mg Tablet  non-forumlary     Please respond if you want to proceed with prior authorization or change medication.

## 2017-07-05 LAB — BASIC METABOLIC PANEL
BUN / CREAT RATIO: 19 (ref 12–28)
BUN: 13 mg/dL (ref 8–27)
CALCIUM: 9.1 mg/dL (ref 8.7–10.3)
CHLORIDE: 102 mmol/L (ref 96–106)
CO2: 26 mmol/L (ref 20–29)
CREATININE: 0.67 mg/dL (ref 0.57–1.00)
GFR calc Af Amer: 97 mL/min/{1.73_m2} (ref >59)
GFR calc non Af Amer: 84 mL/min/{1.73_m2} (ref >59)
GLUCOSE: 83 mg/dL (ref 65–99)
Potassium: 4.7 mmol/L (ref 3.5–5.2)
Sodium: 139 mmol/L (ref 134–144)

## 2017-07-09 ENCOUNTER — Encounter: Payer: Self-pay | Admitting: Physician Assistant

## 2017-07-09 DIAGNOSIS — Z79899 Other long term (current) drug therapy: Secondary | ICD-10-CM | POA: Insufficient documentation

## 2017-07-09 NOTE — Telephone Encounter (Signed)
PA has been approved until 07/09/2018. Pharmacy will be advised. Thank you.

## 2017-08-02 ENCOUNTER — Ambulatory Visit: Payer: Medicare Other | Admitting: Urgent Care

## 2017-08-09 ENCOUNTER — Ambulatory Visit (INDEPENDENT_AMBULATORY_CARE_PROVIDER_SITE_OTHER): Payer: Medicare Other | Admitting: Physician Assistant

## 2017-08-09 ENCOUNTER — Ambulatory Visit: Payer: Medicare Other | Admitting: Physician Assistant

## 2017-08-09 ENCOUNTER — Encounter: Payer: Self-pay | Admitting: Physician Assistant

## 2017-08-09 ENCOUNTER — Other Ambulatory Visit: Payer: Self-pay

## 2017-08-09 VITALS — BP 138/88 | HR 60 | Temp 97.6°F | Resp 16 | Ht 64.0 in | Wt 193.0 lb

## 2017-08-09 DIAGNOSIS — R5383 Other fatigue: Secondary | ICD-10-CM | POA: Diagnosis not present

## 2017-08-09 DIAGNOSIS — I10 Essential (primary) hypertension: Secondary | ICD-10-CM | POA: Diagnosis not present

## 2017-08-09 LAB — POCT URINALYSIS DIP (MANUAL ENTRY)
BILIRUBIN UA: NEGATIVE
Glucose, UA: NEGATIVE mg/dL
Ketones, POC UA: NEGATIVE mg/dL
Leukocytes, UA: NEGATIVE
NITRITE UA: NEGATIVE
PH UA: 5.5 (ref 5.0–8.0)
Protein Ur, POC: NEGATIVE mg/dL
RBC UA: NEGATIVE
SPEC GRAV UA: 1.01 (ref 1.010–1.025)
UROBILINOGEN UA: 0.2 U/dL

## 2017-08-09 NOTE — Patient Instructions (Signed)
     IF you received an x-ray today, you will receive an invoice from [facility]. Please contact [facility] at [phone] with questions or concerns regarding your invoice.   IF you received labwork today, you will receive an invoice from [facility]. Please contact [facility] at [phone] with questions or concerns regarding your invoice.   Our billing staff will not be able to assist you with questions regarding bills from these companies.  You will be contacted with the lab results as soon as they are available. The fastest way to get your results is to activate your My Chart account. Instructions are located on the last page of this paperwork. If you have not heard from us regarding the results in 2 weeks, please contact this office.     

## 2017-08-09 NOTE — Progress Notes (Signed)
08/12/2017 10:35 AM   DOB: 1936/01/15 / MRN: [id number]  SUBJECTIVE:  Melissa Murphy is a 81 y.o. female presenting for med check.  She is only taking Altace for HTN at this time.  Denies cough.  Pressures have been largely controlled. She complains of fatigue but this is not new.    She was recently treated for a UTI with keflex. She did take all of the medication and she tells me her urinary urgency and frequency resolved. Culture did show pan sensitivity.   She is allergic to avastin [bevacizumab]; clindamycin/lincomycin; novocain [procaine]; and sulfa antibiotics.  She  has a past medical history of Anemia, Arthritis, Cataract, Clotting disorder ([place]), [place], DVT (deep venous thrombosis) ([place]), Edema, GERD (gastroesophageal reflux disease), Glaucoma, Hernia, Hiatal hernia, Hyperlipidemia, Hypertension, Osteoporosis, Sleep apnea, Thyroid disease, Ulcer, Urinary incontinence, and Uterine polyp.    She  reports that  has never smoked. she has never used smokeless tobacco. She reports that she does not drink alcohol or use drugs. She  reports that she does not currently engage in sexual activity. The patient  has a past surgical history that includes Cataract extraction; goiter removed; xanthoma tumor removed; Refractive surgery; Dilatation & currettage/hysteroscopy with resectoscope (N/A, 10/11/2012); Eye surgery; Hernia repair; Tonsillectomy; and Hiatal hernia repair (N/A, 12/08/2013).  Her family history includes Cancer (age of onset: 3) in her mother; Heart disease in her brother, mother, and paternal grandmother; Heart disease (age of onset: 4) in her father; Hernia in her brother; Hyperlipidemia in her brother; Hypertension in her brother and mother; Migraines in her daughter; Stroke (age of onset: 53) in her mother.  Review of Systems  Constitutional: Negative for chills and fever.  Skin: Negative for itching and rash.    The problem list and medications were reviewed and updated by  myself where necessary and exist elsewhere in the encounter.   OBJECTIVE:  BP 138/88 (BP Location: Left Arm, Patient Position: Sitting, Cuff Size: Large)   Pulse 60   Temp 97.6 F (36.4 C) (Oral)   Resp 16   Ht 5\' 4"  (1.626 m)   Wt 193 lb (87.5 kg)   SpO2 100%   BMI 33.13 kg/m   Wt Readings from Last 3 Encounters:  08/09/17 193 lb (87.5 kg)  07/04/17 203 lb (92.1 kg)  06/29/17 196 lb (88.9 kg)   Lab Results  Component Value Date   WBC 5.5 08/09/2017   HGB 13.2 08/09/2017   HCT 37.4 08/09/2017   MCV 90 08/09/2017   PLT 235 08/09/2017   Lab Results  Component Value Date   CREATININE 0.69 08/09/2017   BUN 22 08/09/2017   NA 133 (L) 08/09/2017   K 2.8 (L) 08/09/2017   CL 85 (L) 08/09/2017   CO2 32 (H) 08/09/2017   Lab Results  Component Value Date   ALT 21 06/27/2017   AST 28 06/27/2017   ALKPHOS 66 06/27/2017   BILITOT 0.4 06/27/2017    Lab Results  Component Value Date   TSH 1.77 04/28/2016    Lab Results  Component Value Date   HGBA1C 5.8 (H) 05/25/2017    Lab Results  Component Value Date   CHOL 200 (H) 05/25/2017   HDL 47 05/25/2017   LDLCALC 106 (H) 05/25/2017   TRIG 236 (H) 05/25/2017   CHOLHDL 4.3 05/25/2017   Physical Exam  Constitutional: She is oriented to person, place, and time. She is active.  Cardiovascular: Normal rate, regular rhythm, S1 normal, S2 normal, normal heart  sounds and intact distal pulses. Exam reveals no gallop, no friction rub and no decreased pulses.  No murmur heard. Pulmonary/Chest: Effort normal. No stridor. No tachypnea. No respiratory distress. She has no wheezes. She has no rales.  Abdominal: She exhibits no distension.  Musculoskeletal: Normal range of motion. She exhibits no edema.  Neurological: She is alert and oriented to person, place, and time.  Skin: Skin is warm.    Results for orders placed or performed in visit on 08/09/17 (from the past 72 hour(s))  CK     Status: None   Collection Time: 08/09/17   5:06 PM  Result Value Ref Range   Total CK 130 24 - 173 U/L  Basic metabolic panel     Status: Abnormal   Collection Time: 08/09/17  5:06 PM  Result Value Ref Range   Glucose 109 (H) 65 - 99 mg/dL   BUN 22 8 - 27 mg/dL   Creatinine, Ser 0.69 0.57 - 1.00 mg/dL   GFR calc non Af Amer 83 >59 mL/min/1.73   GFR calc Af Amer 96 >59 mL/min/1.73   BUN/Creatinine Ratio 32 (H) 12 - 28   Sodium 133 (L) 134 - 144 mmol/L   Potassium 2.8 (L) 3.5 - 5.2 mmol/L   Chloride 85 (L) 96 - 106 mmol/L   CO2 32 (H) 20 - 29 mmol/L   Calcium 9.7 8.7 - 10.3 mg/dL  Iron, TIBC and Ferritin Panel     Status: Abnormal   Collection Time: 08/09/17  5:06 PM  Result Value Ref Range   Total Iron Binding Capacity 297 250 - 450 ug/dL   UIBC 179 118 - 369 ug/dL   Iron 118 27 - 139 ug/dL   Iron Saturation 40 15 - 55 %   Ferritin 157 (H) 15 - 150 ng/mL  CBC     Status: None   Collection Time: 08/09/17  5:06 PM  Result Value Ref Range   WBC 5.5 3.4 - 10.8 x10E3/uL   RBC 4.17 3.77 - 5.28 x10E6/uL   Hemoglobin 13.2 11.1 - 15.9 g/dL   Hematocrit 37.4 34.0 - 46.6 %   MCV 90 79 - 97 fL   MCH 31.7 26.6 - 33.0 pg   MCHC 35.3 31.5 - 35.7 g/dL   RDW 14.0 12.3 - 15.4 %   Platelets 235 150 - 379 x10E3/uL  POCT urinalysis dipstick     Status: None   Collection Time: 08/09/17  5:25 PM  Result Value Ref Range   Color, UA yellow yellow   Clarity, UA clear clear   Glucose, UA negative negative mg/dL   Bilirubin, UA negative negative   Ketones, POC UA negative negative mg/dL   Spec Grav, UA 1.010 1.010 - 1.025   Blood, UA negative negative   pH, UA 5.5 5.0 - 8.0   Protein Ur, POC negative negative mg/dL   Urobilinogen, UA 0.2 0.2 or 1.0 E.U./dL   Nitrite, UA Negative Negative   Leukocytes, UA Negative Negative    No results found.  ASSESSMENT AND PLAN: Best number to leave a message [phone]    Well-controlled hypertension: Potassium is low.  It needs to be rechecked and will plan to do this within a week.   Otherwise no change in the plan and no gross explanation for the fatigue.   Fatigue, unspecified type - Plan: CK, Basic metabolic panel, CBC, Iron, TIBC and Ferritin Panel, POCT urinalysis dipstick     The patient is advised to call or return to  clinic if she does not see an improvement in symptoms, or to seek the care of the closest emergency department if she worsens with the above plan.   Philis Fendt, MHS, PA-C [facility] 08/12/2017 10:35 AM

## 2017-08-10 LAB — BASIC METABOLIC PANEL
BUN/Creatinine Ratio: 32 — ABNORMAL HIGH (ref 12–28)
BUN: 22 mg/dL (ref 8–27)
CALCIUM: 9.7 mg/dL (ref 8.7–10.3)
CHLORIDE: 85 mmol/L — AB (ref 96–106)
CO2: 32 mmol/L — AB (ref 20–29)
Creatinine, Ser: 0.69 mg/dL (ref 0.57–1.00)
GFR calc Af Amer: 96 mL/min/{1.73_m2} (ref >59)
GFR calc non Af Amer: 83 mL/min/{1.73_m2} (ref >59)
Glucose: 109 mg/dL — ABNORMAL HIGH (ref 65–99)
POTASSIUM: 2.8 mmol/L — AB (ref 3.5–5.2)
SODIUM: 133 mmol/L — AB (ref 134–144)

## 2017-08-10 LAB — CBC
HEMATOCRIT: 37.4 % (ref 34.0–46.6)
HEMOGLOBIN: 13.2 g/dL (ref 11.1–15.9)
MCH: 31.7 pg (ref 26.6–33.0)
MCHC: 35.3 g/dL (ref 31.5–35.7)
MCV: 90 fL (ref 79–97)
Platelets: 235 10*3/uL (ref 150–379)
RBC: 4.17 x10E6/uL (ref 3.77–5.28)
RDW: 14 % (ref 12.3–15.4)
WBC: 5.5 10*3/uL (ref 3.4–10.8)

## 2017-08-10 LAB — IRON,TIBC AND FERRITIN PANEL
Ferritin: 157 ng/mL — ABNORMAL HIGH (ref 15–150)
Iron Saturation: 40 % (ref 15–55)
Iron: 118 ug/dL (ref 27–139)
Total Iron Binding Capacity: 297 ug/dL (ref 250–450)
UIBC: 179 ug/dL (ref 118–369)

## 2017-08-10 LAB — CK: CK TOTAL: 130 U/L (ref 24–173)

## 2017-08-15 NOTE — Addendum Note (Signed)
Addended by: Areta Haber on: 08/15/2017 08:33 AM   Modules accepted: Orders

## 2017-09-07 ENCOUNTER — Other Ambulatory Visit: Payer: Self-pay | Admitting: Internal Medicine

## 2017-09-07 DIAGNOSIS — G4700 Insomnia, unspecified: Principal | ICD-10-CM

## 2017-09-07 NOTE — Telephone Encounter (Signed)
Last refill date: 06/07/17  Last Office Visit: 77/9/18    Surescripts request.

## 2017-09-21 ENCOUNTER — Ambulatory Visit (INDEPENDENT_AMBULATORY_CARE_PROVIDER_SITE_OTHER): Payer: Medicare Other | Admitting: Physician Assistant

## 2017-09-21 ENCOUNTER — Encounter: Payer: Self-pay | Admitting: Physician Assistant

## 2017-09-21 ENCOUNTER — Other Ambulatory Visit: Payer: Self-pay

## 2017-09-21 VITALS — BP 104/66 | HR 58 | Temp 97.9°F | Resp 16 | Ht 64.0 in | Wt 189.6 lb

## 2017-09-21 DIAGNOSIS — I10 Essential (primary) hypertension: Secondary | ICD-10-CM

## 2017-09-21 LAB — POCT URINALYSIS DIP (MANUAL ENTRY)
BILIRUBIN UA: NEGATIVE
Blood, UA: NEGATIVE
Glucose, UA: NEGATIVE mg/dL
Nitrite, UA: NEGATIVE
PH UA: 7 (ref 5.0–8.0)
Protein Ur, POC: NEGATIVE mg/dL
SPEC GRAV UA: 1.01 (ref 1.010–1.025)
Urobilinogen, UA: 1 E.U./dL

## 2017-09-21 MED ORDER — FUROSEMIDE 20 MG PO TABS: 10.0000 mg | ORAL_TABLET | Freq: Every day | ORAL | 3 refills | Status: DC

## 2017-09-21 NOTE — Progress Notes (Signed)
09/24/2017 1:58 PM   DOB: 02-19-1936 / MRN: [id number]  SUBJECTIVE:  Melissa Murphy is a 82 y.o. female presenting for blood pressure check.  She has restarted herself on her Tenoretic.  When I first started working with her with regard to her blood pressure she was on 4 different medications.  This included clonidine dosed up to 3 times a day, metoprolol twice daily her ACE inhibitor, and Lasix.  Given the confusion and high likelihood of medication noncompliance I altered her plan.  This included Tenoretic and her ACE inhibitor.  She complied and this made her very lethargic however her leg swelling did go down.  She also developed a UTI.  The Tenoretic was stopped, she completed antibiotics and she seemed to feel better for a while.  She was continued on her ACE inhibitor only and her pressures have been well controlled.  4 days ago she started her Tenoretic back because her legs were swelling.  I have advised her that she may not do this as she did not tolerate the medication the last time he tried it due to fatigue.  She denies dysuria, urgency, frequency today.   Current Outpatient Medications:  .  dorzolamide-timolol (COSOPT) 22.3-6.8 MG/ML ophthalmic solution, Place 1 drop into the right eye 2 (two) times daily., Disp: , Rfl:  .  Elastic Bandages & Supports (MEDICAL COMPRESSION STOCKINGS) MISC, Wear compression stocking daily for lower leg swelling., Disp: 10 each, Rfl: 1 .  ramipril (ALTACE) 10 MG capsule, TAKE ONE CAPSULE BY MOUTH EVERY DAY, Disp: 90 capsule, Rfl: 0 .  simvastatin (ZOCOR) 40 MG tablet, Take 0.5 tablets (20 mg total) by mouth at bedtime., Disp: 90 tablet, Rfl: 3 .  furosemide (LASIX) 20 MG tablet, Take 0.5 tablets (10 mg total) by mouth daily., Disp: 30 tablet, Rfl: 3   She is allergic to avastin [bevacizumab]; clindamycin/lincomycin; novocain [procaine]; and sulfa antibiotics.   She  has a past medical history of Anemia, Arthritis, Cataract, Clotting disorder ([place]),  Deaf, DVT (deep venous thrombosis) ([place]), Edema, GERD (gastroesophageal reflux disease), Glaucoma, Hernia, Hiatal hernia, Hyperlipidemia, Hypertension, Osteoporosis, Sleep apnea, Thyroid disease, Ulcer, Urinary incontinence, and Uterine polyp.    She  reports that  has never smoked. she has never used smokeless tobacco. She reports that she does not drink alcohol or use drugs. She  reports that she does not currently engage in sexual activity. The patient  has a past surgical history that includes Cataract extraction; goiter removed; xanthoma tumor removed; Refractive surgery; Dilatation & currettage/hysteroscopy with resectoscope (N/A, 10/11/2012); Eye surgery; Hernia repair; Tonsillectomy; and Hiatal hernia repair (N/A, 12/08/2013).  Her family history includes Cancer (age of onset: 87) in her mother; Heart disease in her brother, mother, and paternal grandmother; Heart disease (age of onset: 62) in her father; Hernia in her brother; Hyperlipidemia in her brother; Hypertension in her brother and mother; Migraines in her daughter; Stroke (age of onset: 38) in her mother.  Review of Systems  Constitutional: Negative for chills, diaphoresis and fever.  Eyes: Negative.   Respiratory: Negative for cough, hemoptysis, sputum production, shortness of breath and wheezing.   Cardiovascular: Negative for chest pain, orthopnea and leg swelling.  Gastrointestinal: Negative for nausea.  Skin: Negative for rash.  Neurological: Negative for dizziness, sensory change, speech change, focal weakness and headaches.    The problem list and medications were reviewed and updated by myself where necessary and exist elsewhere in the encounter.   OBJECTIVE:  BP 104/66   Pulse Marland Kitchen)  58   Temp 97.9 F (36.6 C)   Resp 16   Ht 5\' 4"  (1.626 m)   Wt 189 lb 9.6 oz (86 kg)   SpO2 92%   BMI 32.54 kg/m   Physical Exam  Constitutional: She is active.  Non-toxic appearance.  Cardiovascular: Normal rate, regular rhythm, S1  normal, S2 normal, normal heart sounds and intact distal pulses. Exam reveals no gallop, no friction rub and no decreased pulses.  No murmur heard. Pulmonary/Chest: Effort normal. No stridor. No tachypnea. No respiratory distress. She has no wheezes. She has no rales.  Abdominal: She exhibits no distension.  Musculoskeletal: She exhibits no edema.  Neurological: She is alert.  Skin: Skin is warm and dry. She is not diaphoretic. No pallor.    Results for orders placed or performed in visit on 09/21/17 (from the past 72 hour(s))  POCT urinalysis dipstick     Status: Abnormal   Collection Time: 09/21/17  4:31 PM  Result Value Ref Range   Color, UA yellow yellow   Clarity, UA clear clear   Glucose, UA negative negative mg/dL   Bilirubin, UA negative negative   Ketones, POC UA trace (5) (A) negative mg/dL   Spec Grav, UA 1.010 1.010 - 1.025   Blood, UA negative negative   pH, UA 7.0 5.0 - 8.0   Protein Ur, POC negative negative mg/dL   Urobilinogen, UA 1.0 0.2 or 1.0 E.U./dL   Nitrite, UA Negative Negative   Leukocytes, UA Small (1+) (A) Negative  Basic Metabolic Panel     Status: Abnormal   Collection Time: 09/21/17  4:59 PM  Result Value Ref Range   Glucose 102 (H) 65 - 99 mg/dL   BUN 19 8 - 27 mg/dL   Creatinine, Ser 0.92 0.57 - 1.00 mg/dL   GFR calc non Af Amer 59 (L) >59 mL/min/1.73   GFR calc Af Amer 68 >59 mL/min/1.73   BUN/Creatinine Ratio 21 12 - 28   Sodium 136 134 - 144 mmol/L   Potassium 2.9 (L) 3.5 - 5.2 mmol/L   Chloride 90 (L) 96 - 106 mmol/L   CO2 29 20 - 29 mmol/L   Calcium 9.3 8.7 - 10.3 mg/dL    No results found.  ASSESSMENT AND PLAN:  Melissa Murphy was seen today for fatigue.  Diagnoses and all orders for this visit:  Essential hypertension: Her potassium has dipped before with chlorthalidone.  I suspect this is the case now.  She is taking an ACE inhibitor and does not miss doses of this.  I have advised that she stop taking the Tenoretic and continue her ACE  inhibitor.  I am starting her on a very low dose of Lasix.  I will bring her back in 2 weeks and we will recheck her potassium and I will recheck her asymptomatic bacteriuria.  She is to complete a blood pressure log which I have outlined and handed her in the clinic. -     POCT urinalysis dipstick -     Basic Metabolic Panel -     furosemide (LASIX) 20 MG tablet; Take 0.5 tablets (10 mg total) by mouth daily.    The patient is advised to call or return to clinic if she does not see an improvement in symptoms, or to seek the care of the closest emergency department if she worsens with the above plan.   Philis Fendt, MHS, PA-C [facility] 09/24/2017 1:58 PM

## 2017-09-21 NOTE — Patient Instructions (Addendum)
Please stop taking Tenoretic.  The only medication you need for high blood pressure is the Altace. We can add a low dose of Lasix.  I will see you back in two weeks.    1) Take your altace as prescribed.  Take one half tab of lasix every day. Take your simvastatin as prescribed.  Come back in two weeks with your blood pressure log.

## 2017-09-22 LAB — BASIC METABOLIC PANEL
BUN / CREAT RATIO: 21 (ref 12–28)
BUN: 19 mg/dL (ref 8–27)
CHLORIDE: 90 mmol/L — AB (ref 96–106)
CO2: 29 mmol/L (ref 20–29)
CREATININE: 0.92 mg/dL (ref 0.57–1.00)
Calcium: 9.3 mg/dL (ref 8.7–10.3)
GFR calc Af Amer: 68 mL/min/{1.73_m2} (ref >59)
GFR calc non Af Amer: 59 mL/min/{1.73_m2} — ABNORMAL LOW (ref >59)
GLUCOSE: 102 mg/dL — AB (ref 65–99)
POTASSIUM: 2.9 mmol/L — AB (ref 3.5–5.2)
SODIUM: 136 mmol/L (ref 134–144)

## 2017-10-15 ENCOUNTER — Encounter: Payer: Self-pay | Admitting: Physician Assistant

## 2017-10-15 ENCOUNTER — Other Ambulatory Visit: Payer: Self-pay

## 2017-10-15 ENCOUNTER — Ambulatory Visit (INDEPENDENT_AMBULATORY_CARE_PROVIDER_SITE_OTHER): Payer: Medicare Other | Admitting: Physician Assistant

## 2017-10-15 VITALS — BP 195/81 | HR 67 | Resp 18 | Ht 64.0 in | Wt 192.2 lb

## 2017-10-15 DIAGNOSIS — R0989 Other specified symptoms and signs involving the circulatory and respiratory systems: Secondary | ICD-10-CM

## 2017-10-15 MED ORDER — FUROSEMIDE 20 MG PO TABS: 20.0000 mg | ORAL_TABLET | Freq: Every day | ORAL | 3 refills | Status: DC

## 2017-10-15 MED ORDER — RAMIPRIL 10 MG PO CAPS: 10.0000 mg | ORAL_CAPSULE | Freq: Two times a day (BID) | ORAL | 3 refills | Status: DC

## 2017-10-15 NOTE — Patient Instructions (Addendum)
Take one of your ramipril in the morning and at night.   Take you lasix every day.    I am glad you have plugged in your phone so I can contact you if I need you.   I will see you back for a BP check in one month.     IF you received an x-ray today, you will receive an invoice from [facility]. Please contact [facility] at [phone] with questions or concerns regarding your invoice.   IF you received labwork today, you will receive an invoice from [facility]. Please contact [facility] at [phone] with questions or concerns regarding your invoice.   Our billing staff will not be able to assist you with questions regarding bills from these companies.  You will be contacted with the lab results as soon as they are available. The fastest way to get your results is to activate your My Chart account. Instructions are located on the last page of this paperwork. If you have not heard from [place] regarding the results in 2 weeks, please contact this office.

## 2017-10-15 NOTE — Progress Notes (Signed)
10/15/2017 5:08 PM   DOB: 1936/02/19 / MRN: [id number]  SUBJECTIVE:  Melissa Murphy is a 82 y.o. female presenting for blood pressure medication titration.  She has a long history of hypertension.  This tends to be labile.  Last time she was here she had placed herself back on her chlorthalidone despite my advice not to take the medication, as previously this medication caused her overwhelming fatigue.  It did however improve her pressure and her ankles were no longer swollen.  She continues taking her Altace 10 mg and is also taking Lasix 20 mg daily, not half a tablet as I had clearly instructed her on the prescription signature.  She has not taken her Lasix today because it makes her use the bathroom.  She tells me her pressure tends to measure around 150/70 with a combination of Altace and 20 mg Lasix.  She is difficult to get in touch with at home but tells me now that she has recently just plugged back in her video phone.  She is allergic to avastin [bevacizumab]; clindamycin/lincomycin; novocain [procaine]; and sulfa antibiotics.   She  has a past medical history of Anemia, Arthritis, Cataract, Clotting disorder ([place]), Deaf, DVT (deep venous thrombosis) ([place]), Edema, GERD (gastroesophageal reflux disease), Glaucoma, Hernia, Hiatal hernia, Hyperlipidemia, Hypertension, Osteoporosis, Sleep apnea, Thyroid disease, Ulcer, Urinary incontinence, and Uterine polyp.    She  reports that  has never smoked. she has never used smokeless tobacco. She reports that she does not drink alcohol or use drugs. She  reports that she does not currently engage in sexual activity. The patient  has a past surgical history that includes Cataract extraction; goiter removed; xanthoma tumor removed; Refractive surgery; Dilatation & currettage/hysteroscopy with resectoscope (N/A, 10/11/2012); Eye surgery; Hernia repair; Tonsillectomy; and Hiatal hernia repair (N/A, 12/08/2013).  Her family history includes Cancer (age of  onset: 38) in her mother; Heart disease in her brother, mother, and paternal grandmother; Heart disease (age of onset: 93) in her father; Hernia in her brother; Hyperlipidemia in her brother; Hypertension in her brother and mother; Migraines in her daughter; Stroke (age of onset: 44) in her mother.  Review of Systems  Constitutional: Negative for chills, diaphoresis and fever.  Eyes: Negative.   Respiratory: Negative for cough, hemoptysis, sputum production, shortness of breath and wheezing.   Cardiovascular: Negative for chest pain, orthopnea and leg swelling.  Gastrointestinal: Negative for nausea.  Skin: Negative for rash.  Neurological: Negative for dizziness, sensory change, speech change, focal weakness and headaches.    The problem list and medications were reviewed and updated by myself where necessary and exist elsewhere in the encounter.   OBJECTIVE:   BP (!) 195/81 (BP Location: Right Arm, Patient Position: Sitting, Cuff Size: Large)   Pulse 67   Resp 18   Ht 5\' 4"  (1.626 m)   Wt 192 lb 3.2 oz (87.2 kg)   SpO2 96%   BMI 32.99 kg/m   Wt Readings from Last 3 Encounters:  10/15/17 192 lb 3.2 oz (87.2 kg)  09/21/17 189 lb 9.6 oz (86 kg)  08/09/17 193 lb (87.5 kg)   BP Readings from Last 3 Encounters:  10/15/17 (!) 195/81  09/21/17 104/66  08/09/17 138/88   Lab Results  Component Value Date   CREATININE 0.92 09/21/2017   BUN 19 09/21/2017   NA 136 09/21/2017   K 2.9 (L) 09/21/2017   CL 90 (L) 09/21/2017   CO2 29 09/21/2017   Lab Results  Component Value  Date   WBC 5.5 08/09/2017   HGB 13.2 08/09/2017   HCT 37.4 08/09/2017   MCV 90 08/09/2017   PLT 235 08/09/2017    Lab Results  Component Value Date   CREATININE 0.92 09/21/2017   BUN 19 09/21/2017   NA 136 09/21/2017   K 2.9 (L) 09/21/2017   CL 90 (L) 09/21/2017   CO2 29 09/21/2017    Lab Results  Component Value Date   ALT 21 06/27/2017   AST 28 06/27/2017   ALKPHOS 66 06/27/2017   BILITOT 0.4  06/27/2017    Lab Results  Component Value Date   TSH 1.77 04/28/2016    Lab Results  Component Value Date   HGBA1C 5.8 (H) 05/25/2017    Lab Results  Component Value Date   CHOL 200 (H) 05/25/2017   HDL 47 05/25/2017   LDLCALC 106 (H) 05/25/2017   TRIG 236 (H) 05/25/2017   CHOLHDL 4.3 05/25/2017     Physical Exam  Constitutional: She is active.  Non-toxic appearance.  Cardiovascular: Normal rate, regular rhythm, S1 normal, S2 normal, normal heart sounds and intact distal pulses. Exam reveals no gallop, no friction rub and no decreased pulses.  No murmur heard. Pulmonary/Chest: Effort normal. No stridor. No tachypnea. No respiratory distress. She has no wheezes. She has no rales.  Abdominal: She exhibits no distension.  Musculoskeletal: She exhibits no edema.  Neurological: She is alert.  Skin: Skin is warm and dry. She is not diaphoretic. No pallor.    No results found for this or any previous visit (from the past 72 hour(s)).  No results found.  ASSESSMENT AND PLAN:  Melissa Murphy was seen today for hypertension.  Diagnoses and all orders for this visit:  Labile hypertension: Pressure on recheck by me 158/74 after some time resting in the room.  Does not have any chest pain, shortness of breath, dyspnea on exertion out of proportion to normal for her.  I will double her ramipril so that she is taking a dose in the morning and a dose at night.  I will continue her furosemide daily.  I have repeated these instructions multiple times with the interpreter in the room.  I am rechecking her renal function panel.  I will see her back in 1 month or sooner if she is not feeling well. -     Renal Function Panel -     furosemide (LASIX) 20 MG tablet; Take 1 tablet (20 mg total) by mouth daily. -     ramipril (ALTACE) 10 MG capsule; Take 1 capsule (10 mg total) by mouth 2 (two) times daily.    The patient is advised to call or return to clinic if she does not see an improvement in  symptoms, or to seek the care of the closest emergency department if she worsens with the above plan.   Philis Fendt, MHS, PA-C [facility] 10/15/2017 5:08 PM

## 2017-10-16 LAB — RENAL FUNCTION PANEL
Albumin: 4 g/dL (ref 3.5–4.8)
BUN/Creatinine Ratio: 19 (ref 12–28)
BUN: 12 mg/dL (ref 8–27)
CO2: 23 mmol/L (ref 20–29)
CREATININE: 0.64 mg/dL (ref 0.57–1.00)
Calcium: 9.6 mg/dL (ref 8.7–10.3)
Chloride: 103 mmol/L (ref 96–106)
GFR, EST AFRICAN AMERICAN: 98 mL/min/{1.73_m2} (ref >59)
GFR, EST NON AFRICAN AMERICAN: 85 mL/min/{1.73_m2} (ref >59)
Glucose: 100 mg/dL — ABNORMAL HIGH (ref 65–99)
Phosphorus: 3.2 mg/dL (ref 2.5–4.5)
Potassium: 4.2 mmol/L (ref 3.5–5.2)
SODIUM: 139 mmol/L (ref 134–144)

## 2017-11-12 ENCOUNTER — Encounter: Payer: Self-pay | Admitting: Physician Assistant

## 2017-11-12 ENCOUNTER — Ambulatory Visit (INDEPENDENT_AMBULATORY_CARE_PROVIDER_SITE_OTHER): Payer: Medicare Other | Admitting: Physician Assistant

## 2017-11-12 ENCOUNTER — Other Ambulatory Visit: Payer: Self-pay

## 2017-11-12 VITALS — BP 148/84 | HR 92 | Temp 97.7°F | Resp 16 | Ht 64.0 in | Wt 191.4 lb

## 2017-11-12 DIAGNOSIS — E785 Hyperlipidemia, unspecified: Secondary | ICD-10-CM

## 2017-11-12 DIAGNOSIS — R0989 Other specified symptoms and signs involving the circulatory and respiratory systems: Secondary | ICD-10-CM | POA: Diagnosis not present

## 2017-11-12 DIAGNOSIS — R7303 Prediabetes: Secondary | ICD-10-CM

## 2017-11-12 MED ORDER — SIMVASTATIN 40 MG PO TABS: 20.0000 mg | ORAL_TABLET | Freq: Every day | ORAL | 3 refills | Status: DC

## 2017-11-12 NOTE — Patient Instructions (Addendum)
Please continue your current medication regimen.      IF you received an x-ray today, you will receive an invoice from [facility]. Please contact [facility] at [phone] with questions or concerns regarding your invoice.   IF you received labwork today, you will receive an invoice from [facility]. Please contact [facility] at [phone] with questions or concerns regarding your invoice.   Our billing staff will not be able to assist you with questions regarding bills from these companies.  You will be contacted with the lab results as soon as they are available. The fastest way to get your results is to activate your My Chart account. Instructions are located on the last page of this paperwork. If you have not heard from [place] regarding the results in 2 weeks, please contact this office.

## 2017-11-12 NOTE — Progress Notes (Signed)
11/12/2017 4:48 PM   DOB: [birth year] / MRN: [id number]  SUBJECTIVE:  Melissa Murphy is a 82 y.o. female presenting for HTN check.  I have asked her to keep a log and to take her medications on the day that she comes in however she has not done this today.    Immunization History  Administered Date(s) Administered  . Influenza,inj,Quad PF,6+ Mos 04/01/2014, 06/28/2015, 04/28/2016, 05/25/2017  . Pneumococcal Conjugate-13 04/01/2014  . Pneumococcal Polysaccharide-23 08/14/2009  . Td 08/14/2009  . Zoster 08/14/2009     She is allergic to avastin [bevacizumab]; clindamycin/lincomycin; novocain [procaine]; and sulfa antibiotics.   She  has a past medical history of Anemia, Arthritis, Cataract, Clotting disorder ([place]), Deaf, DVT (deep venous thrombosis) ([place]), Edema, GERD (gastroesophageal reflux disease), Glaucoma, Hernia, Hiatal hernia, Hyperlipidemia, Hypertension, Osteoporosis, Sleep apnea, Thyroid disease, Ulcer, Urinary incontinence, and Uterine polyp.    She  reports that she has never smoked. She has never used smokeless tobacco. She reports that she does not drink alcohol or use drugs. She  reports that she does not currently engage in sexual activity. The patient  has a past surgical history that includes Cataract extraction; goiter removed; xanthoma tumor removed; Refractive surgery; Dilatation & currettage/hysteroscopy with resectoscope (N/A, 10/11/2012); Eye surgery; Hernia repair; Tonsillectomy; and Hiatal hernia repair (N/A, 12/08/2013).  Her family history includes Cancer (age of onset: 35) in her mother; Heart disease in her brother, mother, and paternal grandmother; Heart disease (age of onset: 51) in her father; Hernia in her brother; Hyperlipidemia in her brother; Hypertension in her brother and mother; Migraines in her daughter; Stroke (age of onset: 102) in her mother.  Review of Systems  Constitutional: Negative for chills, diaphoresis and fever.  Eyes: Negative.     Respiratory: Negative for shortness of breath.   Cardiovascular: Negative for chest pain, orthopnea and leg swelling.  Gastrointestinal: Negative for abdominal pain, blood in stool, constipation, diarrhea, heartburn, melena, nausea and vomiting.  Genitourinary: Negative for flank pain.  Skin: Negative for rash.  Neurological: Negative for dizziness, sensory change, speech change, focal weakness and headaches.    The problem list and medications were reviewed and updated by myself where necessary and exist elsewhere in the encounter.   OBJECTIVE:  BP (!) 148/84   Pulse 92   Temp 97.7 F (36.5 C)   Resp 16   Ht 5\' 4"  (1.626 m)   Wt 191 lb 6.4 oz (86.8 kg)   SpO2 93%   BMI 32.85 kg/m   Physical Exam  Constitutional: She is active.  Non-toxic appearance.  Cardiovascular: Normal rate, regular rhythm, S1 normal, S2 normal, normal heart sounds and intact distal pulses. Exam reveals no gallop, no friction rub and no decreased pulses.  No murmur heard. Pulmonary/Chest: Effort normal. No stridor. No tachypnea. No respiratory distress. She has no wheezes. She has no rales.  Abdominal: She exhibits no distension.  Musculoskeletal: She exhibits no edema.  Neurological: She is alert.  Skin: Skin is warm and dry. She is not diaphoretic. No pallor.    Lab Results  Component Value Date   WBC 5.5 08/09/2017   HGB 13.2 08/09/2017   HCT 37.4 08/09/2017   MCV 90 08/09/2017   PLT 235 08/09/2017    Lab Results  Component Value Date   CREATININE 0.64 10/15/2017   BUN 12 10/15/2017   NA 139 10/15/2017   K 4.2 10/15/2017   CL 103 10/15/2017   CO2 23 10/15/2017    Lab Results  Component Value Date   ALT 21 06/27/2017   AST 28 06/27/2017   ALKPHOS 66 06/27/2017   BILITOT 0.4 06/27/2017    Lab Results  Component Value Date   TSH 1.77 04/28/2016    Lab Results  Component Value Date   HGBA1C 5.8 (H) 05/25/2017    Lab Results  Component Value Date   CHOL 200 (H) 05/25/2017    HDL 47 05/25/2017   LDLCALC 106 (H) 05/25/2017   TRIG 236 (H) 05/25/2017   CHOLHDL 4.3 05/25/2017     No results found for this or any previous visit (from the past 72 hour(s)).  No results found.  ASSESSMENT AND PLAN:  Lavaughn was seen today for hypertension.  Diagnoses and all orders for this visit:  Labile hypertension: She has not taken her medication today.  She does not have a diary with her.  BP reasonable today despite no medication.  Will continue the current plan and have encouraged her to keep a diary. Will see her back in six month. Sooner if pressures are poorly controlled.  -     CBC -     Renal Function Panel  Prediabetes -     Hemoglobin A1c  Dyslipidemia -     Lipid panel -     simvastatin (ZOCOR) 40 MG tablet; Take 0.5 tablets (20 mg total) by mouth at bedtime.    The patient is advised to call or return to clinic if she does not see an improvement in symptoms, or to seek the care of the closest emergency department if she worsens with the above plan.   Philis Fendt, MHS, PA-C [facility] 11/12/2017 4:48 PM

## 2017-11-13 LAB — HEMOGLOBIN A1C
Est. average glucose Bld gHb Est-mCnc: 108 mg/dL
Hgb A1c MFr Bld: 5.4 % (ref 4.8–5.6)

## 2017-11-13 LAB — RENAL FUNCTION PANEL
Albumin: 3.9 g/dL (ref 3.5–4.8)
BUN / CREAT RATIO: 16 (ref 12–28)
BUN: 12 mg/dL (ref 8–27)
CO2: 21 mmol/L (ref 20–29)
CREATININE: 0.77 mg/dL (ref 0.57–1.00)
Calcium: 9.9 mg/dL (ref 8.7–10.3)
Chloride: 102 mmol/L (ref 96–106)
GFR calc Af Amer: 85 mL/min/{1.73_m2} (ref >59)
GFR, EST NON AFRICAN AMERICAN: 74 mL/min/{1.73_m2} (ref >59)
Glucose: 110 mg/dL — ABNORMAL HIGH (ref 65–99)
Phosphorus: 3 mg/dL (ref 2.5–4.5)
Potassium: 3.5 mmol/L (ref 3.5–5.2)
SODIUM: 142 mmol/L (ref 134–144)

## 2017-11-13 LAB — LIPID PANEL
CHOL/HDL RATIO: 2.8 ratio (ref 0.0–4.4)
Cholesterol, Total: 143 mg/dL (ref 100–199)
HDL: 51 mg/dL (ref >39)
LDL Calculated: 59 mg/dL (ref 0–99)
TRIGLYCERIDES: 165 mg/dL — AB (ref 0–149)
VLDL CHOLESTEROL CAL: 33 mg/dL (ref 5–40)

## 2017-11-13 LAB — CBC
Hematocrit: 37.4 % (ref 34.0–46.6)
Hemoglobin: 12.5 g/dL (ref 11.1–15.9)
MCH: 31.2 pg (ref 26.6–33.0)
MCHC: 33.4 g/dL (ref 31.5–35.7)
MCV: 93 fL (ref 79–97)
PLATELETS: 204 10*3/uL (ref 150–379)
RBC: 4.01 x10E6/uL (ref 3.77–5.28)
RDW: 13.4 % (ref 12.3–15.4)
WBC: 5.6 10*3/uL (ref 3.4–10.8)

## 2017-11-28 ENCOUNTER — Ambulatory Visit: Payer: Medicare Other | Admitting: Physician Assistant

## 2017-11-30 ENCOUNTER — Ambulatory Visit: Payer: Medicare Other | Admitting: Physician Assistant

## 2017-12-04 DIAGNOSIS — R350 Frequency of micturition: Secondary | ICD-10-CM | POA: Diagnosis not present

## 2017-12-04 DIAGNOSIS — N3946 Mixed incontinence: Secondary | ICD-10-CM | POA: Diagnosis not present

## 2017-12-06 ENCOUNTER — Other Ambulatory Visit: Payer: Self-pay

## 2017-12-06 ENCOUNTER — Encounter: Payer: Self-pay | Admitting: Physician Assistant

## 2017-12-06 ENCOUNTER — Ambulatory Visit (INDEPENDENT_AMBULATORY_CARE_PROVIDER_SITE_OTHER): Payer: Medicare Other | Admitting: Physician Assistant

## 2017-12-06 VITALS — BP 118/80 | HR 63 | Temp 97.7°F | Resp 18 | Ht 64.0 in | Wt 193.6 lb

## 2017-12-06 DIAGNOSIS — M25561 Pain in right knee: Secondary | ICD-10-CM

## 2017-12-06 DIAGNOSIS — M1711 Unilateral primary osteoarthritis, right knee: Secondary | ICD-10-CM | POA: Diagnosis not present

## 2017-12-06 NOTE — Patient Instructions (Signed)
     IF you received an x-ray today, you will receive an invoice from [facility]. Please contact [facility] at [phone] with questions or concerns regarding your invoice.   IF you received labwork today, you will receive an invoice from [facility]. Please contact [facility] at [phone] with questions or concerns regarding your invoice.   Our billing staff will not be able to assist you with questions regarding bills from these companies.  You will be contacted with the lab results as soon as they are available. The fastest way to get your results is to activate your My Chart account. Instructions are located on the last page of this paperwork. If you have not heard from us regarding the results in 2 weeks, please contact this office.     

## 2017-12-06 NOTE — Progress Notes (Signed)
12/06/2017 2:44 PM   DOB: 07/16/1936 / MRN: [id number]  SUBJECTIVE:  Melissa Murphy is a 82 y.o. female presenting for an injection into the right knee.  States that her right knee has been bothering her a great deal the last 4 to 5 days and now starting to impair her ability to do her daily activities.  Her pain is worse with ambulation.  It is better with rest.  She has had a shot in the past with good results.  She is requesting an injection today into the right knee.  Most recent knee radiograph in 2014 showing osteoarthritis.  She is allergic to avastin [bevacizumab]; clindamycin/lincomycin; novocain [procaine]; and sulfa antibiotics.   She  has a past medical history of Anemia, Arthritis, Cataract, Clotting disorder ([place]), Deaf, DVT (deep venous thrombosis) ([place]), Edema, GERD (gastroesophageal reflux disease), Glaucoma, Hernia, Hiatal hernia, Hyperlipidemia, Hypertension, Osteoporosis, Sleep apnea, Thyroid disease, Ulcer, Urinary incontinence, and Uterine polyp.    She  reports that she has never smoked. She has never used smokeless tobacco. She reports that she does not drink alcohol or use drugs. She  reports that she does not currently engage in sexual activity. The patient  has a past surgical history that includes Cataract extraction; goiter removed; xanthoma tumor removed; Refractive surgery; Dilatation & currettage/hysteroscopy with resectoscope (N/A, 10/11/2012); Eye surgery; Hernia repair; Tonsillectomy; and Hiatal hernia repair (N/A, 12/08/2013).  Her family history includes Cancer (age of onset: 51) in her mother; Heart disease in her brother, mother, and paternal grandmother; Heart disease (age of onset: 29) in her father; Hernia in her brother; Hyperlipidemia in her brother; Hypertension in her brother and mother; Migraines in her daughter; Stroke (age of onset: 81) in her mother.  ROS per HPI  The problem list and medications were reviewed and updated by myself where necessary  and exist elsewhere in the encounter.   OBJECTIVE:  BP 118/80   Pulse 63   Temp 97.7 F (36.5 C) (Oral)   Resp 18   Ht 5\' 4"  (1.626 m)   Wt 193 lb 9.6 oz (87.8 kg)   SpO2 97%   BMI 33.23 kg/m   Physical Exam  Constitutional: She is oriented to person, place, and time. She appears well-developed.  Eyes: Pupils are equal, round, and reactive to light. EOM are normal.  Cardiovascular: Normal rate.  Pulmonary/Chest: Effort normal.  Abdominal: She exhibits no distension.  Musculoskeletal: Normal range of motion. She exhibits tenderness (Bony TTP about the right knee.). She exhibits no edema or deformity.  Neurological: She is alert and oriented to person, place, and time. No cranial nerve deficit.  Skin: Skin is warm and dry. She is not diaphoretic.  Psychiatric: She has a normal mood and affect.  Vitals reviewed.  Procedure risk and benefits discussed and verbal consent obtained.  Sterile prep and drape.  Skin overlying the suprapatellar bursa anesthetized with 3 cc of bupivacaine.  The knee was injected with an 18-gauge needle using a lateral approach into the suprapatellar bursa.  Patient tolerated the procedure without complaint.  Patient with immediate relief of symptoms with ambulation.  No results found for this or any previous visit (from the past 72 hour(s)).  No results found.  ASSESSMENT AND PLAN:  Melissa Murphy was seen today for knee pain.  Diagnoses and all orders for this visit:  Acute pain of right knee: Her left knee is also been bothering her.  Advised that if she does not receive enough anti-inflammation from the right knee  injection today she could come back in about 2 weeks for a left knee injection. -     Cancel: POCT urine pregnancy  Primary osteoarthritis of right knee    The patient is advised to call or return to clinic if she does not see an improvement in symptoms, or to seek the care of the closest emergency department if she worsens with the above plan.    Philis Fendt, MHS, PA-C [facility] 12/06/2017 2:44 PM

## 2017-12-12 ENCOUNTER — Ambulatory Visit: Payer: Medicare Other | Admitting: Physician Assistant

## 2017-12-19 ENCOUNTER — Other Ambulatory Visit: Payer: Self-pay | Admitting: Urgent Care

## 2017-12-21 ENCOUNTER — Ambulatory Visit: Payer: Medicare Other | Admitting: Physician Assistant

## 2018-01-09 ENCOUNTER — Encounter: Payer: Self-pay | Admitting: Family Medicine

## 2018-02-06 ENCOUNTER — Encounter (HOSPITAL_COMMUNITY): Payer: Self-pay | Admitting: Family Medicine

## 2018-02-06 ENCOUNTER — Observation Stay (HOSPITAL_COMMUNITY)
Admission: EM | Admit: 2018-02-06 | Discharge: 2018-02-08 | Disposition: A | Discharge status: Home / Self Care | Payer: Medicare Other | Attending: Internal Medicine | Admitting: Internal Medicine

## 2018-02-06 DIAGNOSIS — E079 Disorder of thyroid, unspecified: Secondary | ICD-10-CM | POA: Diagnosis not present

## 2018-02-06 DIAGNOSIS — I700 Atherosclerosis of aorta: Secondary | ICD-10-CM | POA: Insufficient documentation

## 2018-02-06 DIAGNOSIS — G4733 Obstructive sleep apnea (adult) (pediatric): Secondary | ICD-10-CM | POA: Diagnosis not present

## 2018-02-06 DIAGNOSIS — Z86718 Personal history of other venous thrombosis and embolism: Secondary | ICD-10-CM | POA: Diagnosis not present

## 2018-02-06 DIAGNOSIS — E785 Hyperlipidemia, unspecified: Secondary | ICD-10-CM | POA: Diagnosis not present

## 2018-02-06 DIAGNOSIS — R2681 Unsteadiness on feet: Secondary | ICD-10-CM | POA: Diagnosis not present

## 2018-02-06 DIAGNOSIS — R0989 Other specified symptoms and signs involving the circulatory and respiratory systems: Secondary | ICD-10-CM

## 2018-02-06 DIAGNOSIS — K219 Gastro-esophageal reflux disease without esophagitis: Secondary | ICD-10-CM | POA: Insufficient documentation

## 2018-02-06 DIAGNOSIS — J9811 Atelectasis: Secondary | ICD-10-CM | POA: Diagnosis not present

## 2018-02-06 DIAGNOSIS — Z7982 Long term (current) use of aspirin: Secondary | ICD-10-CM | POA: Insufficient documentation

## 2018-02-06 DIAGNOSIS — Z79899 Other long term (current) drug therapy: Secondary | ICD-10-CM | POA: Diagnosis not present

## 2018-02-06 DIAGNOSIS — I160 Hypertensive urgency: Principal | ICD-10-CM | POA: Diagnosis present

## 2018-02-06 DIAGNOSIS — H9190 Unspecified hearing loss, unspecified ear: Secondary | ICD-10-CM

## 2018-02-06 DIAGNOSIS — Z882 Allergy status to sulfonamides status: Secondary | ICD-10-CM | POA: Diagnosis not present

## 2018-02-06 DIAGNOSIS — I878 Other specified disorders of veins: Secondary | ICD-10-CM | POA: Diagnosis not present

## 2018-02-06 DIAGNOSIS — S8992XA Unspecified injury of left lower leg, initial encounter: Secondary | ICD-10-CM | POA: Diagnosis present

## 2018-02-06 DIAGNOSIS — G4700 Insomnia, unspecified: Secondary | ICD-10-CM | POA: Insufficient documentation

## 2018-02-06 DIAGNOSIS — H905 Unspecified sensorineural hearing loss: Secondary | ICD-10-CM | POA: Insufficient documentation

## 2018-02-06 DIAGNOSIS — I10 Essential (primary) hypertension: Secondary | ICD-10-CM | POA: Diagnosis not present

## 2018-02-06 DIAGNOSIS — R58 Hemorrhage, not elsewhere classified: Secondary | ICD-10-CM | POA: Diagnosis present

## 2018-02-06 DIAGNOSIS — R600 Localized edema: Secondary | ICD-10-CM | POA: Diagnosis not present

## 2018-02-06 DIAGNOSIS — Z881 Allergy status to other antibiotic agents status: Secondary | ICD-10-CM | POA: Diagnosis not present

## 2018-02-06 DIAGNOSIS — S8012XA Contusion of left lower leg, initial encounter: Secondary | ICD-10-CM | POA: Diagnosis not present

## 2018-02-06 MED ORDER — BACITRACIN ZINC 500 UNIT/GM EX OINT
TOPICAL_OINTMENT | Freq: Two times a day (BID) | CUTANEOUS | Status: DC
  Administered 2018-02-07: 21:00:00 via TOPICAL
  Administered 2018-02-07: 1 via TOPICAL
  Administered 2018-02-07 – 2018-02-08 (×2): via TOPICAL
  Filled 2018-02-06: qty 0.9

## 2018-02-06 MED ORDER — AMLODIPINE BESYLATE 5 MG PO TABS
10.0000 mg | ORAL_TABLET | Freq: Once | ORAL | Status: AC
  Administered 2018-02-07: 10 mg via ORAL
  Filled 2018-02-06: qty 2

## 2018-02-06 MED ORDER — KETOROLAC TROMETHAMINE 60 MG/2ML IM SOLN
30.0000 mg | Freq: Once | INTRAMUSCULAR | Status: DC
  Filled 2018-02-06: qty 2

## 2018-02-06 MED ORDER — LIDOCAINE 5 % EX PTCH
1.0000 | MEDICATED_PATCH | CUTANEOUS | Status: DC
  Administered 2018-02-07 – 2018-02-08 (×2): 1 via TRANSDERMAL
  Filled 2018-02-06 (×3): qty 1

## 2018-02-06 MED ORDER — ACETAMINOPHEN 500 MG PO TABS
1000.0000 mg | ORAL_TABLET | Freq: Once | ORAL | Status: AC
  Administered 2018-02-07: 1000 mg via ORAL
  Filled 2018-02-06: qty 2

## 2018-02-06 MED ORDER — TETANUS-DIPHTH-ACELL PERTUSSIS 5-2.5-18.5 LF-MCG/0.5 IM SUSP
0.5000 mL | Freq: Once | INTRAMUSCULAR | Status: AC
  Administered 2018-02-07: 0.5 mL via INTRAMUSCULAR
  Filled 2018-02-06: qty 0.5

## 2018-02-06 NOTE — ED Triage Notes (Signed)
Patient left leg pain with swelling. Patient has a large blue bruise to her lower left leg. Patient states her dog scratched yesterday and she hit her leg on the wheelchair accidentally. Patient is concerned because she had a DVT in the left leg a few years ago.

## 2018-02-06 NOTE — ED Notes (Signed)
Patient is deaf but has denied an interpreter. She wants write and read between staff.

## 2018-02-06 NOTE — ED Notes (Signed)
Interpretor services contacted.

## 2018-02-07 ENCOUNTER — Other Ambulatory Visit: Payer: Self-pay

## 2018-02-07 ENCOUNTER — Emergency Department (HOSPITAL_COMMUNITY): Payer: Medicare Other

## 2018-02-07 ENCOUNTER — Observation Stay (HOSPITAL_BASED_OUTPATIENT_CLINIC_OR_DEPARTMENT_OTHER): Payer: Medicare Other

## 2018-02-07 DIAGNOSIS — H9193 Unspecified hearing loss, bilateral: Secondary | ICD-10-CM | POA: Diagnosis not present

## 2018-02-07 DIAGNOSIS — I160 Hypertensive urgency: Secondary | ICD-10-CM | POA: Diagnosis not present

## 2018-02-07 DIAGNOSIS — J9811 Atelectasis: Secondary | ICD-10-CM | POA: Diagnosis not present

## 2018-02-07 DIAGNOSIS — R58 Hemorrhage, not elsewhere classified: Secondary | ICD-10-CM | POA: Diagnosis not present

## 2018-02-07 DIAGNOSIS — M79609 Pain in unspecified limb: Secondary | ICD-10-CM | POA: Diagnosis not present

## 2018-02-07 DIAGNOSIS — R600 Localized edema: Secondary | ICD-10-CM | POA: Diagnosis not present

## 2018-02-07 DIAGNOSIS — S8992XA Unspecified injury of left lower leg, initial encounter: Secondary | ICD-10-CM

## 2018-02-07 DIAGNOSIS — S8992XD Unspecified injury of left lower leg, subsequent encounter: Secondary | ICD-10-CM

## 2018-02-07 LAB — I-STAT CHEM 8, ED
BUN: 13 mg/dL (ref 8–23)
CALCIUM ION: 1.24 mmol/L (ref 1.15–1.40)
CHLORIDE: 103 mmol/L (ref 98–111)
CREATININE: 0.7 mg/dL (ref 0.44–1.00)
GLUCOSE: 96 mg/dL (ref 70–99)
HCT: 40 % (ref 36.0–46.0)
HEMOGLOBIN: 13.6 g/dL (ref 12.0–15.0)
POTASSIUM: 3.8 mmol/L (ref 3.5–5.1)
Sodium: 142 mmol/L (ref 135–145)
TCO2: 27 mmol/L (ref 22–32)

## 2018-02-07 LAB — URINALYSIS, ROUTINE W REFLEX MICROSCOPIC
Bilirubin Urine: NEGATIVE
Glucose, UA: NEGATIVE mg/dL
Ketones, ur: NEGATIVE mg/dL
Leukocytes, UA: NEGATIVE
Nitrite: NEGATIVE
PROTEIN: NEGATIVE mg/dL
SPECIFIC GRAVITY, URINE: 1.008 (ref 1.005–1.030)
pH: 5 (ref 5.0–8.0)

## 2018-02-07 LAB — CBC WITH DIFFERENTIAL/PLATELET
Basophils Absolute: 0 10*3/uL (ref 0.0–0.1)
Basophils Relative: 0 %
EOS PCT: 3 %
Eosinophils Absolute: 0.1 10*3/uL (ref 0.0–0.7)
HEMATOCRIT: 40.8 % (ref 36.0–46.0)
Hemoglobin: 13.4 g/dL (ref 12.0–15.0)
LYMPHS ABS: 2.4 10*3/uL (ref 0.7–4.0)
LYMPHS PCT: 55 %
MCH: 30.3 pg (ref 26.0–34.0)
MCHC: 32.8 g/dL (ref 30.0–36.0)
MCV: 92.3 fL (ref 78.0–100.0)
MONO ABS: 0.3 10*3/uL (ref 0.1–1.0)
Monocytes Relative: 8 %
NEUTROS ABS: 1.5 10*3/uL — AB (ref 1.7–7.7)
Neutrophils Relative %: 34 %
PLATELETS: 170 10*3/uL (ref 150–400)
RBC: 4.42 MIL/uL (ref 3.87–5.11)
RDW: 13.6 % (ref 11.5–15.5)
WBC: 4.4 10*3/uL (ref 4.0–10.5)

## 2018-02-07 LAB — POCT I-STAT TROPONIN I: TROPONIN I, POC: 0 ng/mL (ref 0.00–0.08)

## 2018-02-07 LAB — GLUCOSE, CAPILLARY: GLUCOSE-CAPILLARY: 183 mg/dL — AB (ref 70–99)

## 2018-02-07 MED ORDER — ONDANSETRON HCL 4 MG/2ML IJ SOLN: 4.0000 mg | Freq: Four times a day (QID) | INTRAMUSCULAR | Status: DC | PRN

## 2018-02-07 MED ORDER — ACETAMINOPHEN 325 MG PO TABS: 650.0000 mg | ORAL_TABLET | Freq: Four times a day (QID) | ORAL | Status: DC | PRN

## 2018-02-07 MED ORDER — SIMVASTATIN 20 MG PO TABS
20.0000 mg | ORAL_TABLET | Freq: Every day | ORAL | Status: DC
  Administered 2018-02-07: 20 mg via ORAL
  Filled 2018-02-07: qty 1

## 2018-02-07 MED ORDER — CLONIDINE HCL 0.1 MG PO TABS
0.1000 mg | ORAL_TABLET | Freq: Once | ORAL | Status: AC
  Administered 2018-02-07: 0.1 mg via ORAL
  Filled 2018-02-07: qty 1

## 2018-02-07 MED ORDER — HYDRALAZINE HCL 20 MG/ML IJ SOLN
10.0000 mg | Freq: Once | INTRAMUSCULAR | Status: AC
  Administered 2018-02-07: 10 mg via INTRAVENOUS
  Filled 2018-02-07: qty 1

## 2018-02-07 MED ORDER — CLONIDINE HCL 0.1 MG PO TABS
0.1000 mg | ORAL_TABLET | Freq: Two times a day (BID) | ORAL | Status: DC
  Administered 2018-02-07 – 2018-02-08 (×3): 0.1 mg via ORAL
  Filled 2018-02-07 (×3): qty 1

## 2018-02-07 MED ORDER — HYDRALAZINE HCL 20 MG/ML IJ SOLN: 10.0000 mg | INTRAMUSCULAR | Status: DC | PRN

## 2018-02-07 MED ORDER — FUROSEMIDE 40 MG PO TABS
20.0000 mg | ORAL_TABLET | Freq: Every day | ORAL | Status: DC
  Administered 2018-02-07 – 2018-02-08 (×2): 20 mg via ORAL
  Filled 2018-02-07 (×2): qty 1

## 2018-02-07 MED ORDER — RAMIPRIL 10 MG PO CAPS
10.0000 mg | ORAL_CAPSULE | Freq: Two times a day (BID) | ORAL | Status: DC
  Administered 2018-02-07 – 2018-02-08 (×3): 10 mg via ORAL
  Filled 2018-02-07 (×5): qty 1

## 2018-02-07 MED ORDER — FESOTERODINE FUMARATE ER 8 MG PO TB24
8.0000 mg | ORAL_TABLET | Freq: Every day | ORAL | Status: DC
  Administered 2018-02-07 – 2018-02-08 (×2): 8 mg via ORAL
  Filled 2018-02-07 (×3): qty 1

## 2018-02-07 MED ORDER — ASPIRIN 81 MG PO CHEW
81.0000 mg | CHEWABLE_TABLET | Freq: Every day | ORAL | Status: DC
  Administered 2018-02-07 – 2018-02-08 (×2): 81 mg via ORAL
  Filled 2018-02-07 (×2): qty 1

## 2018-02-07 MED ORDER — DORZOLAMIDE HCL-TIMOLOL MAL 2-0.5 % OP SOLN
1.0000 [drp] | Freq: Two times a day (BID) | OPHTHALMIC | Status: DC
  Administered 2018-02-07 – 2018-02-08 (×3): 1 [drp] via OPHTHALMIC
  Filled 2018-02-07 (×2): qty 10

## 2018-02-07 MED ORDER — ENOXAPARIN SODIUM 40 MG/0.4ML ~~LOC~~ SOLN
40.0000 mg | SUBCUTANEOUS | Status: DC
  Administered 2018-02-07 – 2018-02-08 (×2): 40 mg via SUBCUTANEOUS
  Filled 2018-02-07 (×2): qty 0.4

## 2018-02-07 MED ORDER — HYDRALAZINE HCL 20 MG/ML IJ SOLN: 5.0000 mg | INTRAMUSCULAR | Status: DC | PRN

## 2018-02-07 MED ORDER — HYDRALAZINE HCL 20 MG/ML IJ SOLN
5.0000 mg | INTRAMUSCULAR | Status: DC | PRN
  Administered 2018-02-07: 5 mg via INTRAVENOUS
  Filled 2018-02-07: qty 1

## 2018-02-07 MED ORDER — ONDANSETRON HCL 4 MG PO TABS: 4.0000 mg | ORAL_TABLET | Freq: Four times a day (QID) | ORAL | Status: DC | PRN

## 2018-02-07 MED ORDER — ACETAMINOPHEN 650 MG RE SUPP: 650.0000 mg | Freq: Four times a day (QID) | RECTAL | Status: DC | PRN

## 2018-02-07 NOTE — ED Notes (Signed)
Called Deaf Interpreter @[phone] 

## 2018-02-07 NOTE — Progress Notes (Signed)
I have seen and assessed patient and agree with Dr. Juleen China assessment and plan.  Patient is a 82 year old female history of hypertension, prior DVT, congenital deafness presenting to the ED with left lower extremity bruising and swelling with ecchymosis and uncontrolled hypertension.  Lower extremity Dopplers pending to rule out DVT.  Blood pressure improved on current regimen.  Follow.  No charge.

## 2018-02-07 NOTE — Progress Notes (Signed)
VASCULAR LAB PRELIMINARY  PRELIMINARY  PRELIMINARY  PRELIMINARY  Bilateral lower extremity venous duplex completed.    Preliminary report:  There is no obvious evidence of DVT or SVT noted in the visualized veins of the bilateral lower extremities.   Melissa Murphy, RVT 02/07/2018, 3:03 PM

## 2018-02-07 NOTE — H&P (Signed)
History and Physical    Coral Timme JEH:[id number] DOB: [birth year] DOA: 02/06/2018  PCP: Tereasa Coop, PA-C  Patient coming from: Home  I have personally briefly reviewed patient's old medical records in [place]  Chief Complaint: HTN, leg swelling  HPI: Melissa Murphy is a 82 y.o. female with medical history significant of HTN, prior DVT, congenital deafness.  Patient presents to the ED with 2 complaints:  First she reports LLE bruising swelling, pain.  Onset 2 days ago.  First scratched by a dog, then bumped on a chair.  Now has ecchymosis.  Has severe pain but is declining pain meds though repeatedly offered in ED.  She is concerned about possible DVT given h/o DVT and clotting disorder in past.  Secondly she reports that she has uncontrolled hypertension.  Her PCP recently reduced her BP meds due to labile blood pressures and her BP is now out of control.  She denies chest pain, SOB, headache.  Just vaguely says that she doesn't feel well.   ED Course: BP initially 256/81.  Improved to 378 systolic after hydralazine and BP meds in ED.   Review of Systems: As per HPI otherwise 10 point review of systems negative.   Past Medical History:  Diagnosis Date  . Anemia   . Arthritis   . Cataract   . Clotting disorder ([place])   . Deaf    Congenital; Requires an interpreter  . DVT (deep venous thrombosis) (HCC)    in eye, and leg  . Edema   . GERD (gastroesophageal reflux disease)   . Glaucoma   . Hernia   . Hiatal hernia   . Hyperlipidemia   . Hypertension   . Osteoporosis   . Sleep apnea   . Thyroid disease   . Ulcer   . Urinary incontinence   . Uterine polyp     Past Surgical History:  Procedure Laterality Date  . CATARACT EXTRACTION     both eyes  . DILATATION & CURRETTAGE/HYSTEROSCOPY WITH RESECTOCOPE N/A 10/11/2012   Procedure: DILATATION & CURETTAGE/HYSTEROSCOPY WITH RESECTOCOPE;  Surgeon: Allena Katz, MD;  Location: [facility];  Service:  Gynecology;  Laterality: N/A;  pt is deaf; please contact daughter Heide Spark [phone]  . EYE SURGERY    . goiter removed     age 80  . HERNIA REPAIR    . HIATAL HERNIA REPAIR N/A 12/08/2013   Procedure: LAPAROSCOPIC REPAIR OF HIATAL HERNIA  ;  Surgeon: [name], MD;  Location: [facility];  Service: General;  Laterality: N/A;  . REFRACTIVE SURGERY    . TONSILLECTOMY    . xanthoma tumor removed       reports that she has never smoked. She has never used smokeless tobacco. She reports that she does not drink alcohol or use drugs.  Allergies  Allergen Reactions  . Avastin [Bevacizumab] Nausea And Vomiting  . Clindamycin/Lincomycin Itching  . Novocain [Procaine] Other (See Comments)    seizures  . Sulfa Antibiotics Other (See Comments)    fever    Family History  Problem Relation Age of Onset  . Cancer Mother 45       colon cancer  . Stroke Mother 33       cause of death  . Heart disease Mother   . Hypertension Mother   . Heart disease Father 20       AMI; cause of death  . Heart disease Brother        AMI  x 2; tobacco abuse; CABG  . Hernia Brother   . Hyperlipidemia Brother   . Hypertension Brother   . Migraines Daughter   . Heart disease Paternal Grandmother   . Esophageal cancer Neg Hx   . Rectal cancer Neg Hx   . Stomach cancer Neg Hx      Prior to Admission medications   Medication Sig Start Date End Date Taking? Authorizing Provider  aspirin 81 MG chewable tablet Chew 81 mg by mouth daily.   Yes [provider]  cloNIDine (CATAPRES) 0.1 MG tablet Take 0.1 mg by mouth 2 (two) times daily.   Yes [provider]  dorzolamide-timolol (COSOPT) 22.3-6.8 MG/ML ophthalmic solution Place 1 drop into the right eye 2 (two) times daily.   Yes [provider]  furosemide (LASIX) 20 MG tablet Take 1 tablet (20 mg total) by mouth daily. 10/15/17  Yes Tereasa Coop, PA-C  ramipril (ALTACE) 10 MG capsule Take 1 capsule (10 mg total) by mouth 2  (two) times daily. 10/15/17  Yes Tereasa Coop, PA-C  simvastatin (ZOCOR) 40 MG tablet Take 0.5 tablets (20 mg total) by mouth at bedtime. 11/12/17  Yes Tereasa Coop, PA-C  tolterodine (DETROL LA) 4 MG 24 hr capsule Take 4 mg by mouth daily.   Yes [provider]    Physical Exam: Vitals:   02/07/18 0100 02/07/18 0102 02/07/18 0245 02/07/18 0400  BP: (!) 256/81 (!) 226/107 (!) 238/88 (!) 216/68  Pulse: 61 60 (!) 51 66  Resp:  18 18 14   Temp:      TempSrc:      SpO2: 98% 93% 98% 92%  Weight:      Height:        Constitutional: NAD, calm, comfortable Eyes: PERRL, lids and conjunctivae normal ENMT: Mucous membranes are moist. Posterior pharynx clear of any exudate or lesions.Normal dentition.  Neck: normal, supple, no masses, no thyromegaly Respiratory: clear to auscultation bilaterally, no wheezing, no crackles. Normal respiratory effort. No accessory muscle use.  Cardiovascular: Regular rate and rhythm, no murmurs / rubs / gallops. No extremity edema. 2+ pedal pulses. No carotid bruits.  Abdomen: no tenderness, no masses palpated. No hepatosplenomegaly. Bowel sounds positive.  Musculoskeletal: no clubbing / cyanosis. No joint deformity upper and lower extremities. Good ROM, no contractures. Normal muscle tone.  Skin: LLE edema, anterior echymosis, no erythema, warmth, discharge, or other obvious infectious findings. Neurologic: CN 2-12 grossly intact. Sensation intact, DTR normal. Strength 5/5 in all 4.  Psychiatric: Normal judgment and insight. Alert and oriented x 3. Normal mood.    Labs on Admission: I have personally reviewed following labs and imaging studies  CBC: Recent Labs  Lab 02/07/18 0418 02/07/18 0427  WBC 4.4  --   NEUTROABS 1.5*  --   HGB 13.4 13.6  HCT 40.8 40.0  MCV 92.3  --   PLT 170  --    Basic Metabolic Panel: Recent Labs  Lab 02/07/18 0427  NA 142  K 3.8  CL 103  GLUCOSE 96  BUN 13  CREATININE 0.70   GFR: Estimated Creatinine  Clearance: 61 mL/min (by C-G formula based on SCr of 0.7 mg/dL). Liver Function Tests: No results for input(s): AST, ALT, ALKPHOS, BILITOT, PROT, ALBUMIN in the last 168 hours. No results for input(s): LIPASE, AMYLASE in the last 168 hours. No results for input(s): AMMONIA in the last 168 hours. Coagulation Profile: No results for input(s): INR, PROTIME in the last 168 hours. Cardiac Enzymes:  No results for input(s): CKTOTAL, CKMB, CKMBINDEX, TROPONINI in the last 168 hours. BNP (last 3 results) No results for input(s): PROBNP in the last 8760 hours. HbA1C: No results for input(s): HGBA1C in the last 72 hours. CBG: No results for input(s): GLUCAP in the last 168 hours. Lipid Profile: No results for input(s): CHOL, HDL, LDLCALC, TRIG, CHOLHDL, LDLDIRECT in the last 72 hours. Thyroid Function Tests: No results for input(s): TSH, T4TOTAL, FREET4, T3FREE, THYROIDAB in the last 72 hours. Anemia Panel: No results for input(s): VITAMINB12, FOLATE, FERRITIN, TIBC, IRON, RETICCTPCT in the last 72 hours. Urine analysis:    Component Value Date/Time   COLORURINE YELLOW 02/07/2018 [place] 02/07/2018 0347   LABSPEC 1.008 02/07/2018 0347   PHURINE 5.0 02/07/2018 0347   GLUCOSEU NEGATIVE 02/07/2018 0347   HGBUR SMALL (A) 02/07/2018 0347   BILIRUBINUR NEGATIVE 02/07/2018 0347   BILIRUBINUR negative 09/21/2017 1631   BILIRUBINUR Negative 03/31/2015 1320   KETONESUR NEGATIVE 02/07/2018 0347   PROTEINUR NEGATIVE 02/07/2018 0347   UROBILINOGEN 1.0 09/21/2017 1631   UROBILINOGEN 0.2 12/03/2013 1530   NITRITE NEGATIVE 02/07/2018 0347   LEUKOCYTESUR NEGATIVE 02/07/2018 0347    Radiological Exams on Admission: Dg Chest 2 View  Result Date: 02/07/2018 CLINICAL DATA:  Hypertension, nonsmoker EXAM: CHEST - 2 VIEW COMPARISON:  None. FINDINGS: Stable cardiomegaly with tortuous atherosclerotic aorta. No overt pulmonary edema, pulmonary consolidation or effusion. Minimal atelectasis is  seen at the left lung base. No acute osseous abnormality. IMPRESSION: Cardiomegaly with tortuous atherosclerotic aorta. Left basilar atelectasis. Electronically Signed   By: Ashley Royalty M.D.   On: 02/07/2018 03:45   Dg Tibia/fibula Left  Result Date: 02/07/2018 CLINICAL DATA:  82 year old female with left lower extremity pain. EXAM: LEFT TIBIA AND FIBULA - 2 VIEW COMPARISON:  None. FINDINGS: There is no acute fracture or dislocation. Mild arthritic changes of the knee. There is diffuse subcutaneous edema. IMPRESSION: 1. No acute osseous pathology. 2. Diffuse subcutaneous edema. Electronically Signed   By: Anner Crete M.D.   On: 02/07/2018 02:26    EKG: Independently reviewed.  Assessment/Plan Principal Problem:   Hypertensive urgency Active Problems:   Deafness   Soft tissue injury of left lower leg    1. HTN urgency - 1. Cont home BP meds 2. Hydralazine PRN for SBP > 200 3. Goal BP systolic not less than 161 for first 24h 4. Tele monitor 2. LLE edema - 1. Soft tissue injury vs DVT vs venous stasis (has h/o this last and PCP says she isnt compliant with compression stockings, office visit note 06/29/17), infection felt less likely with nl WBC, no SIRS, no erythema, etc. 2. Venous US to r/o DVT 3. Deafness - 1. Chronic, requires interpreter or writing for communication.  DVT prophylaxis: Lovenox Code Status: Full code Family Communication: Husband at bedside Disposition Plan: Home after admit Consults called: None Admission status: Place in obs   Deletha Jaffee, [facility] Pager [phone] Only works nights!  If 7AM-7PM, please contact the primary day team physician taking care of patient  [email or web address] Password [facility]  02/07/2018, 6:12 AM

## 2018-02-07 NOTE — ED Provider Notes (Signed)
[place] DEPT Provider Note   CSN: [id number] Arrival date & time: 02/06/18  1952     History   Chief Complaint Chief Complaint  Patient presents with  . Leg Pain    HPI Melissa Murphy is a 82 y.o. female.  The history is provided by the patient.  Leg Pain   This is a new problem. The current episode started 2 days ago. The problem occurs constantly. The problem has not changed since onset.Pain location: left shin first scratched by dog then bumped on a chair now ecchymotic  The quality of the pain is described as aching. The pain is severe. Pertinent negatives include no numbness. The symptoms are aggravated by activity and contact. She has tried nothing for the symptoms. The treatment provided no relief. There has been a history of trauma. Family history is significant for no rheumatoid arthritis.  Hypertension  This is a chronic problem. The current episode started more than 1 week ago. The problem occurs constantly. The problem has been gradually worsening. Pertinent negatives include no chest pain, no abdominal pain and no shortness of breath. Nothing aggravates the symptoms. Nothing relieves the symptoms. The treatment provided no relief.  Has been taken back to 1 BP medication by her PMD as he BP was low but one of the reasons she has come here is BP is up and she is only on one medication.  She is also concerned that the bruise on her shin represents a DVT.    Past Medical History:  Diagnosis Date  . Anemia   . Arthritis   . Cataract   . Clotting disorder ([place])   . Deaf    Congenital; Requires an interpreter  . DVT (deep venous thrombosis) (HCC)    in eye, and leg  . Edema   . GERD (gastroesophageal reflux disease)   . Glaucoma   . Hernia   . Hiatal hernia   . Hyperlipidemia   . Hypertension   . Osteoporosis   . Sleep apnea   . Thyroid disease   . Ulcer   . Urinary incontinence   . Uterine polyp     Patient Active Problem  List   Diagnosis Date Noted  . Deafness 04/03/2014  . Insomnia 04/01/2014  . Hiatal hernia 12/08/2013  . Obstructive sleep apnea 01/10/2013  . Uterine polyp   . Unspecified chronic bronchitis ([place]) 10/02/2012  . 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 Hypertension 04/30/2012  . Edema 09/18/2011  . Anemia 09/18/2011    Past Surgical History:  Procedure Laterality Date  . CATARACT EXTRACTION     both eyes  . DILATATION & CURRETTAGE/HYSTEROSCOPY WITH RESECTOCOPE N/A 10/11/2012   Procedure: DILATATION & CURETTAGE/HYSTEROSCOPY WITH RESECTOCOPE;  Surgeon: Allena Katz, MD;  Location: [facility];  Service: Gynecology;  Laterality: N/A;  pt is deaf; please contact daughter Heide Spark [phone]  . EYE SURGERY    . goiter removed     age 77  . HERNIA REPAIR    . HIATAL HERNIA REPAIR N/A 12/08/2013   Procedure: LAPAROSCOPIC REPAIR OF HIATAL HERNIA  ;  Surgeon: [name], MD;  Location: [facility];  Service: General;  Laterality: N/A;  . REFRACTIVE SURGERY    . TONSILLECTOMY    . xanthoma tumor removed       OB History   None      Home Medications    Prior to Admission medications   Medication Sig Start Date End Date Taking? Authorizing  Provider  aspirin 81 MG chewable tablet Chew 81 mg by mouth daily.   Yes [provider]  cloNIDine (CATAPRES) 0.1 MG tablet Take 0.1 mg by mouth 2 (two) times daily.   Yes [provider]  dorzolamide-timolol (COSOPT) 22.3-6.8 MG/ML ophthalmic solution Place 1 drop into the right eye 2 (two) times daily.   Yes [provider]  furosemide (LASIX) 20 MG tablet Take 1 tablet (20 mg total) by mouth daily. 10/15/17  Yes Tereasa Coop, PA-C  ramipril (ALTACE) 10 MG capsule Take 1 capsule (10 mg total) by mouth 2 (two) times daily. 10/15/17  Yes Tereasa Coop, PA-C  simvastatin (ZOCOR) 40 MG tablet Take 0.5 tablets (20 mg total) by mouth at bedtime. 11/12/17  Yes Tereasa Coop, PA-C  tolterodine (DETROL LA) 4 MG 24 hr  capsule Take 4 mg by mouth daily.   Yes [provider]    Family History Family History  Problem Relation Age of Onset  . Cancer Mother 69       colon cancer  . Stroke Mother 32       cause of death  . Heart disease Mother   . Hypertension Mother   . Heart disease Father 77       AMI; cause of death  . Heart disease Brother        AMI x 2; tobacco abuse; CABG  . Hernia Brother   . Hyperlipidemia Brother   . Hypertension Brother   . Migraines Daughter   . Heart disease Paternal Grandmother   . Esophageal cancer Neg Hx   . Rectal cancer Neg Hx   . Stomach cancer Neg Hx     Social History Social History   Tobacco Use  . Smoking status: Never Smoker  . Smokeless tobacco: Never Used  Substance Use Topics  . Alcohol use: No    Alcohol/week: 0.0 oz    Comment: occasionally  . Drug use: No     Allergies   Avastin [bevacizumab]; Clindamycin/lincomycin; Novocain [procaine]; and Sulfa antibiotics   Review of Systems Review of Systems  Constitutional: Negative for fatigue and fever.  Respiratory: Negative for shortness of breath.   Cardiovascular: Negative for chest pain and palpitations.  Gastrointestinal: Negative for abdominal pain and vomiting.  Genitourinary: Negative for difficulty urinating.  Musculoskeletal: Negative for back pain.  Skin: Positive for color change.  Neurological: Negative for seizures and numbness.  All other systems reviewed and are negative.    Physical Exam Updated Vital Signs BP (!) 238/88   Pulse (!) 51   Temp 97.8 F (36.6 C) (Oral)   Resp 18   Ht 5\' 4"  (1.626 m)   Wt 87.5 kg (193 lb)   SpO2 98%   BMI 33.13 kg/m   Physical Exam  Constitutional: She is oriented to person, place, and time. She appears well-developed and well-nourished. No distress.  HENT:  Head: Normocephalic and atraumatic.  Mouth/Throat: No oropharyngeal exudate.  Eyes: Pupils are equal, round, and reactive to light. Conjunctivae and EOM are  normal.  Neck: Normal range of motion. Neck supple.  Cardiovascular: Normal rate, regular rhythm, normal heart sounds and intact distal pulses.  Pulmonary/Chest: Effort normal and breath sounds normal. No stridor. She has no wheezes. She has no rales.  Abdominal: Soft. Bowel sounds are normal. She exhibits no mass. There is no tenderness. There is no rebound and no guarding.  Musculoskeletal:       Legs: Neurological: She is alert and oriented  to person, place, and time. She displays normal reflexes.  Skin: Skin is warm and dry. Capillary refill takes less than 2 seconds.  Psychiatric: She has a normal mood and affect.     ED Treatments / Results  Labs (all labs ordered are listed, but only abnormal results are displayed) Labs Reviewed  URINALYSIS, ROUTINE W REFLEX MICROSCOPIC - Abnormal; Notable for the following components:      Result Value   Hgb urine dipstick SMALL (*)    Bacteria, UA RARE (*)    All other components within normal limits  CBC WITH DIFFERENTIAL/PLATELET  I-STAT CHEM 8, ED  I-STAT TROPONIN, ED    EKG EKG Interpretation  Date/Time:  Thursday February 07 2018 03:44:59 EDT Ventricular Rate:  53 PR Interval:    QRS Duration: 98 QT Interval:  479 QTC Calculation: 450 R Axis:   1 Text Interpretation:  Sinus rhythm Abnormal R-wave progression, early transition Probable left ventricular hypertrophy Confirmed by Dory Horn) on 02/07/2018 4:24:13 AM   Radiology Dg Chest 2 View  Result Date: 02/07/2018 CLINICAL DATA:  Hypertension, nonsmoker EXAM: CHEST - 2 VIEW COMPARISON:  None. FINDINGS: Stable cardiomegaly with tortuous atherosclerotic aorta. No overt pulmonary edema, pulmonary consolidation or effusion. Minimal atelectasis is seen at the left lung base. No acute osseous abnormality. IMPRESSION: Cardiomegaly with tortuous atherosclerotic aorta. Left basilar atelectasis. Electronically Signed   By: Ashley Royalty M.D.   On: 02/07/2018 03:45   Dg  Tibia/fibula Left  Result Date: 02/07/2018 CLINICAL DATA:  82 year old female with left lower extremity pain. EXAM: LEFT TIBIA AND FIBULA - 2 VIEW COMPARISON:  None. FINDINGS: There is no acute fracture or dislocation. Mild arthritic changes of the knee. There is diffuse subcutaneous edema. IMPRESSION: 1. No acute osseous pathology. 2. Diffuse subcutaneous edema. Electronically Signed   By: Anner Crete M.D.   On: 02/07/2018 02:26    Procedures Procedures (including critical care time)  Medications Ordered in ED Medications  lidocaine (LIDODERM) 5 % 1 patch (1 patch Transdermal Patch Applied 02/07/18 0028)  ketorolac (TORADOL) injection 30 mg (30 mg Intramuscular Refused 02/07/18 0026)  bacitracin ointment (1 application Topical Given 02/07/18 0028)  hydrALAZINE (APRESOLINE) injection 5 mg (has no administration in time range)  amLODipine (NORVASC) tablet 10 mg (10 mg Oral Given 02/07/18 0019)  acetaminophen (TYLENOL) tablet 1,000 mg (1,000 mg Oral Given 02/07/18 0021)  Tdap (BOOSTRIX) injection 0.5 mL (0.5 mLs Intramuscular Given 02/07/18 0022)  cloNIDine (CATAPRES) tablet 0.1 mg (0.1 mg Oral Given 02/07/18 0217)       Final Clinical Impressions(s) / ED Diagnoses   HTN urgency: have tried multiple PO and IV medications   Gioia Ranes, MD 02/07/18 2122

## 2018-02-07 NOTE — ED Notes (Signed)
ED TO INPATIENT HANDOFF REPORT  Name/Age/Gender Melissa Murphy 82 y.o. female  Code Status    Code Status Orders  (From admission, onward)        Start     Ordered   02/07/18 0610  Full code  Continuous     02/07/18 0612    Code Status History    Date Active Date Inactive Code Status Order ID Comments User Context   12/08/2013 1302 12/11/2013 1515 Full Code [id number]  Fanny Skates, MD Inpatient      Home/SNF/Other Home  Chief Complaint leg pain   Level of Care/Admitting Diagnosis ED Disposition    ED Disposition Condition [facility] Area: [facility] [[id number]]  Level of Care: Telemetry [5]  Admit to tele based on following criteria: Other see comments  Comments: HTN urgency  Diagnosis: Hypertensive urgency [831517]  Admitting Physician: Etta Quill [[id number]]  Attending Physician: Etta Quill [[id number]]  PT Class (Do Not Modify): Observation [104]  PT Acc Code (Do Not Modify): Observation [10022]       Medical History Past Medical History:  Diagnosis Date  . Anemia   . Arthritis   . Cataract   . Clotting disorder ([place])   . Deaf    Congenital; Requires an interpreter  . DVT (deep venous thrombosis) (H[facility])    in eye, and leg  . Edema   . GERD (gastroesophageal reflux disease)   . Glaucoma   . Hernia   . Hiatal hernia   . Hyperlipidemia   . Hypertension   . Osteoporosis   . Sleep apnea   . Thyroid disease   . Ulcer   . Urinary incontinence   . Uterine polyp     Allergies Allergies  Allergen Reactions  . Avastin [Bevacizumab] Nausea And Vomiting  . Clindamycin/Lincomycin Itching  . Novocain [Procaine] Other (See Comments)    seizures  . Sulfa Antibiotics Other (See Comments)    fever    IV Location/Drains/Wounds Patient Lines/Drains/Airways Status   Active Line/Drains/Airways    Name:   Placement date:   Placement time:   Site:   Days:   Incision 10/11/12 Perineum   10/11/12    0709     1945   Incision (Closed) 12/08/13 Abdomen   12/08/13    0829     1522   Incision - 5 Ports Abdomen 1: Umbilicus 2: Left;Lateral 3: Left;Lateral;Mid 4: Upper;Mid 5: Right;Lateral   12/08/13    -     1522          Labs/Imaging Results for orders placed or performed during the hospital encounter of 02/06/18 (from the past 48 hour(s))  Urinalysis, Routine w reflex microscopic     Status: Abnormal   Collection Time: 02/07/18  3:47 AM  Result Value Ref Range   Color, Urine YELLOW YELLOW   APPearance CLEAR CLEAR   Specific Gravity, Urine 1.008 1.005 - 1.030   pH 5.0 5.0 - 8.0   Glucose, UA NEGATIVE NEGATIVE mg/dL   Hgb urine dipstick SMALL (A) NEGATIVE   Bilirubin Urine NEGATIVE NEGATIVE   Ketones, ur NEGATIVE NEGATIVE mg/dL   Protein, ur NEGATIVE NEGATIVE mg/dL   Nitrite NEGATIVE NEGATIVE   Leukocytes, UA NEGATIVE NEGATIVE   RBC / HPF 0-5 0 - 5 RBC/hpf   WBC, UA 0-5 0 - 5 WBC/hpf   Bacteria, UA RARE (A) NONE SEEN   Squamous Epithelial / LPF 0-5 0 - 5    Comment: Performed  at [facility], [place] [address], [place], [place] [address]  CBC with Differential/Platelet     Status: Abnormal   Collection Time: 02/07/18  4:18 AM  Result Value Ref Range   WBC 4.4 4.0 - 10.5 K/uL   RBC 4.42 3.87 - 5.11 MIL/uL   Hemoglobin 13.4 12.0 - 15.0 g/dL   HCT 40.8 36.0 - 46.0 %   MCV 92.3 78.0 - 100.0 fL   MCH 30.3 26.0 - 34.0 pg   MCHC 32.8 30.0 - 36.0 g/dL   RDW 13.6 11.5 - 15.5 %   Platelets 170 150 - 400 K/uL   Neutrophils Relative % 34 %   Neutro Abs 1.5 (L) 1.7 - 7.7 K/uL   Lymphocytes Relative 55 %   Lymphs Abs 2.4 0.7 - 4.0 K/uL   Monocytes Relative 8 %   Monocytes Absolute 0.3 0.1 - 1.0 K/uL   Eosinophils Relative 3 %   Eosinophils Absolute 0.1 0.0 - 0.7 K/uL   Basophils Relative 0 %   Basophils Absolute 0.0 0.0 - 0.1 K/uL    Comment: Performed at [facility], [place] [address], [place], [place] [address]  I-stat chem 8, ed     Status: None   Collection  Time: 02/07/18  4:27 AM  Result Value Ref Range   Sodium 142 135 - 145 mmol/L   Potassium 3.8 3.5 - 5.1 mmol/L   Chloride 103 98 - 111 mmol/L   BUN 13 8 - 23 mg/dL   Creatinine, Ser 0.70 0.44 - 1.00 mg/dL   Glucose, Bld 96 70 - 99 mg/dL   Calcium, Ion 1.24 1.15 - 1.40 mmol/L   TCO2 27 22 - 32 mmol/L   Hemoglobin 13.6 12.0 - 15.0 g/dL   HCT 40.0 36.0 - 46.0 %   Dg Chest 2 View  Result Date: 02/07/2018 CLINICAL DATA:  Hypertension, nonsmoker EXAM: CHEST - 2 VIEW COMPARISON:  None. FINDINGS: Stable cardiomegaly with tortuous atherosclerotic aorta. No overt pulmonary edema, pulmonary consolidation or effusion. Minimal atelectasis is seen at the left lung base. No acute osseous abnormality. IMPRESSION: Cardiomegaly with tortuous atherosclerotic aorta. Left basilar atelectasis. Electronically Signed   By: Ashley Royalty M.D.   On: 02/07/2018 03:45   Dg Tibia/fibula Left  Result Date: 02/07/2018 CLINICAL DATA:  82 year old female with left lower extremity pain. EXAM: LEFT TIBIA AND FIBULA - 2 VIEW COMPARISON:  None. FINDINGS: There is no acute fracture or dislocation. Mild arthritic changes of the knee. There is diffuse subcutaneous edema. IMPRESSION: 1. No acute osseous pathology. 2. Diffuse subcutaneous edema. Electronically Signed   By: Anner Crete M.D.   On: 02/07/2018 02:26    Pending Labs Unresulted Labs (From admission, onward)   Start     Ordered   02/08/18 8768  Basic metabolic panel  Tomorrow morning,   R     02/07/18 0612      Vitals/Pain Today's Vitals   02/07/18 0218 02/07/18 0245 02/07/18 0400 02/07/18 0600  BP:  (!) 238/88 (!) 216/68 (!) 158/64  Pulse:  (!) 51 66 (!) 57  Resp:  18 14 13   Temp:      TempSrc:      SpO2:  98% 92% 98%  Weight:      Height:      PainSc: 7        Isolation Precautions No active isolations  Medications Medications  lidocaine (LIDODERM) 5 % 1 patch (1 patch Transdermal Patch Applied 02/07/18 0028)  ketorolac (TORADOL)  injection 30  mg (30 mg Intramuscular Refused 02/07/18 0026)  bacitracin ointment (1 application Topical Given 02/07/18 0028)  cloNIDine (CATAPRES) tablet 0.1 mg (has no administration in time range)  dorzolamide-timolol (COSOPT) 22.3-6.8 MG/ML ophthalmic solution 1 drop (has no administration in time range)  ramipril (ALTACE) capsule 10 mg (has no administration in time range)  furosemide (LASIX) tablet 20 mg (has no administration in time range)  simvastatin (ZOCOR) tablet 20 mg (has no administration in time range)  fesoterodine (TOVIAZ) tablet 8 mg (has no administration in time range)  aspirin chewable tablet 81 mg (has no administration in time range)  hydrALAZINE (APRESOLINE) injection 10 mg (has no administration in time range)  acetaminophen (TYLENOL) tablet 650 mg (has no administration in time range)    Or  acetaminophen (TYLENOL) suppository 650 mg (has no administration in time range)  ondansetron (ZOFRAN) tablet 4 mg (has no administration in time range)    Or  ondansetron (ZOFRAN) injection 4 mg (has no administration in time range)  enoxaparin (LOVENOX) injection 40 mg (has no administration in time range)  amLODipine (NORVASC) tablet 10 mg (10 mg Oral Given 02/07/18 0019)  acetaminophen (TYLENOL) tablet 1,000 mg (1,000 mg Oral Given 02/07/18 0021)  Tdap (BOOSTRIX) injection 0.5 mL (0.5 mLs Intramuscular Given 02/07/18 0022)  cloNIDine (CATAPRES) tablet 0.1 mg (0.1 mg Oral Given 02/07/18 0217)  hydrALAZINE (APRESOLINE) injection 10 mg (10 mg Intravenous Given 02/07/18 0554)    Mobility With wheelchair or person assist

## 2018-02-07 NOTE — ED Notes (Signed)
Patient transported to X-ray 

## 2018-02-07 NOTE — ED Notes (Signed)
EKG given to EDP,Palumbo,MD. For review. 

## 2018-02-08 DIAGNOSIS — H9193 Unspecified hearing loss, bilateral: Secondary | ICD-10-CM | POA: Diagnosis not present

## 2018-02-08 DIAGNOSIS — I160 Hypertensive urgency: Secondary | ICD-10-CM | POA: Diagnosis not present

## 2018-02-08 DIAGNOSIS — S8992XD Unspecified injury of left lower leg, subsequent encounter: Secondary | ICD-10-CM | POA: Diagnosis not present

## 2018-02-08 LAB — BASIC METABOLIC PANEL
ANION GAP: 10 (ref 5–15)
BUN: 12 mg/dL (ref 8–23)
CO2: 24 mmol/L (ref 22–32)
Calcium: 9.4 mg/dL (ref 8.9–10.3)
Chloride: 106 mmol/L (ref 98–111)
Creatinine, Ser: 0.61 mg/dL (ref 0.44–1.00)
GFR calc Af Amer: >60 mL/min (ref >60)
GFR calc non Af Amer: >60 mL/min (ref >60)
GLUCOSE: 79 mg/dL (ref 70–99)
POTASSIUM: 4.1 mmol/L (ref 3.5–5.1)
Sodium: 140 mmol/L (ref 135–145)

## 2018-02-08 MED ORDER — CLONIDINE HCL 0.1 MG PO TABS: 0.1000 mg | ORAL_TABLET | Freq: Two times a day (BID) | ORAL | 0 refills | Status: DC

## 2018-02-08 MED ORDER — ZOLPIDEM TARTRATE 5 MG PO TABS
5.0000 mg | ORAL_TABLET | Freq: Once | ORAL | Status: AC
Start: 2018-02-08 — End: 2018-02-08
  Administered 2018-02-08: 5 mg via ORAL
  Filled 2018-02-08: qty 1

## 2018-02-08 MED ORDER — FUROSEMIDE 20 MG PO TABS: 20.0000 mg | ORAL_TABLET | Freq: Every day | ORAL | 0 refills | Status: DC

## 2018-02-08 MED ORDER — LIDOCAINE 5 % EX PTCH: 1.0000 | MEDICATED_PATCH | CUTANEOUS | 0 refills | Status: DC

## 2018-02-08 NOTE — Progress Notes (Signed)
Pt declined Home Health at present time.

## 2018-02-08 NOTE — Care Management Obs Status (Signed)
[facility] NOTIFICATION   Patient Details  Name: Melissa Murphy MRN: [id number] Date of Birth: [birth year]   Medicare Observation Status Notification Given:  Yes    Purcell Mouton, RN 02/08/2018, 3:27 PM

## 2018-02-08 NOTE — Discharge Summary (Signed)
Physician Discharge Summary  Melissa Murphy FUX:[id number] DOB: Sep 26, 1935 DOA: 02/06/2018  PCP: Tereasa Coop, PA-C  Admit date: 02/06/2018 Discharge date: 02/08/2018  Time spent: 55 minutes  Recommendations for Outpatient Follow-up:  1.  Follow-up with Tereasa Coop, PA-C in 1 to 2 weeks.  On follow-up patient's blood pressure need to be reassessed as well as left lower extremity swelling.  Lower extremity Dopplers were negative for DVT. Patient will need a basic metabolic profile done to follow-up on electrolytes and renal function on follow-up.    Discharge Diagnoses:  Principal Problem:   Hypertensive urgency Active Problems:   Deafness   Soft tissue injury of left lower leg   Discharge Condition: Stable and improved  Diet recommendation: Regular  Filed Weights   02/06/18 2029  Weight: 87.5 kg (193 lb)    History of present illness:  Per Dr. Joyce Copa is a 82 y.o. female with medical history significant of HTN, prior DVT, congenital deafness.  Patient presented to the ED with 2 complaints:  First she reported LLE bruising swelling, pain.  Onset 2 days ago.  First scratched by a dog, then bumped on a chair.  Now has ecchymosis.  Has severe pain but was declining pain meds though repeatedly offered in ED.  She was concerned about possible DVT given h/o DVT and clotting disorder in past.  Secondly she reported that she had uncontrolled hypertension.  Her PCP recently reduced her BP meds due to labile blood pressures and her BP is now out of control.  She denied chest pain, SOB, headache.  Just vaguely stated that she didn't feel well.   ED Course: BP initially 256/81.  Improved to 025 systolic after hydralazine and BP meds in ED.    Hospital Course:  1 hypertensive urgency Patient was noted to have elevated blood pressures on admission of 256/81.  Patient was placed back on home regimen of clonidine, Lasix, and Altace with significant  improvement with blood pressure.  Patient is to follow-up with PCP 1 to 2 weeks post discharge for continued blood pressure management.  Outpatient follow-up.  2.  Left lower extremity swelling/chronic venous stasis changes Patient noted on admission with complaints of left lower extremity swelling.  Concern for soft tissue injury versus DVT versus venous stasis.  It was noted per PCPs last note that patient was not compliant with compression stockings from office visit note of 06/29/2017.  Infection was noted to be less likely as patient had a normal white count with no erythema and only chronic venous stasis changes noted.  Lower extremity Dopplers were done which were negative for DVT.  Outpatient follow-up with PCP.  3 congenital deafness  The rest of patient's chronic medical issues remained stable throughout the hospitalization and patient be discharged in stable and improved condition.   Procedures:  Lower extremity Dopplers 02/07/2018  Chest x-ray 02/07/2018  Plain films of the left tib-fib 02/07/2018  Consultations:  None  Discharge Exam: Vitals:   02/08/18 0509 02/08/18 1235  BP: (!) 189/71 (!) 151/61  Pulse: (!) 51 (!) 52  Resp: 16 16  Temp:  99.7 F (37.6 C)  SpO2: 99% 97%    General: NAD Cardiovascular: RRR Respiratory: CTAB  Discharge Instructions   Discharge Instructions    Diet general   Complete by:  As directed    Increase activity slowly   Complete by:  As directed      Allergies as of 02/08/2018      Reactions  Avastin [bevacizumab] Nausea And Vomiting   Clindamycin/lincomycin Itching   Novocain [procaine] Other (See Comments)   seizures   Sulfa Antibiotics Other (See Comments)   fever      Medication List    TAKE these medications   aspirin 81 MG chewable tablet Chew 81 mg by mouth daily.   cloNIDine 0.1 MG tablet Commonly known as:  CATAPRES Take 1 tablet (0.1 mg total) by mouth 2 (two) times daily.   dorzolamide-timolol 22.3-6.8  MG/ML ophthalmic solution Commonly known as:  COSOPT Place 1 drop into the right eye 2 (two) times daily.   furosemide 20 MG tablet Commonly known as:  LASIX Take 1 tablet (20 mg total) by mouth daily.   lidocaine 5 % Commonly known as:  LIDODERM Place 1 patch onto the skin daily. Remove & Discard patch within 12 hours or as directed by MD   ramipril 10 MG capsule Commonly known as:  ALTACE Take 1 capsule (10 mg total) by mouth 2 (two) times daily.   simvastatin 40 MG tablet Commonly known as:  ZOCOR Take 0.5 tablets (20 mg total) by mouth at bedtime.   tolterodine 4 MG 24 hr capsule Commonly known as:  DETROL LA Take 4 mg by mouth daily.      Allergies  Allergen Reactions  . Avastin [Bevacizumab] Nausea And Vomiting  . Clindamycin/Lincomycin Itching  . Novocain [Procaine] Other (See Comments)    seizures  . Sulfa Antibiotics Other (See Comments)    fever   Follow-up Information    Tereasa Coop, PA-C. Schedule an appointment as soon as possible for a visit in 1 week(s).   Specialty:  Urgent Care Why:  f/u in 1-2 weeks. Contact information: [place] [address] [phone]            The results of significant diagnostics from this hospitalization (including imaging, microbiology, ancillary and laboratory) are listed below for reference.    Significant Diagnostic Studies: Dg Chest 2 View  Result Date: 02/07/2018 CLINICAL DATA:  Hypertension, nonsmoker EXAM: CHEST - 2 VIEW COMPARISON:  None. FINDINGS: Stable cardiomegaly with tortuous atherosclerotic aorta. No overt pulmonary edema, pulmonary consolidation or effusion. Minimal atelectasis is seen at the left lung base. No acute osseous abnormality. IMPRESSION: Cardiomegaly with tortuous atherosclerotic aorta. Left basilar atelectasis. Electronically Signed   By: Ashley Royalty M.D.   On: 02/07/2018 03:45   Dg Tibia/fibula Left  Result Date: 02/07/2018 CLINICAL DATA:  82 year old female with  left lower extremity pain. EXAM: LEFT TIBIA AND FIBULA - 2 VIEW COMPARISON:  None. FINDINGS: There is no acute fracture or dislocation. Mild arthritic changes of the knee. There is diffuse subcutaneous edema. IMPRESSION: 1. No acute osseous pathology. 2. Diffuse subcutaneous edema. Electronically Signed   By: Anner Crete M.D.   On: 02/07/2018 02:26    Microbiology: No results found for this or any previous visit (from the past 240 hour(s)).   Labs: Basic Metabolic Panel: Recent Labs  Lab 02/07/18 0427 02/08/18 0428  NA 142 140  K 3.8 4.1  CL 103 106  CO2  --  24  GLUCOSE 96 79  BUN 13 12  CREATININE 0.70 0.61  CALCIUM  --  9.4   Liver Function Tests: No results for input(s): AST, ALT, ALKPHOS, BILITOT, PROT, ALBUMIN in the last 168 hours. No results for input(s): LIPASE, AMYLASE in the last 168 hours. No results for input(s): AMMONIA in the last 168 hours. CBC: Recent Labs  Lab 02/07/18 0418  02/07/18 0427  WBC 4.4  --   NEUTROABS 1.5*  --   HGB 13.4 13.6  HCT 40.8 40.0  MCV 92.3  --   PLT 170  --    Cardiac Enzymes: No results for input(s): CKTOTAL, CKMB, CKMBINDEX, TROPONINI in the last 168 hours. BNP: BNP (last 3 results) No results for input(s): BNP in the last 8760 hours.  ProBNP (last 3 results) No results for input(s): PROBNP in the last 8760 hours.  CBG: Recent Labs  Lab 02/07/18 1217  GLUCAP 183*       Signed:  Irine Seal MD.  [facility] 02/08/2018, 3:56 PM

## 2018-02-08 NOTE — Evaluation (Signed)
Physical Therapy Evaluation Patient Details Name: Melissa Murphy MRN: [id number] DOB: 1935/09/30 Today's Date: 02/08/2018   History of Present Illness  82 y.o. female admitted with hypertensive urgency. PMH: giant hernia repair, deaf, LLE injury. Family or interpreter needed.    Clinical Impression  Granddaughter served as Astronomer. Patient uses SPC in home. Has never fallen. Will use grocery cart in large store. Modified independent to supervision for mobility at this time. Would benefit from HHPT but refuses these services at this time. Pt admitted with above diagnosis. Pt currently with functional limitations due to the deficits listed below (see PT Problem List). Pt will benefit from skilled PT to increase their independence and safety with mobility to allow discharge to the venue listed below.       Follow Up Recommendations Home health PT;Supervision for mobility/OOB    Equipment Recommendations  None recommended by PT    Recommendations for Other Services       Precautions / Restrictions Precautions Precautions: Fall Restrictions Weight Bearing Restrictions: No      Mobility  Bed Mobility Overal bed mobility: Modified Independent                Transfers Overall transfer level: Needs assistance Equipment used: Quad cane(NBQC) Transfers: Sit to/from [facility] Sit to Stand: Supervision Stand pivot transfers: Supervision          Ambulation/Gait Ambulation/Gait assistance: Supervision Gait Distance (Feet): 250 Feet Assistive device: Quad cane(NBQC) Gait Pattern/deviations: Step-through pattern;Decreased stride length;Decreased stance time - left;Trunk flexed Gait velocity: decr   General Gait Details: decreased height of narrow base quad cane to lowest height. still too high technically but better.  Stairs            Wheelchair Mobility    Modified Rankin (Stroke Patients Only)       Balance Overall balance  assessment: Mild deficits observed, not formally tested                                           Pertinent Vitals/Pain Pain Assessment: Faces Faces Pain Scale: Hurts little more Pain Location: Lt lower leg Pain Descriptors / Indicators: Grimacing Pain Intervention(s): Limited activity within patient's tolerance;Monitored during session    Home Living Family/patient expects to be discharged to:: Private residence Living Arrangements: Spouse/significant other;Children Available Help at Discharge: Family Type of Home: House Home Access: Stairs to enter Entrance Stairs-Rails: Chemical engineer of Steps: 4 Home Layout: One level Home Equipment: Environmental consultant - 2 wheels;Walker - 4 wheels;Cane - quad(NBQC)      Prior Function Level of Independence: Independent with assistive device(s)               Hand Dominance        Extremity/Trunk Assessment   Upper Extremity Assessment Upper Extremity Assessment: Generalized weakness    Lower Extremity Assessment Lower Extremity Assessment: Generalized weakness    Cervical / Trunk Assessment Cervical / Trunk Assessment: Kyphotic  Communication   Communication: Deaf  Cognition Arousal/Alertness: Awake/alert Behavior During Therapy: WFL for tasks assessed/performed Overall Cognitive Status: Within Functional Limits for tasks assessed                                        General Comments      Exercises  Assessment/Plan    PT Assessment Patient needs continued PT services  PT Problem List Decreased strength;Pain;Decreased activity tolerance;Decreased knowledge of use of DME;Decreased safety awareness;Decreased balance;Decreased mobility;Decreased skin integrity       PT Treatment Interventions DME instruction;Therapeutic activities;Gait training;Therapeutic exercise;Patient/family education;Balance training    PT Goals (Current goals can be found in the Care Plan  section)  Acute Rehab PT Goals Patient Stated Goal: go home without home health services PT Goal Formulation: With patient/family Time For Goal Achievement: 02/22/18 Potential to Achieve Goals: Good    Frequency Min 3X/week   Barriers to discharge        Co-evaluation               AM-PAC PT "6 Clicks" Daily Activity  Outcome Measure Difficulty turning over in bed (including adjusting bedclothes, sheets and blankets)?: None Difficulty moving from lying on back to sitting on the side of the bed? : A Little Difficulty sitting down on and standing up from a chair with arms (e.g., wheelchair, bedside commode, etc,.)?: A Little Help needed moving to and from a bed to chair (including a wheelchair)?: A Little Help needed walking in hospital room?: A Little Help needed climbing 3-5 steps with a railing? : A Lot 6 Click Score: 18    End of Session   Activity Tolerance: Patient tolerated treatment well Patient left: in chair;with family/visitor present;with call bell/phone within reach Nurse Communication: Mobility status PT Visit Diagnosis: Unsteadiness on feet (R26.81);Difficulty in walking, not elsewhere classified (R26.2)    Time: 1165-7903 PT Time Calculation (min) (ACUTE ONLY): 25 min   Charges:   PT Evaluation $PT Eval Moderate Complexity: 1 Mod PT Treatments $Gait Training: 8-22 mins   PT G Codes:        Lavonte Palos D. Hartnett-Rands, MS, PT Per [facility] #[id number] 02/08/2018, 4:25 PM

## 2018-02-08 NOTE — Care Management Note (Addendum)
Case Management Note  Patient Details  Name: Melissa Murphy MRN: [id number] Date of Birth: [birth year]  Subjective/Objective:                    Action/Plan: Pt declined [facility]   Expected Discharge Date:                  Expected Discharge Plan:    In-House Referral:     Discharge planning Services  CM Consult  Post Acute Care Choice:    Choice offered to:  Patient  DME Arranged:    DME Agency:     HH Arranged:  [place] Agency:    Status of Service:  Completed, signed off  If discussed at [employer] of Stay Meetings, dates discussed:    Additional CommentsPurcell Mouton, RN 02/08/2018, 3:12 PM

## 2018-02-14 ENCOUNTER — Other Ambulatory Visit: Payer: Self-pay | Admitting: Urgent Care

## 2018-02-15 ENCOUNTER — Ambulatory Visit (INDEPENDENT_AMBULATORY_CARE_PROVIDER_SITE_OTHER): Payer: Medicare Other | Admitting: Physician Assistant

## 2018-02-15 ENCOUNTER — Encounter: Payer: Self-pay | Admitting: Physician Assistant

## 2018-02-15 VITALS — BP 103/62 | HR 76 | Temp 98.0°F | Resp 18 | Ht 64.0 in | Wt 185.4 lb

## 2018-02-15 DIAGNOSIS — R0989 Other specified symptoms and signs involving the circulatory and respiratory systems: Secondary | ICD-10-CM

## 2018-02-15 DIAGNOSIS — M7989 Other specified soft tissue disorders: Secondary | ICD-10-CM | POA: Diagnosis not present

## 2018-02-15 MED ORDER — DOXYCYCLINE HYCLATE 100 MG PO CAPS: 100.0000 mg | ORAL_CAPSULE | Freq: Two times a day (BID) | ORAL | 0 refills | Status: DC

## 2018-02-15 NOTE — Telephone Encounter (Signed)
Lopressor 25 mg refill request  Has OV today 02/15/18 at 4:20 with Philis Fendt.   Refill during OV in case of changes

## 2018-02-15 NOTE — Patient Instructions (Addendum)
Please check your pressure often. Come back for measures consistently about 140/90. Please purchase some compression stockings and wear them any time your up on your feet. Please elevate your feet as often as possible. Please come back in next Friday so I can check the leg again.     IF you received an x-ray today, you will receive an invoice from [facility]. Please contact [facility] at [phone] with questions or concerns regarding your invoice.   IF you received labwork today, you will receive an invoice from [facility]. Please contact [facility] at [phone] with questions or concerns regarding your invoice.   Our billing staff will not be able to assist you with questions regarding bills from these companies.  You will be contacted with the lab results as soon as they are available. The fastest way to get your results is to activate your My Chart account. Instructions are located on the last page of this paperwork. If you have not heard from [place] regarding the results in 2 weeks, please contact this office.

## 2018-02-15 NOTE — Progress Notes (Signed)
02/16/2018 8:14 AM   DOB: [birth year] / MRN: [id number]  SUBJECTIVE:  Melissa Murphy is a 82 y.o. female presenting for hospital follow-up secondary to essentially elevated blood pressure.  Patient is concerned about some swelling in her left leg and was found to have hypertensive urgency at the ED.  She has had a blood clot in the left leg before and was concerned that this had returned which is initially why she presented to the ED.  She was later admitted when the ED provider was unable to drop her blood pressure.  Both legs were scanned at that time for DVT and negative.  She reports that she feels well today but her leg continues to hurt.  She thinks she needs antibiotics.  She has a history of venous stasis and does not wear compression stockings.  I have urged her in the past to please get some compression stockings however she has never followed through with this despite continually complaining about her leg swelling.  She is back on her old BP regimen at this time with the addition of clonidine twice daily.  Her last dose was about 2 hours ago.  She denies dizziness, chest pain, shortness of breath, DOE.  She is allergic to avastin [bevacizumab]; clindamycin/lincomycin; novocain [procaine]; and sulfa antibiotics.   She  has a past medical history of Anemia, Arthritis, Cataract, Clotting disorder ([place]), Deaf, DVT (deep venous thrombosis) ([place]), Edema, GERD (gastroesophageal reflux disease), Glaucoma, Hernia, Hiatal hernia, Hyperlipidemia, Hypertension, Osteoporosis, Sleep apnea, Thyroid disease, Ulcer, Urinary incontinence, and Uterine polyp.    She  reports that she has never smoked. She has never used smokeless tobacco. She reports that she does not drink alcohol or use drugs. She  reports that she does not currently engage in sexual activity. The patient  has a past surgical history that includes Cataract extraction; goiter removed; xanthoma tumor removed; Refractive surgery; Dilatation &  currettage/hysteroscopy with resectoscope (N/A, 10/11/2012); Eye surgery; Hernia repair; Tonsillectomy; and Hiatal hernia repair (N/A, 12/08/2013).  Her family history includes Cancer (age of onset: 58) in her mother; Heart disease in her brother, mother, and paternal grandmother; Heart disease (age of onset: 39) in her father; Hernia in her brother; Hyperlipidemia in her brother; Hypertension in her brother and mother; Migraines in her daughter; Stroke (age of onset: 80) in her mother.  ROS per HPI  The problem list and medications were reviewed and updated by myself where necessary and exist elsewhere in the encounter.   OBJECTIVE:  BP 103/62   Pulse 76   Temp 98 F (36.7 C) (Oral)   Resp 18   Ht 5\' 4"  (1.626 m)   Wt 185 lb 6.4 oz (84.1 kg)   SpO2 96%   BMI 31.82 kg/m   Wt Readings from Last 3 Encounters:  02/15/18 185 lb 6.4 oz (84.1 kg)  02/06/18 193 lb (87.5 kg)  12/06/17 193 lb 9.6 oz (87.8 kg)   Temp Readings from Last 3 Encounters:  02/15/18 98 F (36.7 C) (Oral)  02/08/18 99.7 F (37.6 C) (Oral)  12/06/17 97.7 F (36.5 C) (Oral)   BP Readings from Last 3 Encounters:  02/15/18 103/62  02/08/18 (!) 151/61  12/06/17 118/80   Pulse Readings from Last 3 Encounters:  02/15/18 76  02/08/18 (!) 52  12/06/17 63    Physical Exam  Constitutional: She is oriented to person, place, and time. She appears well-nourished. No distress.  Eyes: Pupils are equal, round, and reactive to light. EOM are  normal.  Cardiovascular: Normal rate, regular rhythm, S1 normal, S2 normal, normal heart sounds and intact distal pulses. Exam reveals no gallop, no friction rub and no decreased pulses.  No murmur heard. Pulmonary/Chest: Effort normal. No stridor. No respiratory distress. She has no wheezes. She has no rales.  Abdominal: She exhibits no distension.  Musculoskeletal: She exhibits tenderness (Left greater than right with darkened erythema superimposed on a venous stasis rash.  Leg is  tender and mildly warm.). She exhibits no edema.  Neurological: She is alert and oriented to person, place, and time. No cranial nerve deficit. Gait normal.  Skin: Skin is dry. She is not diaphoretic.  Psychiatric: She has a normal mood and affect.  Vitals reviewed.   Lab Results  Component Value Date   HGBA1C 5.4 11/12/2017    Lab Results  Component Value Date   WBC 4.4 02/07/2018   HGB 13.6 02/07/2018   HCT 40.0 02/07/2018   MCV 92.3 02/07/2018   PLT 170 02/07/2018    Lab Results  Component Value Date   CREATININE 0.87 02/15/2018   BUN 10 02/15/2018   NA 140 02/15/2018   K 3.4 (L) 02/15/2018   CL 103 02/15/2018   CO2 23 02/15/2018    Lab Results  Component Value Date   ALT 10 02/15/2018   AST 15 02/15/2018   ALKPHOS 64 02/15/2018   BILITOT 0.3 02/15/2018    Lab Results  Component Value Date   TSH 1.77 04/28/2016    Lab Results  Component Value Date   CHOL 143 11/12/2017   HDL 51 11/12/2017   LDLCALC 59 11/12/2017   TRIG 165 (H) 11/12/2017   CHOLHDL 2.8 11/12/2017     ASSESSMENT AND PLAN:  Orabelle was seen today for hospitalization follow-up and leg pain.  Diagnoses and all orders for this visit:  Labile hypertension: I urged her to please monitor her blood pressure on an outpatient basis and to keep a log for follow-ups.  Essentially the only thing that is changed from her blood pressure regimen prehospitalization is the addition of clonidine.  She may benefit from a referral to the hypertension clinic. -     CMP and Liver  Left leg swelling: Low-grade cellulitis most likely secondary to history of venous stasis.  Both legs scanned in the hospital and negative for DVT.  Starting doxycycline and would like to see her back in about a week.  Once again I have urged her to get some compression stockings. -     doxycycline (VIBRAMYCIN) 100 MG capsule; Take 1 capsule (100 mg total) by mouth 2 (two) times daily for 10 days.    The patient is advised to  call or return to clinic if she does not see an improvement in symptoms, or to seek the care of the closest emergency department if she worsens with the above plan.   Philis Fendt, MHS, PA-C [facility] 02/16/2018 8:14 AM

## 2018-02-16 LAB — CMP AND LIVER
ALBUMIN: 3.9 g/dL (ref 3.5–4.8)
ALT: 10 IU/L (ref 0–32)
AST: 15 IU/L (ref 0–40)
Alkaline Phosphatase: 64 IU/L (ref 39–117)
BUN: 10 mg/dL (ref 8–27)
Bilirubin Total: 0.3 mg/dL (ref 0.0–1.2)
Bilirubin, Direct: 0.11 mg/dL (ref 0.00–0.40)
CO2: 23 mmol/L (ref 20–29)
CREATININE: 0.87 mg/dL (ref 0.57–1.00)
Calcium: 9.2 mg/dL (ref 8.7–10.3)
Chloride: 103 mmol/L (ref 96–106)
GFR calc Af Amer: 73 mL/min/{1.73_m2} (ref >59)
GFR, EST NON AFRICAN AMERICAN: 64 mL/min/{1.73_m2} (ref >59)
Glucose: 102 mg/dL — ABNORMAL HIGH (ref 65–99)
POTASSIUM: 3.4 mmol/L — AB (ref 3.5–5.2)
Sodium: 140 mmol/L (ref 134–144)
TOTAL PROTEIN: 6.6 g/dL (ref 6.0–8.5)

## 2018-02-19 ENCOUNTER — Other Ambulatory Visit: Payer: Self-pay | Admitting: Physician Assistant

## 2018-02-19 MED ORDER — POTASSIUM CHLORIDE ER 10 MEQ PO TBCR: 10.0000 meq | EXTENDED_RELEASE_TABLET | Freq: Every day | ORAL | 1 refills | Status: DC

## 2018-02-21 ENCOUNTER — Telehealth: Payer: Self-pay | Admitting: Physician Assistant

## 2018-02-21 NOTE — Telephone Encounter (Signed)
Pt given results per notes of Philis Fendt PA on 02/19/18. Unable to document in result note due to result note not being routed to [facility]. Pt given results via sign language interpreter [id number]. Pt asked potassium to be called in to [facility].

## 2018-02-22 ENCOUNTER — Other Ambulatory Visit: Payer: Self-pay

## 2018-02-22 ENCOUNTER — Encounter: Payer: Self-pay | Admitting: Physician Assistant

## 2018-02-22 ENCOUNTER — Ambulatory Visit (INDEPENDENT_AMBULATORY_CARE_PROVIDER_SITE_OTHER): Payer: Medicare Other | Admitting: Physician Assistant

## 2018-02-22 VITALS — BP 124/84 | HR 82 | Temp 98.8°F | Resp 16 | Ht 64.0 in | Wt 184.2 lb

## 2018-02-22 DIAGNOSIS — I878 Other specified disorders of veins: Secondary | ICD-10-CM | POA: Diagnosis not present

## 2018-02-22 DIAGNOSIS — R0989 Other specified symptoms and signs involving the circulatory and respiratory systems: Secondary | ICD-10-CM | POA: Diagnosis not present

## 2018-02-22 DIAGNOSIS — Z9119 Patient's noncompliance with other medical treatment and regimen: Secondary | ICD-10-CM

## 2018-02-22 DIAGNOSIS — M7989 Other specified soft tissue disorders: Secondary | ICD-10-CM

## 2018-02-22 DIAGNOSIS — R600 Localized edema: Secondary | ICD-10-CM | POA: Diagnosis not present

## 2018-02-22 NOTE — Telephone Encounter (Signed)
Message from pt/sign language interp. Potassium called in to [facility]

## 2018-02-22 NOTE — Patient Instructions (Addendum)
On another note I will be leaving the practice in the end of August 2019 and will be moving back to the [place] area.  It was a pleasure to get to know you and serve you medically.  Please continue to take your medications.  I am happy to help you transition to another provider in the practice. I would recommend you see Dr. Nolon Rod or Dr. Pamella Pert.     IF you received an x-ray today, you will receive an invoice from [facility]. Please contact [facility] at [phone] with questions or concerns regarding your invoice.   IF you received labwork today, you will receive an invoice from [facility]. Please contact [facility] at [phone] with questions or concerns regarding your invoice.   Our billing staff will not be able to assist you with questions regarding bills from these companies.  You will be contacted with the lab results as soon as they are available. The fastest way to get your results is to activate your My Chart account. Instructions are located on the last page of this paperwork. If you have not heard from [place] regarding the results in 2 weeks, please contact this office.

## 2018-02-22 NOTE — Progress Notes (Signed)
02/22/2018 5:45 PM   DOB: Nov 28, 1935 / MRN: [id number]  SUBJECTIVE:  Melissa Murphy is a 82 y.o. female presenting for recheck leg swelling and HTN.  Left leg no better. She has taken doxy. The swelling improves with elevation.  She refuses to wear compression hose.   She is allergic to avastin [bevacizumab]; clindamycin/lincomycin; novocain [procaine]; and sulfa antibiotics.   She  has a past medical history of Anemia, Arthritis, Cataract, Clotting disorder ([place]), Deaf, DVT (deep venous thrombosis) ([place]), Edema, GERD (gastroesophageal reflux disease), Glaucoma, Hernia, Hiatal hernia, Hyperlipidemia, Hypertension, Osteoporosis, Sleep apnea, Thyroid disease, Ulcer, Urinary incontinence, and Uterine polyp.    She  reports that she has never smoked. She has never used smokeless tobacco. She reports that she does not drink alcohol or use drugs. She  reports that she does not currently engage in sexual activity. The patient  has a past surgical history that includes Cataract extraction; goiter removed; xanthoma tumor removed; Refractive surgery; Dilatation & currettage/hysteroscopy with resectoscope (N/A, 10/11/2012); Eye surgery; Hernia repair; Tonsillectomy; and Hiatal hernia repair (N/A, 12/08/2013).  Her family history includes Cancer (age of onset: 15) in her mother; Heart disease in her brother, mother, and paternal grandmother; Heart disease (age of onset: 30) in her father; Hernia in her brother; Hyperlipidemia in her brother; Hypertension in her brother and mother; Migraines in her daughter; Stroke (age of onset: 48) in her mother.  Review of Systems  Constitutional: Negative for chills, diaphoresis and fever.  Eyes: Negative.   Respiratory: Negative for cough, hemoptysis, sputum production, shortness of breath and wheezing.   Cardiovascular: Positive for leg swelling. Negative for chest pain and orthopnea.  Gastrointestinal: Negative for abdominal pain, blood in stool, constipation,  diarrhea, heartburn, melena, nausea and vomiting.  Genitourinary: Negative for flank pain.  Skin: Negative for rash.  Neurological: Negative for dizziness, sensory change, speech change, focal weakness and headaches.    The problem list and medications were reviewed and updated by myself where necessary and exist elsewhere in the encounter.   OBJECTIVE:  BP 124/84   Pulse 82   Temp 98.8 F (37.1 C)   Resp 16   Ht 5\' 4"  (1.626 m)   Wt 184 lb 3.2 oz (83.6 kg)   SpO2 97%   BMI 31.62 kg/m   Wt Readings from Last 3 Encounters:  02/22/18 184 lb 3.2 oz (83.6 kg)  02/15/18 185 lb 6.4 oz (84.1 kg)  02/06/18 193 lb (87.5 kg)   Temp Readings from Last 3 Encounters:  02/22/18 98.8 F (37.1 C)  02/15/18 98 F (36.7 C) (Oral)  02/08/18 99.7 F (37.6 C) (Oral)   BP Readings from Last 3 Encounters:  02/22/18 124/84  02/15/18 103/62  02/08/18 (!) 151/61   Pulse Readings from Last 3 Encounters:  02/22/18 82  02/15/18 76  02/08/18 (!) 52    Physical Exam  Constitutional: She is oriented to person, place, and time. She appears well-nourished. No distress.  Eyes: Pupils are equal, round, and reactive to light. EOM are normal.  Cardiovascular: Normal rate, regular rhythm, S1 normal, S2 normal, normal heart sounds and intact distal pulses. Exam reveals no gallop, no friction rub and no decreased pulses.  No murmur heard. Pulmonary/Chest: Effort normal. No stridor. No respiratory distress. She has no wheezes. She has no rales.  Abdominal: She exhibits no distension.  Musculoskeletal: She exhibits edema (left sided, chronic, with venous stasis rash).  Neurological: She is alert and oriented to person, place, and time. No cranial  nerve deficit. Gait normal.  Skin: Skin is dry. She is not diaphoretic.  Psychiatric: She has a normal mood and affect.  Vitals reviewed.   Lab Results  Component Value Date   HGBA1C 5.4 11/12/2017    Lab Results  Component Value Date   WBC 4.4  02/07/2018   HGB 13.6 02/07/2018   HCT 40.0 02/07/2018   MCV 92.3 02/07/2018   PLT 170 02/07/2018    Lab Results  Component Value Date   CREATININE 0.87 02/15/2018   BUN 10 02/15/2018   NA 140 02/15/2018   K 3.4 (L) 02/15/2018   CL 103 02/15/2018   CO2 23 02/15/2018    Lab Results  Component Value Date   ALT 10 02/15/2018   AST 15 02/15/2018   ALKPHOS 64 02/15/2018   BILITOT 0.3 02/15/2018    Lab Results  Component Value Date   TSH 1.77 04/28/2016    Lab Results  Component Value Date   CHOL 143 11/12/2017   HDL 51 11/12/2017   LDLCALC 59 11/12/2017   TRIG 165 (H) 11/12/2017   CHOLHDL 2.8 11/12/2017     ASSESSMENT AND PLAN:  [name] was seen today for hypertension.  Diagnoses and all orders for this visit:  Labile hypertension: Controlled today.   Leg swelling: Improves with elevation.  Comments: Chronic.  Not wearing compression stockings.  Orders: -     Ambulatory referral to Vascular Surgery    The patient is advised to call or return to clinic if she does not see an improvement in symptoms, or to seek the care of the closest emergency department if she worsens with the above plan.   Philis Fendt, MHS, PA-C [facility] 02/22/2018 5:45 PM

## 2018-02-27 ENCOUNTER — Telehealth: Payer: Self-pay | Admitting: Physician Assistant

## 2018-02-27 NOTE — Telephone Encounter (Signed)
Copied from [place] [id number]. Topic: Quick Communication - Lab Results >> Feb 20, 2018 10:02 AM Marin Roberts, [place] wrote: Called patient to inform them of 02/15/18 lab results. When patient returns call, triage nurse may disclose results.  >> Feb 27, 2018  4:48 PM Marin Olp L wrote: No P[facility] RN free for results. Ok to leave vmail and may get a relay interpreter. Needed blood sugar results in particular.

## 2018-02-27 NOTE — Telephone Encounter (Signed)
Pt given results and recommendation per notes of Philis Fendt, [place] on 02/19/18. Deaf interpreter used to relay results to pt.Pt verbalized understanding.Unable to document in result note due to result note not being routed to [facility].  Pt also wanted to make Legrand Como aware that she did by the compression socks with the zipper and it has helped with her leg pain. Pt also asking if Legrand Como would write a letter to the [place], pt did not specify name or location, so that she and her husband can get a mailbox on the door due to difficulties walking. Pt states she can be mailed the letter or she can come and pick the letter up.

## 2018-02-28 ENCOUNTER — Telehealth: Payer: Self-pay | Admitting: Physician Assistant

## 2018-02-28 NOTE — Telephone Encounter (Signed)
Letter printed, ready at front desk for pick up Called pt - interpreter placed call for pt

## 2018-02-28 NOTE — Telephone Encounter (Signed)
Yes.  Thank you so much Rose. Philis Fendt, MS, PA-C 10:25 AM, 02/28/2018

## 2018-02-28 NOTE — Telephone Encounter (Signed)
Copied from [place] [id number]. Topic: General - Other >> Feb 27, 2018  4:50 PM Marin Olp L wrote: Reason for CRM: Patient needs a letter stating that she can no longer walk to the mailbox and she needs a letter stating that the mailman needs to come to the door.

## 2018-02-28 NOTE — Telephone Encounter (Signed)
Legrand Como, do you want me to compose and have you sign? Just need approval of the walking difficulties.

## 2018-02-28 NOTE — Telephone Encounter (Signed)
Message 3 of 3 sent to Legrand Como

## 2018-02-28 NOTE — Telephone Encounter (Signed)
I don't know how to compose such a letter. She could get a letter and I can sign if she'd like.

## 2018-02-28 NOTE — Telephone Encounter (Signed)
Message 2 of 3 sent to Legrand Como

## 2018-03-05 ENCOUNTER — Other Ambulatory Visit: Payer: Self-pay

## 2018-03-05 DIAGNOSIS — M7989 Other specified soft tissue disorders: Secondary | ICD-10-CM

## 2018-03-14 ENCOUNTER — Other Ambulatory Visit: Payer: Self-pay | Admitting: Internal Medicine

## 2018-03-14 DIAGNOSIS — G4700 Insomnia, unspecified: Principal | ICD-10-CM

## 2018-03-15 NOTE — Telephone Encounter (Signed)
[facility] request  Last fill date 02/15/2018.    Craig Ionescu [name], LVN

## 2018-03-15 NOTE — Telephone Encounter (Signed)
LVM to call clinic back    Home # is disconnected

## 2018-03-15 NOTE — Telephone Encounter (Signed)
Please call patient to select new PCP and schedule establish care visit

## 2018-03-20 DIAGNOSIS — R350 Frequency of micturition: Secondary | ICD-10-CM | POA: Diagnosis not present

## 2018-03-20 DIAGNOSIS — N3946 Mixed incontinence: Secondary | ICD-10-CM | POA: Diagnosis not present

## 2018-04-09 NOTE — Telephone Encounter (Signed)
LVM to call clinic back    Home # is disconnected

## 2018-04-22 ENCOUNTER — Telehealth: Payer: Self-pay | Admitting: Internal Medicine

## 2018-04-22 DIAGNOSIS — G4700 Insomnia, unspecified: Principal | ICD-10-CM

## 2018-04-23 ENCOUNTER — Telehealth: Payer: Self-pay | Admitting: Family Medicine

## 2018-04-23 NOTE — Telephone Encounter (Signed)
Copied from [place] [id number]. Topic: General - Other >> Apr 23, 2018  4:59 PM Yvette Rack wrote: Reason for CRM: Pt called through translator requesting a refill on a Rx for Zoldeim tartare 10 mg which is not showing on list of medications. [id number] [address] [place] (Phone [phone] (Fax)

## 2018-04-23 NOTE — Telephone Encounter (Signed)
LVM  On [phone] to call clinic back    [phone] is disconnected

## 2018-04-26 NOTE — Telephone Encounter (Signed)
LVM to call back and select a new PCP.    Laurene FootmanLuis Va [facility]  [facility]  [phone]

## 2018-04-29 NOTE — Telephone Encounter (Signed)
Sign Language Interpreter [phone] tried contacting pt - pt unavailable but will leave sign language message for pt to call office to schedule ov (choose new pcp) for zoldeim 10 mg rx. Dgaddy, CMA

## 2018-05-12 ENCOUNTER — Other Ambulatory Visit: Payer: Self-pay | Admitting: Internal Medicine

## 2018-05-12 DIAGNOSIS — E7800 Pure hypercholesterolemia, unspecified: Principal | ICD-10-CM

## 2018-05-13 ENCOUNTER — Encounter (HOSPITAL_COMMUNITY): Payer: Medicare Other

## 2018-05-13 ENCOUNTER — Encounter: Payer: Medicare Other | Admitting: Surgery

## 2018-05-13 ENCOUNTER — Telehealth: Payer: Self-pay | Admitting: Family Medicine

## 2018-05-13 NOTE — Telephone Encounter (Signed)
Copied from [place] [id number]. Topic: General - Other >> May 13, 2018 11:06 AM Jodie Echevaria wrote: Reason for CRM: Patient called to say that she was referred to a Vascular specialist but its too far from her home and she would like this appointment rescheduled to somewhere closer to her home. Preferably on [address] Patient states that Dr Delia Chimes is her new PCP. Ph# [phone] ok to leave voice message if no answer.

## 2018-05-13 NOTE — Telephone Encounter (Signed)
Called patient daughter and lvm to see if there was a certain provider she would like for [place] to send her to.

## 2018-05-13 NOTE — Telephone Encounter (Signed)
Please see note below. 

## 2018-05-13 NOTE — Telephone Encounter (Signed)
Let her know that those specialists are hard to get in with. I will let the referral team see if something is closer if there isn't a group then she should keep the appointment

## 2018-05-13 NOTE — Telephone Encounter (Signed)
Letter written and mailed.  Ritvik Mczeal, MA

## 2018-05-13 NOTE — Telephone Encounter (Signed)
Please mail patient a letter letting her know she needs to call the clinic to select a new PCP

## 2018-05-13 NOTE — Telephone Encounter (Signed)
[facility] request. Last fill date 02/15/2018    Jakaila Norment, MA

## 2018-05-14 ENCOUNTER — Encounter: Payer: Self-pay | Admitting: Surgery

## 2018-05-15 ENCOUNTER — Ambulatory Visit: Payer: Medicare Other | Admitting: Physician Assistant

## 2018-05-16 ENCOUNTER — Ambulatory Visit (INDEPENDENT_AMBULATORY_CARE_PROVIDER_SITE_OTHER): Payer: Medicare Other | Admitting: Physician Assistant

## 2018-05-16 ENCOUNTER — Encounter: Payer: Self-pay | Admitting: Physician Assistant

## 2018-05-16 VITALS — BP 116/76 | HR 58 | Temp 98.4°F | Resp 16 | Ht 63.0 in | Wt 186.6 lb

## 2018-05-16 DIAGNOSIS — Z23 Encounter for immunization: Secondary | ICD-10-CM

## 2018-05-16 DIAGNOSIS — K5900 Constipation, unspecified: Secondary | ICD-10-CM | POA: Diagnosis not present

## 2018-05-16 MED ORDER — DOCUSATE SODIUM 50 MG PO CAPS: 50.0000 mg | ORAL_CAPSULE | Freq: Three times a day (TID) | ORAL | 0 refills | Status: DC | PRN

## 2018-05-16 NOTE — Progress Notes (Signed)
Melissa Murphy  MRN: [id number] DOB: [birth year]  PCP: Dorise Hiss, PA-C  Subjective:  Pt is a 82 year old female who presents to clinic to establish care. She is a former pt of Melissa Murphy. Speaks ASL, interpreter is here with her today.  She moved here from [place] about 2 years ago. Her children live here in the area.  Her husband is from [place] and went deaf at age 39.  She is feeling well today.   Dislipidemia - Zocor 40mg   HTN - atenolol-chlorthalidone 50-25 mg. Blood pressure controlled at 116/76. Denies lightheadedness, dizziness, chronic headache, double vision, chest pain, shortness of breath, heart racing, palpitations, nausea, vomiting, abdominal pain, hematuria, lower leg swelling. GERD - zantac 75mg    Endorses constipations. Has not taken anything recently for this. BM are described as "little balls". Has to strain to have a BM.  She has colonoscopy in a few months.   Pt  has a past medical history of Anemia, Arthritis, Cataract, Clotting disorder ([place]), Deaf, DVT (deep venous thrombosis) ([place]), Edema, GERD (gastroesophageal reflux disease), Glaucoma, Hernia, Hiatal hernia, Hyperlipidemia, Hypertension, Osteoporosis, Sleep apnea, Thyroid disease, Ulcer, Urinary incontinence, and Uterine polyp.  Review of Systems  Constitutional: Negative for appetite change, chills, fever and unexpected weight change.  Gastrointestinal: Positive for abdominal pain and constipation. Negative for diarrhea, nausea and vomiting.  Neurological: Negative for dizziness, light-headedness and headaches.    Patient Active Problem List   Diagnosis Date Noted  . Soft tissue injury of left lower leg 02/07/2018  . Deafness 04/03/2014  . Insomnia 04/01/2014  . Hiatal hernia 12/08/2013  . Obstructive sleep apnea 01/10/2013  . Uterine polyp   . Unspecified chronic bronchitis ([place]) 10/02/2012  . 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 Hypertensive urgency 04/30/2012  . Edema  09/18/2011  . Anemia 09/18/2011    Current Outpatient Medications on File Prior to Visit  Medication Sig Dispense Refill  . aspirin 81 MG chewable tablet Chew 81 mg by mouth daily.    . cloNIDine (CATAPRES) 0.1 MG tablet Take 1 tablet (0.1 mg total) by mouth 2 (two) times daily. 60 tablet 0  . dorzolamide-timolol (COSOPT) 22.3-6.8 MG/ML ophthalmic solution Place 1 drop into the right eye 2 (two) times daily.    . furosemide (LASIX) 20 MG tablet Take 1 tablet (20 mg total) by mouth daily. 30 tablet 0  . lidocaine (LIDODERM) 5 % Place 1 patch onto the skin daily. Remove & Discard patch within 12 hours or as directed by MD 30 patch 0  . potassium chloride (K-DUR) 10 MEQ tablet Take 1 tablet (10 mEq total) by mouth daily. 90 tablet 1  . ramipril (ALTACE) 10 MG capsule Take 1 capsule (10 mg total) by mouth 2 (two) times daily. 180 capsule 3  . simvastatin (ZOCOR) 40 MG tablet Take 0.5 tablets (20 mg total) by mouth at bedtime. 90 tablet 3  . tolterodine (DETROL LA) 4 MG 24 hr capsule Take 4 mg by mouth daily.     No current facility-administered medications on file prior to visit.     Allergies  Allergen Reactions  . Avastin [Bevacizumab] Nausea And Vomiting  . Clindamycin/Lincomycin Itching  . Novocain [Procaine] Other (See Comments)    seizures  . Sulfa Antibiotics Other (See Comments)    fever     Objective:  BP 116/76 (BP Location: Left Arm, Patient Position: Sitting, Cuff Size: Large)   Pulse (!) 58   Temp 98.4 F (36.9 C) (Oral)   Resp  16   Ht 5\' 3"  (1.6 m)   Wt 186 lb 9.6 oz (84.6 kg)   SpO2 97%   BMI 33.05 kg/m   Physical Exam  Constitutional: She is oriented to person, place, and time. No distress.  Cardiovascular: Normal rate, regular rhythm and normal heart sounds.  Neurological: She is alert and oriented to person, place, and time.  Skin: Skin is warm and dry.  Psychiatric: Judgment normal.  Vitals reviewed.   Assessment and Plan :  1. Constipation,  unspecified constipation type - Pt endorses abdominal pain and constipation. Plan to treat with Colace. Constipation information provided. RTC if no improvement.  - docusate sodium (COLACE) 50 MG capsule; Take 1 capsule (50 mg total) by mouth 3 (three) times daily as needed for mild constipation.  Dispense: 10 capsule; Refill: 0  2. Flu vaccine need - Administered by CMA.     Mercer Pod, PA-C  [facility] 05/16/2018 3:56 PM  Please note: Portions of this report may have been transcribed using dragon voice recognition software. Every effort was made to ensure accuracy; however, inadvertent computerized transcription errors may be present.

## 2018-05-16 NOTE — Patient Instructions (Addendum)
[facility] [address], [place], [place]  [phone]   For constipation  If your stools are hard or are formed balls or you have to strain a stool softener will help - use colace 2-3 capsule a day  Look up "6 Reasons to Care about [place]" at [place] ([email or web address])    1) Water: Make sure you are drinking enough water daily - about 1-3 liters. 2) Fiber: Make sure you are getting enough fiber in your diet - this will make you regular - you can eat high fiber foods or use metamucil as a supplement - it is really important to drink enough water when using fiber supplements. Foods that have a lot of fiber include vegetables, fruits, beans, nuts, oatmeal, and some breads and cereals. You can tell how much fiber is in a food by reading the nutrition label. Doctors recommend eating 25 to 36 grams of fiber each day. 3) Fitness: Increasing your physical activity will help increase the natural movement of your bowels. Try to get 20-30 minutes of exercise daily.  4) Use a 9" stool in front of your toilet to rest your feet on while you have a bowel movement. This will loosen your rectal muscles to help your stool come out easier and prevent straining.   ?Refrain from straining or lingering (eg, reading) on the toilet.  ?If possible, patients should avoid medications that can cause constipation or diarrhea. ?Limit intake of fatty foods and alcohol, which can exacerbate constipation.   For gentle treatment of constipation: Use Miralax 1-2 capfuls a day until your stools are soft and regular and then decrease the usage - you can use this daily     If you have lab work done today you will be contacted with your lab results within the next 2 weeks.  If you have not heard from [place] then please contact us. The fastest way to get your results is to register for My Chart.   IF you received an x-ray today, you will receive an  invoice from [facility]. Please contact [facility] at [phone] with questions or concerns regarding your invoice.   IF you received labwork today, you will receive an invoice from [facility]. Please contact [facility] at [phone] with questions or concerns regarding your invoice.   Our billing staff will not be able to assist you with questions regarding bills from these companies.  You will be contacted with the lab results as soon as they are available. The fastest way to get your results is to activate your My Chart account. Instructions are located on the last page of this paperwork. If you have not heard from [place] regarding the results in 2 weeks, please contact this office.    Influenza (Flu) Vaccine (Inactivated or Recombinant): What You Need to Know 1. Why get vaccinated? Influenza ("flu") is a contagious disease that spreads around the [place] every year, usually between October and May. Flu is caused by influenza viruses, and is spread mainly by coughing, sneezing, and close contact. Anyone can get flu. Flu strikes suddenly and can last several days. Symptoms vary by age, but can include:  fever/chills  sore throat  muscle aches  fatigue  cough  headache  runny or stuffy nose  Flu can also lead to pneumonia and blood infections, and cause diarrhea and seizures in children. If you have a medical condition, such as heart or lung disease, flu can make it worse. Flu is more dangerous  for some people. Infants and young children, people 13 years of age and older, pregnant women, and people with certain health conditions or a weakened immune system are at greatest risk. Each year thousands of people in the [place] die from flu, and many more are hospitalized. Flu vaccine can:  keep you from getting flu,  make flu less severe if you do get it, and  keep you from spreading flu to your family and other people. 2. Inactivated and recombinant flu  vaccines A dose of flu vaccine is recommended every flu season. Children 6 months through 56 years of age may need two doses during the same flu season. Everyone else needs only one dose each flu season. Some inactivated flu vaccines contain a very small amount of a mercury-based preservative called thimerosal. Studies have not shown thimerosal in vaccines to be harmful, but flu vaccines that do not contain thimerosal are available. There is no live flu virus in flu shots. They cannot cause the flu. There are many flu viruses, and they are always changing. Each year a new flu vaccine is made to protect against three or four viruses that are likely to cause disease in the upcoming flu season. But even when the vaccine doesn't exactly match these viruses, it may still provide some protection. Flu vaccine cannot prevent:  flu that is caused by a virus not covered by the vaccine, or  illnesses that look like flu but are not.  It takes about 2 weeks for protection to develop after vaccination, and protection lasts through the flu season. 3. Some people should not get this vaccine Tell the person who is giving you the vaccine:  If you have any severe, life-threatening allergies. If you ever had a life-threatening allergic reaction after a dose of flu vaccine, or have a severe allergy to any part of this vaccine, you may be advised not to get vaccinated. Most, but not all, types of flu vaccine contain a small amount of egg protein.  If you ever had Guillain-Barr Syndrome (also called GBS). Some people with a history of GBS should not get this vaccine. This should be discussed with your doctor.  If you are not feeling well. It is usually okay to get flu vaccine when you have a mild illness, but you might be asked to come back when you feel better.  4. Risks of a vaccine reaction With any medicine, including vaccines, there is a chance of reactions. These are usually mild and go away on their own, but  serious reactions are also possible. Most people who get a flu shot do not have any problems with it. Minor problems following a flu shot include:  soreness, redness, or swelling where the shot was given  hoarseness  sore, red or itchy eyes  cough  fever  aches  headache  itching  fatigue  If these problems occur, they usually begin soon after the shot and last 1 or 2 days. More serious problems following a flu shot can include the following:  There may be a small increased risk of Guillain-Barre Syndrome (GBS) after inactivated flu vaccine. This risk has been estimated at 1 or 2 additional cases per million people vaccinated. This is much lower than the risk of severe complications from flu, which can be prevented by flu vaccine.  Young children who get the flu shot along with pneumococcal vaccine (PCV13) and/or DTaP vaccine at the same time might be slightly more likely to have a seizure caused  by fever. Ask your doctor for more information. Tell your doctor if a child who is getting flu vaccine has ever had a seizure.  Problems that could happen after any injected vaccine:  People sometimes faint after a medical procedure, including vaccination. Sitting or lying down for about 15 minutes can help prevent fainting, and injuries caused by a fall. Tell your doctor if you feel dizzy, or have vision changes or ringing in the ears.  Some people get severe pain in the shoulder and have difficulty moving the arm where a shot was given. This happens very rarely.  Any medication can cause a severe allergic reaction. Such reactions from a vaccine are very rare, estimated at about 1 in a million doses, and would happen within a few minutes to a few hours after the vaccination. As with any medicine, there is a very remote chance of a vaccine causing a serious injury or death. The safety of vaccines is always being monitored. For more information, visit: http://www.aguilar.org/ 5. What  if there is a serious reaction? What should I look for? Look for anything that concerns you, such as signs of a severe allergic reaction, very high fever, or unusual behavior. Signs of a severe allergic reaction can include hives, swelling of the face and throat, difficulty breathing, a fast heartbeat, dizziness, and weakness. These would start a few minutes to a few hours after the vaccination. What should I do?  If you think it is a severe allergic reaction or other emergency that can't wait, call 9-1-1 and get the person to the nearest hospital. Otherwise, call your doctor.  Reactions should be reported to the Vaccine Adverse Event Reporting System (VAERS). Your doctor should file this report, or you can do it yourself through the VAERS web site at www.vaers.SamedayNews.es, or by calling (918) 447-1461. ? VAERS does not give medical advice. 6. The National Vaccine Injury Compensation Program The [employer] Vaccine Injury Compensation Program (VICP) is a federal program that was created to compensate people who may have been injured by certain vaccines. Persons who believe they may have been injured by a vaccine can learn about the program and about filing a claim by calling (574) 853-1955 or visiting the [place] website at GoldCloset.com.ee. There is a time limit to file a claim for compensation. 7. How can I learn more?  Ask your healthcare provider. He or she can give you the vaccine package insert or suggest other sources of information.  Call your local or state health department.  Contact the Centers for Disease Control and Prevention (CDC): ? Call 727 765 7530 (1-800-CDC-INFO) or ? Visit CDC's website at https://gibson.com/ Vaccine Information Statement, Inactivated Influenza Vaccine (03/20/2014) This information is not intended to replace advice given to you by your health care provider. Make sure you discuss any questions you have with your health care provider. Document Released:  05/25/2006 Document Revised: 04/20/2016 Document Reviewed: 04/20/2016 Elsevier Interactive Patient Education  2017 [employer].

## 2018-05-22 DIAGNOSIS — Z961 Presence of intraocular lens: Secondary | ICD-10-CM | POA: Diagnosis not present

## 2018-05-22 DIAGNOSIS — H40053 Ocular hypertension, bilateral: Secondary | ICD-10-CM | POA: Diagnosis not present

## 2018-05-22 DIAGNOSIS — H31091 Other chorioretinal scars, right eye: Secondary | ICD-10-CM | POA: Diagnosis not present

## 2018-05-22 DIAGNOSIS — H348312 Tributary (branch) retinal vein occlusion, right eye, stable: Secondary | ICD-10-CM | POA: Diagnosis not present

## 2018-05-24 ENCOUNTER — Other Ambulatory Visit: Payer: Self-pay | Admitting: INTERNAL MEDICINE

## 2018-05-24 DIAGNOSIS — G4700 Insomnia, unspecified: Principal | ICD-10-CM

## 2018-05-24 NOTE — Telephone Encounter (Signed)
Please disregard the request below. I left a VM to have patient call back and pick new PCP.     Hollie Salk, MA

## 2018-05-24 NOTE — Telephone Encounter (Signed)
Requested Prescriptions     Pending Prescriptions Disp Refills    Zolpidem (AMBIEN) 10 mg Tablet [Pharmacy Med Name: ZOLPIDEM TARTRATE 10 MG TABLET] 30 tablet 0     Sig: Take 1 tablet by mouth every day at bedtime if needed for insomnia.       [facility] Pharm[facility] request   Last fill date: 04/23/18    Kandace Blitz, [place]

## 2018-05-27 ENCOUNTER — Ambulatory Visit: Payer: Medicare Other | Admitting: Family Medicine

## 2018-05-30 ENCOUNTER — Telehealth: Payer: Self-pay | Admitting: Physician Assistant

## 2018-05-30 DIAGNOSIS — G4700 Insomnia, unspecified: Secondary | ICD-10-CM

## 2018-05-30 NOTE — Telephone Encounter (Signed)
Copied from [place] [id number]. Topic: Quick Communication - Rx Refill/Question >> May 30, 2018  2:53 PM Oliver Pila B wrote: Medication: zolpidem (AMBIEN) 10 MG tablet [[id number]]  DISCONTINUED   Has the patient contacted their pharmacy? Yes.   (Agent: If no, request that the patient contact the pharmacy for the refill.) (Agent: If yes, when and what did the pharmacy advise?)  Preferred Pharmacy (with phone number or street name): [facility] [address]   Agent: Please be advised that RX refills may take up to 3 business days. We ask that you follow-up with your pharmacy.

## 2018-05-30 NOTE — Telephone Encounter (Signed)
Requested medication (s) are due for refill today: no  Requested medication (s) are on the active medication list: yes    Last refill: 05/16/18  Future visit scheduled yes 08/19/2018  Notes to clinic:By historical provider  Requested Prescriptions  Pending Prescriptions Disp Refills   zolpidem (AMBIEN) 10 MG tablet 30 tablet     Sig: Take 1 tablet (10 mg total) by mouth at bedtime as needed for sleep.     Not Delegated - Psychiatry:  Anxiolytics/Hypnotics Failed - 05/30/2018  3:05 PM      Failed - This refill cannot be delegated      Failed - Urine Drug Screen completed in last 360 days.      Passed - Valid encounter within last 6 months    Recent Outpatient Visits          2 weeks ago Constipation, unspecified constipation type   [facility] at [facility], Gelene Mink, PA-C   3 months ago Labile hypertension   [facility] at [facility], PA-C   3 months ago Labile hypertension   [facility] at [facility], PA-C   5 months ago Acute pain of right knee   [facility] at [facility], PA-C   6 months ago Labile hypertension   [facility] at [facility], PA-C      Future Appointments            In 2 months Carlota Raspberry Ranell Patrick, MD Primary [facility] at [facility], [facility]

## 2018-05-30 NOTE — Telephone Encounter (Signed)
Copied from [place] [id number]. Topic: Quick Communication - Rx Refill/Question >> May 30, 2018  2:53 PM Oliver Pila B wrote: Medication: zolpidem (AMBIEN) 10 MG tablet [[id number]]  DISCONTINUED   Has the patient contacted their pharmacy? Yes.   (Agent: If no, request that the patient contact the pharmacy for the refill.) (Agent: If yes, when and what did the pharmacy advise?)  Preferred Pharmacy (with phone number [facility] [address]   Agent: Please be advised that RX refills may take up to 3 business days. We ask that you follow-up with your pharmacy.

## 2018-06-01 ENCOUNTER — Other Ambulatory Visit: Payer: Self-pay | Admitting: INTERNAL MEDICINE

## 2018-06-01 DIAGNOSIS — G4700 Insomnia, unspecified: Principal | ICD-10-CM

## 2018-06-03 ENCOUNTER — Telehealth: Payer: Self-pay | Admitting: Physician Assistant

## 2018-06-03 MED ORDER — ZOLPIDEM TARTRATE 10 MG PO TABS: 5.0000 mg | ORAL_TABLET | Freq: Every evening | ORAL | 0 refills | Status: DC | PRN

## 2018-06-03 NOTE — Telephone Encounter (Signed)
Reviewed chart, PMP with Dr. Pamella Pert. I will Rx Ambien until she can be seen by Dr. Pamella Pert on 06/29/18. Will need OV at that time to discuss Ambien/insomnia as reason for OV.

## 2018-06-03 NOTE — Telephone Encounter (Signed)
Pt is currently in office requesting a refill for her Ambien..  She is completely out and is needing it for sleep.  She states Clark had given her a refill before he left.  Was prescribed by [facility] in 2016/2017.  Pt has been back and forth between here and [place], hence why there has been gaps between refills.  She primarily lives here now.  Please advise

## 2018-06-03 NOTE — Telephone Encounter (Signed)
Patient came in today to drop off handicap placard forms for [facility] to fill out.  I have made a copy of the form and the patient has the original.  I have placed the form in [place]'s box to complete.  She will be back tomorrow to pick up. Please advise

## 2018-06-04 NOTE — Telephone Encounter (Signed)
Spoke to [place] about the handicap placard form and checked in her box, the patient will come today to pick up the form. The form was not in her box and I gave Whitney one of the office form to complete.

## 2018-06-06 NOTE — Telephone Encounter (Signed)
pls confirm she is still a [facility] pt. If so, needs to make an appointment to establish with a new provider before this can be refilled.

## 2018-06-06 NOTE — Telephone Encounter (Signed)
LVM for pt to call the clinic

## 2018-06-11 ENCOUNTER — Other Ambulatory Visit: Payer: Self-pay | Admitting: Urgent Care

## 2018-06-11 ENCOUNTER — Encounter: Payer: Self-pay | Admitting: Family Medicine

## 2018-06-11 ENCOUNTER — Other Ambulatory Visit: Payer: Self-pay

## 2018-06-11 ENCOUNTER — Ambulatory Visit (INDEPENDENT_AMBULATORY_CARE_PROVIDER_SITE_OTHER): Payer: Medicare Other | Admitting: Family Medicine

## 2018-06-11 VITALS — BP 144/90 | HR 62 | Temp 97.9°F | Ht 64.0 in | Wt 188.4 lb

## 2018-06-11 DIAGNOSIS — R609 Edema, unspecified: Secondary | ICD-10-CM

## 2018-06-11 DIAGNOSIS — R2689 Other abnormalities of gait and mobility: Secondary | ICD-10-CM | POA: Diagnosis not present

## 2018-06-11 NOTE — Patient Instructions (Addendum)
  I completed the paperwork, but please follow up as planned with Dr. Pamella Pert on November 16th as planned to discuss Ambien. I would also recommend discussing balance issues further at that time or may need to be discussed at other visit.  Can be discussed with D.r Pamella Pert.   Thank you for coming in today.   If you have lab work done today you will be contacted with your lab results within the next 2 weeks.  If you have not heard from [place] then please contact us. The fastest way to get your results is to register for My Chart.   IF you received an x-ray today, you will receive an invoice from [facility]. Please contact [facility] at [phone] with questions or concerns regarding your invoice.   IF you received labwork today, you will receive an invoice from [facility]. Please contact [facility] at [phone] with questions or concerns regarding your invoice.   Our billing staff will not be able to assist you with questions regarding bills from these companies.  You will be contacted with the lab results as soon as they are available. The fastest way to get your results is to activate your My Chart account. Instructions are located on the last page of this paperwork. If you have not heard from [place] regarding the results in 2 weeks, please contact this office.

## 2018-06-11 NOTE — Progress Notes (Signed)
Subjective:  By signing my name below, I, Melissa Murphy, attest that this documentation has been prepared under the direction and in the presence of Melissa Ray, MD. Electronically Signed: Moises Murphy, [place]. 06/11/2018 , 3:13 PM .  Patient was seen in Room 8 .   Patient ID: Melissa Murphy, female    DOB: 09-04-1935, 82 y.o.   MRN: [id number] Chief Complaint  Patient presents with  . form to be completed    DMV handicap placard   HPI Melissa Murphy is a 82 y.o. female  Patient is here requesting DMV forms for handicap placard completed. She previously had handicap placard in [place], but expired, and had to pay $250 for a new one. She denies any prior accidents. She states she only drives locally and doesn't drive on the highway or the interstates. She has assistance devices at home due to hearing deficit.  Her only restrictions to drive is she has to wear her corrective glasses.   She uses a cane to walk due to poor balance for a while (reports possibly due to hearing and her age). She isn't able to walk far due to weak legs, but can walk around a store with a cart. She also has pedal edema due to HTN. She mentions history of urination issues, followed by her urologist.   Patient is deaf and uses Sign Language to communicate; an interpreter is present today to assist communication. She is sometimes able to read lips.   Patient Active Problem List   Diagnosis Date Noted  . Soft tissue injury of left lower leg 02/07/2018  . Deafness 04/03/2014  . Insomnia 04/01/2014  . Hiatal hernia 12/08/2013  . Obstructive sleep apnea 01/10/2013  . Uterine polyp   . Unspecified chronic bronchitis ([place]) 10/02/2012  . 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 Hypertensive urgency 04/30/2012  . Edema 09/18/2011  . Anemia 09/18/2011   Past Medical History:  Diagnosis Date  . Anemia   . Arthritis   . Cataract   . Clotting disorder ([place])   . Deaf    Congenital; Requires an  interpreter  . DVT (deep venous thrombosis) (HCC)    in eye, and leg  . Edema   . GERD (gastroesophageal reflux disease)   . Glaucoma   . Hernia   . Hiatal hernia   . Hyperlipidemia   . Hypertension   . Osteoporosis   . Sleep apnea   . Thyroid disease   . Ulcer   . Urinary incontinence   . Uterine polyp    Past Surgical History:  Procedure Laterality Date  . CATARACT EXTRACTION     both eyes  . DILATATION & CURRETTAGE/HYSTEROSCOPY WITH RESECTOCOPE N/A 10/11/2012   Procedure: DILATATION & CURETTAGE/HYSTEROSCOPY WITH RESECTOCOPE;  Surgeon: Allena Katz, MD;  Location: [facility];  Service: Gynecology;  Laterality: N/A;  pt is deaf; please contact daughter Heide Spark [phone]  . EYE SURGERY    . goiter removed     age 43  . HERNIA REPAIR    . HIATAL HERNIA REPAIR N/A 12/08/2013   Procedure: LAPAROSCOPIC REPAIR OF HIATAL HERNIA  ;  Surgeon: [name], MD;  Location: [facility];  Service: General;  Laterality: N/A;  . REFRACTIVE SURGERY    . TONSILLECTOMY    . xanthoma tumor removed     Allergies  Allergen Reactions  . Avastin [Bevacizumab] Nausea And Vomiting  . Clindamycin/Lincomycin Itching  . Novocain [Procaine] Other (See Comments)    seizures  .  Sulfa Antibiotics Other (See Comments)    fever   Prior to Admission medications   Medication Sig Start Date End Date Taking? Authorizing Provider  aspirin 81 MG chewable tablet Chew 81 mg by mouth daily.   Yes [provider]  cloNIDine (CATAPRES) 0.1 MG tablet Take 1 tablet (0.1 mg total) by mouth 2 (two) times daily. 02/08/18  Yes Eugenie Filler, MD  docusate sodium (COLACE) 50 MG capsule Take 1 capsule (50 mg total) by mouth 3 (three) times daily as needed for mild constipation. 05/16/18  Yes McVey, Gelene Mink, PA-C  furosemide (LASIX) 20 MG tablet Take 1 tablet (20 mg total) by mouth daily. 02/08/18  Yes Eugenie Filler, MD  potassium chloride (K-DUR) 10 MEQ tablet Take 1 tablet (10 mEq total) by  mouth daily. 02/19/18  Yes Tereasa Coop, PA-C  ramipril (ALTACE) 10 MG capsule Take 1 capsule (10 mg total) by mouth 2 (two) times daily. 10/15/17  Yes Tereasa Coop, PA-C  simvastatin (ZOCOR) 40 MG tablet Take 0.5 tablets (20 mg total) by mouth at bedtime. 11/12/17  Yes Tereasa Coop, PA-C  tolterodine (DETROL LA) 4 MG 24 hr capsule Take 4 mg by mouth daily.   Yes [provider]  zolpidem (AMBIEN) 10 MG tablet Take 0.5-1 tablets (5-10 mg total) by mouth at bedtime as needed for sleep. 06/03/18  Yes Wendie Agreste, MD  dorzolamide-timolol (COSOPT) 22.3-6.8 MG/ML ophthalmic solution Place 1 drop into the right eye 2 (two) times daily.    [provider]  lidocaine (LIDODERM) 5 % Place 1 patch onto the skin daily. Remove & Discard patch within 12 hours or as directed by MD Patient not taking: Reported on 05/16/2018 02/08/18   Eugenie Filler, MD   Social History   Socioeconomic History  . Marital status: Married    Spouse name: Not on file  . Number of children: 3  . Years of education: Grad   . Highest education level: Not on file  Occupational History  . 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 Financial resource strain: Not on file  . Food insecurity:    Worry: Not on file    Inability: Not on file  . Transportation needs:    Medical: Not on file    Non-medical: Not on file  Tobacco Use  . Smoking status: Never Smoker  . Smokeless tobacco: Never Used  Substance and Sexual Activity  . Alcohol use: No    Alcohol/week: 0.0 standard drinks    Comment: occasionally  . Drug use: No  . Sexual activity: Not Currently  Lifestyle  . Physical activity:    Days per week: Not on file    Minutes per session: Not on file  . Stress: Not on file  Relationships  . Social connections:    Talks on phone: Not on file    Gets together: Not on file    Attends religious service: Not on file    Active member of club or organization: Not on file    Attends  meetings of clubs or organizations: Not on file    Relationship status: Not on file  . Intimate partner violence:    Fear of current or ex partner: Not on file    Emotionally abused: Not on file    Physically abused: Not on file    Forced sexual activity: Not on file  Other Topics Concern  . Not on file  Social History Narrative  Marital status: married x 1999; second marriage; first husband died from AMI in 57;  husband is deaf as well.  Moderately happy; no abuse.      Children: 3 children; 2 grandchildren.  2 gg      Lives: with husband and daughter.  Moved from [place]; still has home in [place] but plans to remain in [place].      Employment: retired; Hospital doctor.      Tobacco:  Never      Alcohol: none      Exercising:  Walking and cleaning the house.      ADLs:  Uses cane for ambulation; has walker; no cooking; cleans house on own; pays own bills; goes to grocery store with husband. Performs self hygiene.        Advanced Directives:  None.  FULL CODE desired; no prolonged measures.   Review of Systems  Constitutional: Negative for fatigue and unexpected weight change.  Respiratory: Negative for chest tightness and shortness of breath.   Cardiovascular: Negative for chest pain, palpitations and leg swelling.  Gastrointestinal: Negative for abdominal pain and Murphy in stool.  Neurological: Negative for dizziness, syncope, light-headedness and headaches.       Objective:   Physical Exam  Constitutional: She is oriented to person, place, and time. She appears well-developed and well-nourished.  HENT:  Head: Normocephalic and atraumatic.  Eyes: Pupils are equal, round, and reactive to light. Conjunctivae and EOM are normal.  Neck: Carotid bruit is not present.  Cardiovascular: Normal rate, regular rhythm, normal heart sounds and intact distal pulses.  Pulmonary/Chest: Effort normal and breath sounds normal.  Abdominal: Soft. She exhibits no pulsatile midline mass. There  is no tenderness.  Musculoskeletal:  Able to extend and flex against resistance in her legs, 1-2+ pedal edema to the lower 3rd tibia bilaterally  Neurological: She is alert and oriented to person, place, and time.  Skin: Skin is warm and dry.  Psychiatric: She has a normal mood and affect. Her behavior is normal.  Vitals reviewed.   Vitals:   06/11/18 1453  BP: (!) 144/90  Pulse: 62  Temp: 97.9 F (36.6 C)  TempSrc: Oral  SpO2: 96%  Weight: 188 lb 6.4 oz (85.5 kg)  Height: 5\' 4"  (1.626 m)       Assessment & Plan:  Kambryn Dapolito is a 82 y.o. female Peripheral edema  Balance problem  Paperwork completed for handicap placard due to use of cane, difficulty walking long distances and balance issues.  No focal weakness appreciated on exam.  However, did recommend she follow-up with new primary provider to discuss these long-standing symptoms and determine if further work-up needed.  RTC precautions if any acute worsening.  Additionally discussed importance of follow-up in November to discuss Ambien, although reported long-term use.  Understanding expressed, with use of interpreter.  No orders of the defined types were placed in this encounter.  Patient Instructions    I completed the paperwork, but please follow up as planned with Dr. Pamella Pert on November 16th as planned to discuss Ambien. I would also recommend discussing balance issues further at that time or may need to be discussed at other visit.  Can be discussed with D.r Pamella Pert.   Thank you for coming in today.   If you have lab work done today you will be contacted with your lab results within the next 2 weeks.  If you have not heard from [place] then please contact us. The fastest way to get your  results is to register for My Chart.   IF you received an x-Murphy today, you will receive an invoice from [facility]. Please contact [facility] at [phone] with questions or concerns regarding your  invoice.   IF you received labwork today, you will receive an invoice from [facility]. Please contact [facility] at [phone] with questions or concerns regarding your invoice.   Our billing staff will not be able to assist you with questions regarding bills from these companies.  You will be contacted with the lab results as soon as they are available. The fastest way to get your results is to activate your My Chart account. Instructions are located on the last page of this paperwork. If you have not heard from [place] regarding the results in 2 weeks, please contact this office.       I personally performed the services described in this documentation, which was scribed in my presence. The recorded information has been reviewed and considered for accuracy and completeness, addended by me as needed, and agree with information above.  Signed,   Melissa Ray, MD [facility].  06/13/18 10:19 PM

## 2018-06-13 ENCOUNTER — Encounter: Payer: Self-pay | Admitting: Family Medicine

## 2018-06-17 ENCOUNTER — Emergency Department (HOSPITAL_COMMUNITY): Payer: Medicare Other

## 2018-06-17 ENCOUNTER — Emergency Department (HOSPITAL_COMMUNITY)
Admission: EM | Admit: 2018-06-17 | Discharge: 2018-06-17 | Disposition: A | Discharge status: Home / Self Care | Payer: Medicare Other | Attending: Emergency Medicine | Admitting: Emergency Medicine

## 2018-06-17 ENCOUNTER — Encounter (HOSPITAL_COMMUNITY): Payer: Self-pay

## 2018-06-17 DIAGNOSIS — I10 Essential (primary) hypertension: Secondary | ICD-10-CM | POA: Diagnosis not present

## 2018-06-17 DIAGNOSIS — Z79899 Other long term (current) drug therapy: Secondary | ICD-10-CM | POA: Insufficient documentation

## 2018-06-17 DIAGNOSIS — E785 Hyperlipidemia, unspecified: Secondary | ICD-10-CM | POA: Insufficient documentation

## 2018-06-17 DIAGNOSIS — Z7982 Long term (current) use of aspirin: Secondary | ICD-10-CM | POA: Insufficient documentation

## 2018-06-17 DIAGNOSIS — R040 Epistaxis: Secondary | ICD-10-CM

## 2018-06-17 DIAGNOSIS — R05 Cough: Secondary | ICD-10-CM | POA: Diagnosis not present

## 2018-06-17 LAB — BASIC METABOLIC PANEL
Anion gap: 8 (ref 5–15)
BUN: 31 mg/dL — ABNORMAL HIGH (ref 8–23)
CALCIUM: 9.4 mg/dL (ref 8.9–10.3)
CO2: 28 mmol/L (ref 22–32)
Chloride: 104 mmol/L (ref 98–111)
Creatinine, Ser: 0.62 mg/dL (ref 0.44–1.00)
GFR calc non Af Amer: >60 mL/min (ref >60)
Glucose, Bld: 111 mg/dL — ABNORMAL HIGH (ref 70–99)
Potassium: 4.1 mmol/L (ref 3.5–5.1)
Sodium: 140 mmol/L (ref 135–145)

## 2018-06-17 LAB — PROTIME-INR
INR: 0.92
Prothrombin Time: 12.2 seconds (ref 11.4–15.2)

## 2018-06-17 LAB — CBC WITH DIFFERENTIAL/PLATELET
Abs Immature Granulocytes: 0.02 10*3/uL (ref 0.00–0.07)
BASOS ABS: 0 10*3/uL (ref 0.0–0.1)
Basophils Relative: 0 %
EOS ABS: 0.1 10*3/uL (ref 0.0–0.5)
EOS PCT: 2 %
HEMATOCRIT: 38.9 % (ref 36.0–46.0)
Hemoglobin: 12.7 g/dL (ref 12.0–15.0)
Immature Granulocytes: 0 %
LYMPHS ABS: 2.8 10*3/uL (ref 0.7–4.0)
Lymphocytes Relative: 46 %
MCH: 31.3 pg (ref 26.0–34.0)
MCHC: 32.6 g/dL (ref 30.0–36.0)
MCV: 95.8 fL (ref 80.0–100.0)
Monocytes Absolute: 0.4 10*3/uL (ref 0.1–1.0)
Monocytes Relative: 7 %
NRBC: 0 % (ref 0.0–0.2)
Neutro Abs: 2.8 10*3/uL (ref 1.7–7.7)
Neutrophils Relative %: 45 %
Platelets: 175 10*3/uL (ref 150–400)
RBC: 4.06 MIL/uL (ref 3.87–5.11)
RDW: 13.1 % (ref 11.5–15.5)
WBC: 6.1 10*3/uL (ref 4.0–10.5)

## 2018-06-17 MED ORDER — RAMIPRIL 10 MG PO CAPS
10.0000 mg | ORAL_CAPSULE | Freq: Two times a day (BID) | ORAL | Status: DC
  Filled 2018-06-17: qty 1

## 2018-06-17 MED ORDER — CLONIDINE HCL 0.1 MG PO TABS
0.1000 mg | ORAL_TABLET | Freq: Two times a day (BID) | ORAL | Status: DC
  Administered 2018-06-17: 0.1 mg via ORAL
  Filled 2018-06-17: qty 1

## 2018-06-17 MED ORDER — OXYMETAZOLINE HCL 0.05 % NA SOLN: 1.0000 | Freq: Once | NASAL | Status: DC

## 2018-06-17 MED ORDER — OXYMETAZOLINE HCL 0.05 % NA SOLN
1.0000 | Freq: Once | NASAL | Status: AC
  Administered 2018-06-17: 1 via NASAL
  Filled 2018-06-17: qty 15

## 2018-06-17 NOTE — Discharge Instructions (Addendum)
Follow-up with your doctor regarding her blood pressure.  Keep a record of your blood pressure at the same time every day and follow-up with your doctor for medication adjustments.  If your nosebleed recurs, hold pressure for 30 minutes and come to the ED if bleeding persists beyond this.  Also return to the ED with chest pain, shortness of breath, dizziness or any other concerns.

## 2018-06-17 NOTE — ED Provider Notes (Signed)
[facility] DEPT Provider Note   CSN: [id number] Arrival date & time: 06/17/18  0253     History   Chief Complaint Chief Complaint  Patient presents with  . Epistaxis    HPI Melissa Murphy is a 82 y.o. female.  Level 5 caveat for deafness.  Sign language interpreter used.  Patient here with right-sided nosebleed that onset around 1 AM.  She reports she had significant bleeding from her right nare with clots.  She does not take any blood thinners.  She states she was coughing up several clots of blood which she thinks was coming from her nose.  She denies any chest pain or shortness of breath.  She denies any abdominal pain.  Did have some lightheadedness earlier which is since resolved.  Her blood pressure is elevated and she admits that it "goes up and down".  She states compliance with her blood pressure medication.  She wonders if her blood pressure is related to her nosebleed.  She denies any headache, chest pain, abdominal pain, back pain.  No shortness of breath. Left leg is swollen more than the right which is her usual baseline.  The history is provided by the patient. The history is limited by a language barrier. A language interpreter was used.  Epistaxis      Past Medical History:  Diagnosis Date  . Anemia   . Arthritis   . Cataract   . Clotting disorder ([facility])   . Deaf    Congenital; Requires an interpreter  . DVT (deep venous thrombosis) (HCC)    in eye, and leg  . Edema   . GERD (gastroesophageal reflux disease)   . Glaucoma   . Hernia   . Hiatal hernia   . Hyperlipidemia   . Hypertension   . Osteoporosis   . Sleep apnea   . Thyroid disease   . Ulcer   . Urinary incontinence   . Uterine polyp     Patient Active Problem List   Diagnosis Date Noted  . Soft tissue injury of left lower leg 02/07/2018  . Deafness 04/03/2014  . Insomnia 04/01/2014  . Hiatal hernia 12/08/2013  . Obstructive sleep apnea 01/10/2013  .  Uterine polyp   . Unspecified chronic bronchitis ([facility]) 10/02/2012  . 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 Hypertensive urgency 04/30/2012  . Edema 09/18/2011  . Anemia 09/18/2011    Past Surgical History:  Procedure Laterality Date  . CATARACT EXTRACTION     both eyes  . DILATATION & CURRETTAGE/HYSTEROSCOPY WITH RESECTOCOPE N/A 10/11/2012   Procedure: DILATATION & CURETTAGE/HYSTEROSCOPY WITH RESECTOCOPE;  Surgeon: Allena Katz, MD;  Location: [facility];  Service: Gynecology;  Laterality: N/A;  pt is deaf; please contact daughter Heide Spark [phone]  . EYE SURGERY    . goiter removed     age 89  . HERNIA REPAIR    . HIATAL HERNIA REPAIR N/A 12/08/2013   Procedure: LAPAROSCOPIC REPAIR OF HIATAL HERNIA  ;  Surgeon: [name], MD;  Location: [facility];  Service: General;  Laterality: N/A;  . REFRACTIVE SURGERY    . TONSILLECTOMY    . xanthoma tumor removed       OB History   None      Home Medications    Prior to Admission medications   Medication Sig Start Date End Date Taking? Authorizing Provider  aspirin 81 MG chewable tablet Chew 81 mg by mouth daily.    [provider]  cloNIDine (  CATAPRES) 0.1 MG tablet Take 1 tablet (0.1 mg total) by mouth 2 (two) times daily. 02/08/18   Eugenie Filler, MD  docusate sodium (COLACE) 50 MG capsule Take 1 capsule (50 mg total) by mouth 3 (three) times daily as needed for mild constipation. 05/16/18   McVey, Gelene Mink, PA-C  dorzolamide-timolol (COSOPT) 22.3-6.8 MG/ML ophthalmic solution Place 1 drop into the right eye 2 (two) times daily.    [provider]  furosemide (LASIX) 20 MG tablet Take 1 tablet (20 mg total) by mouth daily. 02/08/18   Eugenie Filler, MD  lidocaine (LIDODERM) 5 % Place 1 patch onto the skin daily. Remove & Discard patch within 12 hours or as directed by MD Patient not taking: Reported on 05/16/2018 02/08/18   Eugenie Filler, MD  potassium chloride (K-DUR) 10 MEQ tablet  Take 1 tablet (10 mEq total) by mouth daily. 02/19/18   Tereasa Coop, PA-C  ramipril (ALTACE) 10 MG capsule Take 1 capsule (10 mg total) by mouth 2 (two) times daily. 10/15/17   Tereasa Coop, PA-C  simvastatin (ZOCOR) 40 MG tablet Take 0.5 tablets (20 mg total) by mouth at bedtime. 11/12/17   Tereasa Coop, PA-C  tolterodine (DETROL LA) 4 MG 24 hr capsule Take 4 mg by mouth daily.    [provider]  zolpidem (AMBIEN) 10 MG tablet Take 0.5-1 tablets (5-10 mg total) by mouth at bedtime as needed for sleep. 06/03/18   Wendie Agreste, MD    Family History Family History  Problem Relation Age of Onset  . Cancer Mother 67       colon cancer  . Stroke Mother 54       cause of death  . Heart disease Mother   . Hypertension Mother   . Heart disease Father 40       AMI; cause of death  . Heart disease Brother        AMI x 2; tobacco abuse; CABG  . Hernia Brother   . Hyperlipidemia Brother   . Hypertension Brother   . Migraines Daughter   . Heart disease Paternal Grandmother   . Esophageal cancer Neg Hx   . Rectal cancer Neg Hx   . Stomach cancer Neg Hx     Social History Social History   Tobacco Use  . Smoking status: Never Smoker  . Smokeless tobacco: Never Used  Substance Use Topics  . Alcohol use: No    Alcohol/week: 0.0 standard drinks    Comment: occasionally  . Drug use: No     Allergies   Avastin [bevacizumab]; Clindamycin/lincomycin; Novocain [procaine]; and Sulfa antibiotics   Review of Systems Review of Systems  Constitutional: Negative for activity change, appetite change, fatigue and fever.  HENT: Positive for nosebleeds. Negative for congestion and sinus pain.   Eyes: Negative for visual disturbance.  Respiratory: Negative for cough, chest tightness and shortness of breath.   Cardiovascular: Positive for leg swelling. Negative for chest pain.  Gastrointestinal: Negative for abdominal pain, nausea and vomiting.  Genitourinary: Negative for  dysuria, vaginal bleeding and vaginal discharge.  Musculoskeletal: Negative for arthralgias and myalgias.  Skin: Negative for rash.  Neurological: Positive for light-headedness. Negative for dizziness and headaches.    all other systems are negative except as noted in the HPI and PMH.    Physical Exam Updated Vital Signs BP (!) 203/82 (BP Location: Right Arm)   Pulse 62   Temp 98.2 F (36.8 C) (Oral)  Resp 14   SpO2 98%   Physical Exam  Constitutional: She is oriented to person, place, and time. She appears well-developed and well-nourished. No distress.  HENT:  Head: Normocephalic and atraumatic.  Mouth/Throat: Oropharynx is clear and moist. No oropharyngeal exudate.  Skin abrasion to right nasal septum  No blood in posterior oropharynx.  No septal hematoma  Eyes: Pupils are equal, round, and reactive to light. Conjunctivae and EOM are normal.  Neck: Normal range of motion. Neck supple.  No meningismus.  Cardiovascular: Normal rate, regular rhythm, normal heart sounds and intact distal pulses.  No murmur heard. Pulmonary/Chest: Effort normal and breath sounds normal. No respiratory distress.  Abdominal: Soft. There is no tenderness. There is no rebound and no guarding.  Musculoskeletal: Normal range of motion. She exhibits no edema or tenderness.  Neurological: She is alert and oriented to person, place, and time. No cranial nerve deficit. She exhibits normal muscle tone. Coordination normal.  No ataxia on finger to nose bilaterally. No pronator drift. 5/5 strength throughout. CN 2-12 intact.Equal grip strength. Sensation intact.   Skin: Skin is warm.  Psychiatric: She has a normal mood and affect. Her behavior is normal.  Nursing note and vitals reviewed.    ED Treatments / Results  Labs (all labs ordered are listed, but only abnormal results are displayed) Labs Reviewed  BASIC METABOLIC PANEL - Abnormal; Notable for the following components:      Result Value    Glucose, Bld 111 (*)    BUN 31 (*)    All other components within normal limits  CBC WITH DIFFERENTIAL/PLATELET  PROTIME-INR    EKG None  Radiology Dg Chest 2 View  Result Date: 06/17/2018 CLINICAL DATA:  Cough and nose bleed EXAM: CHEST - 2 VIEW COMPARISON:  02/07/2018 FINDINGS: The lungs are well inflated. There is mild cardiomegaly. There is no focal airspace consolidation or pulmonary edema. There is no pleural effusion or pneumothorax. IMPRESSION: No active cardiopulmonary disease. Electronically Signed   By: Ulyses Jarred M.D.   On: 06/17/2018 04:51    Procedures .Epistaxis Management Date/Time: 06/17/2018 7:17 AM Performed by: Ezequiel Essex, MD Authorized by: Ezequiel Essex, MD   Consent:    Consent obtained:  Verbal   Consent given by:  Patient   Risks discussed:  Bleeding and infection   Alternatives discussed:  No treatment Anesthesia (see MAR for exact dosages):    Anesthesia method:  None Procedure details:    Treatment site:  R anterior   Repair method: afrin.   Treatment complexity:  Limited   Treatment episode: recurring   Post-procedure details:    Assessment:  Bleeding stopped   Patient tolerance of procedure:  Tolerated well, no immediate complications   (including critical care time)  Medications Ordered in ED Medications  oxymetazoline (AFRIN) 0.05 % nasal spray 1 spray (has no administration in time range)  cloNIDine (CATAPRES) tablet 0.1 mg (has no administration in time range)  ramipril (ALTACE) capsule 10 mg (has no administration in time range)     Initial Impression / Assessment and Plan / ED Course  I have reviewed the triage vital signs and the nursing notes.  Pertinent labs & imaging results that were available during my care of the patient were reviewed by me and considered in my medical decision making (see chart for details).    Patient from home with episode of nosebleed that onset around 1 AM.  It is now controlled.  She does  take aspirin.  Blood  pressure uncontrolled and patient states it has been difficult to control in the past.  No chest pain or shortness of breath.  \Epistaxis has subsided with topical vasoconstrictors.  Patient remains hemodynamically stable.  Her blood pressure has improved after given her home medications.  No evidence of endorgan damage with stable hemoglobin and creatinine.  Suspect her epistaxis is likely related to her uncontrolled blood pressure.  Advised that she needs PCP follow-up for further adjustment of her blood pressure medications.  Return to the ED with headache, chest pain, focal weakness, any other concerns.  BP (!) 159/62   Pulse (!) 55   Temp 98.2 F (36.8 C) (Oral)   Resp 17   SpO2 97%    Final Clinical Impressions(s) / ED Diagnoses   Final diagnoses:  Right-sided epistaxis  Essential hypertension    ED Discharge Orders    None       Lakevia Perris, Annie Main, MD 06/17/18 [phone]

## 2018-06-17 NOTE — ED Triage Notes (Addendum)
Pt presents after a nosebleed x 2 hrs ago and coughing w/ eating x 2-3 weeks.  Pt reports she attempted "flush it and coughed out clots."  Sts her nose feels "clogged."  Denies pain.  Bleeding now controlled.  Pt takes a baby aspirin every night, but has not taken it tonight.     Triage was completed using the [facility].

## 2018-06-20 ENCOUNTER — Inpatient Hospital Stay: Payer: Medicare Other | Admitting: Physician Assistant

## 2018-06-20 ENCOUNTER — Other Ambulatory Visit: Payer: Self-pay

## 2018-06-20 ENCOUNTER — Ambulatory Visit (INDEPENDENT_AMBULATORY_CARE_PROVIDER_SITE_OTHER): Payer: Medicare Other | Admitting: Physician Assistant

## 2018-06-20 ENCOUNTER — Encounter: Payer: Self-pay | Admitting: Physician Assistant

## 2018-06-20 VITALS — BP 158/86 | HR 76 | Temp 98.6°F | Ht 64.0 in | Wt 187.0 lb

## 2018-06-20 DIAGNOSIS — Z1211 Encounter for screening for malignant neoplasm of colon: Secondary | ICD-10-CM

## 2018-06-20 DIAGNOSIS — Z09 Encounter for follow-up examination after completed treatment for conditions other than malignant neoplasm: Secondary | ICD-10-CM | POA: Diagnosis not present

## 2018-06-20 DIAGNOSIS — I10 Essential (primary) hypertension: Secondary | ICD-10-CM | POA: Diagnosis not present

## 2018-06-20 MED ORDER — METOPROLOL TARTRATE 50 MG PO TABS: 50.0000 mg | ORAL_TABLET | Freq: Two times a day (BID) | ORAL | 3 refills | Status: DC

## 2018-06-20 NOTE — [facility] Notes (Signed)
Melissa Murphy  MRN: [id number] DOB: [birth year]  PCP: Dorise Hiss, PA-C  Subjective:  Pt is a 82 year old female who presents to clinic for hospital f/u.  She presented to the ED 11/4 for nose bleed, suspected 2/2 elevated blood pressure. Blood pressure at that visit was 203/82.  No problems since then.   HTN - blood pressure today is 153/84. She is taking Catapres 0.1mg  bid, rampril 10mg  bid.  She takes Rampril 10 mg once daily - 2 pills makes her feel bad.  Blood pressure was well controlled with tenoretic, however this made her feel very weak.   She denies dizziness, chest pain, shortness of breath, DOE.  She has appt with Dr. Pamella Pert 11/16.    Review of Systems  HENT: Negative for nosebleeds.   Eyes: Negative for visual disturbance.  Respiratory: Negative for chest tightness and shortness of breath.   Cardiovascular: Negative for chest pain, palpitations and leg swelling.  Neurological: Negative for dizziness and headaches.    Patient Active Problem List   Diagnosis Date Noted  . Soft tissue injury of left lower leg 02/07/2018  . Deafness 04/03/2014  . Insomnia 04/01/2014  . Hiatal hernia 12/08/2013  . Obstructive sleep apnea 01/10/2013  . Uterine polyp   . Unspecified chronic bronchitis ([place]) 10/02/2012  . 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 Hypertensive urgency 04/30/2012  . Edema 09/18/2011  . Anemia 09/18/2011    Current Outpatient Medications on File Prior to Visit  Medication Sig Dispense Refill  . aspirin 81 MG chewable tablet Chew 81 mg by mouth daily.    . cloNIDine (CATAPRES) 0.1 MG tablet Take 1 tablet (0.1 mg total) by mouth 2 (two) times daily. 60 tablet 0  . docusate sodium (COLACE) 50 MG capsule Take 1 capsule (50 mg total) by mouth 3 (three) times daily as needed for mild constipation. 10 capsule 0  . dorzolamide-timolol (COSOPT) 22.3-6.8 MG/ML ophthalmic solution Place 1 drop into the right eye 2 (two) times daily.    .  furosemide (LASIX) 20 MG tablet Take 1 tablet (20 mg total) by mouth daily. 30 tablet 0  . lidocaine (LIDODERM) 5 % Place 1 patch onto the skin daily. Remove & Discard patch within 12 hours or as directed by MD 30 patch 0  . potassium chloride (K-DUR) 10 MEQ tablet Take 1 tablet (10 mEq total) by mouth daily. 90 tablet 1  . ramipril (ALTACE) 10 MG capsule Take 1 capsule (10 mg total) by mouth 2 (two) times daily. 180 capsule 3  . simvastatin (ZOCOR) 40 MG tablet Take 0.5 tablets (20 mg total) by mouth at bedtime. 90 tablet 3  . tolterodine (DETROL LA) 4 MG 24 hr capsule Take 4 mg by mouth daily.    Marland Kitchen zolpidem (AMBIEN) 10 MG tablet Take 0.5-1 tablets (5-10 mg total) by mouth at bedtime as needed for sleep. 30 tablet 0   No current facility-administered medications on file prior to visit.     Allergies  Allergen Reactions  . Avastin [Bevacizumab] Nausea And Vomiting  . Clindamycin/Lincomycin Itching  . Novocain [Procaine] Other (See Comments)    seizures  . Sulfa Antibiotics Other (See Comments)    fever     Objective:  BP (!) 153/84   Pulse 76   Temp 98.6 F (37 C) (Oral)   Ht 5\' 4"  (1.626 m)   Wt 187 lb (84.8 kg)   SpO2 96%   BMI 32.10 kg/m   Physical Exam  Constitutional: She  appears well-developed and well-nourished.  Cardiovascular: Normal rate, regular rhythm and normal heart sounds.  Skin: Skin is warm and dry.  Psychiatric: She has a normal mood and affect. Thought content normal.  Vitals reviewed.   Assessment and Plan :  1. Essential hypertension 2. Encounter for examination following treatment at hospital - pt here for hospital f/u. She presented to the ED 11/4 for nose bleed, suspected 2/2 elevated blood pressure. Blood pressure at that visit was 203/82. She is feeling well today and is asymptomatic. Blood pressure today is 153/84. She is taking Catapres 0.1mg  bid, rampril 10mg  qd. Plan to add metoprolol since this worked well in the past. RTC in 1 week to recheck  blood pressure. Consider cards referral if needed.  - Recheck vitals - metoprolol tartrate (LOPRESSOR) 50 MG tablet; Take 1 tablet (50 mg total) by mouth 2 (two) times daily.  Dispense: 180 tablet; Refill: 3   3. Screen for colon cancer - she is requesting referral for colonoscopy.  - Ambulatory referral to Gastroenterology   Mercer Pod, PA-C  [facility] 06/20/2018 3:17 PM  Please note: Portions of this report may have been transcribed using dragon voice recognition software. Every effort was made to ensure accuracy; however, inadvertent computerized transcription errors may be present.

## 2018-06-20 NOTE — Patient Instructions (Addendum)
  Start taking Metoprolol '50mg'$  twice daily.  Continue taking Catapres (clonidine) 0.'1mg'$  twice daily, and rampril '10mg'$  dauly.   Keep your follow-up appointment with Dr. Pamella Pert next week to follow-up on your blood pressure. We may need to send you to cardiology.    Thank you for coming in today. I hope you feel we met your needs.  Feel free to call PCP if you have any questions or further requests.  Please consider signing up for MyChart if you do not already have it, as this is a great way to communicate with me.  Best,  Whitney McVey, PA-C  IF you received an x-ray today, you will receive an invoice from [facility]. Please contact [facility] at [phone] with questions or concerns regarding your invoice.   IF you received labwork today, you will receive an invoice from [facility]. Please contact [facility] at [phone] with questions or concerns regarding your invoice.   Our billing staff will not be able to assist you with questions regarding bills from these companies.  You will be contacted with the lab results as soon as they are available. The fastest way to get your results is to activate your My Chart account. Instructions are located on the last page of this paperwork. If you have not heard from [place] regarding the results in 2 weeks, please contact this office.

## 2018-06-29 ENCOUNTER — Other Ambulatory Visit: Payer: Self-pay

## 2018-06-29 ENCOUNTER — Ambulatory Visit (INDEPENDENT_AMBULATORY_CARE_PROVIDER_SITE_OTHER): Payer: Medicare Other | Admitting: Family Medicine

## 2018-06-29 ENCOUNTER — Encounter: Payer: Self-pay | Admitting: Family Medicine

## 2018-06-29 VITALS — BP 138/82 | HR 69 | Temp 97.9°F | Resp 14 | Ht 64.0 in | Wt 188.2 lb

## 2018-06-29 DIAGNOSIS — G4700 Insomnia, unspecified: Secondary | ICD-10-CM | POA: Diagnosis not present

## 2018-06-29 DIAGNOSIS — I10 Essential (primary) hypertension: Secondary | ICD-10-CM

## 2018-06-29 MED ORDER — TRAZODONE HCL 50 MG PO TABS: 50.0000 mg | ORAL_TABLET | Freq: Every day | ORAL | 3 refills | Status: DC

## 2018-06-29 NOTE — Patient Instructions (Addendum)
Start trazadone at 50mg  at bedtime if no results after a week, then increase to 100mg  at bedtime   If you have lab work done today you will be contacted with your lab results within the next 2 weeks.  If you have not heard from [place] then please contact us. The fastest way to get your results is to register for My Chart.  Insomnia Insomnia is a sleep disorder that makes it difficult to fall asleep or to stay asleep. Insomnia can cause tiredness (fatigue), low energy, difficulty concentrating, mood swings, and poor performance at work or school. There are three different ways to classify insomnia:  Difficulty falling asleep.  Difficulty staying asleep.  Waking up too early in the morning.  Any type of insomnia can be long-term (chronic) or short-term (acute). Both are common. Short-term insomnia usually lasts for three months or less. Chronic insomnia occurs at least three times a week for longer than three months. What are the causes? Insomnia may be caused by another condition, situation, or substance, such as:  Anxiety.  Certain medicines.  Gastroesophageal reflux disease (GERD) or other gastrointestinal conditions.  Asthma or other breathing conditions.  Restless legs syndrome, sleep apnea, or other sleep disorders.  Chronic pain.  Menopause. This may include hot flashes.  Stroke.  Abuse of alcohol, tobacco, or illegal drugs.  Depression.  Caffeine.  Neurological disorders, such as Alzheimer disease.  An overactive thyroid (hyperthyroidism).  The cause of insomnia may not be known. What increases the risk? Risk factors for insomnia include:  Gender. Women are more commonly affected than men.  Age. Insomnia is more common as you get older.  Stress. This may involve your professional or personal life.  Income. Insomnia is more common in people with lower income.  Lack of exercise.  Irregular work schedule or night shifts.  Traveling between different time  zones.  What are the signs or symptoms? If you have insomnia, trouble falling asleep or trouble staying asleep is the main symptom. This may lead to other symptoms, such as:  Feeling fatigued.  Feeling nervous about going to sleep.  Not feeling rested in the morning.  Having trouble concentrating.  Feeling irritable, anxious, or depressed.  How is this treated? Treatment for insomnia depends on the cause. If your insomnia is caused by an underlying condition, treatment will focus on addressing the condition. Treatment may also include:  Medicines to help you sleep.  Counseling or therapy.  Lifestyle adjustments.  Follow these instructions at home:  Take medicines only as directed by your health care provider.  Keep regular sleeping and waking hours. Avoid naps.  Keep a sleep diary to help you and your health care provider figure out what could be causing your insomnia. Include: ? When you sleep. ? When you wake up during the night. ? How well you sleep. ? How rested you feel the next day. ? Any side effects of medicines you are taking. ? What you eat and drink.  Make your bedroom a comfortable place where it is easy to fall asleep: ? Put up shades or special blackout curtains to block light from outside. ? Use a white noise machine to block noise. ? Keep the temperature cool.  Exercise regularly as directed by your health care provider. Avoid exercising right before bedtime.  Use relaxation techniques to manage stress. Ask your health care provider to suggest some techniques that may work well for you. These may include: ? Breathing exercises. ? Routines to release muscle  tension. ? Visualizing peaceful scenes.  Cut back on alcohol, caffeinated beverages, and cigarettes, especially close to bedtime. These can disrupt your sleep.  Do not overeat or eat spicy foods right before bedtime. This can lead to digestive discomfort that can make it hard for you to  sleep.  Limit screen use before bedtime. This includes: ? Watching TV. ? Using your smartphone, tablet, and computer.  Stick to a routine. This can help you fall asleep faster. Try to do a quiet activity, brush your teeth, and go to bed at the same time each night.  Get out of bed if you are still awake after 15 minutes of trying to sleep. Keep the lights down, but try reading or doing a quiet activity. When you feel sleepy, go back to bed.  Make sure that you drive carefully. Avoid driving if you feel very sleepy.  Keep all follow-up appointments as directed by your health care provider. This is important. Contact a health care provider if:  You are tired throughout the day or have trouble in your daily routine due to sleepiness.  You continue to have sleep problems or your sleep problems get worse. Get help right away if:  You have serious thoughts about hurting yourself or someone else. This information is not intended to replace advice given to you by your health care provider. Make sure you discuss any questions you have with your health care provider. Document Released: 07/28/2000 Document Revised: 12/31/2015 Document Reviewed: 05/01/2014 Elsevier Interactive Patient Education  2018 [employer].   IF you received an x-ray today, you will receive an invoice from [facility]. Please contact [facility] Radiology at [phone] with questions or concerns regarding your invoice.   IF you received labwork today, you will receive an invoice from [place]. Please contact [facility] at [phone] with questions or concerns regarding your invoice.   Our billing staff will not be able to assist you with questions regarding bills from these companies.  You will be contacted with the lab results as soon as they are available. The fastest way to get your results is to activate your My Chart account. Instructions are located on the last page of this paperwork. If you have not heard  from [place] regarding the results in 2 weeks, please contact this office.

## 2018-06-29 NOTE — [place] Notes (Signed)
11/16/201912:49 PM  Melissa Murphy [birth year], 82 y.o. female [id number]  Chief Complaint  Patient presents with  . Insomnia    need refill on ambien    HPI:   Patient is a 82 y.o. female with past medical history significant for HTN, insomnia, deaf, OSA, HLP, urge incontinence who presents today to discuss insomnia  ASL interpreter present Moved recently from [place] 10mg  daily for years pmp reviewed  Last OV added metoprolol  She is tolerating metoprolol well Denies CP, SOB, fatigue, dizziness, falls  She does not use cpap for her OSA She got scarred because she fainted after having sleep study Did not like big mask Her sleep study was in 2014, weighed 204 lbs She has lost about 20 lbs Watching her diet  Has long standing h/o insomnia Unable to fall asleep, normally the entire night Sometimes she does take a nap for about 2 hours Feels tired but unable to fall asleep Worked night shift for many years Other meds tried: OTC meds did not help, made her drowsy Wakes up well with Lorrin Mais Per chart review it does not seem she has ever tried any other meds   Fall Risk  06/29/2018 06/20/2018 06/11/2018 02/22/2018 11/12/2017  Falls in the past year? 0 0 No No No  Number falls in past yr: - - - - -  Injury with Fall? - - - - -     Depression screen [facility] 2/9 06/29/2018 06/11/2018 02/22/2018  Decreased Interest 0 0 0  Down, Depressed, Hopeless 0 0 0  PHQ - 2 Score 0 0 0    Allergies  Allergen Reactions  . Avastin [Bevacizumab] Nausea And Vomiting  . Clindamycin/Lincomycin Itching  . Novocain [Procaine] Other (See Comments)    seizures  . Sulfa Antibiotics Other (See Comments)    fever    Prior to Admission medications   Medication Sig Start Date End Date Taking? Authorizing Provider  aspirin 81 MG chewable tablet Chew 81 mg by mouth daily.   Yes [provider]  cloNIDine (CATAPRES) 0.1 MG tablet Take 1 tablet (0.1 mg total) by mouth 2 (two) times  daily. 02/08/18  Yes Eugenie Filler, MD  furosemide (LASIX) 20 MG tablet Take 1 tablet (20 mg total) by mouth daily. 02/08/18  Yes Eugenie Filler, MD  metoprolol tartrate (LOPRESSOR) 50 MG tablet Take 1 tablet (50 mg total) by mouth 2 (two) times daily. 06/20/18  Yes McVey, Gelene Mink, PA-C  potassium chloride (K-DUR) 10 MEQ tablet Take 1 tablet (10 mEq total) by mouth daily. 02/19/18  Yes Tereasa Coop, PA-C  ramipril (ALTACE) 10 MG capsule Take 1 capsule (10 mg total) by mouth 2 (two) times daily. 10/15/17  Yes Tereasa Coop, PA-C  simvastatin (ZOCOR) 40 MG tablet Take 0.5 tablets (20 mg total) by mouth at bedtime. 11/12/17  Yes Tereasa Coop, PA-C  tolterodine (DETROL LA) 4 MG 24 hr capsule Take 4 mg by mouth daily.   Yes [provider]  zolpidem (AMBIEN) 10 MG tablet Take 0.5-1 tablets (5-10 mg total) by mouth at bedtime as needed for sleep. 06/03/18  Yes Wendie Agreste, MD    Past Medical History:  Diagnosis Date  . Anemia   . Arthritis   . Cataract   . Clotting disorder ([place])   . Deaf    Congenital; Requires an interpreter  . DVT (deep venous thrombosis) ([facility])    in eye, and leg  . Edema   .  GERD (gastroesophageal reflux disease)   . Glaucoma   . Hernia   . Hiatal hernia   . Hyperlipidemia   . Hypertension   . Osteoporosis   . Sleep apnea   . Thyroid disease   . Ulcer   . Urinary incontinence   . Uterine polyp     Past Surgical History:  Procedure Laterality Date  . CATARACT EXTRACTION     both eyes  . DILATATION & CURRETTAGE/HYSTEROSCOPY WITH RESECTOCOPE N/A 10/11/2012   Procedure: DILATATION & CURETTAGE/HYSTEROSCOPY WITH RESECTOCOPE;  Surgeon: Allena Katz, MD;  Location: [facility];  Service: Gynecology;  Laterality: N/A;  pt is deaf; please contact daughter Heide Spark [phone]  . EYE SURGERY    . goiter removed     age 13  . HERNIA REPAIR    . HIATAL HERNIA REPAIR N/A 12/08/2013   Procedure: LAPAROSCOPIC REPAIR OF HIATAL HERNIA  ;   Surgeon: [name], MD;  Location: [facility];  Service: General;  Laterality: N/A;  . REFRACTIVE SURGERY    . TONSILLECTOMY    . xanthoma tumor removed      Social History   Tobacco Use  . Smoking status: Never Smoker  . Smokeless tobacco: Never Used  Substance Use Topics  . Alcohol use: No    Alcohol/week: 0.0 standard drinks    Comment: occasionally    Family History  Problem Relation Age of Onset  . Cancer Mother 80       colon cancer  . Stroke Mother 43       cause of death  . Heart disease Mother   . Hypertension Mother   . Heart disease Father 30       AMI; cause of death  . Heart disease Brother        AMI x 2; tobacco abuse; CABG  . Hernia Brother   . Hyperlipidemia Brother   . Hypertension Brother   . Migraines Daughter   . Heart disease Paternal Grandmother   . Esophageal cancer Neg Hx   . Rectal cancer Neg Hx   . Stomach cancer Neg Hx     ROS Per hpi  OBJECTIVE:  Blood pressure 138/82, pulse 69, temperature 97.9 F (36.6 C), temperature source Oral, resp. rate 14, height 5\' 4"  (1.626 m), weight 188 lb 3.2 oz (85.4 kg), SpO2 97 %. Body mass index is 32.3 kg/m.    Physical Exam  Constitutional: She is oriented to person, place, and time. She appears well-developed and well-nourished.  HENT:  Head: Normocephalic and atraumatic.  Mouth/Throat: Oropharynx is clear and moist. No oropharyngeal exudate.  Eyes: Pupils are equal, round, and reactive to light. Conjunctivae and EOM are normal. No scleral icterus.  Neck: Neck supple.  Cardiovascular: Normal rate, regular rhythm and normal heart sounds. Exam reveals no gallop and no friction rub.  No murmur heard. Pulmonary/Chest: Effort normal and breath sounds normal. She has no wheezes. She has no rales.  Musculoskeletal: She exhibits edema.  Neurological: She is alert and oriented to person, place, and time.  Skin: Skin is warm and dry.  Psychiatric: She has a normal mood and affect.  Nursing note  and vitals reviewed.    ASSESSMENT and PLAN  1. Essential hypertension Controlled. Continue current regime.   2. Insomnia, unspecified type More than > 50% of 25 min for this visit was spent of education and counseling. Discussed concerns for ambien. Has not tried any other meds. Long h/o working night shifts. Wondering if  OSA contributing, though she has lost > 20 lbs since last sleep study. Will do trial of trazodone. Discussed r/se/b. Discussed sleep hygiene.   Other orders - traZODone (DESYREL) 50 MG tablet; Take 1-2 tablets (50-100 mg total) by mouth at bedtime.  Return in about 4 weeks (around 07/27/2018) for insomnia.    Rutherford Guys, MD [facility] at [facility], [place] [address] Ph.  [phone] Fax [phone]

## 2018-07-08 ENCOUNTER — Encounter: Payer: Self-pay | Admitting: Gynecology

## 2018-07-08 ENCOUNTER — Ambulatory Visit (INDEPENDENT_AMBULATORY_CARE_PROVIDER_SITE_OTHER): Payer: Medicare Other | Admitting: Gynecology

## 2018-07-08 VITALS — BP 136/84 | Ht 64.0 in | Wt 192.0 lb

## 2018-07-08 DIAGNOSIS — N3945 Continuous leakage: Secondary | ICD-10-CM

## 2018-07-08 DIAGNOSIS — N952 Postmenopausal atrophic vaginitis: Secondary | ICD-10-CM

## 2018-07-08 NOTE — Patient Instructions (Signed)
Office will call to arrange for the urology appointment.

## 2018-07-08 NOTE — [place] Notes (Signed)
    Melissa Murphy June 19, 1936 [id number]        82 y.o.  [id number] new patient who presents with deaf interpreter complaining of urinary incontinence.  She has a past history of use of pessary with prolapse but no longer is using the pessary.  Notes loss of urine on and off and a dribbling type fashion.  Notes loss of urine while sleeping as well as throughout the day.  Is becoming an issue with traveling and hygiene.  Has seen a urologist in the past who recommended injections.  No dysuria, urgency, low back pain to suggest UTI.  No changes in bowel habits.  Had D&C by Dr. Gertie Fey for postmenopausal bleeding in 2014.  Otherwise has a benign gynecologic history.  Past medical history,surgical history, problem list, medications, allergies, family history and social history were all reviewed and documented in the EPIC chart.  Directed ROS with pertinent positives and negatives documented in the history of present illness/assessment and plan.  Exam: Caryn Bee assistant Vitals:   07/08/18 1429  BP: 136/84  Weight: 192 lb (87.1 kg)  Height: 5\' 4"  (1.626 m)   General appearance:  Normal Abdomen soft nontender without masses guarding rebound Pelvic external BUS vagina with atrophic changes.  No significant cystocele, rectocele or uterine prolapse.  Cervix difficult to visualize secondary to collapsing vaginal mucosa but palpates normal.  Uterus unable to palpate due to abdominal girth.  Adnexa without gross masses or tenderness.  Rectal exam is normal without significant rectocele.  Assessment/Plan:  82 y.o. [id number] with history of urinary incontinence that sounds to have been evaluated by urology recommending injections.  Not having significant stress or urgency symptoms.  It sounds as if she is having intrinsic sphincter deficiency and they were probably recommending collagen injections.  I reviewed with the patient I did not see significant prolapse to warrant the use of a pessary.  My  recommendations would be to follow-up with urology that she saw previously and follow their recommendations.  Patient agrees with the plan and will help her facilitate the appointment.  We will check baseline urine analysis today.    Anastasio Auerbach MD, 3:40 PM 07/08/2018

## 2018-07-09 ENCOUNTER — Ambulatory Visit: Payer: Self-pay

## 2018-07-09 ENCOUNTER — Other Ambulatory Visit: Payer: Self-pay | Admitting: Family Medicine

## 2018-07-09 MED ORDER — SUVOREXANT 10 MG PO TABS: 10.0000 mg | ORAL_TABLET | Freq: Every day | ORAL | 2 refills | Status: DC

## 2018-07-10 ENCOUNTER — Telehealth: Payer: Self-pay | Admitting: Physician Assistant

## 2018-07-10 DIAGNOSIS — G4700 Insomnia, unspecified: Secondary | ICD-10-CM

## 2018-07-10 LAB — URINALYSIS, COMPLETE W/RFL CULTURE
BILIRUBIN URINE: NEGATIVE
GLUCOSE, UA: NEGATIVE
HGB URINE DIPSTICK: NEGATIVE
Hyaline Cast: NONE SEEN /LPF
KETONES UR: NEGATIVE
NITRITES URINE, INITIAL: NEGATIVE
PH: 5 (ref 5.0–8.0)
Protein, ur: NEGATIVE
RBC / HPF: NONE SEEN /HPF (ref 0–2)
Specific Gravity, Urine: 1.015 (ref 1.001–1.03)

## 2018-07-10 LAB — URINE CULTURE
MICRO NUMBER:: 91424305
SPECIMEN QUALITY:: ADEQUATE

## 2018-07-10 LAB — CULTURE INDICATED

## 2018-07-10 NOTE — Telephone Encounter (Addendum)
Copied from [place] [id number]. Topic: Quick Communication - See Telephone Encounter >> Jul 10, 2018 12:42 PM Ivar Drape wrote: CRM for notification. See Telephone encounter for: 07/10/18. Patient went to the pharmacy today to pick up the Suvorexant (BELSOMRA) 10 MG TABS medication that she was prescribed and it cost $175.00 and that is too much for her to pay.  She would like to go back to her old medication zolpidem (AMBIEN) 10 MG tablet.

## 2018-07-15 ENCOUNTER — Telehealth: Payer: Self-pay | Admitting: *Deleted

## 2018-07-15 MED ORDER — ZOLPIDEM TARTRATE 10 MG PO TABS: 5.0000 mg | ORAL_TABLET | Freq: Every evening | ORAL | 2 refills | Status: DC | PRN

## 2018-07-15 NOTE — Telephone Encounter (Signed)
-----   Message from Anastasio Auerbach, MD sent at 07/08/2018  3:44 PM EST ----- Patient reports having seen urology before at [facility] urology.  She needs another follow-up appointment in reference to incontinence.

## 2018-07-15 NOTE — Telephone Encounter (Signed)
Referral placed in [place], notes faxed , they will call to schedule.

## 2018-07-16 ENCOUNTER — Ambulatory Visit: Payer: Self-pay

## 2018-07-16 NOTE — Telephone Encounter (Signed)
Call placed to pt.  Left message per [employer] that the information about cost of the Belsoma has been sent through to Dr. Pamella Pert (per note of 07/13/18), and she will be notified about further recommendations by nurse in office.      Message from Berneta Levins sent at 07/16/2018 2:29 PM EST   Summary: medication issue   Pt calling. States that she went to see how much Belsoma was going to be - it will be $173 and she can't afford that. Pt states that she wants to know what else she can take. Pt wants to know if she should go back to [place].  Pt states it is OK to leave a message since she uses a relay service

## 2018-07-16 NOTE — Telephone Encounter (Signed)
  Reason for Disposition . [1] Caller requesting NON-URGENT health information AND [2] PCP's office is the best resource  Answer Assessment - Initial Assessment Questions 1. REASON FOR CALL or QUESTION: "What is your reason for calling today?" or "How can I best help you?" or "What question do you have that I can help answer?"     Needs to change sleeping medication, due to cost of new Rx, Belsoma  Protocols used: INFORMATION ONLY CALL-A-AH

## 2018-07-17 ENCOUNTER — Telehealth: Payer: Self-pay | Admitting: Physician Assistant

## 2018-07-17 NOTE — Telephone Encounter (Signed)
Please advise 

## 2018-07-17 NOTE — Telephone Encounter (Signed)
Previous medication was [place].  We will discuss at her next appt with me thanks

## 2018-07-17 NOTE — Telephone Encounter (Signed)
Pt called to f/up on this request.  If approved, please send:    zolpidem (AMBIEN) 10 MG tablet to:   [id number] [address] [place], [place] - [address]  [place] [address]  Phone: [phone] Fax: [phone]

## 2018-07-17 NOTE — Telephone Encounter (Signed)
Copied from [place] [id number]. Topic: General - Other >> Jul 17, 2018  1:44 PM Bea Graff, NT wrote: Reason for CRM: Pt calling to check status on med change. Please see triage note from yesterday.

## 2018-07-17 NOTE — Telephone Encounter (Signed)
Patient's concern/request has been addressed already on Jul 15 2018

## 2018-07-23 NOTE — Telephone Encounter (Signed)
LVM to call clinic back.

## 2018-07-24 ENCOUNTER — Other Ambulatory Visit: Payer: Self-pay | Admitting: Urgent Care

## 2018-07-25 NOTE — Telephone Encounter (Signed)
Patient scheduled 08/23/18 @ 10:00am with Dr. Matilde Sprang

## 2018-07-26 NOTE — Telephone Encounter (Signed)
Left detailed message on primary number with time and date.

## 2018-07-26 NOTE — Telephone Encounter (Signed)
Requested medication (s) are due for refill today: Yes  Requested medication (s) are on the active medication list: Yes  Last refill:  07/04/17  Future visit scheduled: Yes  Notes to clinic:  Different provider name with medication and prescription has expired.    Requested Prescriptions  Pending Prescriptions Disp Refills   cloNIDine (CATAPRES) 0.1 MG tablet [Pharmacy Med Name: CLONIDINE HCL 0.1 MG TABLET] 180 tablet 1    Sig: Take 1 tablet (0.1 mg total) by mouth 2 (two) times daily.     Cardiovascular:  Alpha-2 Agonists Passed - 07/24/2018  2:04 PM      Passed - Last BP in normal range    BP Readings from Last 1 Encounters:  07/08/18 136/84         Passed - Last Heart Rate in normal range    Pulse Readings from Last 1 Encounters:  06/29/18 69         Passed - Valid encounter within last 6 months    Recent Outpatient Visits          3 weeks ago Essential hypertension   [facility], Lilia Argue, MD   1 month ago Essential hypertension   Primary Care at [facility], Gelene Mink, PA-C   1 month ago Peripheral edema   [facility] at [place], MD   2 months ago Constipation, unspecified constipation type   Primary Care at [facility], Gelene Mink, PA-C   5 months ago Labile hypertension   [facility] at [facility], Audrie Lia, PA-C      Future Appointments            In 3 days Rutherford Guys, MD Primary Care at [place], [facility]   In 3 weeks Carlota Raspberry Ranell Patrick, MD Primary Care at [place], [facility]

## 2018-07-29 ENCOUNTER — Encounter: Payer: Self-pay | Admitting: Family Medicine

## 2018-07-29 ENCOUNTER — Other Ambulatory Visit: Payer: Self-pay

## 2018-07-29 ENCOUNTER — Encounter: Payer: Self-pay | Admitting: Physician Assistant

## 2018-07-29 ENCOUNTER — Ambulatory Visit (INDEPENDENT_AMBULATORY_CARE_PROVIDER_SITE_OTHER): Payer: Medicare Other | Admitting: Family Medicine

## 2018-07-29 VITALS — BP 187/92 | HR 74 | Temp 98.5°F | Resp 18 | Ht 64.0 in | Wt 189.0 lb

## 2018-07-29 DIAGNOSIS — G4700 Insomnia, unspecified: Secondary | ICD-10-CM | POA: Diagnosis not present

## 2018-07-29 DIAGNOSIS — I10 Essential (primary) hypertension: Secondary | ICD-10-CM

## 2018-07-29 DIAGNOSIS — Z86010 Personal history of colonic polyps: Secondary | ICD-10-CM | POA: Diagnosis not present

## 2018-07-29 DIAGNOSIS — Z1211 Encounter for screening for malignant neoplasm of colon: Secondary | ICD-10-CM | POA: Diagnosis not present

## 2018-07-29 MED ORDER — CLONIDINE HCL 0.1 MG PO TABS: 0.1000 mg | ORAL_TABLET | Freq: Two times a day (BID) | ORAL | 1 refills | Status: DC

## 2018-07-29 NOTE — Patient Instructions (Signed)
° ° ° °  If you have lab work done today you will be contacted with your lab results within the next 2 weeks.  If you have not heard from us then please contact us. The fastest way to get your results is to register for My Chart. ° ° °IF you received an x-ray today, you will receive an invoice from [facility]. Please contact [facility] at [phone] with questions or concerns regarding your invoice.  ° °IF you received labwork today, you will receive an invoice from [facility]. Please contact [facility] at [phone] with questions or concerns regarding your invoice.  ° °Our billing staff will not be able to assist you with questions regarding bills from these companies. ° °You will be contacted with the lab results as soon as they are available. The fastest way to get your results is to activate your My Chart account. Instructions are located on the last page of this paperwork. If you have not heard from us regarding the results in 2 weeks, please contact this office. °  ° ° ° °

## 2018-07-29 NOTE — Telephone Encounter (Signed)
Refill request for Clonidine. Last given by Irine Seal, MD. Pt has appointment schedule today at 2:00 pm with you. thanks

## 2018-07-29 NOTE — Telephone Encounter (Signed)
Patient's concern/request has been addressed already by another provider or staff member. 

## 2018-07-29 NOTE — Progress Notes (Signed)
12/16/20192:26 PM  Melissa Murphy 29-Aug-1935, 82 y.o. female [id number]  Chief Complaint  Patient presents with  . Medication Refill    clonidine  . Insomnia    HPI:   Patient is a 82 y.o. female with past medical history significant for HTN, insomnia, deaf, OSA, HLP, urge incontinence who presents today to discuss insomnia  Last OV a month ago Trial of trazodone, failed Unable to afford belsomra Placed back on [place] - happy, sleeping well, denies any side effects  Patient requesting her next appt with Dr Carlota Raspberry  Has been wo clonidine for about 3-4 days ago Non smoker  Chronic LE edema after she had an accident/burn Does wear compression stockings, was in a hurry today  Last colonoscopy 2013, + serrated polyp Her mother died of colonoscopy  ASL interpreter present during this visit  Fall Risk  07/29/2018 06/29/2018 06/20/2018 06/11/2018 02/22/2018  Falls in the past year? 0 0 0 No No  Number falls in past yr: - - - - -  Injury with Fall? - - - - -     Depression screen [facility] 2/9 07/29/2018 06/29/2018 06/11/2018  Decreased Interest 0 0 0  Down, Depressed, Hopeless 0 0 0  PHQ - 2 Score 0 0 0    Allergies  Allergen Reactions  . Avastin [Bevacizumab] Nausea And Vomiting  . Clindamycin/Lincomycin Itching  . Novocain [Procaine] Other (See Comments)    seizures  . Sulfa Antibiotics Other (See Comments)    fever    Prior to Admission medications   Medication Sig Start Date End Date Taking? Authorizing Provider  aspirin 81 MG chewable tablet Chew 81 mg by mouth daily.   Yes [provider]  cloNIDine (CATAPRES) 0.1 MG tablet Take 1 tablet (0.1 mg total) by mouth 2 (two) times daily. 02/08/18  Yes Eugenie Filler, MD  furosemide (LASIX) 20 MG tablet Take 1 tablet (20 mg total) by mouth daily. 02/08/18  Yes Eugenie Filler, MD  metoprolol tartrate (LOPRESSOR) 50 MG tablet Take 1 tablet (50 mg total) by mouth 2 (two) times daily. 06/20/18  Yes McVey,  Gelene Mink, PA-C  potassium chloride (K-DUR) 10 MEQ tablet Take 1 tablet (10 mEq total) by mouth daily. 02/19/18  Yes Tereasa Coop, PA-C  ramipril (ALTACE) 10 MG capsule Take 1 capsule (10 mg total) by mouth 2 (two) times daily. 10/15/17  Yes Tereasa Coop, PA-C  simvastatin (ZOCOR) 40 MG tablet Take 0.5 tablets (20 mg total) by mouth at bedtime. 11/12/17  Yes Tereasa Coop, PA-C  tolterodine (DETROL LA) 4 MG 24 hr capsule Take 4 mg by mouth daily.   Yes [provider]  zolpidem (AMBIEN) 10 MG tablet Take 0.5-1 tablets (5-10 mg total) by mouth at bedtime as needed for sleep. 07/15/18  Yes Rutherford Guys, MD    Past Medical History:  Diagnosis Date  . Anemia   . Arthritis   . Cataract   . Clotting disorder ([place])   . Deaf    Congenital; Requires an interpreter  . DVT (deep venous thrombosis) (HCC)    in eye, and leg  . Edema   . GERD (gastroesophageal reflux disease)   . Glaucoma   . Hernia   . Hiatal hernia   . Hyperlipidemia   . Hypertension   . Osteoporosis   . Sleep apnea   . Thyroid disease   . Ulcer   . Urinary incontinence   . Uterine polyp     Past  Surgical History:  Procedure Laterality Date  . CATARACT EXTRACTION     both eyes  . DILATATION & CURRETTAGE/HYSTEROSCOPY WITH RESECTOCOPE N/A 10/11/2012   Procedure: DILATATION & CURETTAGE/HYSTEROSCOPY WITH RESECTOCOPE;  Surgeon: Allena Katz, MD;  Location: [facility];  Service: Gynecology;  Laterality: N/A;  pt is deaf; please contact daughter Heide Spark [phone]  . EYE SURGERY    . goiter removed     age 84  . HERNIA REPAIR    . HIATAL HERNIA REPAIR N/A 12/08/2013   Procedure: LAPAROSCOPIC REPAIR OF HIATAL HERNIA  ;  Surgeon: [name], MD;  Location: [facility];  Service: General;  Laterality: N/A;  . REFRACTIVE SURGERY    . TONSILLECTOMY    . xanthoma tumor removed      Social History   Tobacco Use  . Smoking status: Never Smoker  . Smokeless tobacco: Never Used  Substance Use  Topics  . Alcohol use: Yes    Alcohol/week: 0.0 standard drinks    Comment: occasionally    Family History  Problem Relation Age of Onset  . Cancer Mother 75       colon cancer  . Stroke Mother 5       cause of death  . Heart disease Mother   . Hypertension Mother   . Heart disease Father 52       AMI; cause of death  . Heart disease Brother        AMI x 2; tobacco abuse; CABG  . Hernia Brother   . Hyperlipidemia Brother   . Hypertension Brother   . Migraines Daughter   . Heart disease Paternal Grandmother   . Esophageal cancer Neg Hx   . Rectal cancer Neg Hx   . Stomach cancer Neg Hx     Review of Systems  Constitutional: Negative for chills and fever.  Respiratory: Negative for cough and shortness of breath.   Cardiovascular: Positive for leg swelling. Negative for chest pain and palpitations.  Gastrointestinal: Negative for abdominal pain, nausea and vomiting.     OBJECTIVE:  Blood pressure (!) 187/92, pulse 74, temperature 98.5 F (36.9 C), temperature source Oral, resp. rate 18, height 5\' 4"  (1.626 m), weight 189 lb (85.7 kg), SpO2 97 %. Body mass index is 32.44 kg/m.   BP Readings from Last 3 Encounters:  07/29/18 (!) 187/92  07/08/18 136/84  06/29/18 138/82   Wt Readings from Last 3 Encounters:  07/29/18 189 lb (85.7 kg)  07/08/18 192 lb (87.1 kg)  06/29/18 188 lb 3.2 oz (85.4 kg)    Physical Exam Vitals signs and nursing note reviewed.  Constitutional:      Appearance: She is well-developed.  HENT:     Head: Normocephalic and atraumatic.     Mouth/Throat:     Pharynx: No oropharyngeal exudate.  Eyes:     General: No scleral icterus.    Conjunctiva/sclera: Conjunctivae normal.     Pupils: Pupils are equal, round, and reactive to light.  Neck:     Musculoskeletal: Neck supple.  Cardiovascular:     Rate and Rhythm: Normal rate and regular rhythm.     Heart sounds: Normal heart sounds. No murmur. No friction rub. No gallop.   Pulmonary:      Effort: Pulmonary effort is normal.     Breath sounds: Normal breath sounds. No wheezing or rales.  Musculoskeletal:     Left lower leg: Edema present.  Skin:    General: Skin is warm and dry.  Neurological:     Mental Status: She is alert and oriented to person, place, and time.     ASSESSMENT and PLAN  1. Essential hypertension Uncontrolled in setting of being off meds. Previous BP at goal. Refilled clonidine today. ER precautions reviewed.   2. Insomnia, unspecified type Controlled. Continue current regime. Tried several alternatives, either did not work or not affordable. Reviewed r/se/b ambien  3. Screen for colon cancer 4. History of colonic polyps - Ambulatory referral to Gastroenterology  Other orders - cloNIDine (CATAPRES) 0.1 MG tablet; Take 1 tablet (0.1 mg total) by mouth 2 (two) times daily.   Return in about 3 months (around 10/28/2018) for Dr Carlota Raspberry.    Rutherford Guys, MD Primary Care at [place], [place] [address] Ph.  [phone] Fax [phone]

## 2018-07-30 NOTE — Telephone Encounter (Signed)
LVM for pt to call the clinic to sched an appt to est care w new provider

## 2018-08-15 DIAGNOSIS — L820 Inflamed seborrheic keratosis: Secondary | ICD-10-CM | POA: Diagnosis not present

## 2018-08-18 ENCOUNTER — Other Ambulatory Visit: Payer: Self-pay | Admitting: Internal Medicine

## 2018-08-18 DIAGNOSIS — E7800 Pure hypercholesterolemia, unspecified: Principal | ICD-10-CM

## 2018-08-19 ENCOUNTER — Ambulatory Visit: Payer: Medicare Other | Admitting: Family Medicine

## 2018-08-19 NOTE — Telephone Encounter (Signed)
Is she still a patient here? Last refill was instructed to make appointment prior to any further refills- [place]

## 2018-08-19 NOTE — Telephone Encounter (Signed)
[facility] request  Last fill date 05/13/2018.    Vivyan Biggers, LVN

## 2018-08-20 NOTE — Telephone Encounter (Signed)
Called pt - number is disconnected or NIS.    Melinda [name]  [facility]  [phone]

## 2018-08-21 ENCOUNTER — Other Ambulatory Visit: Payer: Self-pay | Admitting: Physician Assistant

## 2018-08-22 ENCOUNTER — Encounter: Payer: Self-pay | Admitting: Gastroenterology

## 2018-08-24 ENCOUNTER — Other Ambulatory Visit: Payer: Self-pay

## 2018-08-24 ENCOUNTER — Emergency Department (HOSPITAL_COMMUNITY)
Admission: EM | Admit: 2018-08-24 | Discharge: 2018-08-25 | Disposition: A | Discharge status: Home / Self Care | Payer: Medicare Other | Attending: Emergency Medicine | Admitting: Emergency Medicine

## 2018-08-24 ENCOUNTER — Encounter (HOSPITAL_COMMUNITY): Payer: Self-pay

## 2018-08-24 DIAGNOSIS — M7989 Other specified soft tissue disorders: Secondary | ICD-10-CM | POA: Diagnosis not present

## 2018-08-24 DIAGNOSIS — R2240 Localized swelling, mass and lump, unspecified lower limb: Secondary | ICD-10-CM | POA: Diagnosis present

## 2018-08-24 DIAGNOSIS — I10 Essential (primary) hypertension: Secondary | ICD-10-CM | POA: Diagnosis not present

## 2018-08-24 MED ORDER — ENOXAPARIN SODIUM 80 MG/0.8ML ~~LOC~~ SOLN
80.0000 mg | Freq: Once | SUBCUTANEOUS | Status: AC
  Administered 2018-08-25: 80 mg via SUBCUTANEOUS
  Filled 2018-08-24: qty 0.8

## 2018-08-24 MED ORDER — CEFTRIAXONE SODIUM 1 G IJ SOLR
1.0000 g | Freq: Once | INTRAMUSCULAR | Status: AC
  Administered 2018-08-25: 1 g via INTRAMUSCULAR
  Filled 2018-08-24: qty 10

## 2018-08-24 NOTE — ED Triage Notes (Addendum)
Pt reports significant L lower leg swelling x 2-3 days. She states that it is sore and swollen. She is concerned about a DVT because she has had one in the past. A&Ox4. No chest pain or SOB. Pt is deaf and communicated through writing.

## 2018-08-25 ENCOUNTER — Ambulatory Visit (HOSPITAL_COMMUNITY): Admission: RE | Admit: 2018-08-25 | Payer: Medicare Other | Source: Ambulatory Visit

## 2018-08-25 DIAGNOSIS — M7989 Other specified soft tissue disorders: Secondary | ICD-10-CM | POA: Diagnosis not present

## 2018-08-25 LAB — I-STAT CHEM 8, ED
BUN: 18 mg/dL (ref 8–23)
Calcium, Ion: 1.19 mmol/L (ref 1.15–1.40)
Chloride: 105 mmol/L (ref 98–111)
Creatinine, Ser: 0.7 mg/dL (ref 0.44–1.00)
GLUCOSE: 86 mg/dL (ref 70–99)
HCT: 35 % — ABNORMAL LOW (ref 36.0–46.0)
HEMOGLOBIN: 11.9 g/dL — AB (ref 12.0–15.0)
POTASSIUM: 4.2 mmol/L (ref 3.5–5.1)
Sodium: 140 mmol/L (ref 135–145)
TCO2: 27 mmol/L (ref 22–32)

## 2018-08-25 MED ORDER — CEPHALEXIN 500 MG PO CAPS: 500.0000 mg | ORAL_CAPSULE | Freq: Four times a day (QID) | ORAL | 0 refills | Status: DC

## 2018-08-25 MED ORDER — RIVAROXABAN (XARELTO) VTE STARTER PACK (15 & 20 MG): ORAL_TABLET | ORAL | 0 refills | Status: DC

## 2018-08-25 MED ORDER — STERILE WATER FOR INJECTION IJ SOLN
INTRAMUSCULAR | Status: AC
  Administered 2018-08-25: 2.1 mL
  Filled 2018-08-25: qty 10

## 2018-08-25 NOTE — ED Notes (Signed)
Dr.Mesner made aware of pt blood pressure and advised that she should take her home medication once she gets home.

## 2018-08-25 NOTE — Discharge Instructions (Addendum)
You have significant amount of redness and swelling to your left leg.  I am unclear if this is a DVT as she has a history of it or if this is cellulitis. You need to go to [facility] tomorrow at 8:00 for an ultrasound.   I have given you prescription for both antibiotics and blood thinners.  If the ultrasound is negative please start the antibiotics.  If the ultrasound shows a blood clot then please start the Xarelto.  Neither way to you need to take both.   Either way you need to follow-up with your doctor 2 to 3 days for improving symptoms.

## 2018-08-25 NOTE — ED Provider Notes (Signed)
`  Emergency Department Provider Note   I have reviewed the triage vital signs and the nursing notes.   HISTORY  Chief Complaint Leg Swelling (L)   HPI Melissa Murphy is a 83 y.o. female who is deaf and I communicated via written words.  She states that she has had a few days of progressively worsening left lower extremity swelling and pain.  This is similar to previous episodes of DVT.  She is not on blood thinners at this time.  She has no chest pain, shortness of breath or syncope.  No other associated symptoms.  She has not seen or do anything for her symptoms at this time. No other associated or modifying symptoms.    Past Medical History:  Diagnosis Date  . Anemia   . Arthritis   . Cataract   . Clotting disorder ([place])   . Deaf    Congenital; Requires an interpreter  . DVT (deep venous thrombosis) (HCC)    in eye, and leg  . Edema   . GERD (gastroesophageal reflux disease)   . Glaucoma   . Hernia   . Hiatal hernia   . Hyperlipidemia   . Hypertension   . Osteoporosis   . Sleep apnea   . Thyroid disease   . Ulcer   . Urinary incontinence   . Uterine polyp     Patient Active Problem List   Diagnosis Date Noted  . Soft tissue injury of left lower leg 02/07/2018  . Deafness 04/03/2014  . Insomnia 04/01/2014  . Hiatal hernia 12/08/2013  . Obstructive sleep apnea 01/10/2013  . Uterine polyp   . Unspecified chronic bronchitis ([place]) 10/02/2012  . 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 Hypertensive urgency 04/30/2012  . Edema 09/18/2011  . Anemia 09/18/2011    Past Surgical History:  Procedure Laterality Date  . CATARACT EXTRACTION     both eyes  . DILATATION & CURRETTAGE/HYSTEROSCOPY WITH RESECTOCOPE N/A 10/11/2012   Procedure: DILATATION & CURETTAGE/HYSTEROSCOPY WITH RESECTOCOPE;  Surgeon: Allena Katz, MD;  Location: [facility];  Service: Gynecology;  Laterality: N/A;  pt is deaf; please contact daughter Heide Spark [phone]  . EYE SURGERY    .  goiter removed     age 45  . HERNIA REPAIR    . HIATAL HERNIA REPAIR N/A 12/08/2013   Procedure: LAPAROSCOPIC REPAIR OF HIATAL HERNIA  ;  Surgeon: [name], MD;  Location: [facility];  Service: General;  Laterality: N/A;  . REFRACTIVE SURGERY    . TONSILLECTOMY    . xanthoma tumor removed      Current Outpatient Rx  . Order #: [id number] Class: Historical Med  . Order #: [id number] Class: Print  . Order #: [id number] Class: Normal  . Order #: [id number] Class: Print  . Order #: [id number] Class: Normal  . Order #: [id number] Class: Normal  . Order #: [id number] Class: Normal  . Order #: [id number] Class: Print  . Order #: [id number] Class: Print  . Order #: [id number] Class: Historical Med  . Order #: [id number] Class: Normal    Allergies Avastin [bevacizumab]; Clindamycin/lincomycin; Novocain [procaine]; and Sulfa antibiotics  Family History  Problem Relation Age of Onset  . Cancer Mother 77       colon cancer  . Stroke Mother 14       cause of death  . Heart disease Mother   . Hypertension Mother   . Heart disease Father 72       AMI; cause of death  . Heart disease Brother  AMI x 2; tobacco abuse; CABG  . Hernia Brother   . Hyperlipidemia Brother   . Hypertension Brother   . Migraines Daughter   . Heart disease Paternal Grandmother   . Esophageal cancer Neg Hx   . Rectal cancer Neg Hx   . Stomach cancer Neg Hx     Social History Social History   Tobacco Use  . Smoking status: Never Smoker  . Smokeless tobacco: Never Used  Substance Use Topics  . Alcohol use: Yes    Alcohol/week: 0.0 standard drinks    Comment: occasionally  . Drug use: No    Review of Systems  All other systems negative except as documented in the HPI. All pertinent positives and negatives as reviewed in the HPI. ____________________________________________   PHYSICAL EXAM:  VITAL SIGNS: ED Triage Vitals  Enc Vitals Group     BP 08/24/18 2211 (!) 169/98     Pulse Rate 08/24/18 2211 (!)  54     Resp 08/24/18 2211 18     Temp 08/24/18 2211 98.3 F (36.8 C)     Temp Source 08/24/18 2211 Oral     SpO2 08/24/18 2211 92 %    Constitutional: Alert and oriented. Well appearing and in no acute distress. Eyes: Conjunctivae are normal. PERRL. EOMI. Head: Atraumatic. Nose: No congestion/rhinnorhea. Mouth/Throat: Mucous membranes are moist.  Oropharynx non-erythematous. Neck: No stridor.  No meningeal signs.   Cardiovascular: Normal rate, regular rhythm. Good peripheral circulation. Grossly normal heart sounds.   Respiratory: Normal respiratory effort.  No retractions. Lungs CTAB. Gastrointestinal: Soft and nontender. No distention.  Musculoskeletal: Significant swelling and tenderness and redness to left lower extremity.  Warm to touch. Neurologic:  Normal speech and language. No gross focal neurologic deficits are appreciated.  Skin:  Skin is warm, dry and intact. No rash noted.   ____________________________________________   LABS (all labs ordered are listed, but only abnormal results are displayed)  Labs Reviewed  I-STAT CHEM 8, ED - Abnormal; Notable for the following components:      Result Value   Hemoglobin 11.9 (*)    HCT 35.0 (*)    All other components within normal limits   ____________________________________________   INITIAL IMPRESSION / ASSESSMENT AND PLAN / ED COURSE  Treated with Lovenox and Rocephin.  Will return to [facility] tomorrow to get an ultrasound and depend on the results of the start antibiotics or blood thinners.  Low suspicion for pulmonary embolus at this time without chest pain or shortness of breath.  Primary care follow-up in 3 days no matter what the results are.  Pertinent labs & imaging results that were available during my care of the patient were reviewed by me and considered in my medical decision making (see chart for details).  ____________________________________________  FINAL CLINICAL IMPRESSION(S) / ED DIAGNOSES  Final  diagnoses:  Swollen leg     MEDICATIONS GIVEN DURING THIS VISIT:  Medications  enoxaparin (LOVENOX) injection 80 mg (80 mg Subcutaneous Given 08/25/18 0023)  cefTRIAXone (ROCEPHIN) injection 1 g (1 g Intramuscular Given 08/25/18 0024)  sterile water (preservative free) injection (2.1 mLs  Given 08/25/18 0024)     NEW OUTPATIENT MEDICATIONS STARTED DURING THIS VISIT:  New Prescriptions   CEPHALEXIN (KEFLEX) 500 MG CAPSULE    Take 1 capsule (500 mg total) by mouth 4 (four) times daily.   RIVAROXABAN 15 & 20 MG TBPK    Take as directed on package: Start with one 15mg  tablet by mouth twice a day with  food. On Day 22, switch to one 20mg  tablet once a day with food.    Note:  This note was prepared with assistance of Dragon voice recognition software. Occasional wrong-word or sound-a-like substitutions may have occurred due to the inherent limitations of voice recognition software.   Ananiah Maciolek, Corene Cornea, MD 08/25/18 [phone]

## 2018-08-28 ENCOUNTER — Encounter (HOSPITAL_COMMUNITY): Payer: Self-pay | Admitting: Emergency Medicine

## 2018-08-28 ENCOUNTER — Emergency Department (HOSPITAL_BASED_OUTPATIENT_CLINIC_OR_DEPARTMENT_OTHER): Payer: Medicare Other

## 2018-08-28 ENCOUNTER — Other Ambulatory Visit: Payer: Self-pay

## 2018-08-28 ENCOUNTER — Emergency Department (HOSPITAL_COMMUNITY)
Admission: EM | Admit: 2018-08-28 | Discharge: 2018-08-28 | Disposition: A | Discharge status: Home / Self Care | Payer: Medicare Other | Attending: Emergency Medicine | Admitting: Emergency Medicine

## 2018-08-28 DIAGNOSIS — I8290 Acute embolism and thrombosis of unspecified vein: Secondary | ICD-10-CM | POA: Diagnosis not present

## 2018-08-28 DIAGNOSIS — Z7982 Long term (current) use of aspirin: Secondary | ICD-10-CM | POA: Diagnosis not present

## 2018-08-28 DIAGNOSIS — I10 Essential (primary) hypertension: Secondary | ICD-10-CM | POA: Insufficient documentation

## 2018-08-28 DIAGNOSIS — I82402 Acute embolism and thrombosis of unspecified deep veins of left lower extremity: Secondary | ICD-10-CM | POA: Insufficient documentation

## 2018-08-28 DIAGNOSIS — M7989 Other specified soft tissue disorders: Secondary | ICD-10-CM

## 2018-08-28 DIAGNOSIS — R001 Bradycardia, unspecified: Secondary | ICD-10-CM | POA: Diagnosis not present

## 2018-08-28 DIAGNOSIS — R2242 Localized swelling, mass and lump, left lower limb: Secondary | ICD-10-CM | POA: Diagnosis present

## 2018-08-28 DIAGNOSIS — Z79899 Other long term (current) drug therapy: Secondary | ICD-10-CM | POA: Diagnosis not present

## 2018-08-28 MED ORDER — RIVAROXABAN 15 MG PO TABS
15.0000 mg | ORAL_TABLET | Freq: Once | ORAL | Status: AC
  Administered 2018-08-28: 15 mg via ORAL
  Filled 2018-08-28: qty 1

## 2018-08-28 NOTE — ED Notes (Signed)
Called Interpreter For Sign language

## 2018-08-28 NOTE — ED Notes (Signed)
Patient verbalizes understanding of discharge instructions. Opportunity for questioning and answers were provided. Armband removed by staff, pt discharged from ED.  

## 2018-08-28 NOTE — ED Notes (Signed)
Patient verbalizes understanding of discharge instructions. Opportunity for questioning and answers were provided. 

## 2018-08-28 NOTE — ED Triage Notes (Signed)
C/o L leg swelling and pain.  Seen at [facility] on 1/11 and advised to follow-up with her PCP next day.  Treated with Lovenox and Rocephin on 1/11.  Pt states she was unable to get an appt with PCP and waited to see if leg got better.  Reports pain is worse.  Denies SOB and chest pain.  Pain is deaf.  Communicated with pt by writing.  HR in the 40s on arrival.

## 2018-08-28 NOTE — Discharge Instructions (Addendum)
Information on my medicine - XARELTO (rivaroxaban)  This medication education was reviewed with me or my healthcare representative as part of my discharge preparation.    WHY WAS XARELTO PRESCRIBED FOR YOU? Xarelto was prescribed to treat blood clots that may have been found in the veins of your legs (deep vein thrombosis) or in your lungs (pulmonary embolism) and to reduce the risk of them occurring again.  WHAT DO YOU NEED TO KNOW ABOUT XARELTO? The starting dose is one 15 mg tablet taken TWICE daily with food for the FIRST 21 DAYS then on (enter date)  09/18/18  the dose is changed to one 20 mg tablet taken ONCE A DAY with your evening meal.  DO NOT stop taking Xarelto without talking to the health care provider who prescribed the medication.  Refill your prescription for 20 mg tablets before you run out.  After discharge, you should have regular check-up appointments with your healthcare provider that is prescribing your Xarelto.  In the future your dose may need to be changed if your kidney function changes by a significant amount.  WHAT DO YOU DO IF YOU MISS A DOSE? If you are taking Xarelto TWICE DAILY and you miss a dose, take it as soon as you remember. You may take two 15 mg tablets (total 30 mg) at the same time then resume your regularly scheduled 15 mg twice daily the next day.  If you are taking Xarelto ONCE DAILY and you miss a dose, take it as soon as you remember on the same day then continue your regularly scheduled once daily regimen the next day. Do not take two doses of Xarelto at the same time.   IMPORTANT SAFETY INFORMATION Xarelto is a blood thinner medicine that can cause bleeding. You should call your healthcare provider right away if you experience any of the following: Bleeding from an injury or your nose that does not stop. Unusual colored urine (red or dark brown) or unusual colored stools (red or black). Unusual bruising for unknown reasons. A serious  fall or if you hit your head (even if there is no bleeding).  Some medicines may interact with Xarelto and might increase your risk of bleeding while on Xarelto. To help avoid this, consult your healthcare provider or pharmacist prior to using any new prescription or non-prescription medications, including herbals, vitamins, non-steroidal anti-inflammatory drugs (NSAIDs) and supplements.  This website has more information on Xarelto: [email or web address]-benson.[email or web address]   Start taking the Xarelto prescription tomorrow morning.  Elevate your legs above your heart except when walking.  You can use some heat on the area behind your left knee to help decrease swelling and inflammation from the clot.  Previously, you were given a prescription for cephalexin to treat a possible leg infection.  It is not clear that there is infection there today.  You can choose to either take the antibiotic, or wait and see if the left leg develops more pain, redness and swelling which could be indicative of an infection.  Is important to follow-up with your PCP for checkup in 1 week, sooner if needed for problems.

## 2018-08-28 NOTE — Progress Notes (Signed)
*  Preliminary Results* Left lower extremity venous duplex completed. Left lower extremity is positive for acute deep vein thrombosis. Refer to "CV Proc" under chart review to view complete preliminary results.  08/28/2018 6:17 PM  Maudry Mayhew, MHA, RVT, RDCS, RDMS

## 2018-08-28 NOTE — ED Provider Notes (Addendum)
[place] EMERGENCY DEPARTMENT Provider Note   CSN: [id number] Arrival date & time: 08/28/18  1623     History   Chief Complaint Chief Complaint  Patient presents with  . Leg Swelling    HPI Melissa Murphy is a 83 y.o. female.  Interview with American sign language interpreter at patient's request.  HPI   She was seen 3 days ago at [facility] emergency department, for leg swelling, treated with a Lovenox injection, ceftriaxone, and discharged with prescriptions for cephalexin and Xarelto.  She was advised to follow-up next day for Doppler imaging of the left leg to evaluate for DVT.  Patient did not follow-up for Doppler imaging because she felt that she not have a blood clot.  She also did not start taking the medications which were prescribed.  Presents now for persistent left leg pain with swelling.  He is able to walk.  She denies fever, chills, cough, shortness of breath, chest pain, weakness or dizziness.  Prior DVT, remotely, not currently anticoagulated.  Is here with her son.  Past Medical History:  Diagnosis Date  . Anemia   . Arthritis   . Cataract   . Clotting disorder ([place])   . Deaf    Congenital; Requires an interpreter  . DVT (deep venous thrombosis) (HCC)    in eye, and leg  . Edema   . GERD (gastroesophageal reflux disease)   . Glaucoma   . Hernia   . Hiatal hernia   . Hyperlipidemia   . Hypertension   . Osteoporosis   . Sleep apnea   . Thyroid disease   . Ulcer   . Urinary incontinence   . Uterine polyp     Patient Active Problem List   Diagnosis Date Noted  . Soft tissue injury of left lower leg 02/07/2018  . Deafness 04/03/2014  . Insomnia 04/01/2014  . Hiatal hernia 12/08/2013  . Obstructive sleep apnea 01/10/2013  . Uterine polyp   . Unspecified chronic bronchitis ([place]) 10/02/2012  . 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 Hypertensive urgency 04/30/2012  . Edema 09/18/2011  . Anemia 09/18/2011    Past  Surgical History:  Procedure Laterality Date  . CATARACT EXTRACTION     both eyes  . DILATATION & CURRETTAGE/HYSTEROSCOPY WITH RESECTOCOPE N/A 10/11/2012   Procedure: DILATATION & CURETTAGE/HYSTEROSCOPY WITH RESECTOCOPE;  Surgeon: Allena Katz, MD;  Location: [facility];  Service: Gynecology;  Laterality: N/A;  pt is deaf; please contact daughter Heide Spark [phone]  . EYE SURGERY    . goiter removed     age 23  . HERNIA REPAIR    . HIATAL HERNIA REPAIR N/A 12/08/2013   Procedure: LAPAROSCOPIC REPAIR OF HIATAL HERNIA  ;  Surgeon: [name], MD;  Location: [facility];  Service: General;  Laterality: N/A;  . REFRACTIVE SURGERY    . TONSILLECTOMY    . xanthoma tumor removed       OB History    Gravida  4   Para  3   Term      Preterm      AB  1   Living  3     SAB  1   TAB      Ectopic      Multiple      Live Births               Home Medications    Prior to Admission medications   Medication Sig Start Date End  Date Taking? Authorizing Provider  aspirin 81 MG chewable tablet Chew 81 mg by mouth at bedtime.    Yes [provider]  cephALEXin (KEFLEX) 500 MG capsule Take 1 capsule (500 mg total) by mouth 4 (four) times daily. 08/25/18  Yes Mesner, Corene Cornea, MD  cloNIDine (CATAPRES) 0.1 MG tablet Take 1 tablet (0.1 mg total) by mouth 2 (two) times daily. 07/29/18  Yes Rutherford Guys, MD  furosemide (LASIX) 20 MG tablet Take 1 tablet (20 mg total) by mouth daily. 02/08/18  Yes Eugenie Filler, MD  latanoprost (XALATAN) 0.005 % ophthalmic solution Place 1 drop into both eyes at bedtime.  08/26/18  Yes [provider]  metoprolol tartrate (LOPRESSOR) 50 MG tablet Take 1 tablet (50 mg total) by mouth 2 (two) times daily. 06/20/18  Yes McVey, Gelene Mink, PA-C  potassium chloride (K-DUR) 10 MEQ tablet Take 1 tablet (10 mEq total) by mouth daily. 02/19/18  Yes Tereasa Coop, PA-C  ramipril (ALTACE) 10 MG capsule Take 1 capsule (10 mg total) by  mouth 2 (two) times daily. 10/15/17  Yes Tereasa Coop, PA-C  simvastatin (ZOCOR) 40 MG tablet Take 0.5 tablets (20 mg total) by mouth at bedtime. 11/12/17  Yes Tereasa Coop, PA-C  tolterodine (DETROL LA) 4 MG 24 hr capsule Take 4 mg by mouth daily.   Yes [provider]  zolpidem (AMBIEN) 10 MG tablet Take 0.5-1 tablets (5-10 mg total) by mouth at bedtime as needed for sleep. 07/15/18  Yes Rutherford Guys, MD  Rivaroxaban 15 & 20 MG TBPK Take as directed on package: Start with one 15mg  tablet by mouth twice a day with food. On Day 22, switch to one 20mg  tablet once a day with food. 08/25/18   Mesner, Corene Cornea, MD    Family History Family History  Problem Relation Age of Onset  . Cancer Mother 26       colon cancer  . Stroke Mother 32       cause of death  . Heart disease Mother   . Hypertension Mother   . Heart disease Father 41       AMI; cause of death  . Heart disease Brother        AMI x 2; tobacco abuse; CABG  . Hernia Brother   . Hyperlipidemia Brother   . Hypertension Brother   . Migraines Daughter   . Heart disease Paternal Grandmother   . Esophageal cancer Neg Hx   . Rectal cancer Neg Hx   . Stomach cancer Neg Hx     Social History Social History   Tobacco Use  . Smoking status: Never Smoker  . Smokeless tobacco: Never Used  Substance Use Topics  . Alcohol use: Yes    Alcohol/week: 0.0 standard drinks    Comment: occasionally  . Drug use: No     Allergies   Avastin [bevacizumab]; Clindamycin/lincomycin; Novocain [procaine]; and Sulfa antibiotics   Review of Systems Review of Systems  All other systems reviewed and are negative.    Physical Exam Updated Vital Signs BP (!) 192/67 (BP Location: Right Arm)   Pulse (!) 45   Temp 98.5 F (36.9 C) (Oral)   Resp 16   SpO2 95%   Physical Exam Vitals signs and nursing note reviewed.  Constitutional:      Appearance: Normal appearance. She is well-developed. She is obese. She is not  ill-appearing or diaphoretic.  HENT:     Head: Normocephalic and atraumatic.  Eyes:  Conjunctiva/sclera: Conjunctivae normal.     Pupils: Pupils are equal, round, and reactive to light.  Neck:     Musculoskeletal: Normal range of motion and neck supple.     Trachea: Phonation normal.  Cardiovascular:     Rate and Rhythm: Normal rate and regular rhythm.  Pulmonary:     Effort: Pulmonary effort is normal.     Breath sounds: Normal breath sounds. No stridor.  Chest:     Chest wall: No tenderness.  Abdominal:     Palpations: Abdomen is soft.  Musculoskeletal: Normal range of motion.     Comments: Moderate left lower leg swelling.  No redness, drainage or skin lesions of the left lower leg.  No left popliteal mass or tenderness.  Normal arms and right leg.  Skin:    General: Skin is warm and dry.  Neurological:     Mental Status: She is alert and oriented to person, place, and time.     Motor: No abnormal muscle tone.  Psychiatric:        Behavior: Behavior normal.        Thought Content: Thought content normal.        Judgment: Judgment normal.      ED Treatments / Results  Labs (all labs ordered are listed, but only abnormal results are displayed) Labs Reviewed - No data to display  EKG EKG Interpretation  Date/Time:  Wednesday August 28 2018 19:58:45 EST Ventricular Rate:  47 PR Interval:    QRS Duration: 90 QT Interval:  502 QTC Calculation: 444 R Axis:     Text Interpretation:  Sinus bradycardia Borderline prolonged PR interval Abnormal R-wave progression, early transition LVH by voltage Since last tracing rate slower Confirmed by Daleen Bo [phone]) on 08/28/2018 8:02:05 PM   Radiology Vas [place] Lower Extremity Venous (dvt) (only [facility])  Result Date: 08/28/2018  Lower Venous Study Indications: Swelling.  Limitations: Poor ultrasound/tissue interface and body habitus. Performing Technologist: Maudry Mayhew MHA, RDMS, RVT, RDCS  Examination Guidelines: A  complete evaluation includes B-mode imaging, spectral Doppler, color Doppler, and power Doppler as needed of all accessible portions of each vessel. Bilateral testing is considered an integral part of a complete examination. Limited examinations for reoccurring indications may be performed as noted.  Right Venous Findings: +---+---------------+---------+-----------+----------+-------+    CompressibilityPhasicitySpontaneityPropertiesSummary +---+---------------+---------+-----------+----------+-------+ CFVFull           Yes      Yes                          +---+---------------+---------+-----------+----------+-------+  Left Venous Findings: +---------+---------------+---------+-----------+----------+-------------------+          CompressibilityPhasicitySpontaneityPropertiesSummary             +---------+---------------+---------+-----------+----------+-------------------+ CFV      Full           Yes      Yes                                      +---------+---------------+---------+-----------+----------+-------------------+ SFJ      Full                                                             +---------+---------------+---------+-----------+----------+-------------------+  FV Prox  Full                                                             +---------+---------------+---------+-----------+----------+-------------------+ FV Mid   Full                                                             +---------+---------------+---------+-----------+----------+-------------------+ FV Distal                        Yes                  Patient unable to                                                         tolerate                                                                  compression                                                               maneuver             +---------+---------------+---------+-----------+----------+-------------------+ POP      None                    No                   Acute               +---------+---------------+---------+-----------+----------+-------------------+ PTV                                                   Not visualized      +---------+---------------+---------+-----------+----------+-------------------+ PERO                                                  Not visualized      +---------+---------------+---------+-----------+----------+-------------------+    Summary: Right: No evidence of common femoral vein obstruction. Left: Findings consistent with acute deep vein thrombosis involving the left popliteal vein. No cystic structure found in the popliteal fossa.  *See table(s) above for measurements and observations. Electronically signed by Curt Jews  MD on 08/28/2018 at 8:02:05 PM.    Final     Procedures Procedures (including critical care time)  Medications Ordered in ED Medications  Rivaroxaban (XARELTO) tablet 15 mg (15 mg Oral Given 08/28/18 1949)     Initial Impression / Assessment and Plan / ED Course  I have reviewed the triage vital signs and the nursing notes.  Pertinent labs & imaging results that were available during my care of the patient were reviewed by me and considered in my medical decision making (see chart for details).      Patient Vitals for the past 24 hrs:  BP Temp Temp src Pulse Resp SpO2  08/28/18 1640 (!) 192/67 98.5 F (36.9 C) Oral (!) 45 16 95 %    8:02 PM Reevaluation with update and discussion. After initial assessment and treatment, an updated evaluation reveals no change in clinical status.  Findings discussed with patient using American language interpreter.  All questions answered.Daleen Bo   Medical Decision Making: Left leg DVT, popliteal.  Distal swelling secondary to proximal DVT.  Doubt cellulitis.  She has previously received a  prescription for Xarelto. I have a low suspicion for cellulitis at this time.  CRITICAL CARE- no Performed by: Daleen Bo   Nursing Notes Reviewed/ Care Coordinated Applicable Imaging Reviewed Interpretation of Laboratory Data incorporated into ED treatment  The patient appears reasonably screened and/or stabilized for discharge and I doubt any other medical condition or other [facility] requiring further screening, evaluation, or treatment in the ED at this time prior to discharge.  Plan: Home Medications-continue usual; Home Treatments-ice, heat, elevation for left leg swelling; return here if the recommended treatment, does not improve the symptoms; Recommended follow up-ECP follow-up 1 week and as needed    Final Clinical Impressions(s) / ED Diagnoses   Final diagnoses:  Venous thromboembolism (VTE)  Left leg swelling    ED Discharge Orders    None       Daleen Bo, MD 08/28/18 Lurena Nida    Daleen Bo, MD 08/28/18 2002

## 2018-09-13 ENCOUNTER — Ambulatory Visit: Payer: Medicare Other | Admitting: Emergency Medicine

## 2018-09-16 ENCOUNTER — Other Ambulatory Visit: Payer: Self-pay

## 2018-09-16 ENCOUNTER — Ambulatory Visit (INDEPENDENT_AMBULATORY_CARE_PROVIDER_SITE_OTHER): Payer: Medicare Other | Admitting: Family Medicine

## 2018-09-16 ENCOUNTER — Encounter: Payer: Self-pay | Admitting: Family Medicine

## 2018-09-16 VITALS — BP 120/85 | HR 53 | Temp 98.0°F | Ht 64.0 in | Wt 190.0 lb

## 2018-09-16 DIAGNOSIS — E785 Hyperlipidemia, unspecified: Secondary | ICD-10-CM

## 2018-09-16 DIAGNOSIS — I82432 Acute embolism and thrombosis of left popliteal vein: Secondary | ICD-10-CM | POA: Diagnosis not present

## 2018-09-16 DIAGNOSIS — I10 Essential (primary) hypertension: Secondary | ICD-10-CM | POA: Diagnosis not present

## 2018-09-16 DIAGNOSIS — Z5181 Encounter for therapeutic drug level monitoring: Secondary | ICD-10-CM | POA: Diagnosis not present

## 2018-09-16 DIAGNOSIS — G4700 Insomnia, unspecified: Secondary | ICD-10-CM

## 2018-09-16 LAB — POCT CBC
Granulocyte percent: 62 %G (ref 37–80)
HCT, POC: 38.4 % (ref 29–41)
Hemoglobin: 13 g/dL (ref 11–14.6)
Lymph, poc: 1.9 (ref 0.6–3.4)
MCH, POC: 30.8 pg (ref 27–31.2)
MCHC: 33.7 g/dL (ref 31.8–35.4)
MCV: 91.3 fL (ref 76–111)
MID (cbc): 0.5 (ref 0–0.9)
MPV: 7.9 fL (ref 0–99.8)
POC Granulocyte: 3.8 (ref 2–6.9)
POC LYMPH PERCENT: 29.9 %L (ref 10–50)
POC MID %: 8.1 %M (ref 0–12)
Platelet Count, POC: 208 10*3/uL (ref 142–424)
RBC: 4.21 M/uL (ref 4.04–5.48)
RDW, POC: 13.7 %
WBC: 6.2 10*3/uL (ref 4.6–10.2)

## 2018-09-16 MED ORDER — METOPROLOL TARTRATE 50 MG PO TABS: 50.0000 mg | ORAL_TABLET | Freq: Two times a day (BID) | ORAL | 3 refills | Status: DC

## 2018-09-16 MED ORDER — ZOLPIDEM TARTRATE 10 MG PO TABS: 5.0000 mg | ORAL_TABLET | Freq: Every evening | ORAL | 2 refills | Status: DC | PRN

## 2018-09-16 MED ORDER — RAMIPRIL 10 MG PO CAPS: 10.0000 mg | ORAL_CAPSULE | Freq: Two times a day (BID) | ORAL | 3 refills | Status: DC

## 2018-09-16 MED ORDER — CLONIDINE HCL 0.1 MG PO TABS: 0.1000 mg | ORAL_TABLET | Freq: Two times a day (BID) | ORAL | 1 refills | Status: DC

## 2018-09-16 MED ORDER — TOLTERODINE TARTRATE ER 4 MG PO CP24: 4.0000 mg | ORAL_CAPSULE | Freq: Every day | ORAL | 1 refills | Status: DC

## 2018-09-16 MED ORDER — SIMVASTATIN 40 MG PO TABS: 20.0000 mg | ORAL_TABLET | Freq: Every day | ORAL | 3 refills | Status: DC

## 2018-09-16 MED ORDER — RIVAROXABAN 20 MG PO TABS: 20.0000 mg | ORAL_TABLET | Freq: Every day | ORAL | 1 refills | Status: DC

## 2018-09-16 MED ORDER — POTASSIUM CHLORIDE ER 10 MEQ PO TBCR: 10.0000 meq | EXTENDED_RELEASE_TABLET | Freq: Every day | ORAL | 1 refills | Status: DC

## 2018-09-16 NOTE — Progress Notes (Signed)
2/3/20204:50 PM  Melissa Murphy Nov 22, 1935, 83 y.o. female [id number]  Chief Complaint  Patient presents with  . Follow-up    for ed visit for clot in lower left leg, Having little pain today in that leg. Blood clot meds are causing bp to go down.   . Medication Refill    xarelto, potassium, zolpidem, clonidine, metoprolol    HPI:   Patient is a 83 y.o. female with past medical history significant for HTN, insomnia, deaf, OSA, HLP, urge incontinence, DVT LLE who presents today for ER followup  Seen in ER 08/24/2018 and again on 08/28/2018 Doppler shows an acute clot of left popliteal vein Started on [place] interpreter here Patient reports h/o DVT 15 years ago Left leg pain and swelling pain Denies any abnormal bleeding Has been drinking ensure Denies any dizziness, lightheadedness, chest pain, SOB, palpitations, nausea, vomiting, abd pain   Fall Risk  09/16/2018 07/29/2018 06/29/2018 06/20/2018 06/11/2018  Falls in the past year? 0 0 0 0 No  Number falls in past yr: 0 - - - -  Injury with Fall? 0 - - - -  Risk for fall due to : Impaired mobility;Orthopedic patient - - - -     Depression screen [facility] 2/9 09/16/2018 07/29/2018 06/29/2018  Decreased Interest 0 0 0  Down, Depressed, Hopeless 0 0 0  PHQ - 2 Score 0 0 0    Allergies  Allergen Reactions  . Avastin [Bevacizumab] Nausea And Vomiting  . Clindamycin/Lincomycin Itching  . Novocain [Procaine] Other (See Comments)    Seizures   . Sulfa Antibiotics Other (See Comments)    Causes fevers    Prior to Admission medications   Medication Sig Start Date End Date Taking? Authorizing Provider  aspirin 81 MG chewable tablet Chew 81 mg by mouth at bedtime.    Yes [provider]  cloNIDine (CATAPRES) 0.1 MG tablet Take 1 tablet (0.1 mg total) by mouth 2 (two) times daily. 07/29/18  Yes Rutherford Guys, MD  furosemide (LASIX) 20 MG tablet Take 1 tablet (20 mg total) by mouth daily. 02/08/18  Yes Eugenie Filler, MD  latanoprost (XALATAN) 0.005 % ophthalmic solution Place 1 drop into both eyes at bedtime.  08/26/18  Yes [provider]  metoprolol tartrate (LOPRESSOR) 50 MG tablet Take 1 tablet (50 mg total) by mouth 2 (two) times daily. 06/20/18  Yes McVey, Gelene Mink, PA-C  potassium chloride (K-DUR) 10 MEQ tablet TAKE 1 TABLET BY MOUTH EVERY DAY 08/30/18  Yes Sagardia, Ines Bloomer, MD  ramipril (ALTACE) 10 MG capsule Take 1 capsule (10 mg total) by mouth 2 (two) times daily. 10/15/17  Yes Tereasa Coop, PA-C  simvastatin (ZOCOR) 40 MG tablet Take 0.5 tablets (20 mg total) by mouth at bedtime. 11/12/17  Yes Tereasa Coop, PA-C  zolpidem (AMBIEN) 10 MG tablet Take 0.5-1 tablets (5-10 mg total) by mouth at bedtime as needed for sleep. 07/15/18  Yes Rutherford Guys, MD  cephALEXin (KEFLEX) 500 MG capsule Take 1 capsule (500 mg total) by mouth 4 (four) times daily. 08/25/18   Mesner, Corene Cornea, MD  Rivaroxaban 15 & 20 MG TBPK Take as directed on package: Start with one 15mg  tablet by mouth twice a day with food. On Day 22, switch to one 20mg  tablet once a day with food. 08/25/18   Mesner, Corene Cornea, MD  tolterodine (DETROL LA) 4 MG 24 hr capsule Take 4 mg by mouth daily.    [provider]    Past Medical History:  Diagnosis Date  . Anemia   . Arthritis   . Cataract   . Clotting disorder ([place])   . Deaf    Congenital; Requires an interpreter  . DVT (deep venous thrombosis) (HCC)    in eye, and leg  . Edema   . GERD (gastroesophageal reflux disease)   . Glaucoma   . Hernia   . Hiatal hernia   . Hyperlipidemia   . Hypertension   . Osteoporosis   . Sleep apnea   . Thyroid disease   . Ulcer   . Urinary incontinence   . Uterine polyp     Past Surgical History:  Procedure Laterality Date  . CATARACT EXTRACTION     both eyes  . DILATATION & CURRETTAGE/HYSTEROSCOPY WITH RESECTOCOPE N/A 10/11/2012   Procedure: DILATATION & CURETTAGE/HYSTEROSCOPY WITH RESECTOCOPE;  Surgeon:  Allena Katz, MD;  Location: [facility];  Service: Gynecology;  Laterality: N/A;  pt is deaf; please contact daughter Heide Spark [phone]  . EYE SURGERY    . goiter removed     age 45  . HERNIA REPAIR    . HIATAL HERNIA REPAIR N/A 12/08/2013   Procedure: LAPAROSCOPIC REPAIR OF HIATAL HERNIA  ;  Surgeon: [name], MD;  Location: [facility];  Service: General;  Laterality: N/A;  . REFRACTIVE SURGERY    . TONSILLECTOMY    . xanthoma tumor removed      Social History   Tobacco Use  . Smoking status: Never Smoker  . Smokeless tobacco: Never Used  Substance Use Topics  . Alcohol use: Yes    Alcohol/week: 0.0 standard drinks    Comment: occasionally    Family History  Problem Relation Age of Onset  . Cancer Mother 16       colon cancer  . Stroke Mother 49       cause of death  . Heart disease Mother   . Hypertension Mother   . Heart disease Father 32       AMI; cause of death  . Heart disease Brother        AMI x 2; tobacco abuse; CABG  . Hernia Brother   . Hyperlipidemia Brother   . Hypertension Brother   . Migraines Daughter   . Heart disease Paternal Grandmother   . Esophageal cancer Neg Hx   . Rectal cancer Neg Hx   . Stomach cancer Neg Hx     ROS Per hpi  OBJECTIVE:  Blood pressure 120/85, pulse (!) 53, temperature 98 F (36.7 C), height 5\' 4"  (1.626 m), weight 190 lb (86.2 kg), SpO2 93 %. Body mass index is 32.61 kg/m.   BP Readings from Last 3 Encounters:  09/16/18 (!) 103/56  08/28/18 (!) 179/70  08/25/18 (!) 215/71   Wt Readings from Last 3 Encounters:  09/16/18 190 lb (86.2 kg)  07/29/18 189 lb (85.7 kg)  07/08/18 192 lb (87.1 kg)   Lab Results  Component Value Date   CREATININE 0.70 08/25/2018   Lab Results  Component Value Date   WBC 6.1 06/17/2018   HGB 11.9 (L) 08/25/2018   HCT 35.0 (L) 08/25/2018   MCV 95.8 06/17/2018   PLT 175 06/17/2018    Physical Exam Vitals signs and nursing note reviewed.  Constitutional:       Appearance: She is well-developed.  HENT:     Head: Normocephalic and atraumatic.     Mouth/Throat:     Pharynx: No  oropharyngeal exudate.  Eyes:     General: No scleral icterus.    Conjunctiva/sclera: Conjunctivae normal.     Pupils: Pupils are equal, round, and reactive to light.  Neck:     Musculoskeletal: Neck supple.  Cardiovascular:     Rate and Rhythm: Normal rate and regular rhythm.     Heart sounds: Normal heart sounds. No murmur. No friction rub. No gallop.   Pulmonary:     Effort: Pulmonary effort is normal.     Breath sounds: Normal breath sounds. No wheezing or rales.  Musculoskeletal:     Right lower leg: Edema present.     Left lower leg: Edema present.  Skin:    General: Skin is warm and dry.  Neurological:     Mental Status: She is alert and oriented to person, place, and time.     Results for orders placed or performed in visit on 09/16/18 (from the past 24 hour(s))  POCT CBC     Status: None   Collection Time: 09/16/18  5:10 PM  Result Value Ref Range   WBC 6.2 4.6 - 10.2 K/uL   Lymph, poc 1.9 0.6 - 3.4   POC LYMPH PERCENT 29.9 10 - 50 %L   MID (cbc) 0.5 0 - 0.9   POC MID % 8.1 0 - 12 %M   POC Granulocyte 3.8 2 - 6.9   Granulocyte percent 62.0 37 - 80 %G   RBC 4.21 4.04 - 5.48 M/uL   Hemoglobin 13.0 11 - 14.6 g/dL   HCT, POC 38.4 29 - 41 %   MCV 91.3 76 - 111 fL   MCH, POC 30.8 27 - 31.2 pg   MCHC 33.7 31.8 - 35.4 g/dL   RDW, POC 13.7 %   Platelet Count, POC 208 142 - 424 K/uL   MPV 7.9 0 - 99.8 fL    ASSESSMENT and PLAN  1. Essential hypertension Controlled. Continue current regime.  - Care order/instruction: - metoprolol tartrate (LOPRESSOR) 50 MG tablet; Take 1 tablet (50 mg total) by mouth 2 (two) times daily. - ramipril (ALTACE) 10 MG capsule; Take 1 capsule (10 mg total) by mouth 2 (two) times daily.  2. Acute deep vein thrombosis (DVT) of popliteal vein of left lower extremity (HCC) On xarelto, tolerating well - Care  order/instruction:  3. Medication monitoring encounter - POCT CBC - Comprehensive metabolic panel  4. Dyslipidemia - simvastatin (ZOCOR) 40 MG tablet; Take 0.5 tablets (20 mg total) by mouth at bedtime.  5. Insomnia, unspecified type - zolpidem (AMBIEN) 10 MG tablet; Take 0.5-1 tablets (5-10 mg total) by mouth at bedtime as needed for sleep.  Other orders - rivaroxaban (XARELTO) 20 MG TABS tablet; Take 1 tablet (20 mg total) by mouth daily with supper. - cloNIDine (CATAPRES) 0.1 MG tablet; Take 1 tablet (0.1 mg total) by mouth 2 (two) times daily. - potassium chloride (K-DUR) 10 MEQ tablet; Take 1 tablet (10 mEq total) by mouth daily. - tolterodine (DETROL LA) 4 MG 24 hr capsule; Take 1 capsule (4 mg total) by mouth daily.  Return in about 2 months (around 11/15/2018).    Rutherford Guys, MD Primary [facility] at [address], [place] [address] Ph.  [phone] Fax [phone]

## 2018-09-16 NOTE — Patient Instructions (Signed)
° ° ° °  If you have lab work done today you will be contacted with your lab results within the next 2 weeks.  If you have not heard from us then please contact us. The fastest way to get your results is to register for My Chart. ° ° °IF you received an x-ray today, you will receive an invoice from [facility]. Please contact  Radiology at [phone] with questions or concerns regarding your invoice.  ° °IF you received labwork today, you will receive an invoice from [facility]. Please contact [facility] at [phone] with questions or concerns regarding your invoice.  ° °Our billing staff will not be able to assist you with questions regarding bills from these companies. ° °You will be contacted with the lab results as soon as they are available. The fastest way to get your results is to activate your My Chart account. Instructions are located on the last page of this paperwork. If you have not heard from us regarding the results in 2 weeks, please contact this office. °  ° ° ° °

## 2018-09-17 ENCOUNTER — Ambulatory Visit: Payer: Medicare Other | Admitting: Gastroenterology

## 2018-09-17 DIAGNOSIS — L570 Actinic keratosis: Secondary | ICD-10-CM | POA: Diagnosis not present

## 2018-09-17 DIAGNOSIS — L820 Inflamed seborrheic keratosis: Secondary | ICD-10-CM | POA: Diagnosis not present

## 2018-09-17 LAB — COMPREHENSIVE METABOLIC PANEL
ALT: 15 IU/L (ref 0–32)
AST: 27 IU/L (ref 0–40)
Albumin/Globulin Ratio: 1.7 (ref 1.2–2.2)
Albumin: 4.2 g/dL (ref 3.7–4.7)
Alkaline Phosphatase: 69 IU/L (ref 39–117)
BUN/Creatinine Ratio: 37 — ABNORMAL HIGH (ref 12–28)
BUN: 30 mg/dL — ABNORMAL HIGH (ref 8–27)
Bilirubin Total: 0.3 mg/dL (ref 0.0–1.2)
CO2: 20 mmol/L (ref 20–29)
Calcium: 9.6 mg/dL (ref 8.7–10.3)
Chloride: 100 mmol/L (ref 96–106)
Creatinine, Ser: 0.82 mg/dL (ref 0.57–1.00)
GFR calc Af Amer: 78 mL/min/{1.73_m2} (ref >59)
GFR calc non Af Amer: 68 mL/min/{1.73_m2} (ref >59)
Globulin, Total: 2.5 g/dL (ref 1.5–4.5)
Glucose: 97 mg/dL (ref 65–99)
Potassium: 4.5 mmol/L (ref 3.5–5.2)
Sodium: 139 mmol/L (ref 134–144)
Total Protein: 6.7 g/dL (ref 6.0–8.5)

## 2018-09-24 ENCOUNTER — Ambulatory Visit: Payer: Medicare Other | Admitting: Emergency Medicine

## 2018-09-24 ENCOUNTER — Ambulatory Visit: Payer: Medicare Other | Admitting: Family Medicine

## 2018-09-24 DIAGNOSIS — H40053 Ocular hypertension, bilateral: Secondary | ICD-10-CM | POA: Diagnosis not present

## 2018-09-27 ENCOUNTER — Ambulatory Visit (INDEPENDENT_AMBULATORY_CARE_PROVIDER_SITE_OTHER): Payer: Medicare Other

## 2018-09-27 ENCOUNTER — Encounter: Payer: Self-pay | Admitting: Family Medicine

## 2018-09-27 ENCOUNTER — Ambulatory Visit (INDEPENDENT_AMBULATORY_CARE_PROVIDER_SITE_OTHER): Payer: Medicare Other | Admitting: Family Medicine

## 2018-09-27 ENCOUNTER — Other Ambulatory Visit: Payer: Self-pay

## 2018-09-27 VITALS — BP 132/83 | HR 56 | Temp 98.5°F | Ht 64.0 in | Wt 195.4 lb

## 2018-09-27 DIAGNOSIS — M25561 Pain in right knee: Secondary | ICD-10-CM

## 2018-09-27 MED ORDER — HYDROCODONE-ACETAMINOPHEN 5-325 MG PO TABS: 1.0000 | ORAL_TABLET | Freq: Four times a day (QID) | ORAL | 0 refills | Status: DC | PRN

## 2018-09-27 NOTE — Progress Notes (Signed)
Subjective:    Patient ID: Melissa Murphy, female    DOB: July 20, 1936, 83 y.o.   MRN: [id number]  HPI Melissa Murphy is a 83 y.o. female Presents today for: Chief Complaint  Patient presents with  . right knee pain    2 weeks (left one starting to hurt as well. Right knee worse)   Presents with right greater than left knee pain over the past 2 weeks.  Primarily right knee pain. On chart review she did have an x-ray of her right knee in December 2015, and at that time no fracture dislocation, no foreign bodies, no joint effusion and moderate tricompartmental degenerative changes.  Current R knee pain past 2-3 weeks.  Was advised to elevate feet for a few days after diagnosed with DVT in mid January. Now on Xarelto. Seen by Dr. Pamella Pert on 2/3.   Knee pain has started since that visit. Cramps in leg have improved, but still sore in knee. NKI. Feel like knee pain after keeping legs raised more. Some soreness at night d/t knee.  Prior knee procedure - injection few years ago.    Tx: tylenol once - no relief. Lidocaine topical cream.    Patient Active Problem List   Diagnosis Date Noted  . Soft tissue injury of left lower leg 02/07/2018  . Deafness 04/03/2014  . Insomnia 04/01/2014  . Hiatal hernia 12/08/2013  . Obstructive sleep apnea 01/10/2013  . Uterine polyp   . Unspecified chronic bronchitis ([place]) 10/02/2012  . 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 Hypertensive urgency 04/30/2012  . Edema 09/18/2011  . Anemia 09/18/2011   Past Medical History:  Diagnosis Date  . Anemia   . Arthritis   . Cataract   . Clotting disorder ([place])   . Deaf    Congenital; Requires an interpreter  . DVT (deep venous thrombosis) ([facility])    in eye, and leg  . Edema   . GERD (gastroesophageal reflux disease)   . Glaucoma   . Hernia   . Hiatal hernia   . Hyperlipidemia   . Hypertension   . Osteoporosis   . Sleep apnea   . Thyroid disease   . Ulcer   . Urinary incontinence   .  Uterine polyp    Past Surgical History:  Procedure Laterality Date  . CATARACT EXTRACTION     both eyes  . DILATATION & CURRETTAGE/HYSTEROSCOPY WITH RESECTOCOPE N/A 10/11/2012   Procedure: DILATATION & CURETTAGE/HYSTEROSCOPY WITH RESECTOCOPE;  Surgeon: Allena Katz, MD;  Location: [facility];  Service: Gynecology;  Laterality: N/A;  pt is deaf; please contact daughter Heide Spark [phone]  . EYE SURGERY    . goiter removed     age 79  . HERNIA REPAIR    . HIATAL HERNIA REPAIR N/A 12/08/2013   Procedure: LAPAROSCOPIC REPAIR OF HIATAL HERNIA  ;  Surgeon: [name], MD;  Location: [facility];  Service: General;  Laterality: N/A;  . REFRACTIVE SURGERY    . TONSILLECTOMY    . xanthoma tumor removed     Allergies  Allergen Reactions  . Avastin [Bevacizumab] Nausea And Vomiting  . Clindamycin/Lincomycin Itching  . Novocain [Procaine] Other (See Comments)    Seizures   . Sulfa Antibiotics Other (See Comments)    Causes fevers   Prior to Admission medications   Medication Sig Start Date End Date Taking? Authorizing Provider  cloNIDine (CATAPRES) 0.1 MG tablet Take 1 tablet (0.1 mg total) by mouth 2 (two) times daily. 09/16/18  Yes  Rutherford Guys, MD  furosemide (LASIX) 20 MG tablet Take 1 tablet (20 mg total) by mouth daily. 02/08/18  Yes Eugenie Filler, MD  latanoprost (XALATAN) 0.005 % ophthalmic solution Place 1 drop into both eyes at bedtime.  08/26/18  Yes [provider]  metoprolol tartrate (LOPRESSOR) 50 MG tablet Take 1 tablet (50 mg total) by mouth 2 (two) times daily. 09/16/18  Yes Rutherford Guys, MD  potassium chloride (K-DUR) 10 MEQ tablet Take 1 tablet (10 mEq total) by mouth daily. 09/16/18  Yes Rutherford Guys, MD  ramipril (ALTACE) 10 MG capsule Take 1 capsule (10 mg total) by mouth 2 (two) times daily. 09/16/18  Yes Rutherford Guys, MD  rivaroxaban (XARELTO) 20 MG TABS tablet Take 1 tablet (20 mg total) by mouth daily with supper. 09/16/18  Yes Rutherford Guys, MD  simvastatin (ZOCOR) 40 MG tablet Take 0.5 tablets (20 mg total) by mouth at bedtime. 09/16/18  Yes Rutherford Guys, MD  tolterodine (DETROL LA) 4 MG 24 hr capsule Take 1 capsule (4 mg total) by mouth daily. 09/16/18  Yes Rutherford Guys, MD  zolpidem (AMBIEN) 10 MG tablet Take 0.5-1 tablets (5-10 mg total) by mouth at bedtime as needed for sleep. 09/16/18  Yes Rutherford Guys, MD  aspirin 81 MG chewable tablet Chew 81 mg by mouth at bedtime.     [provider]   Social History   Socioeconomic History  . Marital status: Married    Spouse name: Not on file  . Number of children: 3  . Years of education: Grad   . Highest education level: Not on file  Occupational History  . 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 Financial resource strain: Not on file  . Food insecurity:    Worry: Not on file    Inability: Not on file  . Transportation needs:    Medical: Not on file    Non-medical: Not on file  Tobacco Use  . Smoking status: Never Smoker  . Smokeless tobacco: Never Used  Substance and Sexual Activity  . Alcohol use: Yes    Alcohol/week: 0.0 standard drinks    Comment: occasionally  . Drug use: No  . Sexual activity: Not Currently  Lifestyle  . Physical activity:    Days per week: Not on file    Minutes per session: Not on file  . Stress: Not on file  Relationships  . Social connections:    Talks on phone: Not on file    Gets together: Not on file    Attends religious service: Not on file    Active member of club or organization: Not on file    Attends meetings of clubs or organizations: Not on file    Relationship status: Not on file  . Intimate partner violence:    Fear of current or ex partner: Not on file    Emotionally abused: Not on file    Physically abused: Not on file    Forced sexual activity: Not on file  Other Topics Concern  . Not on file  Social History Narrative   Marital status: married x 1999; second marriage; first husband died  from AMI in 59;  husband is deaf as well.  Moderately happy; no abuse.      Children: 3 children; 2 grandchildren.  2 gg      Lives: with husband and daughter.  Moved from [place]; still has home in [place] but plans to  remain in [place].      Employment: retired; Hospital doctor.      Tobacco:  Never      Alcohol: none      Exercising:  Walking and cleaning the house.      ADLs:  Uses cane for ambulation; has walker; no cooking; cleans house on own; pays own bills; goes to grocery store with husband. Performs self hygiene.        Advanced Directives:  None.  FULL CODE desired; no prolonged measures.    Review of Systems Per HPi.     Objective:   Physical Exam Constitutional:      General: She is not in acute distress.    Appearance: She is well-developed.  HENT:     Head: Normocephalic and atraumatic.  Cardiovascular:     Rate and Rhythm: Normal rate.  Pulmonary:     Effort: Pulmonary effort is normal.  Musculoskeletal:     Right knee: She exhibits decreased range of motion (flex80-90, lacks approx 10 degrees flex. ) and bony tenderness. She exhibits no deformity and no erythema. Tenderness (guarded exam, but ttp med and lateral. no apprent significant effusion. ) found. Medial joint line and lateral joint line tenderness noted.  Neurological:     Mental Status: She is alert and oriented to person, place, and time.    Vitals:   09/27/18 1444  BP: 132/83  Pulse: (!) 56  Temp: 98.5 F (36.9 C)  TempSrc: Oral  SpO2: 94%  Weight: 195 lb 6.4 oz (88.6 kg)  Height: 5\' 4"  (1.626 m)     Dg Knee Complete 4 Views Right  Result Date: 09/27/2018 CLINICAL DATA:  Right knee pain for less than 2 weeks. No known injury. EXAM: RIGHT KNEE - COMPLETE 4+ VIEW COMPARISON:  Radiographs 08/03/2014 and 01/13/2013. FINDINGS: The bones appear mildly demineralized. There is no evidence of acute fracture or dislocation. There are mildly progressive tricompartmental degenerative changes,  especially in the lateral compartment where there is bone-on-bone apposition and prominent peripheral osteophyte formation. No significant joint effusion. Mild popliteal atherosclerosis noted. IMPRESSION: No acute findings. Mildly progressive osteoarthritis, greatest in the lateral compartment Electronically Signed   By: Richardean Sale M.D.   On: 09/27/2018 16:15       Assessment & Plan:    Melissa Murphy is a 83 y.o. female Acute pain of right knee - Plan: DG Knee Complete 4 Views Right, HYDROcodone-acetaminophen (NORCO/VICODIN) 5-325 MG tablet, Ambulatory referral to Orthopedic Surgery  - progressive DJD on XR. Possible flair of OA from change in positioning after dx of DVT.    - decided against offloader brace as bilat pain.   - refer to ortho to decide on injection, but Rx hydrocodone provided (risks/side effects discussed) if needed for more pain.  Tylenol for milder pain. Rtc precautions if worsening.   Sing language interpreter present.   Meds ordered this encounter  Medications  . HYDROcodone-acetaminophen (NORCO/VICODIN) 5-325 MG tablet    Sig: Take 1 tablet by mouth every 6 (six) hours as needed for moderate pain.    Dispense:  15 tablet    Refill:  0   Patient Instructions   Okay to use a walker as needed to help with ambulation, gentle range of motion as tolerated throughout the day to keep it from getting stiff.  If there are any concerns on x-ray I will let you know but I suspect your pain is related to flare of arthritis.   Tylenol or lidocaine cream for  milder pain, but hydrocodone if needed for more severe pain. I will refer you to ortho.   Return to the clinic or go to the nearest emergency room if any of your symptoms worsen or new symptoms occur.    Acute Knee Pain, Adult Acute knee pain is sudden and may be caused by damage, swelling, or irritation of the muscles and tissues that support your knee. The injury may result from:  A fall.  An injury to your  knee from twisting motions.  A hit to the knee.  Infection. Acute knee pain may go away on its own with time and rest. If it does not, your health care provider may order tests to find the cause of the pain. These may include:  Imaging tests, such as an X-ray, MRI, or ultrasound.  Joint aspiration. In this test, fluid is removed from the knee.  Arthroscopy. In this test, a lighted tube is inserted into the knee and an image is projected onto a TV screen.  Biopsy. In this test, a sample of tissue is removed from the body and studied under a microscope. Follow these instructions at home: Pay attention to any changes in your symptoms. Take these actions to relieve your pain. If you have a knee sleeve or brace:   Wear the sleeve or brace as told by your health care provider. Remove it only as told by your health care provider.  Loosen the sleeve or brace if your toes tingle, become numb, or turn cold and blue.  Keep the sleeve or brace clean.  If the sleeve or brace is not waterproof: ? Do not let it get wet. ? Cover it with a watertight covering when you take a bath or shower. Activity  Rest your knee.  Do not do things that cause pain or make pain worse.  Avoid high-impact activities or exercises, such as running, jumping rope, or doing jumping jacks.  Work with a physical therapist to make a safe exercise program, as recommended by your health care provider. Do exercises as told by your physical therapist. Managing pain, stiffness, and swelling   If directed, put ice on the knee: ? Put ice in a plastic bag. ? Place a towel between your skin and the bag. ? Leave the ice on for 20 minutes, 2-3 times a day.  If directed, use an elastic bandage to put pressure (compression) on your injured knee. This may control swelling, give support, and help with discomfort. General instructions  Take over-the-counter and prescription medicines only as told by your health care  provider.  Raise (elevate) your knee above the level of your heart when you are sitting or lying down.  Sleep with a pillow under your knee.  Do not use any products that contain nicotine or tobacco, such as cigarettes, e-cigarettes, and chewing tobacco. These can delay healing. If you need help quitting, ask your health care provider.  If you are overweight, work with your health care provider and a dietitian to set a weight-loss goal that is healthy and reasonable for you. Extra weight can put pressure on your knee.  Keep all follow-up visits as told by your health care provider. This is important. Contact a health care provider if:  Your knee pain continues, changes, or gets worse.  You have a fever along with knee pain.  Your knee feels warm to the touch.  Your knee buckles or locks up. Get help right away if:  Your knee swells, and the swelling  becomes worse.  You cannot move your knee.  You have severe pain in your knee. Summary  Acute knee pain can be caused by a fall, an injury, an infection, or damage, swelling, or irritation of the tissues that support your knee.  Your health care provider may perform tests to find out the cause of the pain.  Pay attention to any changes in your symptoms. Relieve your pain with rest, medicines, light activity, and use of ice.  Get help if your pain continues or becomes worse, your knee swells, or you cannot move your knee. This information is not intended to replace advice given to you by your health care provider. Make sure you discuss any questions you have with your health care provider. Document Released: 05/28/2007 Document Revised: 01/10/2018 Document Reviewed: 01/10/2018 Elsevier Interactive Patient Education  [employer].    If you have lab work done today you will be contacted with your lab results within the next 2 weeks.  If you have not heard from [place] then please contact us. The fastest way to get your results is  to register for My Chart.   IF you received an x-ray today, you will receive an invoice from [facility]. Please contact [facility] at [phone] with questions or concerns regarding your invoice.   IF you received labwork today, you will receive an invoice from [facility]. Please contact [facility] at [phone] with questions or concerns regarding your invoice.   Our billing staff will not be able to assist you with questions regarding bills from these companies.  You will be contacted with the lab results as soon as they are available. The fastest way to get your results is to activate your My Chart account. Instructions are located on the last page of this paperwork. If you have not heard from [place] regarding the results in 2 weeks, please contact this office.       Signed,   Merri Ray, MD [facility] [place].  09/28/18 1:19 PM

## 2018-09-27 NOTE — Patient Instructions (Addendum)
Okay to use a walker as needed to help with ambulation, gentle range of motion as tolerated throughout the day to keep it from getting stiff.  If there are any concerns on x-ray I will let you know but I suspect your pain is related to flare of arthritis.   Tylenol or lidocaine cream for milder pain, but hydrocodone if needed for more severe pain. I will refer you to ortho.   Return to the clinic or go to the nearest emergency room if any of your symptoms worsen or new symptoms occur.    Acute Knee Pain, Adult Acute knee pain is sudden and may be caused by damage, swelling, or irritation of the muscles and tissues that support your knee. The injury may result from:  A fall.  An injury to your knee from twisting motions.  A hit to the knee.  Infection. Acute knee pain may go away on its own with time and rest. If it does not, your health care provider may order tests to find the cause of the pain. These may include:  Imaging tests, such as an X-ray, MRI, or ultrasound.  Joint aspiration. In this test, fluid is removed from the knee.  Arthroscopy. In this test, a lighted tube is inserted into the knee and an image is projected onto a TV screen.  Biopsy. In this test, a sample of tissue is removed from the body and studied under a microscope. Follow these instructions at home: Pay attention to any changes in your symptoms. Take these actions to relieve your pain. If you have a knee sleeve or brace:   Wear the sleeve or brace as told by your health care provider. Remove it only as told by your health care provider.  Loosen the sleeve or brace if your toes tingle, become numb, or turn cold and blue.  Keep the sleeve or brace clean.  If the sleeve or brace is not waterproof: ? Do not let it get wet. ? Cover it with a watertight covering when you take a bath or shower. Activity  Rest your knee.  Do not do things that cause pain or make pain worse.  Avoid high-impact activities  or exercises, such as running, jumping rope, or doing jumping jacks.  Work with a physical therapist to make a safe exercise program, as recommended by your health care provider. Do exercises as told by your physical therapist. Managing pain, stiffness, and swelling   If directed, put ice on the knee: ? Put ice in a plastic bag. ? Place a towel between your skin and the bag. ? Leave the ice on for 20 minutes, 2-3 times a day.  If directed, use an elastic bandage to put pressure (compression) on your injured knee. This may control swelling, give support, and help with discomfort. General instructions  Take over-the-counter and prescription medicines only as told by your health care provider.  Raise (elevate) your knee above the level of your heart when you are sitting or lying down.  Sleep with a pillow under your knee.  Do not use any products that contain nicotine or tobacco, such as cigarettes, e-cigarettes, and chewing tobacco. These can delay healing. If you need help quitting, ask your health care provider.  If you are overweight, work with your health care provider and a dietitian to set a weight-loss goal that is healthy and reasonable for you. Extra weight can put pressure on your knee.  Keep all follow-up visits as told by your health  care provider. This is important. Contact a health care provider if:  Your knee pain continues, changes, or gets worse.  You have a fever along with knee pain.  Your knee feels warm to the touch.  Your knee buckles or locks up. Get help right away if:  Your knee swells, and the swelling becomes worse.  You cannot move your knee.  You have severe pain in your knee. Summary  Acute knee pain can be caused by a fall, an injury, an infection, or damage, swelling, or irritation of the tissues that support your knee.  Your health care provider may perform tests to find out the cause of the pain.  Pay attention to any changes in your  symptoms. Relieve your pain with rest, medicines, light activity, and use of ice.  Get help if your pain continues or becomes worse, your knee swells, or you cannot move your knee. This information is not intended to replace advice given to you by your health care provider. Make sure you discuss any questions you have with your health care provider. Document Released: 05/28/2007 Document Revised: 01/10/2018 Document Reviewed: 01/10/2018 Elsevier Interactive Patient Education  [employer].    If you have lab work done today you will be contacted with your lab results within the next 2 weeks.  If you have not heard from [place] then please contact us. The fastest way to get your results is to register for My Chart.   IF you received an x-ray today, you will receive an invoice from [facility]. Please contact [facility] at [phone] with questions or concerns regarding your invoice.   IF you received labwork today, you will receive an invoice from [facility]. Please contact [facility] at [phone] with questions or concerns regarding your invoice.   Our billing staff will not be able to assist you with questions regarding bills from these companies.  You will be contacted with the lab results as soon as they are available. The fastest way to get your results is to activate your My Chart account. Instructions are located on the last page of this paperwork. If you have not heard from [place] regarding the results in 2 weeks, please contact this office.

## 2018-09-28 ENCOUNTER — Encounter: Payer: Self-pay | Admitting: Family Medicine

## 2018-09-30 ENCOUNTER — Telehealth: Payer: Self-pay | Admitting: Family Medicine

## 2018-10-11 ENCOUNTER — Other Ambulatory Visit: Payer: Self-pay | Admitting: Family Medicine

## 2018-10-16 DIAGNOSIS — M1711 Unilateral primary osteoarthritis, right knee: Secondary | ICD-10-CM | POA: Diagnosis not present

## 2018-10-16 DIAGNOSIS — M25561 Pain in right knee: Secondary | ICD-10-CM | POA: Diagnosis not present

## 2018-10-25 ENCOUNTER — Other Ambulatory Visit: Payer: Self-pay | Admitting: Family Medicine

## 2018-10-25 NOTE — Addendum Note (Signed)
Addended by: Dimple Nanas on: 10/25/2018 06:03 PM   Modules accepted: Orders

## 2018-10-25 NOTE — Telephone Encounter (Signed)
Copied from [place] [id number]. Topic: Quick Communication - Rx Refill/Question >> Oct 25, 2018  4:22 PM Gustavus Messing wrote: Medication: cloNIDine (CATAPRES) 0.1 MG tablet   Has the patient contacted their pharmacy? Yes.   (Agent: If yes, when and what did the pharmacy advise?) Patient was told by pharmacy to stay inside because of age so she needs enough to not run out. She only has a few pills left.  Preferred Pharmacy (with phone number or street name): [id number] [address], [place] [phone] (Phone) [phone] (Fax)    Agent: Please be advised that RX refills may take up to 3 business days. We ask that you follow-up with your pharmacy.

## 2018-10-25 NOTE — Telephone Encounter (Signed)
Requested medication at pharmacy with refills available

## 2018-10-28 ENCOUNTER — Ambulatory Visit: Payer: Medicare Other | Admitting: Family Medicine

## 2018-10-30 ENCOUNTER — Telehealth: Payer: Self-pay | Admitting: Family Medicine

## 2018-10-30 NOTE — Telephone Encounter (Signed)
Called pt to let her know that she has a follow . She stated that she had received 2 phone calls today and had no idea why. There is no documentation that any one els had called her today FR

## 2018-11-06 DIAGNOSIS — N3946 Mixed incontinence: Secondary | ICD-10-CM | POA: Diagnosis not present

## 2018-11-06 DIAGNOSIS — R350 Frequency of micturition: Secondary | ICD-10-CM | POA: Diagnosis not present

## 2018-11-08 ENCOUNTER — Ambulatory Visit: Payer: Medicare Other | Admitting: Family Medicine

## 2018-11-12 ENCOUNTER — Telehealth: Payer: Self-pay | Admitting: Family Medicine

## 2018-11-12 ENCOUNTER — Telehealth: Payer: Medicare Other | Admitting: Family Medicine

## 2018-11-12 ENCOUNTER — Ambulatory Visit: Payer: Medicare Other | Admitting: Family Medicine

## 2018-11-12 ENCOUNTER — Other Ambulatory Visit: Payer: Self-pay

## 2018-11-12 NOTE — Telephone Encounter (Signed)
Pt called in to be advised. Pt would like to know how long is she suppose to take this medication? Pt says if she doesn't answer to please leave vm.

## 2018-11-12 NOTE — Telephone Encounter (Signed)
Please advise on refill request

## 2018-11-13 NOTE — Telephone Encounter (Signed)
I talked with Pt's daughter Hughes Better and stated for her mother to use Tylenol or lidocaine cream for milder pain, but hydrocodone if needed for more severe pain. And a refer was sent to ortho.

## 2018-11-15 ENCOUNTER — Ambulatory Visit: Payer: Medicare Other | Admitting: Family Medicine

## 2018-11-19 ENCOUNTER — Other Ambulatory Visit: Payer: Self-pay | Admitting: Physician Assistant

## 2018-11-19 DIAGNOSIS — R0989 Other specified symptoms and signs involving the circulatory and respiratory systems: Secondary | ICD-10-CM

## 2018-11-20 ENCOUNTER — Other Ambulatory Visit: Payer: Self-pay | Admitting: Family Medicine

## 2018-11-20 DIAGNOSIS — G4700 Insomnia, unspecified: Secondary | ICD-10-CM

## 2018-11-20 NOTE — Telephone Encounter (Signed)
Requested medication (s) are due for refill today: yes  Requested medication (s) are on the active medication list: yes  Last refill:  09/16/18  Future visit scheduled: no  Notes to clinic:  Medication not delegated to NT to refill   Requested Prescriptions  Pending Prescriptions Disp Refills   zolpidem (AMBIEN) 10 MG tablet 30 tablet 2    Sig: Take 0.5-1 tablets (5-10 mg total) by mouth at bedtime as needed for sleep.     Not Delegated - Psychiatry:  Anxiolytics/Hypnotics Failed - 11/20/2018  5:14 PM      Failed - This refill cannot be delegated      Failed - Urine Drug Screen completed in last 360 days.      Passed - Valid encounter within last 6 months    Recent Outpatient Visits          1 month ago Acute pain of right knee   [facility], Ranell Patrick, MD   2 months ago Essential hypertension   [facility], Lilia Argue, MD   3 months ago Essential hypertension   [facility], Lilia Argue, MD   4 months ago Essential hypertension   [facility], Lilia Argue, MD   5 months ago Essential hypertension   [facility], Gelene Mink, [place]

## 2018-11-21 MED ORDER — ZOLPIDEM TARTRATE 10 MG PO TABS: 5.0000 mg | ORAL_TABLET | Freq: Every evening | ORAL | 2 refills | Status: DC | PRN

## 2018-11-21 NOTE — Telephone Encounter (Signed)
pmp reviewd, appropriate meds refilled 

## 2018-12-11 ENCOUNTER — Other Ambulatory Visit: Payer: Self-pay | Admitting: Family Medicine

## 2018-12-11 NOTE — Telephone Encounter (Signed)
Requested Prescriptions  Pending Prescriptions Disp Refills  . XARELTO 20 MG TABS tablet [Pharmacy Med Name: XARELTO 20 MG TABLET] 90 tablet 3    Sig: TAKE 1 TABLET (20 MG TOTAL) BY MOUTH DAILY WITH SUPPER.     Hematology: Anticoagulants - rivaroxaban Passed - 12/11/2018  9:30 AM      Passed - ALT in normal range and within 180 days    ALT  Date Value Ref Range Status  09/16/2018 15 0 - 32 IU/L Final         Passed - AST in normal range and within 180 days    AST  Date Value Ref Range Status  09/16/2018 27 0 - 40 IU/L Final         Passed - Cr in normal range and within 360 days    Creat  Date Value Ref Range Status  04/28/2016 0.72 0.60 - 0.93 mg/dL Final    Comment:      For patients > or = 81 years of age: The upper reference limit for Creatinine is approximately 13% higher for people identified as African-American.      Creatinine, Ser  Date Value Ref Range Status  09/16/2018 0.82 0.57 - 1.00 mg/dL Final         Passed - HCT in normal range and within 360 days    HCT  Date Value Ref Range Status  08/25/2018 35.0 (L) 36.0 - 46.0 % Final   HCT, POC  Date Value Ref Range Status  09/16/2018 38.4 29 - 41 % Final   Hematocrit  Date Value Ref Range Status  11/12/2017 37.4 34.0 - 46.6 % Final         Passed - HGB in normal range and within 360 days    Hemoglobin  Date Value Ref Range Status  09/16/2018 13.0 11 - 14.6 g/dL Final  08/25/2018 11.9 (L) 12.0 - 15.0 g/dL Final  11/12/2017 12.5 11.1 - 15.9 g/dL Final         Passed - PLT in normal range and within 360 days    Platelets  Date Value Ref Range Status  06/17/2018 175 150 - 400 K/uL Final  11/12/2017 204 150 - 379 x10E3/uL Final   Platelet Count, POC  Date Value Ref Range Status  09/16/2018 208 142 - 424 K/uL Final         Passed - Valid encounter within last 12 months    Recent Outpatient Visits          2 months ago Acute pain of right knee   [facility], Ranell Patrick, MD   2  months ago Essential hypertension   [facility], Lilia Argue, MD   4 months ago Essential hypertension   [facility], Lilia Argue, MD   5 months ago Essential hypertension   [facility], Lilia Argue, MD   5 months ago Essential hypertension   [facility] at [facility], [place], [place]

## 2018-12-13 ENCOUNTER — Telehealth: Payer: Self-pay | Admitting: Family Medicine

## 2018-12-13 NOTE — Telephone Encounter (Signed)
Copied from [place] [id number]. Topic: Quick Communication - See Telephone Encounter >> Dec 13, 2018  3:13 PM Robina Ade, Helene Kelp D wrote: CRM for notification. See Telephone encounter for: 12/13/18. Patient called with a sign language interpreter wanting to talk to Dr. Pamella Pert or her [place] regarding her xarelto and she has notice that her urine is darker and is not sure if its due to the medication.  Please call patient back, thanks. If no answer please leave a VM.

## 2018-12-18 DIAGNOSIS — M25561 Pain in right knee: Secondary | ICD-10-CM | POA: Diagnosis not present

## 2018-12-18 DIAGNOSIS — M1711 Unilateral primary osteoarthritis, right knee: Secondary | ICD-10-CM | POA: Diagnosis not present

## 2018-12-20 NOTE — Telephone Encounter (Signed)
Call to pt to f/u on urine color change.  Pt states urine has since returned to clearish yellow.  Advised of concern for increased bleeding d/t Xarelto.  Informed pt to c/b to clinic if urine turns dark again.  Pt asks if she should be f/u for leg.  No f/u for clot since ED.  Denies swelling, redness, warmth, pain of leg.  Placed on hold for scheduling.

## 2018-12-27 ENCOUNTER — Ambulatory Visit: Payer: Self-pay

## 2018-12-27 NOTE — Telephone Encounter (Signed)
Patient called and says she has swelling to her right foot that started yesterday. She says she was out yesterday for about 2 hours shopping and when she took her shoes off, a strap sandal, her right foot swelled up. She says it has a knot on the top of the foot that was 3 inches and now today 1 inch. She says the foot is bruised and warm to touch, painful at a 6-7. She asked should she go to the ED so they can x-ray it, since she has a blood clot in her left leg and on Xarelto. I advised since the office is closed today and tomorrow that the ED would be the best choice, care advice given, patient verbalized understanding. Patient advised of the COVID restrictions and advised only the patient is allowed in the hospital, she verbalized understanding. Triage done by a sign language interpreter.    Reason for Disposition . [1] Swelling is painful to touch AND [2] fever  Answer Assessment - Initial Assessment Questions 1. ONSET: "When did the swelling start?" (e.g., minutes, hours, days)     Yesterday about 5 or 6 pm 2. LOCATION: "What part of the leg is swollen?"  "Are both legs swollen or just one leg?"     To my right foot 3. SEVERITY: "How bad is the swelling?" (e.g., localized; mild, moderate, severe)  - Localized - small area of swelling localized to one leg  - MILD pedal edema - swelling limited to foot and ankle, pitting edema < 1/4 inch (6 mm) deep, rest and elevation eliminate most or all swelling  - MODERATE edema - swelling of lower leg to knee, pitting edema > 1/4 inch (6 mm) deep, rest and elevation only partially reduce swelling  - SEVERE edema - swelling extends above knee, facial or hand swelling present      Moderate to the foot 4. REDNESS: "Does the swelling look red or infected?"     Redness to the bump on the top of the right foot, bulged at 3 inches yesterday, just a bruise to the foot 5. PAIN: "Is the swelling painful to touch?" If so, ask: "How painful is it?"   (Scale 1-10;  mild, moderate or severe)     6-7 today 6. FEVER: "Do you have a fever?" If so, ask: "What is it, how was it measured, and when did it start?"      No 7. CAUSE: "What do you think is causing the leg swelling?"     No 8. MEDICAL HISTORY: "Do you have a history of heart failure, kidney disease, liver failure, or cancer?"     No 9. RECURRENT SYMPTOM: "Have you had leg swelling before?" If so, ask: "When was the last time?" "What happened that time?"     Never have 10. OTHER SYMPTOMS: "Do you have any other symptoms?" (e.g., chest pain, difficulty breathing)     No 11. PREGNANCY: "Is there any chance you are pregnant?" "When was your last menstrual period?"       No  Protocols used: LEG SWELLING AND EDEMA-A-AH

## 2018-12-28 ENCOUNTER — Encounter (HOSPITAL_COMMUNITY): Payer: Self-pay | Admitting: *Deleted

## 2018-12-28 ENCOUNTER — Emergency Department (HOSPITAL_COMMUNITY)
Admission: EM | Admit: 2018-12-28 | Discharge: 2018-12-29 | Disposition: A | Discharge status: Home / Self Care | Payer: Medicare Other | Attending: Emergency Medicine | Admitting: Emergency Medicine

## 2018-12-28 ENCOUNTER — Other Ambulatory Visit: Payer: Self-pay

## 2018-12-28 ENCOUNTER — Emergency Department (HOSPITAL_COMMUNITY): Payer: Medicare Other

## 2018-12-28 DIAGNOSIS — Z79899 Other long term (current) drug therapy: Secondary | ICD-10-CM | POA: Insufficient documentation

## 2018-12-28 DIAGNOSIS — R030 Elevated blood-pressure reading, without diagnosis of hypertension: Secondary | ICD-10-CM

## 2018-12-28 DIAGNOSIS — R600 Localized edema: Secondary | ICD-10-CM | POA: Diagnosis not present

## 2018-12-28 DIAGNOSIS — Y929 Unspecified place or not applicable: Secondary | ICD-10-CM | POA: Diagnosis not present

## 2018-12-28 DIAGNOSIS — X58XXXA Exposure to other specified factors, initial encounter: Secondary | ICD-10-CM | POA: Diagnosis not present

## 2018-12-28 DIAGNOSIS — Z7901 Long term (current) use of anticoagulants: Secondary | ICD-10-CM | POA: Insufficient documentation

## 2018-12-28 DIAGNOSIS — Z7982 Long term (current) use of aspirin: Secondary | ICD-10-CM | POA: Insufficient documentation

## 2018-12-28 DIAGNOSIS — S9031XA Contusion of right foot, initial encounter: Secondary | ICD-10-CM | POA: Insufficient documentation

## 2018-12-28 DIAGNOSIS — I10 Essential (primary) hypertension: Secondary | ICD-10-CM | POA: Insufficient documentation

## 2018-12-28 DIAGNOSIS — Y999 Unspecified external cause status: Secondary | ICD-10-CM | POA: Diagnosis not present

## 2018-12-28 DIAGNOSIS — S99921A Unspecified injury of right foot, initial encounter: Secondary | ICD-10-CM | POA: Diagnosis present

## 2018-12-28 DIAGNOSIS — R001 Bradycardia, unspecified: Secondary | ICD-10-CM | POA: Diagnosis not present

## 2018-12-28 DIAGNOSIS — Y939 Activity, unspecified: Secondary | ICD-10-CM | POA: Diagnosis not present

## 2018-12-28 LAB — CBC WITH DIFFERENTIAL/PLATELET
Abs Immature Granulocytes: 0.02 10*3/uL (ref 0.00–0.07)
Basophils Absolute: 0 10*3/uL (ref 0.0–0.1)
Basophils Relative: 0 %
Eosinophils Absolute: 0.1 10*3/uL (ref 0.0–0.5)
Eosinophils Relative: 2 %
HCT: 40.3 % (ref 36.0–46.0)
Hemoglobin: 12.9 g/dL (ref 12.0–15.0)
Immature Granulocytes: 0 %
Lymphocytes Relative: 46 %
Lymphs Abs: 2.5 10*3/uL (ref 0.7–4.0)
MCH: 30.7 pg (ref 26.0–34.0)
MCHC: 32 g/dL (ref 30.0–36.0)
MCV: 96 fL (ref 80.0–100.0)
Monocytes Absolute: 0.4 10*3/uL (ref 0.1–1.0)
Monocytes Relative: 8 %
Neutro Abs: 2.4 10*3/uL (ref 1.7–7.7)
Neutrophils Relative %: 44 %
Platelets: 172 10*3/uL (ref 150–400)
RBC: 4.2 MIL/uL (ref 3.87–5.11)
RDW: 12.8 % (ref 11.5–15.5)
WBC: 5.4 10*3/uL (ref 4.0–10.5)
nRBC: 0 % (ref 0.0–0.2)

## 2018-12-28 NOTE — ED Notes (Signed)
ASL tele pad at bedside.   Pt daughter can be reached at [phone], her name is Deloris Ping.

## 2018-12-28 NOTE — ED Provider Notes (Signed)
[facility] EMERGENCY DEPARTMENT Provider Note   CSN: [id number] Arrival date & time: 12/28/18  2115    History   Chief Complaint Chief Complaint  Patient presents with  . Foot Swelling    HPI Melissa Murphy is a 83 y.o. female.     Patient with history of left lower extremity DVT on anticoagulation presents the emergency department today with right foot pain and swelling which started yesterday.  Patient is deaf and requires ASL interpreter.  Patient states that she took her shoe off yesterday and noted swelling to the top of her right foot.  She states that her toes "looked black" for period of time after taking off her shoe.  The pain was fairly intense yesterday and is slightly improved today.  She denies associated fevers, chest pain, shortness of breath.  She does not have a history of gout.  She has a history of osteoarthritis in her knees for which she receives occasional injections.  She denies any numbness or tingling in the foot.  No new weakness.  She has been taking Tylenol with some relief.  She reports daily use of her anticoagulant as prescribed.     Past Medical History:  Diagnosis Date  . Anemia   . Arthritis   . Cataract   . Clotting disorder ([place])   . Deaf    Congenital; Requires an interpreter  . DVT (deep venous thrombosis) ([facility])    in eye, and leg  . Edema   . GERD (gastroesophageal reflux disease)   . Glaucoma   . Hernia   . Hiatal hernia   . Hyperlipidemia   . Hypertension   . Osteoporosis   . Sleep apnea   . Thyroid disease   . Ulcer   . Urinary incontinence   . Uterine polyp     Patient Active Problem List   Diagnosis Date Noted  . Soft tissue injury of left lower leg 02/07/2018  . Deafness 04/03/2014  . Insomnia 04/01/2014  . Hiatal hernia 12/08/2013  . Obstructive sleep apnea 01/10/2013  . Uterine polyp   . Unspecified chronic bronchitis ([place]) 10/02/2012  . 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  Hypertensive urgency 04/30/2012  . Edema 09/18/2011  . Anemia 09/18/2011    Past Surgical History:  Procedure Laterality Date  . CATARACT EXTRACTION     both eyes  . DILATATION & CURRETTAGE/HYSTEROSCOPY WITH RESECTOCOPE N/A 10/11/2012   Procedure: DILATATION & CURETTAGE/HYSTEROSCOPY WITH RESECTOCOPE;  Surgeon: Allena Katz, MD;  Location: [facility];  Service: Gynecology;  Laterality: N/A;  pt is deaf; please contact daughter Heide Spark [phone]  . EYE SURGERY    . goiter removed     age 23  . HERNIA REPAIR    . HIATAL HERNIA REPAIR N/A 12/08/2013   Procedure: LAPAROSCOPIC REPAIR OF HIATAL HERNIA  ;  Surgeon: [name], MD;  Location: [facility];  Service: General;  Laterality: N/A;  . REFRACTIVE SURGERY    . TONSILLECTOMY    . xanthoma tumor removed       OB History    Gravida  4   Para  3   Term      Preterm      AB  1   Living  3     SAB  1   TAB      Ectopic      Multiple      Live Births  Home Medications    Prior to Admission medications   Medication Sig Start Date End Date Taking? Authorizing Provider  aspirin 81 MG chewable tablet Chew 81 mg by mouth at bedtime.     [provider]  cloNIDine (CATAPRES) 0.1 MG tablet Take 1 tablet (0.1 mg total) by mouth 2 (two) times daily. 09/16/18   Rutherford Guys, MD  furosemide (LASIX) 20 MG tablet TAKE 1 TABLET BY MOUTH EVERY DAY 11/22/18   Rutherford Guys, MD  HYDROcodone-acetaminophen (NORCO/VICODIN) 5-325 MG tablet Take 1 tablet by mouth every 6 (six) hours as needed for moderate pain. 09/27/18   Wendie Agreste, MD  latanoprost (XALATAN) 0.005 % ophthalmic solution Place 1 drop into both eyes at bedtime.  08/26/18   [provider]  metoprolol tartrate (LOPRESSOR) 50 MG tablet Take 1 tablet (50 mg total) by mouth 2 (two) times daily. 09/16/18   Rutherford Guys, MD  potassium chloride (K-DUR) 10 MEQ tablet Take 1 tablet (10 mEq total) by mouth daily. 09/16/18   Rutherford Guys, MD  ramipril (ALTACE) 10 MG capsule Take 1 capsule (10 mg total) by mouth 2 (two) times daily. 09/16/18   Rutherford Guys, MD  simvastatin (ZOCOR) 40 MG tablet Take 0.5 tablets (20 mg total) by mouth at bedtime. 09/16/18   Rutherford Guys, MD  tolterodine (DETROL LA) 4 MG 24 hr capsule Take 1 capsule (4 mg total) by mouth daily. 09/16/18   Rutherford Guys, MD  XARELTO 20 MG TABS tablet TAKE 1 TABLET (20 MG TOTAL) BY MOUTH DAILY WITH SUPPER. 12/11/18   Rutherford Guys, MD  zolpidem (AMBIEN) 10 MG tablet Take 0.5-1 tablets (5-10 mg total) by mouth at bedtime as needed for sleep. 11/21/18   Rutherford Guys, MD    Family History Family History  Problem Relation Age of Onset  . Cancer Mother 38       colon cancer  . Stroke Mother 11       cause of death  . Heart disease Mother   . Hypertension Mother   . Heart disease Father 83       AMI; cause of death  . Heart disease Brother        AMI x 2; tobacco abuse; CABG  . Hernia Brother   . Hyperlipidemia Brother   . Hypertension Brother   . Migraines Daughter   . Heart disease Paternal Grandmother   . Esophageal cancer Neg Hx   . Rectal cancer Neg Hx   . Stomach cancer Neg Hx     Social History Social History   Tobacco Use  . Smoking status: Never Smoker  . Smokeless tobacco: Never Used  Substance Use Topics  . Alcohol use: Yes    Alcohol/week: 0.0 standard drinks    Comment: occasionally  . Drug use: No     Allergies   Avastin [bevacizumab]; Clindamycin/lincomycin; Novocain [procaine]; and Sulfa antibiotics   Review of Systems Review of Systems  Constitutional: Negative for fever.  HENT: Negative for rhinorrhea and sore throat.   Eyes: Negative for redness.  Respiratory: Negative for cough.   Cardiovascular: Negative for chest pain.  Gastrointestinal: Negative for abdominal pain, diarrhea, nausea and vomiting.  Genitourinary: Negative for dysuria.  Musculoskeletal: Positive for arthralgias and myalgias. Negative  for gait problem.  Skin: Positive for color change. Negative for rash.  Neurological: Negative for headaches.     Physical Exam Updated Vital Signs BP (!) 190/80 (BP Location: Right  Arm)   Pulse (!) 55   Temp 98.7 F (37.1 C) (Oral)   Resp 16   SpO2 99%   Physical Exam Vitals signs and nursing note reviewed.  Constitutional:      Appearance: She is well-developed.  HENT:     Head: Normocephalic and atraumatic.  Eyes:     General:        Right eye: No discharge.        Left eye: No discharge.     Conjunctiva/sclera: Conjunctivae normal.  Neck:     Musculoskeletal: Normal range of motion and neck supple.  Cardiovascular:     Rate and Rhythm: Normal rate and regular rhythm.     Pulses:          Dorsalis pedis pulses are 2+ on the right side and 2+ on the left side.     Heart sounds: Normal heart sounds.  Pulmonary:     Effort: Pulmonary effort is normal.     Breath sounds: Normal breath sounds.  Abdominal:     Palpations: Abdomen is soft.     Tenderness: There is no abdominal tenderness.  Musculoskeletal:        General: Swelling present. No tenderness.     Comments: Patient with 3 cm area of induration, tenderness, redness with associated swelling over the dorsal aspect of the right foot.  No active drainage.  No signs of lymphangitis or extension of cellulitis.  Pain is worse with movement of the foot.  Skin:    General: Skin is warm and dry.  Neurological:     Mental Status: She is alert.      ED Treatments / Results  Labs (all labs ordered are listed, but only abnormal results are displayed) Labs Reviewed  BASIC METABOLIC PANEL - Abnormal; Notable for the following components:      Result Value   Glucose, Bld 100 (*)    All other components within normal limits  CBC WITH DIFFERENTIAL/PLATELET  LACTIC ACID, PLASMA    ED ECG REPORT   Date: 12/28/2018  Rate: 49  Rhythm: sinus bradycardia  QRS Axis: left  Intervals: normal  ST/T Wave abnormalities:  normal  Conduction Disutrbances:none  Narrative Interpretation:   Old EKG Reviewed: unchanged from 09/03/18  I have personally reviewed the EKG tracing and agree with the computerized printout as noted.  Radiology Dg Foot Complete Right  Result Date: 12/28/2018 CLINICAL DATA:  Right foot swelling, redness and pain. EXAM: RIGHT FOOT COMPLETE - 3+ VIEW COMPARISON:  None. FINDINGS: There is no evidence of fracture or dislocation. Hammertoe deformity of the digits. Small plantar calcaneal spur. There is no evidence of arthropathy or other focal bone abnormality. Diffuse soft tissue edema with focal soft tissue prominence overlying the dorsum of the metatarsals. No soft tissue air. IMPRESSION: Soft tissue edema, with focal nonspecific soft tissue prominence overlying the metatarsals. No soft tissue air or acute osseous abnormality. Electronically Signed   By: Keith Rake M.D.   On: 12/28/2018 23:28    Procedures .Marland KitchenIncision and Drainage Date/Time: 12/29/2018 2:11 AM Performed by: Carlisle Cater, PA-C Authorized by: Carlisle Cater, PA-C   Consent:    Consent obtained:  Verbal   Consent given by:  Patient   Risks discussed:  Bleeding, pain and infection   Alternatives discussed:  Observation Location:    Type:  Hematoma   Size:  3cm   Location:  Lower extremity   Lower extremity location:  Foot   Foot location:  R foot  Pre-procedure details:    Skin preparation:  Betadine Anesthesia (see MAR for exact dosages):    Anesthesia method:  Local infiltration   Local anesthetic:  Lidocaine 2% WITH epi Procedure details:    Needle aspiration: yes     Needle size:  18 G   Drainage:  Bloody   Drainage amount:  Scant Post-procedure details:    Patient tolerance of procedure:  Tolerated well, no immediate complications   (including critical care time)  Medications Ordered in ED Medications  lidocaine-EPINEPHrine (XYLOCAINE W/EPI) 2 %-1:100000 (with pres) injection 20 mL (has no  administration in time range)  cloNIDine (CATAPRES) tablet 0.1 mg (0.1 mg Oral Given 12/29/18 0130)     Initial Impression / Assessment and Plan / ED Course  I have reviewed the triage vital signs and the nursing notes.  Pertinent labs & imaging results that were available during my care of the patient were reviewed by me and considered in my medical decision making (see chart for details).        Patient seen and examined.  Patient with soft tissue infection of the right foot.  Will obtain lab work, bedside ultrasound, x-ray.  Patient stable at the current time.  Vital signs reviewed and are as follows: BP (!) 190/80 (BP Location: Right Arm)   Pulse (!) 55   Temp 98.7 F (37.1 C) (Oral)   Resp 16   SpO2 99%   Patient does note that her heart rate is not typically this low.  Her EKG obtained tonight looks very similar to previous EKG of January 2020.  She does take metoprolol.  2:10 AM Patient discussed with and seen by Dr. Leonides Schanz. X-ray personally reviewed. Will attempt needle aspiration to determine hematoma versus abscess.  Patient has not had her evening blood pressure medication and her blood pressure continues to elevate.  She will be given evening dose of clonidine.  She will take her other medications upon returning home.  Slight return of dark clotted blood. Will place on keflex for possible mild cellulitis. Post-procedure oozing controlled with 2 minutes of direct pressure.   Counseled patient on importance of elevation, use of cool compress or ice on the area.  Pt urged to return with worsening pain, worsening swelling, expanding area of redness or streaking up extremity, fever, or any other concerns. Urged to take complete course of antibiotics as prescribed. Pt verbalizes understanding and agrees with plan.   Final Clinical Impressions(s) / ED Diagnoses   Final diagnoses:  Traumatic hematoma of right foot, initial encounter  Elevated blood pressure reading   Patient  with redness and pain of her right foot.  Patient is anticoagulated.  She likely developed a hematoma to the area due to tight fitting shoes.  She was evaluated for abscess and infection tonight as above.  Lab work is reassuring.  She is not febrile.  Needle aspiration did not produce any pus or purulent material.  Will treat conservatively.  Cannot rule out mild cellulitis given redness and warmth of the area.  Patient given course of Keflex.  She appears well and is ready for discharge to home.  Return instructions as above.  ED Discharge Orders         Ordered    cephALEXin (KEFLEX) 500 MG capsule  3 times daily     12/29/18 0158           Carlisle Cater, PA-C 12/29/18 0214    Gareth Morgan, MD 12/31/18 0005

## 2018-12-28 NOTE — ED Triage Notes (Signed)
Right foot swelling, redness and pain. Denies fevers   Pt is hearing impaired, pt daughter at bedside to assist with triage.

## 2018-12-29 DIAGNOSIS — S9031XA Contusion of right foot, initial encounter: Secondary | ICD-10-CM | POA: Diagnosis not present

## 2018-12-29 LAB — BASIC METABOLIC PANEL
Anion gap: 8 (ref 5–15)
BUN: 16 mg/dL (ref 8–23)
CO2: 26 mmol/L (ref 22–32)
Calcium: 9.4 mg/dL (ref 8.9–10.3)
Chloride: 106 mmol/L (ref 98–111)
Creatinine, Ser: 0.73 mg/dL (ref 0.44–1.00)
GFR calc Af Amer: >60 mL/min (ref >60)
GFR calc non Af Amer: >60 mL/min (ref >60)
Glucose, Bld: 100 mg/dL — ABNORMAL HIGH (ref 70–99)
Potassium: 4.5 mmol/L (ref 3.5–5.1)
Sodium: 140 mmol/L (ref 135–145)

## 2018-12-29 LAB — LACTIC ACID, PLASMA: Lactic Acid, Venous: 0.9 mmol/L (ref 0.5–1.9)

## 2018-12-29 MED ORDER — CEPHALEXIN 500 MG PO CAPS: 500.0000 mg | ORAL_CAPSULE | Freq: Three times a day (TID) | ORAL | 0 refills | Status: DC

## 2018-12-29 MED ORDER — CLONIDINE HCL 0.1 MG PO TABS
0.1000 mg | ORAL_TABLET | Freq: Once | ORAL | Status: AC
  Administered 2018-12-29: 02:00:00 0.1 mg via ORAL
  Filled 2018-12-29: qty 1

## 2018-12-29 MED ORDER — LIDOCAINE-EPINEPHRINE 2 %-1:100000 IJ SOLN
20.0000 mL | Freq: Once | INTRAMUSCULAR | Status: DC
  Filled 2018-12-29: qty 20

## 2018-12-29 NOTE — Discharge Instructions (Signed)
Please read and follow all provided instructions.  Your diagnoses today include:  1. Traumatic hematoma of right foot, initial encounter   2. Elevated blood pressure reading     Tests performed today include:  Vital signs. See below for your results today.   Blood counts and electrolytes - no sign of severe infection  X-ray - no broken bones  Medications prescribed:   Keflex (cephalexin) - antibiotic to cover for possible mild soft tissue infection in foot  You have been prescribed an antibiotic medicine: take the entire course of medicine even if you are feeling better. Stopping early can cause the antibiotic not to work.  Take any prescribed medications only as directed.   Home care instructions:  Follow any educational materials contained in this packet. Keep affected area above the level of your heart when possible. Wash area gently twice a day with warm soapy water. Do not apply alcohol or hydrogen peroxide. Cover the area if it draining or weeping.   Follow-up instructions: Please follow-up with your primary care provider in the next 1 week for further evaluation of your symptoms.   Return instructions:  Return to the Emergency Department if you have:  Fever  Worsening symptoms  Worsening pain  Worsening swelling  Redness of the skin that moves away from the affected area, especially if it streaks away from the affected area   Any other emergent concerns  Your vital signs today were: BP (!) 186/66    Pulse (!) 54    Temp 98.7 F (37.1 C) (Oral)    Resp 18    SpO2 99%  If your blood pressure (BP) was elevated above 135/85 this visit, please have this repeated by your doctor within one month. --------------

## 2018-12-29 NOTE — ED Provider Notes (Signed)
Medical screening examination/treatment/procedure(s) were conducted as a shared visit with non-physician practitioner(s) and myself.  I personally evaluated the patient during the encounter.  EKG Interpretation  Date/Time:  Saturday Dec 28 2018 23:07:11 EDT Ventricular Rate:  49 PR Interval:    QRS Duration: 101 QT Interval:  480 QTC Calculation: 434 R Axis:   2 Text Interpretation:  Sinus bradycardia Borderline prolonged PR interval Abnormal R-wave progression, early transition Probable left ventricular hypertrophy No significant change since last tracing Confirmed by Pryor Curia [phone]) on 12/28/2018 11:29:06 PM   Patient is an 83 year old female presents to the emergency department with pain and swelling to the dorsal right foot.  States that she was wearing shoes that may have been too tight for her and noticed that the area was bruised appearing with purple/black discoloration and swelling.  States the next day she noticed it was red and warm.  No fever or chills.  On exam patient has dorsal swelling to the right foot but a strong DP pulse.  There is no calf tenderness or swelling.  Has had a previous left lower extremity DVT on anticoagulation.  I do not think this is a DVT today.  There is no joint effusion or bony deformity.  Compartments are soft.  This area is red and slightly warm to palpation without fluctuance or induration.  Needle aspiration reveals blood without signs of pus.  This is more likely a hematoma given minor trauma to this area with her recently wearing tight fitting shoes.  X-ray showed no acute abnormality.  Given there is some redness and warmth, will cover with oral antibiotics to cover for cellulitis as well.  Labs today are reassuring.  She is hypertensive which may be secondary to discomfort.  No symptoms associated with her hypertension.   Sarahlynn Cisnero, Delice Bison, DO 12/29/18 [phone]

## 2019-01-01 ENCOUNTER — Encounter: Payer: Self-pay | Admitting: Family Medicine

## 2019-01-01 ENCOUNTER — Other Ambulatory Visit: Payer: Self-pay

## 2019-01-01 ENCOUNTER — Ambulatory Visit (INDEPENDENT_AMBULATORY_CARE_PROVIDER_SITE_OTHER): Payer: Medicare Other | Admitting: Family Medicine

## 2019-01-01 VITALS — BP 141/61 | HR 53 | Temp 98.7°F | Resp 16 | Ht 64.0 in | Wt 192.0 lb

## 2019-01-01 DIAGNOSIS — L03115 Cellulitis of right lower limb: Secondary | ICD-10-CM

## 2019-01-01 DIAGNOSIS — T148XXA Other injury of unspecified body region, initial encounter: Secondary | ICD-10-CM | POA: Diagnosis not present

## 2019-01-01 DIAGNOSIS — M25571 Pain in right ankle and joints of right foot: Secondary | ICD-10-CM | POA: Diagnosis not present

## 2019-01-01 DIAGNOSIS — R2241 Localized swelling, mass and lump, right lower limb: Secondary | ICD-10-CM

## 2019-01-01 MED ORDER — HYDROCODONE-ACETAMINOPHEN 5-325 MG PO TABS: 1.0000 | ORAL_TABLET | Freq: Four times a day (QID) | ORAL | 0 refills | Status: DC | PRN

## 2019-01-01 NOTE — Patient Instructions (Addendum)
Continue cephalexin, 3 times per day if possible.  Elevate foot whenever able.  Clean area with soap and water.  Tylenol is fine if that treats pain, but hydrocodone if needed for more severe pain.  Follow-up with Dr. Pamella Pert in 2 days.  Sooner if worse   Cellulitis, Adult  Cellulitis is a skin infection. The infected area is usually warm, red, swollen, and tender. This condition occurs most often in the arms and lower legs. The infection can travel to the muscles, blood, and underlying tissue and become serious. It is very important to get treated for this condition. What are the causes? Cellulitis is caused by bacteria. The bacteria enter through a break in the skin, such as a cut, burn, insect bite, open sore, or crack. What increases the risk? This condition is more likely to occur in people who:  Have a weak body defense system (immune system).  Have open wounds on the skin, such as cuts, burns, bites, and scrapes. Bacteria can enter the body through these open wounds.  Are older than 81 years of age.  Have diabetes.  Have a type of long-lasting (chronic) liver disease (cirrhosis) or kidney disease.  Are obese.  Have a skin condition such as: ? Itchy rash (eczema). ? Slow movement of blood in the veins (venous stasis). ? Fluid buildup below the skin (edema).  Have had radiation therapy.  Use IV drugs. What are the signs or symptoms? Symptoms of this condition include:  Redness, streaking, or spotting on the skin.  Swollen area of the skin.  Tenderness or pain when an area of the skin is touched.  Warm skin.  A fever.  Chills.  Blisters. How is this diagnosed? This condition is diagnosed based on a medical history and physical exam. You may also have tests, including:  Blood tests.  Imaging tests. How is this treated? Treatment for this condition may include:  Medicines, such as antibiotic medicines or medicines to treat allergies  (antihistamines).  Supportive care, such as rest and application of cold or warm cloths (compresses) to the skin.  Hospital care, if the condition is severe. The infection usually starts to get better within 1-2 days of treatment. Follow these instructions at home:  Medicines  Take over-the-counter and prescription medicines only as told by your health care provider.  If you were prescribed an antibiotic medicine, take it as told by your health care provider. Do not stop taking the antibiotic even if you start to feel better. General instructions  Drink enough fluid to keep your urine pale yellow.  Do not touch or rub the infected area.  Raise (elevate) the infected area above the level of your heart while you are sitting or lying down.  Apply warm or cold compresses to the affected area as told by your health care provider.  Keep all follow-up visits as told by your health care provider. This is important. These visits let your health care provider make sure a more serious infection is not developing. Contact a health care provider if:  You have a fever.  Your symptoms do not begin to improve within 1-2 days of starting treatment.  Your bone or joint underneath the infected area becomes painful after the skin has healed.  Your infection returns in the same area or another area.  You notice a swollen bump in the infected area.  You develop new symptoms.  You have a general ill feeling (malaise) with muscle aches and pains. Get help right  away if:  Your symptoms get worse.  You feel very sleepy.  You develop vomiting or diarrhea that persists.  You notice red streaks coming from the infected area.  Your red area gets larger or turns dark in color. These symptoms may represent a serious problem that is an emergency. Do not wait to see if the symptoms will go away. Get medical help right away. Call your local emergency services (911 in the [place] Do not drive yourself  to the hospital. Summary  Cellulitis is a skin infection. This condition occurs most often in the arms and lower legs.  Treatment for this condition may include medicines, such as antibiotic medicines or antihistamines.  Take over-the-counter and prescription medicines only as told by your health care provider. If you were prescribed an antibiotic medicine, do not stop taking the antibiotic even if you start to feel better.  Contact a health care provider if your symptoms do not begin to improve within 1-2 days of starting treatment or your symptoms get worse.  Keep all follow-up visits as told by your health care provider. This is important. These visits let your health care provider make sure that a more serious infection is not developing. This information is not intended to replace advice given to you by your health care provider. Make sure you discuss any questions you have with your health care provider. Document Released: 05/10/2005 Document Revised: 12/20/2017 Document Reviewed: 12/20/2017 Elsevier Interactive Patient Education  2019 [employer]  Hematoma A hematoma is a collection of blood under the skin, in an organ, in a body space, in a joint space, or in other tissue. The blood can thicken (clot) to form a lump that you can see and feel. The lump is often firm and may become sore and tender. Most hematomas get better in a few days to weeks. However, some hematomas may be serious and require medical care. Hematomas can range from very small to very large. What are the causes? This condition is caused by:  A blunt or penetrating injury.  A leakage from a blood vessel under the skin.  Some medical procedures, including surgeries, such as oral surgery, face lifts, and surgeries on the joints.  Some medical conditions that cause bleeding or bruising. There may be multiple hematomas that appear in different areas of the body. What increases the risk? You are more likely to  develop this condition if:  You are an older adult.  You use blood thinners. What are the signs or symptoms?  Symptoms of this condition depend on where the hematoma is located.  Common symptoms of a hematoma that is under the skin include:  A firm lump on the body.  Pain and tenderness in the area.  Bruising. Blue, dark blue, purple-red, or yellowish skin (discoloration) may appear at the site of the hematoma if the hematoma is close to the surface of the skin. Common symptoms of a hematoma that is deep in the tissues or body spaces may be less obvious. They include:  A collection of blood in the stomach (intra-abdominal hematoma). This may cause pain in the abdomen, weakness, fainting, and shortness of breath.  A collection of blood in the head (intracranial hematoma). This may cause a headache or symptoms such as weakness, trouble speaking or understanding, or a change in consciousness. How is this diagnosed? This condition is diagnosed based on:  Your medical history.  A physical exam.  Imaging tests, such as an ultrasound or CT scan. These may  be needed if your health care provider suspects a hematoma in deeper tissues or body spaces.  Blood tests. These may be needed if your health care provider believes that the hematoma is caused by a medical condition. How is this treated? Treatment for this condition depends on the cause, size, and location of the hematoma. Treatment may include:  Doing nothing. The majority of hematomas do not need treatment as many of them go away on their own over time.  Surgery or close monitoring. This may be needed for large hematomas or hematomas that affect vital organs.  Medicines. Medicines may be given if there is an underlying medical cause for the hematoma. Follow these instructions at home: Managing pain, stiffness, and swelling   If directed, put ice on the affected area. ? Put ice in a plastic bag. ? Place a towel between your  skin and the bag. ? Leave the ice on for 20 minutes, 2-3 times a day for the first couple of days.  If directed, apply heat to the affected area after applying ice for a couple of days. Use the heat source that your health care provider recommends, such as a moist heat pack or a heating pad. ? Place a towel between your skin and the heat source. ? Leave the heat on for 20-30 minutes. ? Remove the heat if your skin turns bright red. This is especially important if you are unable to feel pain, heat, or cold. You may have a greater risk of getting burned.  Raise (elevate) the affected area above the level of your heart while you are sitting or lying down.  If told, wrap the affected area with an elastic bandage. The bandage applies pressure (compression) to the area, which may help to reduce swelling and promote healing. Do not wrap the bandage too tightly around the affected area.  If your hematoma is on a leg or foot (lower extremity) and is painful, your health care provider may recommend crutches. Use them as told by your health care provider. General instructions  Take over-the-counter and prescription medicines only as told by your health care provider.  Keep all follow-up visits as told by your health care provider. This is important. Contact a health care provider if:  You have a fever.  The swelling or discoloration gets worse.  You develop more hematomas. Get help right away if:  Your pain is worse or your pain is not controlled with medicine.  Your skin over the hematoma breaks or starts bleeding.  Your hematoma is in your chest or abdomen and you have weakness, shortness of breath, or a change in consciousness.  You have a hematoma on your scalp that is caused by a fall or injury, and you also have: ? A headache that gets worse. ? Trouble speaking or understanding speech. ? Weakness. ? Change in alertness or consciousness. Summary  A hematoma is a collection of blood  under the skin, in an organ, in a body space, in a joint space, or in other tissue.  This condition usually does not need treatment because many hematomas go away on their own over time.  Large hematomas, or those that may affect vital organs, may need surgical drainage or monitoring. If the hematoma is caused by a medical condition, medicines may be prescribed.  Get help right away if your hematoma breaks or starts to bleed, you have shortness of breath, or you have a headache or trouble speaking after a fall. This information is not  intended to replace advice given to you by your health care provider. Make sure you discuss any questions you have with your health care provider. Document Released: 03/14/2004 Document Revised: 01/03/2018 Document Reviewed: 01/03/2018 Elsevier Interactive Patient Education  [employer].   If you have lab work done today you will be contacted with your lab results within the next 2 weeks.  If you have not heard from [place] then please contact us. The fastest way to get your results is to register for My Chart.   IF you received an x-ray today, you will receive an invoice from [facility]. Please contact [facility] at [phone] with questions or concerns regarding your invoice.   IF you received labwork today, you will receive an invoice from [facility]. Please contact [facility] at [phone] with questions or concerns regarding your invoice.   Our billing staff will not be able to assist you with questions regarding bills from these companies.  You will be contacted with the lab results as soon as they are available. The fastest way to get your results is to activate your My Chart account. Instructions are located on the last page of this paperwork. If you have not heard from [place] regarding the results in 2 weeks, please contact this office.

## 2019-01-01 NOTE — Progress Notes (Signed)
Subjective:    Patient ID: Melissa Murphy, female    DOB: July 26, 1936, 83 y.o.   MRN: [id number]  HPI Melissa Murphy is a 83 y.o. female Presents today for: Chief Complaint  Patient presents with  . Foot Swelling    x friday, right foot is swollen and red. pt went to ER and they sent her to follow up with pcp  Rutherford Guys, MD is primary provider.  Seen with sign language interpreter.   ER records reviewed.   Right foot swelling: Evaluated 4 days ago at [facility] emergency room.  Thought to have been related to wearing shoes that were too tight and noticed a bruising appearance with discoloration and swelling.  Redness and warmness following day.  Appeared to have dorsal swelling, no joint effusion or bone deformity.  Compartments were soft.  Needle aspiration of the area revealed blood without signs of pus.  Suspected hematoma given minor trauma.  X-ray without acute abnormality -soft tissue overlying the metatarsals.  Started on Keflex 500 mg tid for possible cellulitis given redness and warmth.  Reassuring labs with normal CBC, lactate.  Taking keflex BID to tid - has forgotten third dose a few times. Felt temporarily better after  Still having foot pain. Worse yesterday, better today. Overall a little better since hospital visit but still painful. Swelling has improved.  Tx: tylenol every 4-6 hours. Helps some.  No fever. Elevated BP in ER thought to be due to pain.   Patient Active Problem List   Diagnosis Date Noted  . Soft tissue injury of left lower leg 02/07/2018  . Deafness 04/03/2014  . Insomnia 04/01/2014  . Hiatal hernia 12/08/2013  . Obstructive sleep apnea 01/10/2013  . Uterine polyp   . Unspecified chronic bronchitis ([place]) 10/02/2012  . 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 Hypertensive urgency 04/30/2012  . Edema 09/18/2011  . Anemia 09/18/2011   Past Medical History:  Diagnosis Date  . Anemia   . Arthritis   . Cataract   . Clotting disorder  ([place])   . Deaf    Congenital; Requires an interpreter  . DVT (deep venous thrombosis) ([facility])    in eye, and leg  . Edema   . GERD (gastroesophageal reflux disease)   . Glaucoma   . Hernia   . Hiatal hernia   . Hyperlipidemia   . Hypertension   . Osteoporosis   . Sleep apnea   . Thyroid disease   . Ulcer   . Urinary incontinence   . Uterine polyp    Past Surgical History:  Procedure Laterality Date  . CATARACT EXTRACTION     both eyes  . DILATATION & CURRETTAGE/HYSTEROSCOPY WITH RESECTOCOPE N/A 10/11/2012   Procedure: DILATATION & CURETTAGE/HYSTEROSCOPY WITH RESECTOCOPE;  Surgeon: Allena Katz, MD;  Location: [facility];  Service: Gynecology;  Laterality: N/A;  pt is deaf; please contact daughter Heide Spark [phone]  . EYE SURGERY    . goiter removed     age 87  . HERNIA REPAIR    . HIATAL HERNIA REPAIR N/A 12/08/2013   Procedure: LAPAROSCOPIC REPAIR OF HIATAL HERNIA  ;  Surgeon: [name], MD;  Location: [facility];  Service: General;  Laterality: N/A;  . REFRACTIVE SURGERY    . TONSILLECTOMY    . xanthoma tumor removed     Allergies  Allergen Reactions  . Avastin [Bevacizumab] Nausea And Vomiting  . Clindamycin/Lincomycin Itching  . Novocain [Procaine] Other (See Comments)    Seizures   .  Sulfa Antibiotics Other (See Comments)    Causes fevers   Prior to Admission medications   Medication Sig Start Date End Date Taking? Authorizing Provider  aspirin 81 MG chewable tablet Chew 81 mg by mouth at bedtime.    Yes [provider]  cephALEXin (KEFLEX) 500 MG capsule Take 1 capsule (500 mg total) by mouth 3 (three) times daily. 12/29/18  Yes Carlisle Cater, PA-C  cloNIDine (CATAPRES) 0.1 MG tablet Take 1 tablet (0.1 mg total) by mouth 2 (two) times daily. 09/16/18  Yes Rutherford Guys, MD  furosemide (LASIX) 20 MG tablet TAKE 1 TABLET BY MOUTH EVERY DAY 11/22/18  Yes Rutherford Guys, MD  HYDROcodone-acetaminophen (NORCO/VICODIN) 5-325 MG tablet Take 1 tablet by  mouth every 6 (six) hours as needed for moderate pain. 09/27/18  Yes Wendie Agreste, MD  latanoprost (XALATAN) 0.005 % ophthalmic solution Place 1 drop into both eyes at bedtime.  08/26/18  Yes [provider]  metoprolol tartrate (LOPRESSOR) 50 MG tablet Take 1 tablet (50 mg total) by mouth 2 (two) times daily. 09/16/18  Yes Rutherford Guys, MD  potassium chloride (K-DUR) 10 MEQ tablet Take 1 tablet (10 mEq total) by mouth daily. 09/16/18  Yes Rutherford Guys, MD  ramipril (ALTACE) 10 MG capsule Take 1 capsule (10 mg total) by mouth 2 (two) times daily. 09/16/18  Yes Rutherford Guys, MD  simvastatin (ZOCOR) 40 MG tablet Take 0.5 tablets (20 mg total) by mouth at bedtime. 09/16/18  Yes Rutherford Guys, MD  tolterodine (DETROL LA) 4 MG 24 hr capsule Take 1 capsule (4 mg total) by mouth daily. 09/16/18  Yes Rutherford Guys, MD  XARELTO 20 MG TABS tablet TAKE 1 TABLET (20 MG TOTAL) BY MOUTH DAILY WITH SUPPER. 12/11/18  Yes Rutherford Guys, MD  zolpidem (AMBIEN) 10 MG tablet Take 0.5-1 tablets (5-10 mg total) by mouth at bedtime as needed for sleep. 11/21/18  Yes Rutherford Guys, MD   Social History   Socioeconomic History  . Marital status: Married    Spouse name: Not on file  . Number of children: 3  . Years of education: Grad   . Highest education level: Not on file  Occupational History  . 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 Financial resource strain: Not on file  . Food insecurity:    Worry: Not on file    Inability: Not on file  . Transportation needs:    Medical: Not on file    Non-medical: Not on file  Tobacco Use  . Smoking status: Never Smoker  . Smokeless tobacco: Never Used  Substance and Sexual Activity  . Alcohol use: Yes    Alcohol/week: 0.0 standard drinks    Comment: occasionally  . Drug use: No  . Sexual activity: Not Currently  Lifestyle  . Physical activity:    Days per week: Not on file    Minutes per session: Not on file  . Stress: Not on file   Relationships  . Social connections:    Talks on phone: Not on file    Gets together: Not on file    Attends religious service: Not on file    Active member of club or organization: Not on file    Attends meetings of clubs or organizations: Not on file    Relationship status: Not on file  . Intimate partner violence:    Fear of current or ex partner: Not on file    Emotionally  abused: Not on file    Physically abused: Not on file    Forced sexual activity: Not on file  Other Topics Concern  . Not on file  Social History Narrative   Marital status: married x 1999; second marriage; first husband died from AMI in 56;  husband is deaf as well.  Moderately happy; no abuse.      Children: 3 children; 2 grandchildren.  2 gg      Lives: with husband and daughter.  Moved from [place]; still has home in [place] but plans to remain in [place].      Employment: retired; Hospital doctor.      Tobacco:  Never      Alcohol: none      Exercising:  Walking and cleaning the house.      ADLs:  Uses cane for ambulation; has walker; no cooking; cleans house on own; pays own bills; goes to grocery store with husband. Performs self hygiene.        Advanced Directives:  None.  FULL CODE desired; no prolonged measures.    Review of Systems As in HPI.     Objective:   Physical Exam Vitals signs reviewed.  Constitutional:      General: She is not in acute distress.    Appearance: She is well-developed.  HENT:     Head: Normocephalic and atraumatic.  Cardiovascular:     Rate and Rhythm: Normal rate.  Pulmonary:     Effort: Pulmonary effort is normal.  Musculoskeletal:     Right lower leg: No edema (Calf nontender, no appreciable calf swelling.).  Skin:      Neurological:     Mental Status: She is alert and oriented to person, place, and time.    Vitals:   01/01/19 1427  BP: (!) 141/61  Pulse: (!) 53  Resp: 16  Temp: 98.7 F (37.1 C)  TempSrc: Oral  SpO2: 94%  Weight: 192 lb (87.1  kg)  Height: 5\' 4"  (1.626 m)       Assessment & Plan:    Melissa Murphy is a 83 y.o. female Localized swelling of right foot  Traumatic hematoma  Cellulitis of foot, right  Pain, joint, foot, right - Plan: HYDROcodone-acetaminophen (NORCO/VICODIN) 5-325 MG tablet  Possible traumatic hematoma with constrictive footwear inflammation versus infection with cellulitis causing erythema.  Overall swelling has improved, reports some improvement overall since ER visit.  Afebrile.  Neurovascular intact distally.  No open wounds appreciated, no exudate was obtained from prior aspiration and with decreased swelling will avoid further aspiration at this time.  -Elevation, cleansing with soap and water, continue Keflex, goal of 3 times per day, Tylenol as needed for hydrocodone if more significant pain, potential side effects of medications discussed.  Recheck in 48 hours with Dr. Pamella Pert who also visualized foot today in office.  RTC/ER precautions if worse.   Meds ordered this encounter  Medications  . HYDROcodone-acetaminophen (NORCO/VICODIN) 5-325 MG tablet    Sig: Take 1 tablet by mouth every 6 (six) hours as needed for moderate pain.    Dispense:  10 tablet    Refill:  0   Patient Instructions     Continue cephalexin, 3 times per day if possible.  Elevate foot whenever able.  Clean area with soap and water.  Tylenol is fine if that treats pain, but hydrocodone if needed for more severe pain.  Follow-up with Dr. Pamella Pert in 2 days.  Sooner if worse   Cellulitis, Adult  Cellulitis  is a skin infection. The infected area is usually warm, red, swollen, and tender. This condition occurs most often in the arms and lower legs. The infection can travel to the muscles, blood, and underlying tissue and become serious. It is very important to get treated for this condition. What are the causes? Cellulitis is caused by bacteria. The bacteria enter through a break in the skin, such as a cut, burn,  insect bite, open sore, or crack. What increases the risk? This condition is more likely to occur in people who:  Have a weak body defense system (immune system).  Have open wounds on the skin, such as cuts, burns, bites, and scrapes. Bacteria can enter the body through these open wounds.  Are older than 81 years of age.  Have diabetes.  Have a type of long-lasting (chronic) liver disease (cirrhosis) or kidney disease.  Are obese.  Have a skin condition such as: ? Itchy rash (eczema). ? Slow movement of blood in the veins (venous stasis). ? Fluid buildup below the skin (edema).  Have had radiation therapy.  Use IV drugs. What are the signs or symptoms? Symptoms of this condition include:  Redness, streaking, or spotting on the skin.  Swollen area of the skin.  Tenderness or pain when an area of the skin is touched.  Warm skin.  A fever.  Chills.  Blisters. How is this diagnosed? This condition is diagnosed based on a medical history and physical exam. You may also have tests, including:  Blood tests.  Imaging tests. How is this treated? Treatment for this condition may include:  Medicines, such as antibiotic medicines or medicines to treat allergies (antihistamines).  Supportive care, such as rest and application of cold or warm cloths (compresses) to the skin.  Hospital care, if the condition is severe. The infection usually starts to get better within 1-2 days of treatment. Follow these instructions at home:  Medicines  Take over-the-counter and prescription medicines only as told by your health care provider.  If you were prescribed an antibiotic medicine, take it as told by your health care provider. Do not stop taking the antibiotic even if you start to feel better. General instructions  Drink enough fluid to keep your urine pale yellow.  Do not touch or rub the infected area.  Raise (elevate) the infected area above the level of your heart  while you are sitting or lying down.  Apply warm or cold compresses to the affected area as told by your health care provider.  Keep all follow-up visits as told by your health care provider. This is important. These visits let your health care provider make sure a more serious infection is not developing. Contact a health care provider if:  You have a fever.  Your symptoms do not begin to improve within 1-2 days of starting treatment.  Your bone or joint underneath the infected area becomes painful after the skin has healed.  Your infection returns in the same area or another area.  You notice a swollen bump in the infected area.  You develop new symptoms.  You have a general ill feeling (malaise) with muscle aches and pains. Get help right away if:  Your symptoms get worse.  You feel very sleepy.  You develop vomiting or diarrhea that persists.  You notice red streaks coming from the infected area.  Your red area gets larger or turns dark in color. These symptoms may represent a serious problem that is an emergency. Do not  wait to see if the symptoms will go away. Get medical help right away. Call your local emergency services (911 in the [place] Do not drive yourself to the hospital. Summary  Cellulitis is a skin infection. This condition occurs most often in the arms and lower legs.  Treatment for this condition may include medicines, such as antibiotic medicines or antihistamines.  Take over-the-counter and prescription medicines only as told by your health care provider. If you were prescribed an antibiotic medicine, do not stop taking the antibiotic even if you start to feel better.  Contact a health care provider if your symptoms do not begin to improve within 1-2 days of starting treatment or your symptoms get worse.  Keep all follow-up visits as told by your health care provider. This is important. These visits let your health care provider make sure that a more  serious infection is not developing. This information is not intended to replace advice given to you by your health care provider. Make sure you discuss any questions you have with your health care provider. Document Released: 05/10/2005 Document Revised: 12/20/2017 Document Reviewed: 12/20/2017 Elsevier Interactive Patient Education  2019 [employer]  Hematoma A hematoma is a collection of blood under the skin, in an organ, in a body space, in a joint space, or in other tissue. The blood can thicken (clot) to form a lump that you can see and feel. The lump is often firm and may become sore and tender. Most hematomas get better in a few days to weeks. However, some hematomas may be serious and require medical care. Hematomas can range from very small to very large. What are the causes? This condition is caused by:  A blunt or penetrating injury.  A leakage from a blood vessel under the skin.  Some medical procedures, including surgeries, such as oral surgery, face lifts, and surgeries on the joints.  Some medical conditions that cause bleeding or bruising. There may be multiple hematomas that appear in different areas of the body. What increases the risk? You are more likely to develop this condition if:  You are an older adult.  You use blood thinners. What are the signs or symptoms?  Symptoms of this condition depend on where the hematoma is located.  Common symptoms of a hematoma that is under the skin include:  A firm lump on the body.  Pain and tenderness in the area.  Bruising. Blue, dark blue, purple-red, or yellowish skin (discoloration) may appear at the site of the hematoma if the hematoma is close to the surface of the skin. Common symptoms of a hematoma that is deep in the tissues or body spaces may be less obvious. They include:  A collection of blood in the stomach (intra-abdominal hematoma). This may cause pain in the abdomen, weakness, fainting, and shortness of  breath.  A collection of blood in the head (intracranial hematoma). This may cause a headache or symptoms such as weakness, trouble speaking or understanding, or a change in consciousness. How is this diagnosed? This condition is diagnosed based on:  Your medical history.  A physical exam.  Imaging tests, such as an ultrasound or CT scan. These may be needed if your health care provider suspects a hematoma in deeper tissues or body spaces.  Blood tests. These may be needed if your health care provider believes that the hematoma is caused by a medical condition. How is this treated? Treatment for this condition depends on the cause, size, and location of  the hematoma. Treatment may include:  Doing nothing. The majority of hematomas do not need treatment as many of them go away on their own over time.  Surgery or close monitoring. This may be needed for large hematomas or hematomas that affect vital organs.  Medicines. Medicines may be given if there is an underlying medical cause for the hematoma. Follow these instructions at home: Managing pain, stiffness, and swelling   If directed, put ice on the affected area. ? Put ice in a plastic bag. ? Place a towel between your skin and the bag. ? Leave the ice on for 20 minutes, 2-3 times a day for the first couple of days.  If directed, apply heat to the affected area after applying ice for a couple of days. Use the heat source that your health care provider recommends, such as a moist heat pack or a heating pad. ? Place a towel between your skin and the heat source. ? Leave the heat on for 20-30 minutes. ? Remove the heat if your skin turns bright red. This is especially important if you are unable to feel pain, heat, or cold. You may have a greater risk of getting burned.  Raise (elevate) the affected area above the level of your heart while you are sitting or lying down.  If told, wrap the affected area with an elastic bandage. The  bandage applies pressure (compression) to the area, which may help to reduce swelling and promote healing. Do not wrap the bandage too tightly around the affected area.  If your hematoma is on a leg or foot (lower extremity) and is painful, your health care provider may recommend crutches. Use them as told by your health care provider. General instructions  Take over-the-counter and prescription medicines only as told by your health care provider.  Keep all follow-up visits as told by your health care provider. This is important. Contact a health care provider if:  You have a fever.  The swelling or discoloration gets worse.  You develop more hematomas. Get help right away if:  Your pain is worse or your pain is not controlled with medicine.  Your skin over the hematoma breaks or starts bleeding.  Your hematoma is in your chest or abdomen and you have weakness, shortness of breath, or a change in consciousness.  You have a hematoma on your scalp that is caused by a fall or injury, and you also have: ? A headache that gets worse. ? Trouble speaking or understanding speech. ? Weakness. ? Change in alertness or consciousness. Summary  A hematoma is a collection of blood under the skin, in an organ, in a body space, in a joint space, or in other tissue.  This condition usually does not need treatment because many hematomas go away on their own over time.  Large hematomas, or those that may affect vital organs, may need surgical drainage or monitoring. If the hematoma is caused by a medical condition, medicines may be prescribed.  Get help right away if your hematoma breaks or starts to bleed, you have shortness of breath, or you have a headache or trouble speaking after a fall. This information is not intended to replace advice given to you by your health care provider. Make sure you discuss any questions you have with your health care provider. Document Released: 03/14/2004  Document Revised: 01/03/2018 Document Reviewed: 01/03/2018 Elsevier Interactive Patient Education  [employer].   If you have lab work done today you will be  contacted with your lab results within the next 2 weeks.  If you have not heard from [place] then please contact us. The fastest way to get your results is to register for My Chart.   IF you received an x-ray today, you will receive an invoice from [facility]. Please contact [facility] at [phone] with questions or concerns regarding your invoice.   IF you received labwork today, you will receive an invoice from [facility]. Please contact [facility] at [phone] with questions or concerns regarding your invoice.   Our billing staff will not be able to assist you with questions regarding bills from these companies.  You will be contacted with the lab results as soon as they are available. The fastest way to get your results is to activate your My Chart account. Instructions are located on the last page of this paperwork. If you have not heard from [place] regarding the results in 2 weeks, please contact this office.        Signed,   Merri Ray, MD [facility].  01/01/19 3:41 PM

## 2019-01-03 ENCOUNTER — Other Ambulatory Visit: Payer: Self-pay

## 2019-01-03 ENCOUNTER — Encounter: Payer: Self-pay | Admitting: Family Medicine

## 2019-01-03 ENCOUNTER — Ambulatory Visit (INDEPENDENT_AMBULATORY_CARE_PROVIDER_SITE_OTHER): Payer: Medicare Other | Admitting: Family Medicine

## 2019-01-03 VITALS — BP 156/62 | HR 48 | Temp 98.1°F | Ht 64.0 in

## 2019-01-03 DIAGNOSIS — S90921A Unspecified superficial injury of right foot, initial encounter: Secondary | ICD-10-CM

## 2019-01-03 DIAGNOSIS — T148XXA Other injury of unspecified body region, initial encounter: Secondary | ICD-10-CM

## 2019-01-03 DIAGNOSIS — L03115 Cellulitis of right lower limb: Secondary | ICD-10-CM

## 2019-01-03 NOTE — Patient Instructions (Signed)
Hematoma  A hematoma is a collection of blood under the skin, in an organ, in a body space, in a joint space, or in other tissue. The blood can thicken (clot) to form a lump that you can see and feel. The lump is often firm and may become sore and tender. Most hematomas get better in a few days to weeks. However, some hematomas may be serious and require medical care. Hematomas can range from very small to very large.  What are the causes?  This condition is caused by:   A blunt or penetrating injury.   A leakage from a blood vessel under the skin.   Some medical procedures, including surgeries, such as oral surgery, face lifts, and surgeries on the joints.   Some medical conditions that cause bleeding or bruising. There may be multiple hematomas that appear in different areas of the body.  What increases the risk?  You are more likely to develop this condition if:   You are an older adult.   You use blood thinners.  What are the signs or symptoms?    Symptoms of this condition depend on where the hematoma is located.   Common symptoms of a hematoma that is under the skin include:   A firm lump on the body.   Pain and tenderness in the area.   Bruising. Blue, dark blue, purple-red, or yellowish skin (discoloration) may appear at the site of the hematoma if the hematoma is close to the surface of the skin.  Common symptoms of a hematoma that is deep in the tissues or body spaces may be less obvious. They include:   A collection of blood in the stomach (intra-abdominal hematoma). This may cause pain in the abdomen, weakness, fainting, and shortness of breath.   A collection of blood in the head (intracranial hematoma). This may cause a headache or symptoms such as weakness, trouble speaking or understanding, or a change in consciousness.  How is this diagnosed?  This condition is diagnosed based on:   Your medical history.   A physical exam.   Imaging tests, such as an ultrasound or CT scan. These may  be needed if your health care provider suspects a hematoma in deeper tissues or body spaces.   Blood tests. These may be needed if your health care provider believes that the hematoma is caused by a medical condition.  How is this treated?  Treatment for this condition depends on the cause, size, and location of the hematoma. Treatment may include:   Doing nothing. The majority of hematomas do not need treatment as many of them go away on their own over time.   Surgery or close monitoring. This may be needed for large hematomas or hematomas that affect vital organs.   Medicines. Medicines may be given if there is an underlying medical cause for the hematoma.  Follow these instructions at home:  Managing pain, stiffness, and swelling     If directed, put ice on the affected area.  ? Put ice in a plastic bag.  ? Place a towel between your skin and the bag.  ? Leave the ice on for 20 minutes, 2-3 times a day for the first couple of days.   If directed, apply heat to the affected area after applying ice for a couple of days. Use the heat source that your health care provider recommends, such as a moist heat pack or a heating pad.  ? Place a towel between your skin   and the heat source.  ? Leave the heat on for 20-30 minutes.  ? Remove the heat if your skin turns bright red. This is especially important if you are unable to feel pain, heat, or cold. You may have a greater risk of getting burned.   Raise (elevate) the affected area above the level of your heart while you are sitting or lying down.   If told, wrap the affected area with an elastic bandage. The bandage applies pressure (compression) to the area, which may help to reduce swelling and promote healing. Do not wrap the bandage too tightly around the affected area.   If your hematoma is on a leg or foot (lower extremity) and is painful, your health care provider may recommend crutches. Use them as told by your health care provider.  General  instructions   Take over-the-counter and prescription medicines only as told by your health care provider.   Keep all follow-up visits as told by your health care provider. This is important.  Contact a health care provider if:   You have a fever.   The swelling or discoloration gets worse.   You develop more hematomas.  Get help right away if:   Your pain is worse or your pain is not controlled with medicine.   Your skin over the hematoma breaks or starts bleeding.   Your hematoma is in your chest or abdomen and you have weakness, shortness of breath, or a change in consciousness.   You have a hematoma on your scalp that is caused by a fall or injury, and you also have:  ? A headache that gets worse.  ? Trouble speaking or understanding speech.  ? Weakness.  ? Change in alertness or consciousness.  Summary   A hematoma is a collection of blood under the skin, in an organ, in a body space, in a joint space, or in other tissue.   This condition usually does not need treatment because many hematomas go away on their own over time.   Large hematomas, or those that may affect vital organs, may need surgical drainage or monitoring. If the hematoma is caused by a medical condition, medicines may be prescribed.   Get help right away if your hematoma breaks or starts to bleed, you have shortness of breath, or you have a headache or trouble speaking after a fall.  This information is not intended to replace advice given to you by your health care provider. Make sure you discuss any questions you have with your health care provider.  Document Released: 03/14/2004 Document Revised: 01/03/2018 Document Reviewed: 01/03/2018  Elsevier Interactive Patient Education  2019 [employer] Inc[employer]

## 2019-01-03 NOTE — Progress Notes (Signed)
5/22/20202:47 PM  Danajah Birdsell 1935-12-13, 83 y.o., female [id number]  Chief Complaint  Patient presents with  . Edema    follow up for locilized swelling in right foot, having pain in the foot as well not as much as it was before    HPI:   Patient is a 83 y.o. female with past medical history significant for HTN, insomnia, deaf, OSA, HLP, urge incontinence, DVT LLE who presents today for  followup  ASL interpreter present Seen 2 days ago, Dr Carlota Raspberry for followup ER visit (12/28/2018) for right foot traumatic hematoma and cellulitis (I was able to see patient quickly then) On keflex Patient reports swelling and pain are improving She has been taking abx as prescribed Has not needed any of the pain medication Has been elevating most of the day Still hurts when she tries to walk Has not been icing or using compression Hematoma was drained in ER, reports minimal increase in size since then   Fall Risk  01/03/2019 01/01/2019 09/27/2018 09/16/2018 07/29/2018  Falls in the past year? 0 0 0 0 0  Number falls in past yr: 0 0 0 0 -  Injury with Fall? 0 0 0 0 -  Risk for fall due to : - - - Impaired mobility;Orthopedic patient -  Follow up - - Falls evaluation completed - -     Depression screen [facility] 2/9 01/03/2019 01/01/2019 09/27/2018  Decreased Interest 0 0 0  Down, Depressed, Hopeless 0 0 0  PHQ - 2 Score 0 0 0  Some recent data might be hidden    Allergies  Allergen Reactions  . Avastin [Bevacizumab] Nausea And Vomiting  . Clindamycin/Lincomycin Itching  . Novocain [Procaine] Other (See Comments)    Seizures   . Sulfa Antibiotics Other (See Comments)    Causes fevers    Prior to Admission medications   Medication Sig Start Date End Date Taking? Authorizing Provider  aspirin 81 MG chewable tablet Chew 81 mg by mouth at bedtime.    Yes [provider]  cephALEXin (KEFLEX) 500 MG capsule Take 1 capsule (500 mg total) by mouth 3 (three) times daily. 12/29/18  Yes  Carlisle Cater, PA-C  cloNIDine (CATAPRES) 0.1 MG tablet Take 1 tablet (0.1 mg total) by mouth 2 (two) times daily. 09/16/18  Yes Rutherford Guys, MD  furosemide (LASIX) 20 MG tablet TAKE 1 TABLET BY MOUTH EVERY DAY 11/22/18  Yes Rutherford Guys, MD  HYDROcodone-acetaminophen (NORCO/VICODIN) 5-325 MG tablet Take 1 tablet by mouth every 6 (six) hours as needed for moderate pain. 01/01/19  Yes Wendie Agreste, MD  latanoprost (XALATAN) 0.005 % ophthalmic solution Place 1 drop into both eyes at bedtime.  08/26/18  Yes [provider]  metoprolol tartrate (LOPRESSOR) 50 MG tablet Take 1 tablet (50 mg total) by mouth 2 (two) times daily. 09/16/18  Yes Rutherford Guys, MD  potassium chloride (K-DUR) 10 MEQ tablet Take 1 tablet (10 mEq total) by mouth daily. 09/16/18  Yes Rutherford Guys, MD  ramipril (ALTACE) 10 MG capsule Take 1 capsule (10 mg total) by mouth 2 (two) times daily. 09/16/18  Yes Rutherford Guys, MD  simvastatin (ZOCOR) 40 MG tablet Take 0.5 tablets (20 mg total) by mouth at bedtime. 09/16/18  Yes Rutherford Guys, MD  tolterodine (DETROL LA) 4 MG 24 hr capsule Take 1 capsule (4 mg total) by mouth daily. 09/16/18  Yes Rutherford Guys, MD  XARELTO 20 MG TABS tablet TAKE 1 TABLET (  20 MG TOTAL) BY MOUTH DAILY WITH SUPPER. 12/11/18  Yes Rutherford Guys, MD  zolpidem (AMBIEN) 10 MG tablet Take 0.5-1 tablets (5-10 mg total) by mouth at bedtime as needed for sleep. 11/21/18  Yes Rutherford Guys, MD    Past Medical History:  Diagnosis Date  . Anemia   . Arthritis   . Cataract   . Clotting disorder ([place])   . Deaf    Congenital; Requires an interpreter  . DVT (deep venous thrombosis) (HCC)    in eye, and leg  . Edema   . GERD (gastroesophageal reflux disease)   . Glaucoma   . Hernia   . Hiatal hernia   . Hyperlipidemia   . Hypertension   . Osteoporosis   . Sleep apnea   . Thyroid disease   . Ulcer   . Urinary incontinence   . Uterine polyp     Past Surgical History:  Procedure  Laterality Date  . CATARACT EXTRACTION     both eyes  . DILATATION & CURRETTAGE/HYSTEROSCOPY WITH RESECTOCOPE N/A 10/11/2012   Procedure: DILATATION & CURETTAGE/HYSTEROSCOPY WITH RESECTOCOPE;  Surgeon: Allena Katz, MD;  Location: [facility];  Service: Gynecology;  Laterality: N/A;  pt is deaf; please contact daughter Heide Spark [phone]  . EYE SURGERY    . goiter removed     age 39  . HERNIA REPAIR    . HIATAL HERNIA REPAIR N/A 12/08/2013   Procedure: LAPAROSCOPIC REPAIR OF HIATAL HERNIA  ;  Surgeon: [name], MD;  Location: [facility];  Service: General;  Laterality: N/A;  . REFRACTIVE SURGERY    . TONSILLECTOMY    . xanthoma tumor removed      Social History   Tobacco Use  . Smoking status: Never Smoker  . Smokeless tobacco: Never Used  Substance Use Topics  . Alcohol use: Yes    Alcohol/week: 0.0 standard drinks    Comment: occasionally    Family History  Problem Relation Age of Onset  . Cancer Mother 41       colon cancer  . Stroke Mother 42       cause of death  . Heart disease Mother   . Hypertension Mother   . Heart disease Father 45       AMI; cause of death  . Heart disease Brother        AMI x 2; tobacco abuse; CABG  . Hernia Brother   . Hyperlipidemia Brother   . Hypertension Brother   . Migraines Daughter   . Heart disease Paternal Grandmother   . Esophageal cancer Neg Hx   . Rectal cancer Neg Hx   . Stomach cancer Neg Hx     ROS Per hpi  OBJECTIVE:  Today's Vitals   01/03/19 1440  BP: (!) 156/62  Pulse: (!) 48  Temp: 98.1 F (36.7 C)  TempSrc: Oral  SpO2: 99%  Height: 5\' 4"  (1.626 m)   Body mass index is 32.96 kg/m.  Physical Exam  Gen: AAOx3, NAD Right foot: hematoma over distal foot, tender to touch, with erythema and swelling, no drainage. Skin is intact. NVI distally. Improved from 2 days ago.   ASSESSMENT and PLAN  1. Traumatic hematoma 2. Cellulitis of foot, right Improving. Complete abx course. Discussed RICE  therapy. RTC precautions reviewed.  Return in about 2 weeks (around 01/17/2019).    Rutherford Guys, MD Primary Care at [place], [place] [address] Ph.  [phone] Fax [phone]

## 2019-01-16 ENCOUNTER — Encounter: Payer: Self-pay | Admitting: Family Medicine

## 2019-01-16 ENCOUNTER — Other Ambulatory Visit: Payer: Self-pay

## 2019-01-16 ENCOUNTER — Ambulatory Visit (INDEPENDENT_AMBULATORY_CARE_PROVIDER_SITE_OTHER): Payer: Medicare Other | Admitting: Family Medicine

## 2019-01-16 VITALS — BP 128/84 | HR 95 | Temp 98.2°F

## 2019-01-16 DIAGNOSIS — R609 Edema, unspecified: Secondary | ICD-10-CM

## 2019-01-16 DIAGNOSIS — T148XXA Other injury of unspecified body region, initial encounter: Secondary | ICD-10-CM

## 2019-01-16 DIAGNOSIS — S90921D Unspecified superficial injury of right foot, subsequent encounter: Secondary | ICD-10-CM | POA: Diagnosis not present

## 2019-01-16 NOTE — Patient Instructions (Signed)
° ° ° °  If you have lab work done today you will be contacted with your lab results within the next 2 weeks.  If you have not heard from us then please contact us. The fastest way to get your results is to register for My Chart. ° ° °IF you received an x-ray today, you will receive an invoice from [facility]. Please contact [facility] at [phone] with questions or concerns regarding your invoice.  ° °IF you received labwork today, you will receive an invoice from [facility]. Please contact [facility] at [phone] with questions or concerns regarding your invoice.  ° °Our billing staff will not be able to assist you with questions regarding bills from these companies. ° °You will be contacted with the lab results as soon as they are available. The fastest way to get your results is to activate your My Chart account. Instructions are located on the last page of this paperwork. If you have not heard from us regarding the results in 2 weeks, please contact this office. °  ° ° ° °

## 2019-01-16 NOTE — Progress Notes (Signed)
6/4/20203:30 PM  Melissa Murphy Jan 02, 1936, 83 y.o., female [id number]  Chief Complaint  Patient presents with  . Follow-up    on blood clot, right leg is having some pain. Left leg new area of concern    HPI:   Patient is a 83 y.o. female with past medical history significant for HTN, insomnia, deaf, OSA, HLP, urge incontinence, DVT LLEwho presents today for  followup  Last seen a week ago Traumatic hematoma better, completed abx C/o left lower swelling mostly around ankle and lower leg No redness or warmth Denies any chest pain, SOB, palpitations  ASL interpreter present  Fall Risk  01/16/2019 01/03/2019 01/01/2019 09/27/2018 09/16/2018  Falls in the past year? 0 0 0 0 0  Number falls in past yr: 0 0 0 0 0  Injury with Fall? 0 0 0 0 0  Risk for fall due to : - - - - Impaired mobility;Orthopedic patient  Follow up - - - Falls evaluation completed -     Depression screen [facility] 2/9 01/16/2019 01/03/2019 01/01/2019  Decreased Interest 0 0 0  Down, Depressed, Hopeless 0 0 0  PHQ - 2 Score 0 0 0  Some recent data might be hidden    Allergies  Allergen Reactions  . Avastin [Bevacizumab] Nausea And Vomiting  . Clindamycin/Lincomycin Itching  . Novocain [Procaine] Other (See Comments)    Seizures   . Sulfa Antibiotics Other (See Comments)    Causes fevers    Prior to Admission medications   Medication Sig Start Date End Date Taking? Authorizing Provider  aspirin 81 MG chewable tablet Chew 81 mg by mouth at bedtime.    Yes [provider]  cephALEXin (KEFLEX) 500 MG capsule Take 1 capsule (500 mg total) by mouth 3 (three) times daily. 12/29/18  Yes Carlisle Cater, PA-C  cloNIDine (CATAPRES) 0.1 MG tablet Take 1 tablet (0.1 mg total) by mouth 2 (two) times daily. 09/16/18  Yes Rutherford Guys, MD  furosemide (LASIX) 20 MG tablet TAKE 1 TABLET BY MOUTH EVERY DAY 11/22/18  Yes Rutherford Guys, MD  HYDROcodone-acetaminophen (NORCO/VICODIN) 5-325 MG tablet Take 1 tablet by  mouth every 6 (six) hours as needed for moderate pain. 01/01/19  Yes Wendie Agreste, MD  latanoprost (XALATAN) 0.005 % ophthalmic solution Place 1 drop into both eyes at bedtime.  08/26/18  Yes [provider]  metoprolol tartrate (LOPRESSOR) 50 MG tablet Take 1 tablet (50 mg total) by mouth 2 (two) times daily. 09/16/18  Yes Rutherford Guys, MD  potassium chloride (K-DUR) 10 MEQ tablet Take 1 tablet (10 mEq total) by mouth daily. 09/16/18  Yes Rutherford Guys, MD  ramipril (ALTACE) 10 MG capsule Take 1 capsule (10 mg total) by mouth 2 (two) times daily. 09/16/18  Yes Rutherford Guys, MD  simvastatin (ZOCOR) 40 MG tablet Take 0.5 tablets (20 mg total) by mouth at bedtime. 09/16/18  Yes Rutherford Guys, MD  tolterodine (DETROL LA) 4 MG 24 hr capsule Take 1 capsule (4 mg total) by mouth daily. 09/16/18  Yes Rutherford Guys, MD  XARELTO 20 MG TABS tablet TAKE 1 TABLET (20 MG TOTAL) BY MOUTH DAILY WITH SUPPER. 12/11/18  Yes Rutherford Guys, MD  zolpidem (AMBIEN) 10 MG tablet Take 0.5-1 tablets (5-10 mg total) by mouth at bedtime as needed for sleep. 11/21/18  Yes Rutherford Guys, MD    Past Medical History:  Diagnosis Date  . Anemia   . Arthritis   .  Cataract   . Clotting disorder ([place])   . Deaf    Congenital; Requires an interpreter  . DVT (deep venous thrombosis) (HCC)    in eye, and leg  . Edema   . GERD (gastroesophageal reflux disease)   . Glaucoma   . Hernia   . Hiatal hernia   . Hyperlipidemia   . Hypertension   . Osteoporosis   . Sleep apnea   . Thyroid disease   . Ulcer   . Urinary incontinence   . Uterine polyp     Past Surgical History:  Procedure Laterality Date  . CATARACT EXTRACTION     both eyes  . DILATATION & CURRETTAGE/HYSTEROSCOPY WITH RESECTOCOPE N/A 10/11/2012   Procedure: DILATATION & CURETTAGE/HYSTEROSCOPY WITH RESECTOCOPE;  Surgeon: Allena Katz, MD;  Location: [facility];  Service: Gynecology;  Laterality: N/A;  pt is deaf; please contact daughter  Heide Spark [phone]  . EYE SURGERY    . goiter removed     age 29  . HERNIA REPAIR    . HIATAL HERNIA REPAIR N/A 12/08/2013   Procedure: LAPAROSCOPIC REPAIR OF HIATAL HERNIA  ;  Surgeon: [name], MD;  Location: [facility];  Service: General;  Laterality: N/A;  . REFRACTIVE SURGERY    . TONSILLECTOMY    . xanthoma tumor removed      Social History   Tobacco Use  . Smoking status: Never Smoker  . Smokeless tobacco: Never Used  Substance Use Topics  . Alcohol use: Yes    Alcohol/week: 0.0 standard drinks    Comment: occasionally    Family History  Problem Relation Age of Onset  . Cancer Mother 76       colon cancer  . Stroke Mother 90       cause of death  . Heart disease Mother   . Hypertension Mother   . Heart disease Father 54       AMI; cause of death  . Heart disease Brother        AMI x 2; tobacco abuse; CABG  . Hernia Brother   . Hyperlipidemia Brother   . Hypertension Brother   . Migraines Daughter   . Heart disease Paternal Grandmother   . Esophageal cancer Neg Hx   . Rectal cancer Neg Hx   . Stomach cancer Neg Hx     ROS Per hpi  OBJECTIVE:  Today's Vitals   01/16/19 1434  BP: 128/84  Pulse: 95  Temp: 98.2 F (36.8 C)  TempSrc: Oral  SpO2: 96%   There is no height or weight on file to calculate BMI.   Physical Exam   GEN: AAOx3, NAD CTAB, no w/r/r RRR, no m/r/g RLE: hemtoma present, smaller, not as tender, surrounding erythema improved, no warmth LLE: woody skin, edema, no erythema or warmth, compartments soft   ASSESSMENT and PLAN  1. Traumatic hematoma Cont supportive measures, RTC precautions reviewed - Ambulatory referral to Vascular Surgery  2. Peripheral edema Lymphedema? Referring to vasc surg, consider PT - Ambulatory referral to Vascular Surgery  Return in about 3 months (around 04/18/2019).    Rutherford Guys, MD Primary Care at [place], [place] [address] Ph.  [phone] Fax [phone]

## 2019-01-20 ENCOUNTER — Telehealth: Payer: Self-pay | Admitting: Family Medicine

## 2019-01-20 DIAGNOSIS — G4700 Insomnia, unspecified: Secondary | ICD-10-CM

## 2019-01-20 MED ORDER — ZOLPIDEM TARTRATE 10 MG PO TABS: 5.0000 mg | ORAL_TABLET | Freq: Every evening | ORAL | 2 refills | Status: DC | PRN

## 2019-01-20 NOTE — Telephone Encounter (Signed)
pmp reviewed  Med refilled 

## 2019-01-20 NOTE — Telephone Encounter (Signed)
Copied from [place] [id number]. Topic: Quick Communication - Rx Refill/Question >> Jan 20, 2019 10:04 AM Rutherford Nail, NT wrote: **Patient calling with sign language interpreter and states that the pharmacy is need approval to refill this medication early. States that they will not let her refill for another 2 days. States that she is needing this medication to sleep. Please advise.**  Medication: zolpidem (AMBIEN) 10 MG tablet   Has the patient contacted their pharmacy? yes (Agent: If no, request that the patient contact the pharmacy for the refill.) (Agent: If yes, when and what did the pharmacy advise?)  Preferred Pharmacy (with phone number or street name): [id number] - [place], [place]: Please be advised that RX refills may take up to 3 business days. We ask that you follow-up with your pharmacy.

## 2019-01-20 NOTE — Telephone Encounter (Signed)
Pt states she just need a refill of medication

## 2019-01-22 ENCOUNTER — Ambulatory Visit: Payer: Medicare Other | Admitting: Family Medicine

## 2019-01-30 ENCOUNTER — Other Ambulatory Visit: Payer: Self-pay

## 2019-01-30 DIAGNOSIS — R600 Localized edema: Secondary | ICD-10-CM

## 2019-01-31 ENCOUNTER — Other Ambulatory Visit: Payer: Self-pay

## 2019-01-31 DIAGNOSIS — I160 Hypertensive urgency: Secondary | ICD-10-CM

## 2019-01-31 MED ORDER — CLONIDINE HCL 0.1 MG PO TABS: 0.1000 mg | ORAL_TABLET | Freq: Two times a day (BID) | ORAL | 1 refills | Status: DC

## 2019-02-04 ENCOUNTER — Encounter: Payer: Medicare Other | Admitting: Vascular Surgery

## 2019-02-04 ENCOUNTER — Encounter: Payer: Self-pay | Admitting: Family

## 2019-02-04 ENCOUNTER — Encounter (HOSPITAL_COMMUNITY): Payer: Medicare Other

## 2019-03-13 ENCOUNTER — Other Ambulatory Visit: Payer: Self-pay | Admitting: Family Medicine

## 2019-03-19 ENCOUNTER — Telehealth: Payer: Self-pay | Admitting: Family Medicine

## 2019-03-19 NOTE — Telephone Encounter (Signed)
Copied from [place] [id number]. Topic: Quick Communication - Rx Refill/Question >> Mar 19, 2019  3:05 PM Erick Blinks wrote: Medication: XARELTO 20 MG TABS tablet  Has the patient contacted their pharmacy? Yes.   (Agent: If no, request that the patient contact the pharmacy for the refill.) (Agent: If yes, when and what did the pharmacy advise?)  Preferred Pharmacy (with phone number or street name): [id number] - [place], [place] [address] Phone: [phone] Fax: [phone]    Agent: Please be advised that RX refills may take up to 3 business days. We ask that you follow-up with your pharmacy.

## 2019-03-21 MED ORDER — RIVAROXABAN 20 MG PO TABS: 20.0000 mg | ORAL_TABLET | Freq: Every day | ORAL | 0 refills | Status: DC

## 2019-03-21 NOTE — Telephone Encounter (Signed)
Please advise this script looks different to me. Review and send to pharmacy if appropriate.

## 2019-03-24 ENCOUNTER — Other Ambulatory Visit: Payer: Self-pay | Admitting: Family Medicine

## 2019-04-01 ENCOUNTER — Ambulatory Visit: Payer: Medicare Other | Admitting: Family Medicine

## 2019-04-02 ENCOUNTER — Encounter: Payer: Self-pay | Admitting: Family Medicine

## 2019-04-02 ENCOUNTER — Ambulatory Visit (INDEPENDENT_AMBULATORY_CARE_PROVIDER_SITE_OTHER): Payer: Medicare Other | Admitting: Family Medicine

## 2019-04-02 ENCOUNTER — Other Ambulatory Visit: Payer: Self-pay

## 2019-04-02 VITALS — BP 162/82 | HR 73 | Temp 98.7°F | Resp 16 | Ht 64.0 in | Wt 200.0 lb

## 2019-04-02 DIAGNOSIS — Z23 Encounter for immunization: Secondary | ICD-10-CM | POA: Diagnosis not present

## 2019-04-02 DIAGNOSIS — I878 Other specified disorders of veins: Secondary | ICD-10-CM | POA: Diagnosis not present

## 2019-04-02 DIAGNOSIS — R600 Localized edema: Secondary | ICD-10-CM | POA: Diagnosis not present

## 2019-04-02 MED ORDER — FUROSEMIDE 40 MG PO TABS: 40.0000 mg | ORAL_TABLET | Freq: Every day | ORAL | 3 refills | Status: DC

## 2019-04-02 NOTE — Progress Notes (Signed)
Patient ID: Melissa Murphy, female    DOB: [birth year]  Age: 83 y.o. MRN: [id number]  Chief Complaint  Patient presents with  . Foot Swelling    x 3 days, both feet    Subjective:   Elderly lady who is deaf.  She has an interpreter with her.  She is seen me some years ago apparently.  Over the last for 5 days she is been having increasing edema in her legs.  No injuries.  She has had edema problems in the past.  Takes Lasix 20 daily.  No shortness of breath or orthopnea.  Normal urination.  She has been eating takeout food often, so she does not control the salt in her diet very well.  She does have a history of DVT.  She is on Xarelto.  No unilateral symptoms particularly though the left always hurts a little more than the right.  Current allergies, medications, problem list, past/family and social histories reviewed.  Objective:  BP (!) 162/82   Pulse 73   Temp 98.7 F (37.1 C) (Oral)   Resp 16   Ht 5\' 4"  (1.626 m)   Wt 200 lb (90.7 kg)   SpO2 96%   BMI 34.33 kg/m   Long discussion with her.  Chest clear.  Heart regular without murmurs.  Edematous from her knees down, 3-4+ edema.  More prominent veins on the left leg than the left right.  Negative Homans.  Her legs are touchy all the way down.  No major erythema.  She has redness on the top of the right foot where she has had a small I&D performed of a hematoma some months ago.  Assessment & Plan:   Assessment: 1. Need for influenza vaccination   2. Edema of both legs   3. Venous stasis       Plan: See instructions.  I think this is just a stasis edema from fluid overload, probably getting too much sodium.  See instructions.  Orders Placed This Encounter  Procedures  . Flu Vaccine QUAD High Dose(Fluad)  . Basic metabolic panel    Order Specific Question:   Has the patient fasted?    Answer:   No    Meds ordered this encounter  Medications  . furosemide (LASIX) 40 MG tablet    Sig: Take 1 tablet (40 mg total) by  mouth daily.    Dispense:  30 tablet    Refill:  3         Patient Instructions   Increase the furosemide (Lasix) to 40 mg daily.  That was prescribed.  Elevate the legs as often as possible.  Wear compression stockings as much of the time in the daytime as possible.  Avoid excessive salt in your diet, and be careful with the takeout food that you eat.  If the legs are getting more swollen and/or hurting more get rechecked promptly.  We are checking labs to make sure your kidney function tests are okay and the potassium is okay since we are increasing the Lasix which takes potassium out of the system.  We will let you know if there are any concerns.  I would advised arranging a follow-up appointment with Dr. Pamella Pert in about 1 month, sooner if problems.   If you have lab work done today you will be contacted with your lab results within the next 2 weeks.  If you have not heard from [place] then please contact us. The fastest way to get your results is to  register for My Chart.   IF you received an x-ray today, you will receive an invoice from [facility]. Please contact [facility] at [phone] with questions or concerns regarding your invoice.   IF you received labwork today, you will receive an invoice from [place]. Please contact [facility] at [phone] with questions or concerns regarding your invoice.   Our billing staff will not be able to assist you with questions regarding bills from these companies.  You will be contacted with the lab results as soon as they are available. The fastest way to get your results is to activate your My Chart account. Instructions are located on the last page of this paperwork. If you have not heard from [place] regarding the results in 2 weeks, please contact this office.         Return today (on 04/02/2019) for followup with Dr. Pamella Pert.   Ruben Reason, MD 04/02/2019

## 2019-04-02 NOTE — Patient Instructions (Addendum)
Increase the furosemide (Lasix) to 40 mg daily.  That was prescribed.  Elevate the legs as often as possible.  Wear compression stockings as much of the time in the daytime as possible.  Avoid excessive salt in your diet, and be careful with the takeout food that you eat.  If the legs are getting more swollen and/or hurting more get rechecked promptly.  We are checking labs to make sure your kidney function tests are okay and the potassium is okay since we are increasing the Lasix which takes potassium out of the system.  We will let you know if there are any concerns.  I would advised arranging a follow-up appointment with Dr. Pamella Pert in about 1 month, sooner if problems.   If you have lab work done today you will be contacted with your lab results within the next 2 weeks.  If you have not heard from [place] then please contact us. The fastest way to get your results is to register for My Chart.   IF you received an x-ray today, you will receive an invoice from [facility]. Please contact [facility] at [phone] with questions or concerns regarding your invoice.   IF you received labwork today, you will receive an invoice from [place]. Please contact [facility] at [phone] with questions or concerns regarding your invoice.   Our billing staff will not be able to assist you with questions regarding bills from these companies.  You will be contacted with the lab results as soon as they are available. The fastest way to get your results is to activate your My Chart account. Instructions are located on the last page of this paperwork. If you have not heard from [place] regarding the results in 2 weeks, please contact this office.

## 2019-04-03 LAB — BASIC METABOLIC PANEL
BUN/Creatinine Ratio: 31 — ABNORMAL HIGH (ref 12–28)
BUN: 22 mg/dL (ref 8–27)
CO2: 24 mmol/L (ref 20–29)
Calcium: 9.7 mg/dL (ref 8.7–10.3)
Chloride: 101 mmol/L (ref 96–106)
Creatinine, Ser: 0.71 mg/dL (ref 0.57–1.00)
GFR calc Af Amer: 93 mL/min/{1.73_m2} (ref >59)
GFR calc non Af Amer: 81 mL/min/{1.73_m2} (ref >59)
Glucose: 96 mg/dL (ref 65–99)
Potassium: 4.2 mmol/L (ref 3.5–5.2)
Sodium: 140 mmol/L (ref 134–144)

## 2019-04-10 DIAGNOSIS — M25561 Pain in right knee: Secondary | ICD-10-CM | POA: Diagnosis not present

## 2019-04-10 DIAGNOSIS — M1711 Unilateral primary osteoarthritis, right knee: Secondary | ICD-10-CM | POA: Diagnosis not present

## 2019-04-11 ENCOUNTER — Other Ambulatory Visit: Payer: Self-pay

## 2019-04-11 DIAGNOSIS — R600 Localized edema: Secondary | ICD-10-CM

## 2019-04-14 ENCOUNTER — Ambulatory Visit: Payer: Self-pay | Admitting: *Deleted

## 2019-04-14 NOTE — Telephone Encounter (Signed)
Patient reports falling yesterday.Tripped while wearing her house slippers.  Hit above her left eye on the coffee table. Has bruising and slight swelling. Bruised area approximately 1.5 inches X half inch. No cut/lacerations. No LOC/no confusion/no pain. Has not used ice/pain relief.  **Please disregard the 5 [place] note just below. I'm unable to delete it from this encounter. It is not related to this triage call.**  Patient is A/O, denies N/V/foggy headedness/dizziness. No headache. No blood noticed in urine/stool. Denies neck pain/vision difficulties. She takes Xarelto 20 mg tabs for a current DVT she reported.  Has OV with Cardiology and Vasc Surg tomorrow. OV with Dr. Pamella Pert on 04/17/19. Reviewed urgent symptoms of hemorrhage/concussion  should they arise that she would need to seek immediate treatment. Stated she understood. Routing encounter note to PCP for further advice or if appointment is preferred.   Please disregard 5 [place] note below.       [place]                                        Tarry Kos 04/14/2019, 3:49 PM  Reason for Disposition . Taking Coumadin (warfarin) or other strong blood thinner, or known bleeding disorder (e.g., thrombocytopenia)  Answer Assessment - Initial Assessment Questions 1. MECHANISM: "How did the injury happen?" For falls, ask: "What height did you fall from?" and "What surface did you fall against?"      Tripped over my own slippers while walking in the house. 2. ONSET: "When did the injury happen?" (Minutes or hours ago)      yesterday 3. NEUROLOGIC SYMPTOMS: "Was there any loss of consciousness?" "Are there any other neurological symptoms?"      No and no 4. MENTAL STATUS: "Does the person know who he is, who you are, and where he is?"      yes 5. LOCATION: "What part of the head was hit?"      Above the left eye 6. SCALP APPEARANCE: "What does the scalp look like? Is it bleeding now?" If so, ask:  "Is it difficult to stop?"      No cut or laceation 7. SIZE: For cuts, bruises, or swelling, ask: "How large is it?" (e.g., inches or centimeters)      Small area of bruising and minimal swelling. 8. PAIN: "Is there any pain?" If so, ask: "How bad is it?"  (e.g., Scale 1-10; or mild, moderate, severe)     no 9. TETANUS: For any breaks in the skin, ask: "When was the last tetanus booster?"     na 10. OTHER SYMPTOMS: "Do you have any other symptoms?" (e.g., neck pain, vomiting)       denies 11. PREGNANCY: "Is there any chance you are pregnant?" "When was your last menstrual period?"       na  Protocols used: HEAD INJURY-A-AH

## 2019-04-15 ENCOUNTER — Encounter: Payer: Self-pay | Admitting: Vascular Surgery

## 2019-04-15 ENCOUNTER — Ambulatory Visit (HOSPITAL_COMMUNITY)
Admission: RE | Admit: 2019-04-15 | Discharge: 2019-04-15 | Disposition: A | Discharge status: Home / Self Care | Payer: Medicare Other | Source: Ambulatory Visit | Attending: Vascular Surgery | Admitting: Vascular Surgery

## 2019-04-15 ENCOUNTER — Ambulatory Visit (INDEPENDENT_AMBULATORY_CARE_PROVIDER_SITE_OTHER): Payer: Medicare Other | Admitting: Vascular Surgery

## 2019-04-15 ENCOUNTER — Other Ambulatory Visit: Payer: Self-pay

## 2019-04-15 DIAGNOSIS — R600 Localized edema: Secondary | ICD-10-CM | POA: Diagnosis not present

## 2019-04-15 DIAGNOSIS — M7989 Other specified soft tissue disorders: Secondary | ICD-10-CM

## 2019-04-15 NOTE — Telephone Encounter (Signed)
L/m via sign language interpreter to check on pt status.  req'd c/b to office.

## 2019-04-15 NOTE — Progress Notes (Signed)
Patient was scheduled for her appointment with me at 3 PM.  She is deaf and chart indicates she needs sign language interpeter.  She was placed in a room at 4:30 PM with no interpreter.  No other family here.  When I went to see the patient I attempted to understand her presenting complaint and her current symptoms.  Communicating via handwriting notepad it was very difficult to understand what her current complaint with her leg swelling in particular her history of DVT and how we can help her moving forward.  I was also advised by the tech that this was a very limited reflux study in clinic given patient discomfort and limited evaluation.  Given difficulty understanding the patients presenting complaint,  I think the best option would be follow-up when an interpreter is available and we can attempt to repeat her study for better imaging.  I will not charge her for this visit.  I communicated that with the patient as well as my office staff helped facilitate that communication.  She was very frustrated when she left and I did apologize.  I do not think I can provide adequate care without better communication and when interpretor services are not available.  Marty Heck, MD [facility] Office: [phone] Pager: [place]

## 2019-04-17 ENCOUNTER — Ambulatory Visit (INDEPENDENT_AMBULATORY_CARE_PROVIDER_SITE_OTHER): Payer: Medicare Other | Admitting: Family Medicine

## 2019-04-17 ENCOUNTER — Encounter: Payer: Self-pay | Admitting: Family Medicine

## 2019-04-17 ENCOUNTER — Other Ambulatory Visit: Payer: Self-pay

## 2019-04-17 VITALS — BP 120/74 | HR 64 | Temp 99.0°F | Ht 68.0 in | Wt 197.0 lb

## 2019-04-17 DIAGNOSIS — R609 Edema, unspecified: Secondary | ICD-10-CM | POA: Diagnosis not present

## 2019-04-17 DIAGNOSIS — F5101 Primary insomnia: Secondary | ICD-10-CM | POA: Diagnosis not present

## 2019-04-17 DIAGNOSIS — Z7901 Long term (current) use of anticoagulants: Secondary | ICD-10-CM | POA: Diagnosis not present

## 2019-04-17 DIAGNOSIS — R600 Localized edema: Secondary | ICD-10-CM | POA: Insufficient documentation

## 2019-04-17 DIAGNOSIS — E785 Hyperlipidemia, unspecified: Secondary | ICD-10-CM | POA: Diagnosis not present

## 2019-04-17 DIAGNOSIS — I10 Essential (primary) hypertension: Secondary | ICD-10-CM

## 2019-04-17 DIAGNOSIS — Z86718 Personal history of other venous thrombosis and embolism: Secondary | ICD-10-CM

## 2019-04-17 DIAGNOSIS — Z5181 Encounter for therapeutic drug level monitoring: Secondary | ICD-10-CM | POA: Diagnosis not present

## 2019-04-17 NOTE — Progress Notes (Signed)
9/3/20202:49 PM  Melissa Murphy 21-Jan-1936, 83 y.o., female [id number]  Chief Complaint  Patient presents with  . Fall    due to swelling of the feet, feel hit head shoulders and chest hit the table  . Hypertension    taking the lasix at 20mg  instead of the 40mg , makes her feel dizzy, also wants to know how long she takes the xarelto. Feels swelling has gotten better    HPI:   Patient is a 83 y.o. female with past medical history significant for HTN, insomnia, deaf, OSA, HLP, urge incontinence, DVT x 2 LLEwho presents today forfollowup  Last OV June 2020  ASL interpreter available today Tried to see vasc surg - did not have ASL interpreter with her, very frustrating visit for both, doppler no VTE, unable to assess reflux  First DVT over 20 years ago during cross country trip Second DVT was also due to cross country trip She plans to continue flying/driving back and forth to [place] She denies any bleeding issues  She decreased lasix to 20mg  as not tolerated 40mg  No worse swelling  She already had her flu vaccine  Continues to take ambien nightly, sleeps well, long standing therpay, denies any side effects  Lab Results  Component Value Date   CREATININE 0.71 04/02/2019   BUN 22 04/02/2019   NA 140 04/02/2019   K 4.2 04/02/2019   CL 101 04/02/2019   CO2 24 04/02/2019    Depression screen PHQ 2/9 04/02/2019 01/16/2019 01/03/2019  Decreased Interest 0 0 0  Down, Depressed, Hopeless 0 0 0  PHQ - 2 Score 0 0 0  Some recent data might be hidden    Fall Risk  04/17/2019 04/02/2019 01/16/2019 01/03/2019 01/01/2019  Falls in the past year? 1 0 0 0 0  Number falls in past yr: 0 0 0 0 0  Injury with Fall? 1 0 0 0 0  Risk for fall due to : Impaired mobility;Orthopedic patient;Other (Comment) - - - -  Follow up - - - - -     Allergies  Allergen Reactions  . Avastin [Bevacizumab] Nausea And Vomiting  . Clindamycin/Lincomycin Itching  . Novocain [Procaine] Other (See Comments)     Seizures   . Sulfa Antibiotics Other (See Comments)    Causes fevers    Prior to Admission medications   Medication Sig Start Date End Date Taking? Authorizing Provider  aspirin 81 MG chewable tablet Chew 81 mg by mouth at bedtime.    Yes [provider]  cloNIDine (CATAPRES) 0.1 MG tablet Take 1 tablet (0.1 mg total) by mouth 2 (two) times daily. 01/31/19  Yes Rutherford Guys, MD  furosemide (LASIX) 20 MG tablet TAKE 1 TABLET BY MOUTH EVERY DAY 11/22/18  Yes Rutherford Guys, MD  latanoprost (XALATAN) 0.005 % ophthalmic solution Place 1 drop into both eyes at bedtime.  08/26/18  Yes [provider]  metoprolol tartrate (LOPRESSOR) 50 MG tablet Take 1 tablet (50 mg total) by mouth 2 (two) times daily. 09/16/18  Yes Rutherford Guys, MD  potassium chloride (K-DUR) 10 MEQ tablet TAKE 1 TABLET BY MOUTH EVERY DAY 03/13/19  Yes Rutherford Guys, MD  ramipril (ALTACE) 10 MG capsule Take 1 capsule (10 mg total) by mouth 2 (two) times daily. 09/16/18  Yes Rutherford Guys, MD  rivaroxaban (XARELTO) 20 MG TABS tablet Take 1 tablet (20 mg total) by mouth daily with supper. 03/21/19  Yes Rutherford Guys, MD  simvastatin (ZOCOR) 40  MG tablet Take 0.5 tablets (20 mg total) by mouth at bedtime. 09/16/18  Yes Rutherford Guys, MD  tolterodine (DETROL LA) 4 MG 24 hr capsule TAKE 1 CAPSULE BY MOUTH EVERY DAY 03/24/19  Yes Rutherford Guys, MD  zolpidem (AMBIEN) 10 MG tablet Take 0.5-1 tablets (5-10 mg total) by mouth at bedtime as needed for sleep. 01/20/19  Yes Rutherford Guys, MD  furosemide (LASIX) 40 MG tablet Take 1 tablet (40 mg total) by mouth daily. Patient not taking: Reported on 04/17/2019 04/02/19   Posey Boyer, MD    Past Medical History:  Diagnosis Date  . Anemia   . Arthritis   . Cataract   . Clotting disorder (Needles)   . Deaf    Congenital; Requires an interpreter  . DVT (deep venous thrombosis) (HCC)    in eye, and leg  . Edema   . GERD (gastroesophageal reflux disease)   .  Glaucoma   . Hernia   . Hiatal hernia   . Hyperlipidemia   . Hypertension   . Osteoporosis   . Sleep apnea   . Thyroid disease   . Ulcer   . Urinary incontinence   . Uterine polyp     Past Surgical History:  Procedure Laterality Date  . CATARACT EXTRACTION     both eyes  . DILATATION & CURRETTAGE/HYSTEROSCOPY WITH RESECTOCOPE N/A 10/11/2012   Procedure: DILATATION & CURETTAGE/HYSTEROSCOPY WITH RESECTOCOPE;  Surgeon: Allena Katz, MD;  Location: [facility];  Service: Gynecology;  Laterality: N/A;  pt is deaf; please contact daughter Heide Spark [phone]  . EYE SURGERY    . goiter removed     age 32  . HERNIA REPAIR    . HIATAL HERNIA REPAIR N/A 12/08/2013   Procedure: LAPAROSCOPIC REPAIR OF HIATAL HERNIA  ;  Surgeon: [name], MD;  Location: [facility];  Service: General;  Laterality: N/A;  . REFRACTIVE SURGERY    . TONSILLECTOMY    . xanthoma tumor removed      Social History   Tobacco Use  . Smoking status: Never Smoker  . Smokeless tobacco: Never Used  Substance Use Topics  . Alcohol use: Yes    Alcohol/week: 0.0 standard drinks    Comment: occasionally    Family History  Problem Relation Age of Onset  . Cancer Mother 70       colon cancer  . Stroke Mother 20       cause of death  . Heart disease Mother   . Hypertension Mother   . Heart disease Father 57       AMI; cause of death  . Heart disease Brother        AMI x 2; tobacco abuse; CABG  . Hernia Brother   . Hyperlipidemia Brother   . Hypertension Brother   . Migraines Daughter   . Heart disease Paternal Grandmother   . Esophageal cancer Neg Hx   . Rectal cancer Neg Hx   . Stomach cancer Neg Hx     Review of Systems  Constitutional: Negative for chills and fever.  Respiratory: Negative for cough and shortness of breath.   Cardiovascular: Positive for leg swelling. Negative for chest pain and palpitations.  Gastrointestinal: Negative for abdominal pain, nausea and vomiting.   Endo/Heme/Allergies: Does not bruise/bleed easily.     OBJECTIVE:  Today's Vitals   04/17/19 1439  BP: 120/74  Pulse: 64  Temp: 99 F (37.2 C)  SpO2: 94%  Weight: 197  lb (89.4 kg)  Height: 5\' 8"  (1.727 m)   Body mass index is 29.95 kg/m.   Physical Exam Vitals signs and nursing note reviewed.  Constitutional:      Appearance: She is well-developed.  HENT:     Head: Normocephalic and atraumatic.     Mouth/Throat:     Pharynx: No oropharyngeal exudate.  Eyes:     General: No scleral icterus.    Conjunctiva/sclera: Conjunctivae normal.     Pupils: Pupils are equal, round, and reactive to light.  Neck:     Musculoskeletal: Neck supple.  Cardiovascular:     Rate and Rhythm: Normal rate and regular rhythm.     Heart sounds: Normal heart sounds. No murmur. No friction rub. No gallop.   Pulmonary:     Effort: Pulmonary effort is normal.     Breath sounds: Normal breath sounds. No wheezing or rales.  Musculoskeletal:     Right lower leg: Edema present.     Left lower leg: Edema present.  Skin:    General: Skin is warm and dry.  Neurological:     Mental Status: She is alert and oriented to person, place, and time.     No results found for this or any previous visit (from the past 24 hour(s)).  No results found.   ASSESSMENT and PLAN  1. Essential hypertension Controlled. Continue current regime.  - Comprehensive metabolic panel  2. History of recurrent deep vein thrombosis (DVT) Stable. Recent doppler neg DVT. Discussed r/b of cont anticoagulation given recurrent provoked DVT and patients plans for upcoming trips to [place]. Patient wants to cont with anticoag at this time. RTC precautions given. - CBC  3. Long term current use of anticoagulant therapy - CBC  4. Dyslipidemia Controlled. Continue current regime.  - Lipid panel  5. Peripheral edema Stable. Recent frustrating visit with vasc surg. Discussed use of compression stockings.  6. Medication  monitoring encounter - CBC  7. Primary insomnia Stable. Cont with Lorrin Mais  Return in about 6 months (around 10/15/2019).    Rutherford Guys, MD [facility] [place], [place] [address] Ph.  [phone] Fax [phone]

## 2019-04-17 NOTE — Patient Instructions (Signed)
° ° ° °  If you have lab work done today you will be contacted with your lab results within the next 2 weeks.  If you have not heard from us then please contact us. The fastest way to get your results is to register for My Chart. ° ° °IF you received an x-ray today, you will receive an invoice from [facility]. Please contact [facility] at [phone] with questions or concerns regarding your invoice.  ° °IF you received labwork today, you will receive an invoice from [facility]. Please contact [facility] at [phone] with questions or concerns regarding your invoice.  ° °Our billing staff will not be able to assist you with questions regarding bills from these companies. ° °You will be contacted with the lab results as soon as they are available. The fastest way to get your results is to activate your My Chart account. Instructions are located on the last page of this paperwork. If you have not heard from us regarding the results in 2 weeks, please contact this office. °  ° ° ° °

## 2019-04-18 LAB — LIPID PANEL
Chol/HDL Ratio: 2.9 ratio (ref 0.0–4.4)
Cholesterol, Total: 155 mg/dL (ref 100–199)
HDL: 53 mg/dL (ref >39)
LDL Chol Calc (NIH): 78 mg/dL (ref 0–99)
Triglycerides: 136 mg/dL (ref 0–149)
VLDL Cholesterol Cal: 24 mg/dL (ref 5–40)

## 2019-04-18 LAB — CBC
Hematocrit: 40 % (ref 34.0–46.6)
Hemoglobin: 13.1 g/dL (ref 11.1–15.9)
MCH: 31.3 pg (ref 26.6–33.0)
MCHC: 32.8 g/dL (ref 31.5–35.7)
MCV: 96 fL (ref 79–97)
Platelets: 238 10*3/uL (ref 150–450)
RBC: 4.19 x10E6/uL (ref 3.77–5.28)
RDW: 12.4 % (ref 11.7–15.4)
WBC: 7 10*3/uL (ref 3.4–10.8)

## 2019-04-18 LAB — COMPREHENSIVE METABOLIC PANEL
ALT: 16 IU/L (ref 0–32)
AST: 21 IU/L (ref 0–40)
Albumin/Globulin Ratio: 1.7 (ref 1.2–2.2)
Albumin: 4.2 g/dL (ref 3.7–4.7)
Alkaline Phosphatase: 70 IU/L (ref 39–117)
BUN/Creatinine Ratio: 34 — ABNORMAL HIGH (ref 12–28)
BUN: 23 mg/dL (ref 8–27)
Bilirubin Total: 0.3 mg/dL (ref 0.0–1.2)
CO2: 23 mmol/L (ref 20–29)
Calcium: 9.5 mg/dL (ref 8.7–10.3)
Chloride: 103 mmol/L (ref 96–106)
Creatinine, Ser: 0.67 mg/dL (ref 0.57–1.00)
GFR calc Af Amer: 96 mL/min/{1.73_m2} (ref >59)
GFR calc non Af Amer: 83 mL/min/{1.73_m2} (ref >59)
Globulin, Total: 2.5 g/dL (ref 1.5–4.5)
Glucose: 98 mg/dL (ref 65–99)
Potassium: 4.1 mmol/L (ref 3.5–5.2)
Sodium: 143 mmol/L (ref 134–144)
Total Protein: 6.7 g/dL (ref 6.0–8.5)

## 2019-05-01 ENCOUNTER — Other Ambulatory Visit: Payer: Self-pay | Admitting: Family Medicine

## 2019-05-05 ENCOUNTER — Ambulatory Visit: Payer: Medicare Other | Admitting: Family Medicine

## 2019-05-06 ENCOUNTER — Encounter: Payer: Self-pay | Admitting: Family Medicine

## 2019-05-12 ENCOUNTER — Ambulatory Visit (INDEPENDENT_AMBULATORY_CARE_PROVIDER_SITE_OTHER): Payer: Medicare Other | Admitting: Family Medicine

## 2019-05-12 ENCOUNTER — Encounter: Payer: Self-pay | Admitting: Family Medicine

## 2019-05-12 ENCOUNTER — Other Ambulatory Visit: Payer: Self-pay

## 2019-05-12 VITALS — BP 128/70 | HR 76 | Temp 98.9°F | Ht 64.0 in | Wt 195.4 lb

## 2019-05-12 DIAGNOSIS — I10 Essential (primary) hypertension: Secondary | ICD-10-CM | POA: Diagnosis not present

## 2019-05-12 DIAGNOSIS — R269 Unspecified abnormalities of gait and mobility: Secondary | ICD-10-CM

## 2019-05-12 NOTE — Progress Notes (Signed)
9/28/20202:33 PM  Melissa Murphy 18-Dec-1935, 83 y.o., female [id number]  Chief Complaint  Patient presents with  . Hypertension  . Leg Swelling    HPI:   Patient is a 83 y.o. female with past medical history significant for HTN, insomnia, deaf, OSA, HLP, urge incontinence, DVT x 2 LLEwho presents today forfollowup  Last OV sept 2020 - no changes, f/u 6 months Patient here requesting referral to PT, her balance and ability to walk has deteriorated during covid Feels unsteady and unsafe Wonders if she needs a rollator, uses a cane now  Has not taken BP meds today  ASL interpreter present  Wt Readings from Last 3 Encounters:  05/12/19 195 lb 6.4 oz (88.6 kg)  04/17/19 197 lb (89.4 kg)  04/15/19 195 lb (88.5 kg)    Depression screen [facility] 2/9 05/12/2019 04/02/2019 01/16/2019  Decreased Interest 0 0 0  Down, Depressed, Hopeless 0 0 0  PHQ - 2 Score 0 0 0  Some recent data might be hidden    Fall Risk  05/12/2019 04/17/2019 04/02/2019 01/16/2019 01/03/2019  Falls in the past year? 0 1 0 0 0  Number falls in past yr: 0 0 0 0 0  Injury with Fall? 0 1 0 0 0  Risk for fall due to : - Impaired mobility;Orthopedic patient;Other (Comment) - - -  Follow up Falls evaluation completed - - - -     Allergies  Allergen Reactions  . Avastin [Bevacizumab] Nausea And Vomiting  . Clindamycin/Lincomycin Itching  . Novocain [Procaine] Other (See Comments)    Seizures   . Sulfa Antibiotics Other (See Comments)    Causes fevers    Prior to Admission medications   Medication Sig Start Date End Date Taking? Authorizing Provider  cloNIDine (CATAPRES) 0.1 MG tablet Take 1 tablet (0.1 mg total) by mouth 2 (two) times daily. 01/31/19  Yes Rutherford Guys, MD  furosemide (LASIX) 20 MG tablet TAKE 1 TABLET BY MOUTH EVERY DAY 11/22/18  Yes Rutherford Guys, MD  latanoprost (XALATAN) 0.005 % ophthalmic solution Place 1 drop into both eyes at bedtime.  08/26/18  Yes [provider]   metoprolol tartrate (LOPRESSOR) 50 MG tablet Take 1 tablet (50 mg total) by mouth 2 (two) times daily. 09/16/18  Yes Rutherford Guys, MD  potassium chloride (K-DUR) 10 MEQ tablet TAKE 1 TABLET BY MOUTH EVERY DAY 05/01/19  Yes Rutherford Guys, MD  ramipril (ALTACE) 10 MG capsule Take 1 capsule (10 mg total) by mouth 2 (two) times daily. 09/16/18  Yes Rutherford Guys, MD  rivaroxaban (XARELTO) 20 MG TABS tablet Take 1 tablet (20 mg total) by mouth daily with supper. 03/21/19  Yes Rutherford Guys, MD  simvastatin (ZOCOR) 40 MG tablet Take 0.5 tablets (20 mg total) by mouth at bedtime. 09/16/18  Yes Rutherford Guys, MD  tolterodine (DETROL LA) 4 MG 24 hr capsule TAKE 1 CAPSULE BY MOUTH EVERY DAY 03/24/19  Yes Rutherford Guys, MD  zolpidem (AMBIEN) 10 MG tablet Take 0.5-1 tablets (5-10 mg total) by mouth at bedtime as needed for sleep. 01/20/19  Yes Rutherford Guys, MD  aspirin 81 MG chewable tablet Chew 81 mg by mouth at bedtime.     [provider]    Past Medical History:  Diagnosis Date  . Anemia   . Arthritis   . Cataract   . Clotting disorder ([place])   . Deaf    Congenital; Requires an interpreter  . DVT (  deep venous thrombosis) ([facility])    in eye, and leg  . Edema   . GERD (gastroesophageal reflux disease)   . Glaucoma   . Hernia   . Hiatal hernia   . Hyperlipidemia   . Hypertension   . Osteoporosis   . Sleep apnea   . Thyroid disease   . Ulcer   . Urinary incontinence   . Uterine polyp     Past Surgical History:  Procedure Laterality Date  . CATARACT EXTRACTION     both eyes  . DILATATION & CURRETTAGE/HYSTEROSCOPY WITH RESECTOCOPE N/A 10/11/2012   Procedure: DILATATION & CURETTAGE/HYSTEROSCOPY WITH RESECTOCOPE;  Surgeon: Allena Katz, MD;  Location: [facility];  Service: Gynecology;  Laterality: N/A;  pt is deaf; please contact daughter Heide Spark [phone]  . EYE SURGERY    . goiter removed     age 78  . HERNIA REPAIR    . HIATAL HERNIA REPAIR N/A 12/08/2013    Procedure: LAPAROSCOPIC REPAIR OF HIATAL HERNIA  ;  Surgeon: [name], MD;  Location: [facility];  Service: General;  Laterality: N/A;  . REFRACTIVE SURGERY    . TONSILLECTOMY    . xanthoma tumor removed      Social History   Tobacco Use  . Smoking status: Never Smoker  . Smokeless tobacco: Never Used  Substance Use Topics  . Alcohol use: Yes    Alcohol/week: 0.0 standard drinks    Comment: occasionally    Family History  Problem Relation Age of Onset  . Cancer Mother 61       colon cancer  . Stroke Mother 52       cause of death  . Heart disease Mother   . Hypertension Mother   . Heart disease Father 44       AMI; cause of death  . Heart disease Brother        AMI x 2; tobacco abuse; CABG  . Hernia Brother   . Hyperlipidemia Brother   . Hypertension Brother   . Migraines Daughter   . Heart disease Paternal Grandmother   . Esophageal cancer Neg Hx   . Rectal cancer Neg Hx   . Stomach cancer Neg Hx     Review of Systems  Constitutional: Negative for chills and fever.  Respiratory: Negative for cough and shortness of breath.   Cardiovascular: Positive for leg swelling. Negative for chest pain and palpitations.  Gastrointestinal: Negative for abdominal pain, nausea and vomiting.  Neurological: Positive for tingling.   Per hpi  OBJECTIVE:  Today's Vitals   05/12/19 1427 05/12/19 1431  BP: (!) 172/68 128/70  Pulse: 76   Temp: 98.9 F (37.2 C)   TempSrc: Oral   SpO2: 93%   Weight: 195 lb 6.4 oz (88.6 kg)   Height: 5\' 4"  (1.626 m)    Body mass index is 33.54 kg/m.  BP Readings from Last 3 Encounters:  05/12/19 128/70  04/17/19 120/74  04/15/19 132/70    Physical Exam Vitals signs and nursing note reviewed.  Constitutional:      Appearance: She is well-developed.  HENT:     Head: Normocephalic and atraumatic.     Mouth/Throat:     Pharynx: No oropharyngeal exudate.  Eyes:     General: No scleral icterus.    Conjunctiva/sclera: Conjunctivae  normal.     Pupils: Pupils are equal, round, and reactive to light.  Neck:     Musculoskeletal: Neck supple.  Cardiovascular:  Rate and Rhythm: Normal rate and regular rhythm.     Heart sounds: Normal heart sounds. No murmur. No friction rub. No gallop.   Pulmonary:     Effort: Pulmonary effort is normal.     Breath sounds: Normal breath sounds. No wheezing or rales.  Musculoskeletal:     Right lower leg: Edema present.     Left lower leg: Edema present.  Skin:    General: Skin is warm and dry.  Neurological:     Mental Status: She is alert and oriented to person, place, and time.     No results found for this or any previous visit (from the past 24 hour(s)).  No results found.   ASSESSMENT and PLAN  1. Abnormality of gait - Ambulatory referral to Physical Therapy  2. Essential hypertension Elevated, off medications   Return for as scheduled.    Rutherford Guys, MD Primary Care at [facility], [place] [address] Ph.  [phone] Fax [phone]

## 2019-05-12 NOTE — Patient Instructions (Signed)
° ° ° °  If you have lab work done today you will be contacted with your lab results within the next 2 weeks.  If you have not heard from us then please contact us. The fastest way to get your results is to register for My Chart. ° ° °IF you received an x-ray today, you will receive an invoice from [facility]. Please contact [facility] at [phone] with questions or concerns regarding your invoice.  ° °IF you received labwork today, you will receive an invoice from [facility]. Please contact [facility] at [phone] with questions or concerns regarding your invoice.  ° °Our billing staff will not be able to assist you with questions regarding bills from these companies. ° °You will be contacted with the lab results as soon as they are available. The fastest way to get your results is to activate your My Chart account. Instructions are located on the last page of this paperwork. If you have not heard from us regarding the results in 2 weeks, please contact this office. °  ° ° ° °

## 2019-05-16 DIAGNOSIS — R350 Frequency of micturition: Secondary | ICD-10-CM | POA: Diagnosis not present

## 2019-05-16 DIAGNOSIS — N3946 Mixed incontinence: Secondary | ICD-10-CM | POA: Diagnosis not present

## 2019-05-22 ENCOUNTER — Encounter: Payer: Self-pay | Admitting: Gynecology

## 2019-05-22 ENCOUNTER — Other Ambulatory Visit: Payer: Self-pay | Admitting: Family Medicine

## 2019-05-22 DIAGNOSIS — G4700 Insomnia, unspecified: Secondary | ICD-10-CM

## 2019-05-22 NOTE — Telephone Encounter (Signed)
Medication Refill - Medication: zolpidem (AMBIEN) 10 MG tablet    Has the patient contacted their pharmacy? Yes.   (Agent: If no, request that the patient contact the pharmacy for the refill.) (Agent: If yes, when and what did the pharmacy advise?)  Preferred Pharmacy (with phone number or street name):  [id number] [address], [place] [phone] (Phone) [phone] (Fax)     Agent: Please be advised that RX refills may take up to 3 business days. We ask that you follow-up with your pharmacy.

## 2019-05-22 NOTE — Telephone Encounter (Signed)
Requested medication (s) are due for refill today: yes  Requested medication (s) are on the active medication list: yes  Last refill:  04/22/2019  Future visit scheduled: yes  Notes to clinic:  Refill cannot be delegated  Requested Prescriptions  Pending Prescriptions Disp Refills   zolpidem (AMBIEN) 10 MG tablet [Pharmacy Med Name: ZOLPIDEM TARTRATE 10 MG TABLET] 30 tablet 2    Sig: Take 0.5-1 tablets (5-10 mg total) by mouth at bedtime as needed for sleep.     Not Delegated - Psychiatry:  Anxiolytics/Hypnotics Failed - 05/22/2019  9:46 AM      Failed - This refill cannot be delegated      Failed - Urine Drug Screen completed in last 360 days.      Passed - Valid encounter within last 6 months    Recent Outpatient Visits          1 week ago Abnormality of gait   [facility], Lilia Argue, MD   1 month ago Essential hypertension   [facility], Lilia Argue, MD   1 month ago Need for influenza vaccination   [facility] at [facility], Fenton Malling, MD   4 months ago Traumatic hematoma   [facility], Lilia Argue, MD   4 months ago Traumatic hematoma   [facility], Lilia Argue, MD      Future Appointments            In 4 months Rutherford Guys, MD Primary Care at [place], [facility]

## 2019-05-23 DIAGNOSIS — R350 Frequency of micturition: Secondary | ICD-10-CM | POA: Diagnosis not present

## 2019-05-30 DIAGNOSIS — R350 Frequency of micturition: Secondary | ICD-10-CM | POA: Diagnosis not present

## 2019-06-06 DIAGNOSIS — R350 Frequency of micturition: Secondary | ICD-10-CM | POA: Diagnosis not present

## 2019-06-09 DIAGNOSIS — H31011 Macula scars of posterior pole (postinflammatory) (post-traumatic), right eye: Secondary | ICD-10-CM | POA: Diagnosis not present

## 2019-06-09 DIAGNOSIS — Z961 Presence of intraocular lens: Secondary | ICD-10-CM | POA: Diagnosis not present

## 2019-06-09 DIAGNOSIS — H40053 Ocular hypertension, bilateral: Secondary | ICD-10-CM | POA: Diagnosis not present

## 2019-06-11 ENCOUNTER — Other Ambulatory Visit: Payer: Self-pay | Admitting: Family Medicine

## 2019-06-11 DIAGNOSIS — R0989 Other specified symptoms and signs involving the circulatory and respiratory systems: Secondary | ICD-10-CM

## 2019-06-11 MED ORDER — FUROSEMIDE 20 MG PO TABS: 20.0000 mg | ORAL_TABLET | Freq: Every day | ORAL | 1 refills | Status: DC

## 2019-06-11 NOTE — Telephone Encounter (Signed)
Medication Refill - Medication:  furosemide (LASIX) 20 MG tablet  Has the patient contacted their pharmacy? Yes.   (Agent: If no, request that the patient contact the pharmacy for the refill.) (Agent: If yes, when and what did the pharmacy advise?)  Preferred Pharmacy (with phone number or street name):  [id number] [place], [place]  [place] [address]  Phone: [phone] Fax: [phone]     Agent: Please be advised that RX refills may take up to 3 business days. We ask that you follow-up with your pharmacy.

## 2019-06-20 DIAGNOSIS — R350 Frequency of micturition: Secondary | ICD-10-CM | POA: Diagnosis not present

## 2019-06-25 ENCOUNTER — Other Ambulatory Visit: Payer: Self-pay

## 2019-06-25 DIAGNOSIS — R600 Localized edema: Secondary | ICD-10-CM

## 2019-06-30 ENCOUNTER — Telehealth: Payer: Self-pay | Admitting: Family Medicine

## 2019-06-30 NOTE — Telephone Encounter (Signed)
LVM to r/s appt for 10/15/2019 with Dr. Pamella Pert. Provider will be out of the office on that day

## 2019-07-01 ENCOUNTER — Ambulatory Visit: Payer: Medicare Other | Admitting: Vascular Surgery

## 2019-07-01 ENCOUNTER — Encounter (HOSPITAL_COMMUNITY): Payer: Medicare Other

## 2019-07-14 ENCOUNTER — Telehealth: Payer: Self-pay | Admitting: Family Medicine

## 2019-07-14 DIAGNOSIS — G4700 Insomnia, unspecified: Secondary | ICD-10-CM

## 2019-07-14 NOTE — Telephone Encounter (Signed)
Patient requesting zolpidem (AMBIEN) 10 MG tablet , informed please allow 48 to 72 hour turn, patient states she's completley out and would like a follow up call today regarding the status.   [id number] [place],  - [place]

## 2019-07-23 ENCOUNTER — Telehealth: Payer: Self-pay | Admitting: Family Medicine

## 2019-07-23 NOTE — Telephone Encounter (Signed)
Pt called and is wanting a call back when the [place] vaccine is available FR

## 2019-07-29 MED ORDER — ZOLPIDEM TARTRATE 10 MG PO TABS: 5.0000 mg | ORAL_TABLET | Freq: Every evening | ORAL | 2 refills | Status: DC | PRN

## 2019-07-29 NOTE — Telephone Encounter (Signed)
Pmp reviewed Med refilled

## 2019-07-29 NOTE — Addendum Note (Signed)
Addended by: Rutherford Guys on: 07/29/2019 02:12 PM   Modules accepted: Orders

## 2019-08-12 ENCOUNTER — Encounter (HOSPITAL_COMMUNITY): Payer: Medicare Other

## 2019-08-12 ENCOUNTER — Ambulatory Visit: Payer: Medicare Other | Admitting: Vascular Surgery

## 2019-08-16 ENCOUNTER — Other Ambulatory Visit: Payer: Self-pay | Admitting: Family Medicine

## 2019-08-16 DIAGNOSIS — I160 Hypertensive urgency: Secondary | ICD-10-CM

## 2019-08-19 DIAGNOSIS — M25561 Pain in right knee: Secondary | ICD-10-CM | POA: Diagnosis not present

## 2019-08-19 DIAGNOSIS — M1711 Unilateral primary osteoarthritis, right knee: Secondary | ICD-10-CM | POA: Diagnosis not present

## 2019-08-26 ENCOUNTER — Encounter: Payer: Self-pay | Admitting: Family Medicine

## 2019-08-26 ENCOUNTER — Other Ambulatory Visit: Payer: Self-pay

## 2019-08-26 ENCOUNTER — Ambulatory Visit (INDEPENDENT_AMBULATORY_CARE_PROVIDER_SITE_OTHER): Payer: Medicare Other | Admitting: Family Medicine

## 2019-08-26 VITALS — BP 135/82 | HR 68 | Temp 98.0°F | Resp 17 | Ht 64.0 in | Wt 194.6 lb

## 2019-08-26 DIAGNOSIS — H9193 Unspecified hearing loss, bilateral: Secondary | ICD-10-CM

## 2019-08-26 DIAGNOSIS — R300 Dysuria: Secondary | ICD-10-CM | POA: Diagnosis not present

## 2019-08-26 LAB — POCT URINALYSIS DIP (MANUAL ENTRY)
Bilirubin, UA: NEGATIVE
Glucose, UA: NEGATIVE mg/dL
Ketones, POC UA: NEGATIVE mg/dL
Leukocytes, UA: NEGATIVE
Nitrite, UA: NEGATIVE
Protein Ur, POC: NEGATIVE mg/dL
Spec Grav, UA: 1.025 (ref 1.010–1.025)
Urobilinogen, UA: 0.2 E.U./dL
pH, UA: 5.5 (ref 5.0–8.0)

## 2019-08-26 NOTE — Progress Notes (Signed)
Patient ID: Yameli Trulock, female    DOB: 1935/09/22  Age: 84 y.o. MRN: [id number]  Chief Complaint  Patient presents with  . Urinary Frequency    leakage and cloudy, ongoing since last visit.    Subjective:   Since she was here several months ago the patient has had more problems with urinary frequency and leakage.  Now she is seeing some cloudy urine.  She does not have dysuria.  No fevers.  No flank pain.  No abdominal pain.  She has thought that she might have a urinary tract infection which she has had in the past.  She has been to urologist in the past about this, and after she gets her Covid shot she will go back to them again.  Current allergies, medications, problem list, past/family and social histories reviewed.  Objective:  BP 135/82 (BP Location: Right Arm, Patient Position: Sitting, Cuff Size: Normal)   Pulse 68   Temp 98 F (36.7 C) (Oral)   Resp 17   Ht 5\' 4"  (1.626 m)   Wt 194 lb 9.6 oz (88.3 kg)   SpO2 93%   BMI 33.40 kg/m   No acute distress.  No CVA tenderness.  Abdomen soft without masses or tenderness.  She is deaf and has an interpreter with her.  She has not been able to void yet, but will try again to get a sample for [place].  Assessment & Plan:   Assessment: 1. Dysuria   2. Bilateral deafness       Plan: No obvious infection on urine.  We will culture any help to be certain that she is noted the cloudiness of the urine at times.  Told her if she keeps having symptoms she needs to follow-up with her urologist which she does not want to do until she has her Covid vaccine.  Orders Placed This Encounter  Procedures  . Urine culture  . POCT urine dipstick    No orders of the defined types were placed in this encounter.        Patient Instructions    Continue to drink plenty of fluids.  If you get worse get rechecked.  We will let you know the results of the urine culture if needed.  If you note any blood when you wipe yourself or on your  clothing you should get examined again.  Let us know if problems arise   If you have lab work done today you will be contacted with your lab results within the next 2 weeks.  If you have not heard from [place] then please contact us. The fastest way to get your results is to register for My Chart.   IF you received an x-ray today, you will receive an invoice from [facility]. Please contact [facility] at [phone] with questions or concerns regarding your invoice.   IF you received labwork today, you will receive an invoice from [facility]. Please contact [facility] at [phone] with questions or concerns regarding your invoice.   Our billing staff will not be able to assist you with questions regarding bills from these companies.  You will be contacted with the lab results as soon as they are available. The fastest way to get your results is to activate your My Chart account. Instructions are located on the last page of this paperwork. If you have not heard from [place] regarding the results in 2 weeks, please contact this office.        Return if symptoms worsen  or fail to improve.   Ruben Reason, MD 08/26/2019

## 2019-08-26 NOTE — Patient Instructions (Addendum)
  Continue to drink plenty of fluids.  If you get worse get rechecked.  We will let you know the results of the urine culture if needed.  If you note any blood when you wipe yourself or on your clothing you should get examined again.  Let us know if problems arise   If you have lab work done today you will be contacted with your lab results within the next 2 weeks.  If you have not heard from [place] then please contact us. The fastest way to get your results is to register for My Chart.   IF you received an x-ray today, you will receive an invoice from [facility]. Please contact [facility] at [phone] with questions or concerns regarding your invoice.   IF you received labwork today, you will receive an invoice from [facility]. Please contact [facility] at [phone] with questions or concerns regarding your invoice.   Our billing staff will not be able to assist you with questions regarding bills from these companies.  You will be contacted with the lab results as soon as they are available. The fastest way to get your results is to activate your My Chart account. Instructions are located on the last page of this paperwork. If you have not heard from [place] regarding the results in 2 weeks, please contact this office.

## 2019-08-27 LAB — URINE CULTURE

## 2019-08-29 ENCOUNTER — Encounter: Payer: Self-pay | Admitting: Radiology

## 2019-09-05 ENCOUNTER — Other Ambulatory Visit: Payer: Self-pay | Admitting: Family Medicine

## 2019-09-07 ENCOUNTER — Other Ambulatory Visit: Payer: Self-pay | Admitting: Family Medicine

## 2019-09-07 ENCOUNTER — Ambulatory Visit: Payer: Medicare Other

## 2019-09-07 DIAGNOSIS — I10 Essential (primary) hypertension: Secondary | ICD-10-CM

## 2019-09-16 ENCOUNTER — Ambulatory Visit: Payer: Medicare Other | Admitting: Vascular Surgery

## 2019-09-16 ENCOUNTER — Encounter (HOSPITAL_COMMUNITY): Payer: Medicare Other

## 2019-09-17 ENCOUNTER — Other Ambulatory Visit: Payer: Self-pay | Admitting: Family Medicine

## 2019-09-17 DIAGNOSIS — E785 Hyperlipidemia, unspecified: Secondary | ICD-10-CM

## 2019-09-17 DIAGNOSIS — I10 Essential (primary) hypertension: Secondary | ICD-10-CM

## 2019-09-17 NOTE — Telephone Encounter (Signed)
Forwarding medication refill requests to the clinical pool for review. 

## 2019-10-09 ENCOUNTER — Ambulatory Visit: Payer: Medicare Other | Attending: Internal Medicine

## 2019-10-09 DIAGNOSIS — Z23 Encounter for immunization: Secondary | ICD-10-CM | POA: Insufficient documentation

## 2019-10-09 NOTE — Progress Notes (Signed)
   Covid-19 Vaccination Clinic  Name:  Dashana Groover    MRN: [id number] DOB: Sep 07, 1935  10/09/2019  Ms. Whisler was observed post Covid-19 immunization for 30 minutes based on pre-vaccination screening without incidence. She was provided with Vaccine Information Sheet and instruction to access the V-Safe system.   Ms. Mottola was instructed to call 911 with any severe reactions post vaccine: Marland Kitchen Difficulty breathing  . Swelling of your face and throat  . A fast heartbeat  . A bad rash all over your body  . Dizziness and weakness    Immunizations Administered    Name Date Dose VIS Date Route   Pfizer COVID-19 Vaccine 10/09/2019  4:25 PM 0.3 mL 07/25/2019 Intramuscular   Manufacturer: [place]   Lot: [id number]   [place]: [id number]

## 2019-10-15 ENCOUNTER — Ambulatory Visit: Payer: Medicare Other | Admitting: Family Medicine

## 2019-10-16 ENCOUNTER — Other Ambulatory Visit: Payer: Self-pay

## 2019-10-16 ENCOUNTER — Ambulatory Visit (INDEPENDENT_AMBULATORY_CARE_PROVIDER_SITE_OTHER): Payer: Medicare Other | Admitting: Family Medicine

## 2019-10-16 ENCOUNTER — Encounter: Payer: Self-pay | Admitting: Family Medicine

## 2019-10-16 VITALS — BP 116/72 | HR 78 | Temp 98.1°F | Ht 64.0 in | Wt 195.8 lb

## 2019-10-16 DIAGNOSIS — Z86718 Personal history of other venous thrombosis and embolism: Secondary | ICD-10-CM

## 2019-10-16 DIAGNOSIS — I10 Essential (primary) hypertension: Secondary | ICD-10-CM

## 2019-10-16 DIAGNOSIS — H9193 Unspecified hearing loss, bilateral: Secondary | ICD-10-CM | POA: Diagnosis not present

## 2019-10-16 DIAGNOSIS — E785 Hyperlipidemia, unspecified: Secondary | ICD-10-CM | POA: Diagnosis not present

## 2019-10-16 DIAGNOSIS — G4700 Insomnia, unspecified: Secondary | ICD-10-CM | POA: Diagnosis not present

## 2019-10-16 DIAGNOSIS — Z7901 Long term (current) use of anticoagulants: Secondary | ICD-10-CM | POA: Diagnosis not present

## 2019-10-16 MED ORDER — METOPROLOL TARTRATE 50 MG PO TABS: 50.0000 mg | ORAL_TABLET | Freq: Two times a day (BID) | ORAL | 3 refills | Status: DC

## 2019-10-16 MED ORDER — FUROSEMIDE 20 MG PO TABS: 20.0000 mg | ORAL_TABLET | Freq: Every day | ORAL | 1 refills | Status: DC

## 2019-10-16 MED ORDER — SIMVASTATIN 20 MG PO TABS: ORAL_TABLET | ORAL | 3 refills | Status: DC

## 2019-10-16 MED ORDER — RIVAROXABAN 20 MG PO TABS: ORAL_TABLET | ORAL | 1 refills | Status: DC

## 2019-10-16 MED ORDER — RAMIPRIL 10 MG PO CAPS: 10.0000 mg | ORAL_CAPSULE | Freq: Two times a day (BID) | ORAL | 3 refills | Status: DC

## 2019-10-16 MED ORDER — ZOLPIDEM TARTRATE 10 MG PO TABS: 5.0000 mg | ORAL_TABLET | Freq: Every evening | ORAL | 5 refills | Status: DC | PRN

## 2019-10-16 NOTE — Progress Notes (Signed)
3/4/20216:52 PM  Melissa Murphy 1936/06/22, 84 y.o., female [id number]  Chief Complaint  Patient presents with  . Hypertension    swelling in th feet  . Edema    HPI:   Patient is a 84 y.o. female with past medical history significant for HTN, insomnia, deaf, OSA, HLP, urge incontinence, DVTx 2LLEwho presents today forfollowup  Last OV sept 2020  Saw ophtho and urology Scheduled for vascular venous study later this month  She is wondering about needing to be on xeralto long term after having DVT x 2, both after cross couintry car drives, last one Jan 2020 She thinks that first DVT was about 15 years ago She reports difficult to control INR on warfarin She does not need refill of KCL at this time, but does need refill of other meds pmp reviewed  ASL interpreter present  Depression screen [facility] 2/9 08/26/2019 05/12/2019 04/02/2019  Decreased Interest 0 0 0  Down, Depressed, Hopeless 0 0 0  PHQ - 2 Score 0 0 0  Some recent data might be hidden    Fall Risk  10/16/2019 08/26/2019 05/12/2019 04/17/2019 04/02/2019  Falls in the past year? 0 0 0 1 0  Number falls in past yr: 0 0 0 0 0  Injury with Fall? 0 0 0 1 0  Risk for fall due to : Impaired mobility;Impaired balance/gait - - Impaired mobility;Orthopedic patient;Other (Comment) -  Follow up - Falls evaluation completed Falls evaluation completed - -     Allergies  Allergen Reactions  . Avastin [Bevacizumab] Nausea And Vomiting  . Clindamycin/Lincomycin Itching  . Novocain [Procaine] Other (See Comments)    Seizures   . Sulfa Antibiotics Other (See Comments)    Causes fevers    Prior to Admission medications   Medication Sig Start Date End Date Taking? Authorizing Provider  cloNIDine (CATAPRES) 0.1 MG tablet TAKE 1 TABLET (0.1 MG TOTAL) BY MOUTH 2 (TWO) TIMES DAILY. 08/17/19  Yes Rutherford Guys, MD  furosemide (LASIX) 20 MG tablet Take 1 tablet (20 mg total) by mouth daily. 10/16/19  Yes Rutherford Guys, MD    latanoprost (XALATAN) 0.005 % ophthalmic solution Place 1 drop into both eyes at bedtime.  08/26/18  Yes [provider]  metoprolol tartrate (LOPRESSOR) 50 MG tablet Take 1 tablet (50 mg total) by mouth 2 (two) times daily. 10/16/19  Yes Rutherford Guys, MD  potassium chloride (KLOR-CON) 10 MEQ tablet TAKE 1 TABLET BY MOUTH EVERY DAY 09/05/19  Yes Rutherford Guys, MD  ramipril (ALTACE) 10 MG capsule Take 1 capsule (10 mg total) by mouth 2 (two) times daily. 10/16/19  Yes Rutherford Guys, MD  rivaroxaban (XARELTO) 20 MG TABS tablet TAKE 1 TABLET (20 MG TOTAL) BY MOUTH DAILY WITH SUPPER. 10/16/19  Yes Rutherford Guys, MD  simvastatin (ZOCOR) 20 MG tablet TAKE 1 TABLET (20 MG TOTAL) BY MOUTH AT BEDTIME. 10/16/19  Yes Rutherford Guys, MD  tolterodine (DETROL LA) 4 MG 24 hr capsule TAKE 1 CAPSULE BY MOUTH EVERY DAY 09/05/19  Yes Rutherford Guys, MD  zolpidem (AMBIEN) 10 MG tablet Take 0.5-1 tablets (5-10 mg total) by mouth at bedtime as needed for sleep. 10/16/19  Yes Rutherford Guys, MD    Past Medical History:  Diagnosis Date  . Anemia   . Arthritis   . Cataract   . Clotting disorder ([place])   . Deaf    Congenital; Requires an interpreter  . DVT (deep venous thrombosis) ([place])  in eye, and leg  . Edema   . GERD (gastroesophageal reflux disease)   . Glaucoma   . Hernia   . Hiatal hernia   . Hyperlipidemia   . Hypertension   . Osteoporosis   . Sleep apnea   . Thyroid disease   . Ulcer   . Urinary incontinence   . Uterine polyp     Past Surgical History:  Procedure Laterality Date  . CATARACT EXTRACTION     both eyes  . DILATATION & CURRETTAGE/HYSTEROSCOPY WITH RESECTOCOPE N/A 10/11/2012   Procedure: DILATATION & CURETTAGE/HYSTEROSCOPY WITH RESECTOCOPE;  Surgeon: Allena Katz, MD;  Location: [facility];  Service: Gynecology;  Laterality: N/A;  pt is deaf; please contact daughter Heide Spark [phone]  . EYE SURGERY    . goiter removed     age 65  . HERNIA REPAIR    .  HIATAL HERNIA REPAIR N/A 12/08/2013   Procedure: LAPAROSCOPIC REPAIR OF HIATAL HERNIA  ;  Surgeon: [name], MD;  Location: [facility];  Service: General;  Laterality: N/A;  . REFRACTIVE SURGERY    . TONSILLECTOMY    . xanthoma tumor removed      Social History   Tobacco Use  . Smoking status: Never Smoker  . Smokeless tobacco: Never Used  Substance Use Topics  . Alcohol use: Yes    Alcohol/week: 0.0 standard drinks    Comment: occasionally    Family History  Problem Relation Age of Onset  . Cancer Mother 61       colon cancer  . Stroke Mother 51       cause of death  . Heart disease Mother   . Hypertension Mother   . Heart disease Father 56       AMI; cause of death  . Heart disease Brother        AMI x 2; tobacco abuse; CABG  . Hernia Brother   . Hyperlipidemia Brother   . Hypertension Brother   . Migraines Daughter   . Heart disease Paternal Grandmother   . Esophageal cancer Neg Hx   . Rectal cancer Neg Hx   . Stomach cancer Neg Hx     Review of Systems  Constitutional: Negative for chills and fever.  Respiratory: Negative for cough and shortness of breath.   Cardiovascular: Positive for leg swelling. Negative for chest pain and palpitations.  Gastrointestinal: Negative for abdominal pain, nausea and vomiting.  Endo/Heme/Allergies: Does not bruise/bleed easily.     OBJECTIVE:  Today's Vitals   10/16/19 1512  BP: 116/72  Pulse: 78  Temp: 98.1 F (36.7 C)  SpO2: 96%  Weight: 195 lb 12.8 oz (88.8 kg)  Height: 5\' 4"  (1.626 m)   Body mass index is 33.61 kg/m.   Physical Exam Vitals and nursing note reviewed.  Constitutional:      Appearance: She is well-developed.  HENT:     Head: Normocephalic and atraumatic.     Mouth/Throat:     Pharynx: No oropharyngeal exudate.  Eyes:     General: No scleral icterus.    Conjunctiva/sclera: Conjunctivae normal.     Pupils: Pupils are equal, round, and reactive to light.  Cardiovascular:     Rate and  Rhythm: Normal rate and regular rhythm.     Heart sounds: Normal heart sounds. No murmur. No friction rub. No gallop.   Pulmonary:     Effort: Pulmonary effort is normal.     Breath sounds: Normal breath sounds. No wheezing,  rhonchi or rales.  Musculoskeletal:     Cervical back: Neck supple.     Right lower leg: Edema present.     Left lower leg: Edema present.  Skin:    General: Skin is warm and dry.  Neurological:     Mental Status: She is alert and oriented to person, place, and time.     No results found for this or any previous visit (from the past 24 hour(s)).  No results found.   ASSESSMENT and PLAN  1. Essential hypertension Controlled. Continue current regime.  - metoprolol tartrate (LOPRESSOR) 50 MG tablet; Take 1 tablet (50 mg total) by mouth 2 (two) times daily. - ramipril (ALTACE) 10 MG capsule; Take 1 capsule (10 mg total) by mouth 2 (two) times daily. - Comprehensive metabolic panel  2. Dyslipidemia Checking labs today, medications will be adjusted as needed.  - simvastatin (ZOCOR) 20 MG tablet; TAKE 1 TABLET (20 MG TOTAL) BY MOUTH AT BEDTIME. - Lipid panel  3. Insomnia, unspecified type Stable. cont ambien, reviewed r/se/b - zolpidem (AMBIEN) 10 MG tablet; Take 0.5-1 tablets (5-10 mg total) by mouth at bedtime as needed for sleep.  4. History of recurrent deep vein thrombosis (DVT) 5. Long term current use of anticoagulant therapy Referring to heme for recommendations on cont anticoagulation - Ambulatory referral to Hematology - CBC  6. Bilateral deafness ASL interpreter present  Other orders - furosemide (LASIX) 20 MG tablet; Take 1 tablet (20 mg total) by mouth daily. - rivaroxaban (XARELTO) 20 MG TABS tablet; TAKE 1 TABLET (20 MG TOTAL) BY MOUTH DAILY WITH SUPPER.  Return in about 6 months (around 04/17/2020).    Rutherford Guys, MD Primary Care at [place] [place], [place] [address] Ph.  [phone] Fax [phone]

## 2019-10-16 NOTE — Patient Instructions (Signed)
° ° ° °  If you have lab work done today you will be contacted with your lab results within the next 2 weeks.  If you have not heard from us then please contact us. The fastest way to get your results is to register for My Chart. ° ° °IF you received an x-ray today, you will receive an invoice from [facility]. Please contact [facility] at [phone] with questions or concerns regarding your invoice.  ° °IF you received labwork today, you will receive an invoice from [facility]. Please contact [facility] at [phone] with questions or concerns regarding your invoice.  ° °Our billing staff will not be able to assist you with questions regarding bills from these companies. ° °You will be contacted with the lab results as soon as they are available. The fastest way to get your results is to activate your My Chart account. Instructions are located on the last page of this paperwork. If you have not heard from us regarding the results in 2 weeks, please contact this office. °  ° ° ° °

## 2019-10-17 LAB — COMPREHENSIVE METABOLIC PANEL
ALT: 15 IU/L (ref 0–32)
AST: 21 IU/L (ref 0–40)
Albumin/Globulin Ratio: 1.5 (ref 1.2–2.2)
Albumin: 4 g/dL (ref 3.6–4.6)
Alkaline Phosphatase: 79 IU/L (ref 39–117)
BUN/Creatinine Ratio: 25 (ref 12–28)
BUN: 19 mg/dL (ref 8–27)
Bilirubin Total: 0.2 mg/dL (ref 0.0–1.2)
CO2: 24 mmol/L (ref 20–29)
Calcium: 9.4 mg/dL (ref 8.7–10.3)
Chloride: 106 mmol/L (ref 96–106)
Creatinine, Ser: 0.77 mg/dL (ref 0.57–1.00)
GFR calc Af Amer: 84 mL/min/{1.73_m2} (ref >59)
GFR calc non Af Amer: 73 mL/min/{1.73_m2} (ref >59)
Globulin, Total: 2.6 g/dL (ref 1.5–4.5)
Glucose: 81 mg/dL (ref 65–99)
Potassium: 4.2 mmol/L (ref 3.5–5.2)
Sodium: 143 mmol/L (ref 134–144)
Total Protein: 6.6 g/dL (ref 6.0–8.5)

## 2019-10-17 LAB — LIPID PANEL
Chol/HDL Ratio: 3 ratio (ref 0.0–4.4)
Cholesterol, Total: 139 mg/dL (ref 100–199)
HDL: 47 mg/dL (ref >39)
LDL Chol Calc (NIH): 76 mg/dL (ref 0–99)
Triglycerides: 85 mg/dL (ref 0–149)
VLDL Cholesterol Cal: 16 mg/dL (ref 5–40)

## 2019-10-17 LAB — CBC
Hematocrit: 41 % (ref 34.0–46.6)
Hemoglobin: 13.8 g/dL (ref 11.1–15.9)
MCH: 31.4 pg (ref 26.6–33.0)
MCHC: 33.7 g/dL (ref 31.5–35.7)
MCV: 93 fL (ref 79–97)
Platelets: 189 10*3/uL (ref 150–450)
RBC: 4.39 x10E6/uL (ref 3.77–5.28)
RDW: 12.4 % (ref 11.7–15.4)
WBC: 5 10*3/uL (ref 3.4–10.8)

## 2019-10-28 ENCOUNTER — Ambulatory Visit: Payer: Medicare Other | Admitting: Vascular Surgery

## 2019-10-28 ENCOUNTER — Inpatient Hospital Stay (HOSPITAL_COMMUNITY): Admission: RE | Admit: 2019-10-28 | Payer: Medicare Other | Source: Ambulatory Visit

## 2019-11-04 ENCOUNTER — Ambulatory Visit: Payer: Medicare Other | Attending: Internal Medicine

## 2019-11-04 DIAGNOSIS — Z23 Encounter for immunization: Secondary | ICD-10-CM

## 2019-11-04 NOTE — Progress Notes (Signed)
   Covid-19 Vaccination Clinic  Name:  Mrytle Lyke    MRN: [id number] DOB: 1936-01-02  11/04/2019  Ms. Badman was observed post Covid-19 immunization for 15 minutes without incident. She was provided with Vaccine Information Sheet and instruction to access the V-Safe system.   Ms. Graydon was instructed to call 911 with any severe reactions post vaccine: Marland Kitchen Difficulty breathing  . Swelling of face and throat  . A fast heartbeat  . A bad rash all over body  . Dizziness and weakness   Immunizations Administered    Name Date Dose VIS Date Route   Pfizer COVID-19 Vaccine 11/04/2019  3:58 PM 0.3 mL 07/25/2019 Intramuscular   Manufacturer: [place]   Lot: [id number]   [place]: [id number]

## 2019-11-10 ENCOUNTER — Other Ambulatory Visit: Payer: Self-pay | Admitting: Family Medicine

## 2019-11-10 ENCOUNTER — Telehealth: Payer: Self-pay | Admitting: Internal Medicine

## 2019-11-10 NOTE — Telephone Encounter (Signed)
Contacted patient at [phone]     LVM to inform the patient that their COVID-19 immunization tier has arrived and can be scheduled by contacting [phone].    Danelle Earthly III  Referral and Authorizations Coordinator for the Department of Otolaryngology/Patient Contact Center  [facility]: [phone]  Fax: [phone]

## 2019-11-17 ENCOUNTER — Telehealth: Payer: Self-pay | Admitting: Internal Medicine

## 2019-11-17 NOTE — Telephone Encounter (Signed)
Contacted patient at [phone]     LVM to inform the patient that their COVID-19 immunization tier has arrived and can be scheduled by contacting [phone].    Danelle Earthly III  Referral and Authorizations Coordinator for the Department of Otolaryngology/Patient Contact Center  [facility]: [phone]  Fax: [phone]

## 2019-11-20 ENCOUNTER — Telehealth: Payer: Self-pay | Admitting: Family Medicine

## 2019-11-20 NOTE — Telephone Encounter (Signed)
So ok. Oncology referral has been calling her not [place] . She will call and set up appt . She will discuss blood thinner questions with that doctor.

## 2019-11-20 NOTE — Telephone Encounter (Signed)
Patient wants to know if she needs a blood thinner med still  . Patient has blood thinner question when you call please leave a message .

## 2019-11-26 ENCOUNTER — Other Ambulatory Visit: Payer: Self-pay | Admitting: Family Medicine

## 2019-11-26 NOTE — Telephone Encounter (Signed)
Requested Prescriptions  Pending Prescriptions Disp Refills  . potassium chloride (KLOR-CON) 10 MEQ tablet [Pharmacy Med Name: POTASSIUM CL ER 10 MEQ TABLET] 90 tablet 1    Sig: TAKE 1 TABLET BY MOUTH EVERY DAY     Endocrinology:  Minerals - Potassium Supplementation Passed - 11/26/2019  4:55 PM      Passed - K in normal range and within 360 days    Potassium  Date Value Ref Range Status  10/16/2019 4.2 3.5 - 5.2 mmol/L Final         Passed - Cr in normal range and within 360 days    Creat  Date Value Ref Range Status  04/28/2016 0.72 0.60 - 0.93 mg/dL Final    Comment:      For patients > or = 82 years of age: The upper reference limit for Creatinine is approximately 13% higher for people identified as African-American.      Creatinine, Ser  Date Value Ref Range Status  10/16/2019 0.77 0.57 - 1.00 mg/dL Final         Passed - Valid encounter within last 12 months    Recent Outpatient Visits          1 month ago Essential hypertension   [facility], Lilia Argue, MD   3 months ago Dysuria   [facility] at [facility], Fenton Malling, MD   6 months ago Abnormality of gait   [facility], Lilia Argue, MD   7 months ago Essential hypertension   [facility], Lilia Argue, MD   7 months ago Need for influenza vaccination   Primary Care at [facility], Fenton Malling, MD      Future Appointments            In 4 months Rutherford Guys, MD Primary Care at [place]

## 2019-12-05 ENCOUNTER — Other Ambulatory Visit: Payer: Self-pay | Admitting: Family Medicine

## 2019-12-09 DIAGNOSIS — H401131 Primary open-angle glaucoma, bilateral, mild stage: Secondary | ICD-10-CM | POA: Diagnosis not present

## 2020-02-23 ENCOUNTER — Other Ambulatory Visit: Payer: Self-pay | Admitting: Family Medicine

## 2020-02-23 DIAGNOSIS — I160 Hypertensive urgency: Secondary | ICD-10-CM

## 2020-02-26 ENCOUNTER — Other Ambulatory Visit: Payer: Self-pay | Admitting: Family Medicine

## 2020-02-26 DIAGNOSIS — R0989 Other specified symptoms and signs involving the circulatory and respiratory systems: Secondary | ICD-10-CM

## 2020-04-05 ENCOUNTER — Other Ambulatory Visit: Payer: Self-pay | Admitting: Family Medicine

## 2020-04-15 ENCOUNTER — Ambulatory Visit: Payer: Medicare Other | Admitting: Family Medicine

## 2020-04-16 ENCOUNTER — Encounter: Payer: Self-pay | Admitting: Family Medicine

## 2020-05-03 ENCOUNTER — Other Ambulatory Visit: Payer: Self-pay | Admitting: Family Medicine

## 2020-05-03 DIAGNOSIS — G4700 Insomnia, unspecified: Secondary | ICD-10-CM

## 2020-05-03 NOTE — Telephone Encounter (Signed)
Requested medication (s) are due for refill today: yes   Requested medication (s) are on the active medication list: yes  Last refill:  04/05/2020  Future visit scheduled: no  Notes to clinic:  this refill cannot be delegated    Requested Prescriptions  Pending Prescriptions Disp Refills   zolpidem (AMBIEN) 10 MG tablet [Pharmacy Med Name: ZOLPIDEM TARTRATE 10 MG TABLET] 30 tablet     Sig: TAKE 1/2-1 TABLETS BY MOUTH AT BEDTIME AS NEEDED FOR SLEEP.      Not Delegated - Psychiatry:  Anxiolytics/Hypnotics Failed - 05/03/2020  1:16 AM      Failed - This refill cannot be delegated      Failed - Urine Drug Screen completed in last 360 days.      Failed - Valid encounter within last 6 months    Recent Outpatient Visits           6 months ago Essential hypertension   [facility], Lilia Argue, MD   8 months ago Dysuria   Primary Care at [facility], Fenton Malling, MD   11 months ago Abnormality of gait   [facility], Lilia Argue, MD   1 year ago Essential hypertension   [facility], Lilia Argue, MD   1 year ago Need for influenza vaccination   Primary Care at [facility], Fenton Malling, MD               Signed Prescriptions Disp Refills   potassium chloride (KLOR-CON) 10 MEQ tablet 90 tablet 1    Sig: TAKE 1 TABLET BY [place]      Endocrinology:  Minerals - Potassium Supplementation Passed - 05/03/2020  1:16 AM      Passed - K in normal range and within 360 days    Potassium  Date Value Ref Range Status  10/16/2019 4.2 3.5 - 5.2 mmol/L Final          Passed - Cr in normal range and within 360 days    Creat  Date Value Ref Range Status  04/28/2016 0.72 0.60 - 0.93 mg/dL Final    Comment:      For patients > or = 82 years of age: The upper reference limit for Creatinine is approximately 13% higher for people identified as African-American.      Creatinine, Ser  Date Value Ref Range Status  10/16/2019 0.77 0.57 -  1.00 mg/dL Final          Passed - Valid encounter within last 12 months    Recent Outpatient Visits           6 months ago Essential hypertension   [facility], Lilia Argue, MD   8 months ago Dysuria   Primary Care at [facility], Fenton Malling, MD   11 months ago Abnormality of gait   [facility] at [facility], Lilia Argue, MD   1 year ago Essential hypertension   [facility] at [facility], Lilia Argue, MD   1 year ago Need for influenza vaccination   Primary Care at [facility], Fenton Malling, MD

## 2020-06-07 DIAGNOSIS — H1045 Other chronic allergic conjunctivitis: Secondary | ICD-10-CM | POA: Diagnosis not present

## 2020-06-07 DIAGNOSIS — H11123 Conjunctival concretions, bilateral: Secondary | ICD-10-CM | POA: Diagnosis not present

## 2020-06-07 DIAGNOSIS — H401131 Primary open-angle glaucoma, bilateral, mild stage: Secondary | ICD-10-CM | POA: Diagnosis not present

## 2020-06-09 DIAGNOSIS — M25561 Pain in right knee: Secondary | ICD-10-CM | POA: Diagnosis not present

## 2020-06-09 DIAGNOSIS — M1711 Unilateral primary osteoarthritis, right knee: Secondary | ICD-10-CM | POA: Diagnosis not present

## 2020-06-12 ENCOUNTER — Ambulatory Visit: Payer: Medicare Other | Attending: Internal Medicine

## 2020-06-12 DIAGNOSIS — Z23 Encounter for immunization: Secondary | ICD-10-CM

## 2020-06-12 NOTE — Progress Notes (Signed)
   Covid-19 Vaccination Clinic  Name:  Wayne Brunker    MRN: [id number] DOB: 1936/02/23  06/12/2020  Ms. Behunin was observed post Covid-19 immunization for 15 minutes without incident. She was provided with Vaccine Information Sheet and instruction to access the V-Safe system.   Ms. Corral was instructed to call 911 with any severe reactions post vaccine: Marland Kitchen Difficulty breathing  . Swelling of face and throat  . A fast heartbeat  . A bad rash all over body  . Dizziness and weakness

## 2020-06-18 ENCOUNTER — Ambulatory Visit (INDEPENDENT_AMBULATORY_CARE_PROVIDER_SITE_OTHER): Payer: Medicare Other | Admitting: Family Medicine

## 2020-06-18 ENCOUNTER — Other Ambulatory Visit: Payer: Self-pay

## 2020-06-18 DIAGNOSIS — Z23 Encounter for immunization: Secondary | ICD-10-CM

## 2020-07-28 IMAGING — DX DG KNEE COMPLETE 4+V*R*
3 series · 3 of 3 positions shown · non-contrast
Comparison: Radiographs 08/03/2014 and 01/13/2013.

CLINICAL DATA: Right knee pain for less than 2 weeks. No known
injury.

EXAM:
RIGHT KNEE - COMPLETE 4+ VIEW

[knee ap]
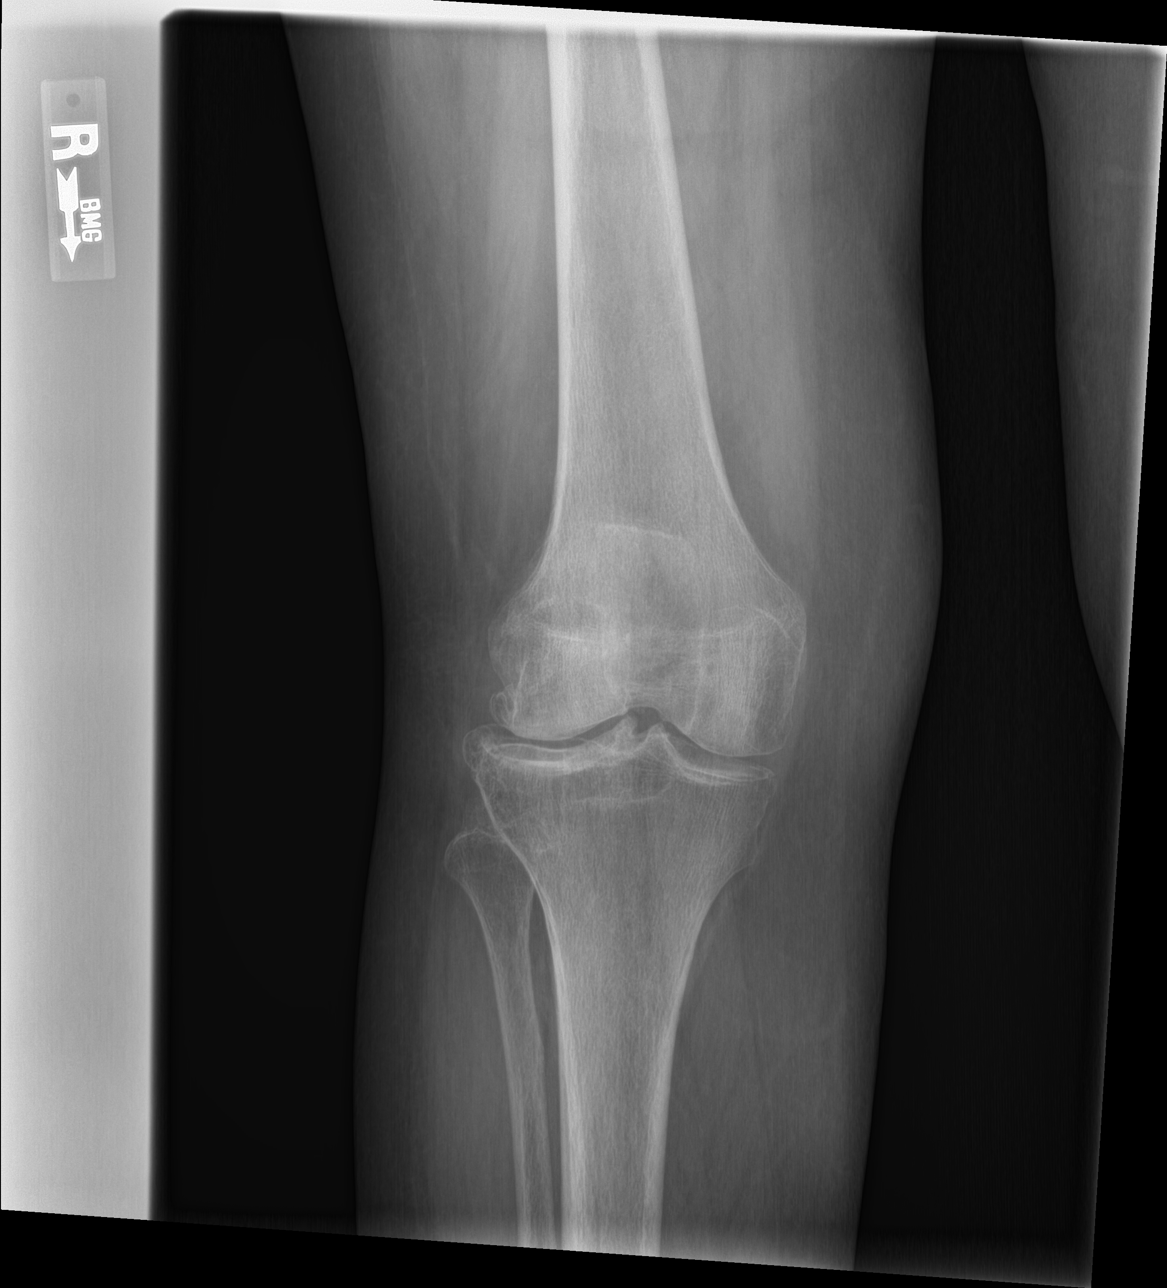

[knee lat]
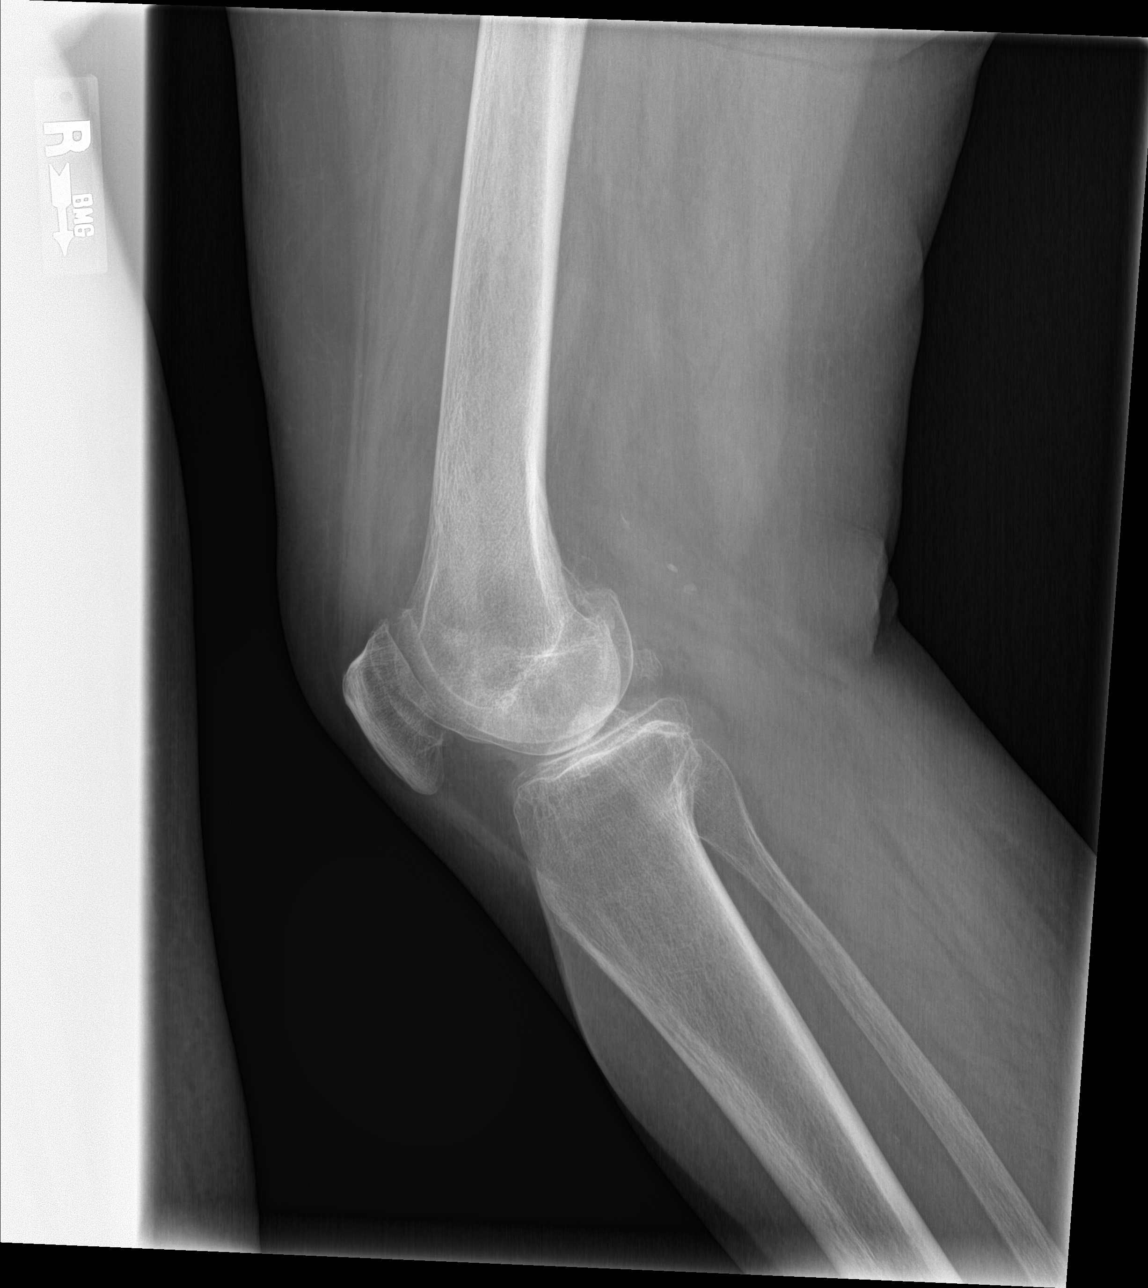

[knee [person_name]]
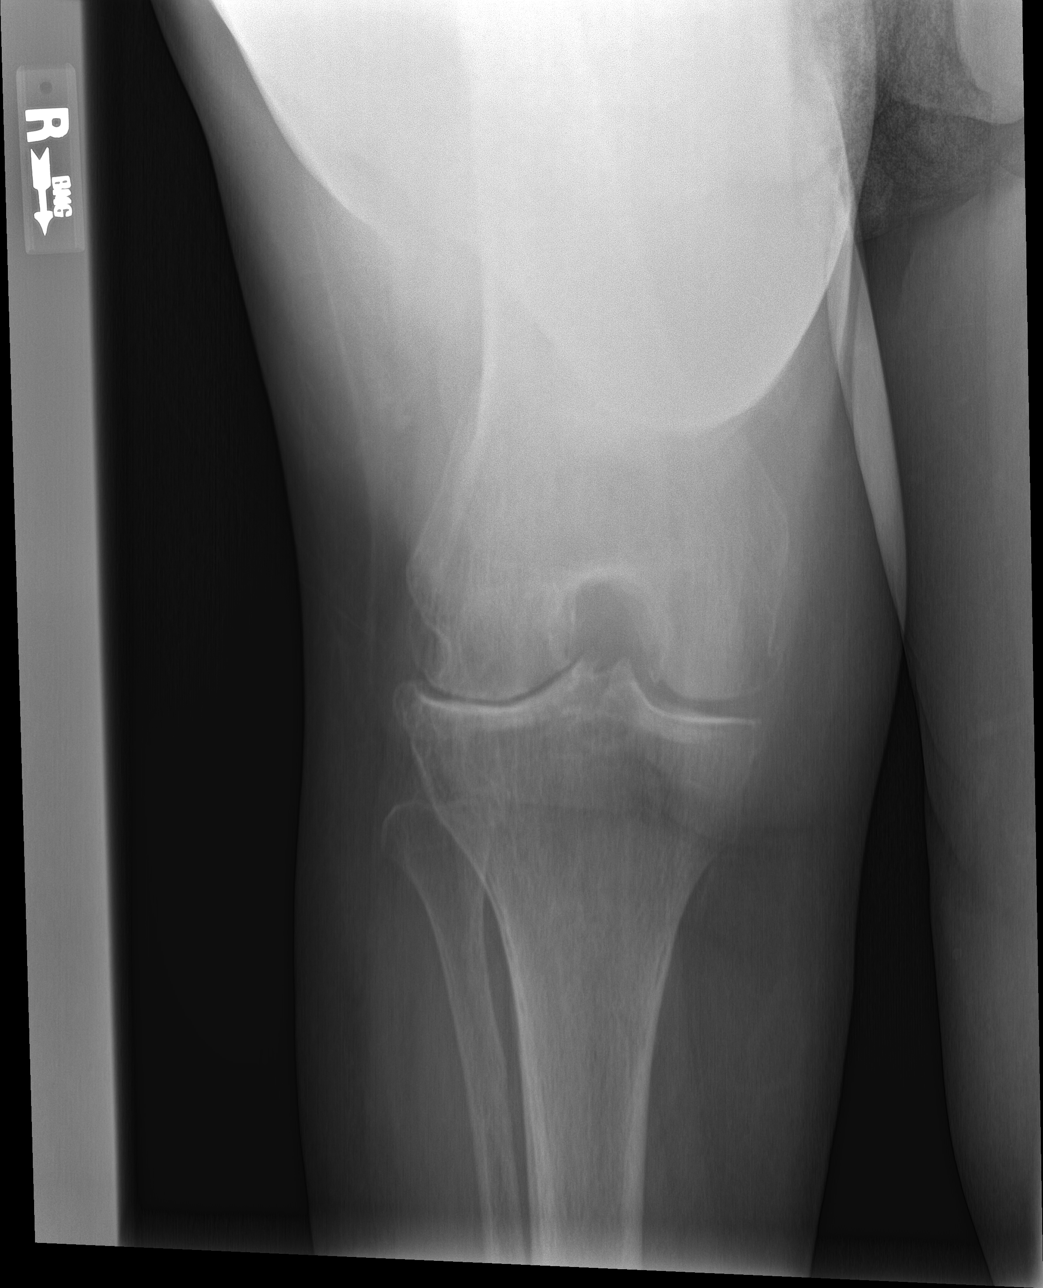

[3 of 3 positions shown; findings below may reference images not displayed]

FINDINGS: The bones appear mildly demineralized. There is no evidence of acute
fracture or dislocation. There are mildly progressive
tricompartmental degenerative changes, especially in the lateral
compartment where there is bone-on-bone apposition and prominent
peripheral osteophyte formation. No significant joint effusion. Mild
popliteal atherosclerosis noted.
IMPRESSION: No acute findings. Mildly progressive osteoarthritis, greatest in
the lateral compartment

## 2020-09-01 ENCOUNTER — Telehealth: Payer: Self-pay | Admitting: Family Medicine

## 2020-09-01 NOTE — Telephone Encounter (Signed)
What is the name of the medication?zolpidem (AMBIEN) 10 MG tablet [[id number]]     Have you contacted your pharmacy to request a refill? Yes,she needs a new script for this med.   Which pharmacy would you like this sent to? Pharmacy  [id number] [address], [place], [place] [address]  Phone:  [phone] Fax:  [phone]  DEA #:  [id number]      Patient notified that their request is being sent to the clinical staff for review and that they should receive a call once it is complete. If they do not receive a call within 72 hours they can check with their pharmacy or our office.

## 2020-09-02 ENCOUNTER — Other Ambulatory Visit: Payer: Self-pay

## 2020-09-02 ENCOUNTER — Other Ambulatory Visit: Payer: Self-pay | Admitting: Family Medicine

## 2020-09-02 DIAGNOSIS — G4700 Insomnia, unspecified: Secondary | ICD-10-CM

## 2020-09-02 DIAGNOSIS — R0989 Other specified symptoms and signs involving the circulatory and respiratory systems: Secondary | ICD-10-CM

## 2020-09-02 DIAGNOSIS — E785 Hyperlipidemia, unspecified: Secondary | ICD-10-CM

## 2020-09-02 MED ORDER — ZOLPIDEM TARTRATE 10 MG PO TABS: ORAL_TABLET | ORAL | 3 refills | Status: DC

## 2020-09-02 MED ORDER — SIMVASTATIN 20 MG PO TABS: ORAL_TABLET | ORAL | 1 refills | Status: DC

## 2020-09-02 MED ORDER — FUROSEMIDE 20 MG PO TABS: 20.0000 mg | ORAL_TABLET | Freq: Every day | ORAL | 0 refills | Status: AC

## 2020-09-02 NOTE — Telephone Encounter (Signed)
Patient is requesting a refill of the following medications: Requested Prescriptions    No prescriptions requested or ordered in this encounter  Ambien 10 mg  Date of patient request: 09/01/2020 Last office visit: 10/16/2019 Date of last refill: 05/03/2020 Last refill amount: 30 3 refills Follow up time period per chart: 6 months about 04/2020

## 2020-10-28 IMAGING — DX RIGHT FOOT COMPLETE - 3+ VIEW
2 series · 3 of 3 positions shown · non-contrast
Comparison: None.

CLINICAL DATA: Right foot swelling, redness and pain.

EXAM:
RIGHT FOOT COMPLETE - 3+ VIEW

[Series 1: foot · 0.14mm/px · 2 of 2 slices shown]
[im 1/2]
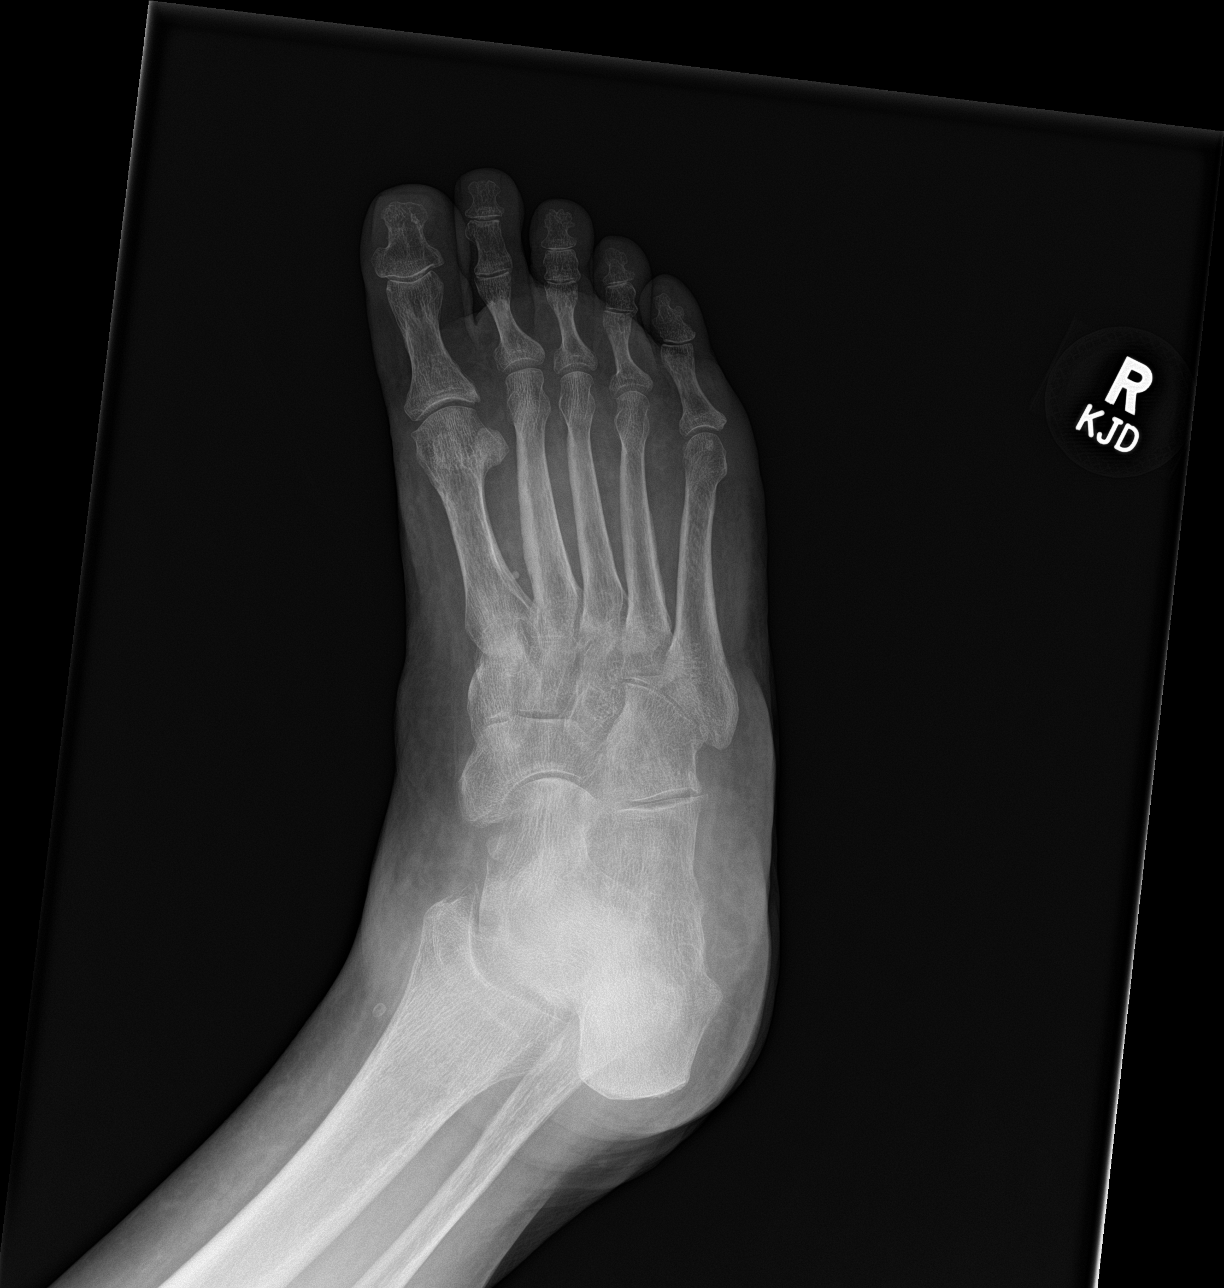
[im 2/2]
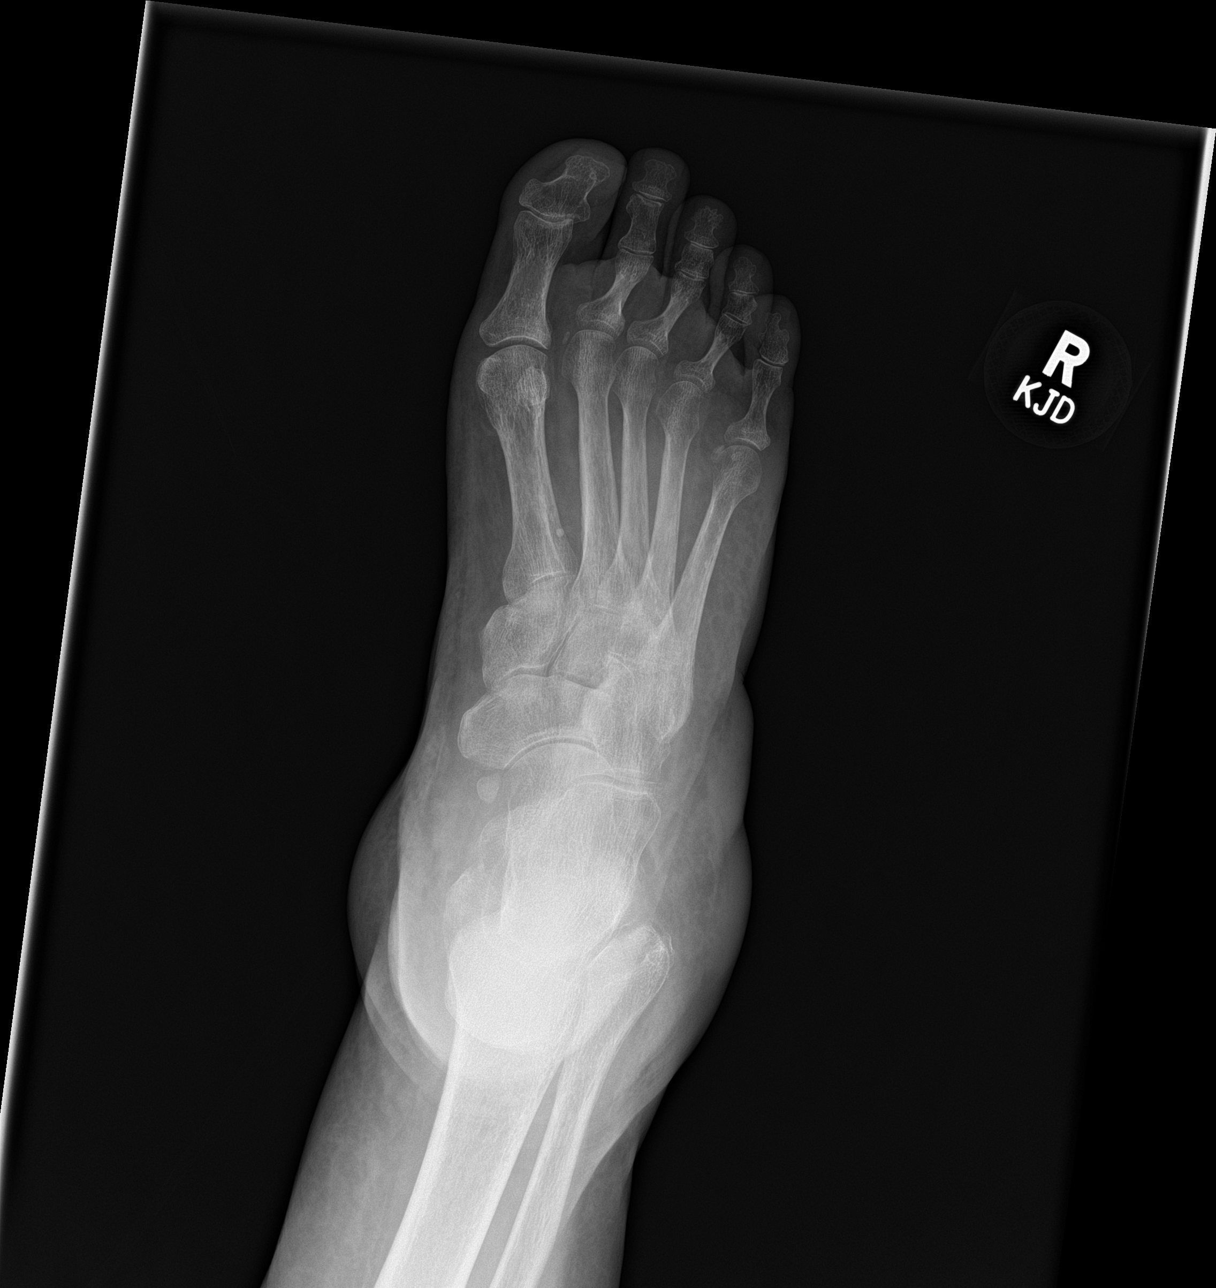

[leg]
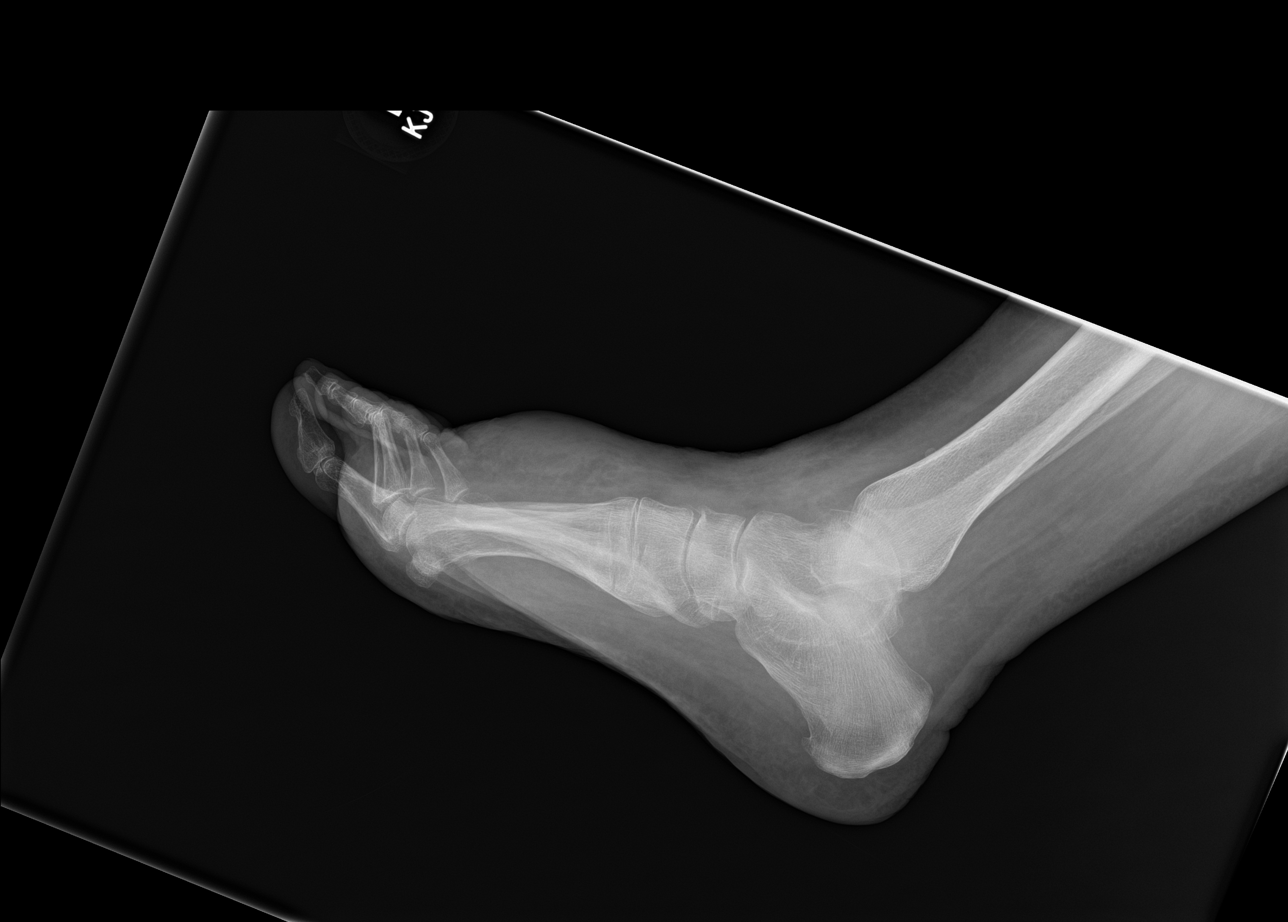

[3 of 3 positions shown; findings below may reference images not displayed]

FINDINGS: There is no evidence of fracture or dislocation. Hammertoe deformity
of the digits. Small plantar calcaneal spur. There is no evidence of
arthropathy or other focal bone abnormality. Diffuse soft tissue
edema with focal soft tissue prominence overlying the dorsum of the
metatarsals. No soft tissue air.
IMPRESSION: Soft tissue edema, with focal nonspecific soft tissue prominence
overlying the metatarsals. No soft tissue air or acute osseous
abnormality.

## 2020-12-01 ENCOUNTER — Other Ambulatory Visit: Payer: Self-pay | Admitting: Family Medicine

## 2020-12-01 DIAGNOSIS — I10 Essential (primary) hypertension: Secondary | ICD-10-CM

## 2020-12-03 ENCOUNTER — Other Ambulatory Visit: Payer: Self-pay | Admitting: Family Medicine

## 2020-12-23 ENCOUNTER — Ambulatory Visit (INDEPENDENT_AMBULATORY_CARE_PROVIDER_SITE_OTHER): Payer: Medicare Other | Admitting: Orthopedic Surgery

## 2020-12-23 ENCOUNTER — Encounter: Payer: Self-pay | Admitting: Orthopedic Surgery

## 2020-12-23 ENCOUNTER — Other Ambulatory Visit: Payer: Self-pay

## 2020-12-23 VITALS — BP 140/90 | HR 64 | Temp 97.1°F | Resp 18 | Ht 64.0 in | Wt 179.8 lb

## 2020-12-23 DIAGNOSIS — H9193 Unspecified hearing loss, bilateral: Secondary | ICD-10-CM | POA: Diagnosis not present

## 2020-12-23 DIAGNOSIS — Z86718 Personal history of other venous thrombosis and embolism: Secondary | ICD-10-CM

## 2020-12-23 DIAGNOSIS — R32 Unspecified urinary incontinence: Secondary | ICD-10-CM

## 2020-12-23 DIAGNOSIS — G4700 Insomnia, unspecified: Secondary | ICD-10-CM

## 2020-12-23 DIAGNOSIS — M25561 Pain in right knee: Secondary | ICD-10-CM

## 2020-12-23 DIAGNOSIS — I10 Essential (primary) hypertension: Secondary | ICD-10-CM

## 2020-12-23 DIAGNOSIS — E785 Hyperlipidemia, unspecified: Secondary | ICD-10-CM

## 2020-12-23 DIAGNOSIS — G8929 Other chronic pain: Secondary | ICD-10-CM | POA: Diagnosis not present

## 2020-12-23 DIAGNOSIS — R350 Frequency of micturition: Secondary | ICD-10-CM

## 2020-12-23 MED ORDER — ZOLPIDEM TARTRATE 10 MG PO TABS: ORAL_TABLET | ORAL | 1 refills | Status: DC

## 2020-12-23 NOTE — Progress Notes (Addendum)
Careteam: Patient Care Team: Clent Jacks, MD as Consulting Physician (Ophthalmology)  Seen by: Windell Moulding, AGNP-C  PLACE OF SERVICE:  [place] Directive information Does Patient Have a Medical Advance Directive?: No, Would patient like information on creating a medical advance directive?: No - Patient declined  Allergies  Allergen Reactions  . Avastin [Bevacizumab] Nausea And Vomiting  . Clindamycin/Lincomycin Itching  . Novocain [Procaine] Other (See Comments)    Seizures   . Sulfa Antibiotics Other (See Comments)    Causes fevers    Chief Complaint  Patient presents with  . Establish Care    New Patient.     HPI: Patient is a 85 y.o. female seen today to establish at [facility].   Patient is deaf. Sign language interpreter present. Husband also present to establish with [facility].   Previously seen by Dr. Pamella Pert at [place]. [place] office closed. She did not want to follow her to [facility] due to the drive.   Children are main supporters. They check on them three times a week. They help with transportation regarding medical visits and restaurants for dinner. She still cleans, does not do yard work. She does not drive. She is married. Husband is deaf. She has become more of a caregiver to her husband in the last year due to poor memory.   Living: lives in a one story house with husband and 2 dogs.   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 Mother and grandmother had elevated blood pressure. Takes metoprolol and ramipril daily. She has a blood pressure cuff at home but does not use it. She will check it at [facility]. Follows low sodium diet.   Dyslipidemia- diagnosed around age 21. She takes simvastatin every  night. Follows low fat diet, avoids fried food.   Insomnia- she worked many years on the night shift at [employer]. She has tried to switch many times to sleeping at night but cannot. When she cannot sleep she would watch television or read. Melatonin did not work. Urinates a few times a night.   History of dvt- began around age 85. She was driving home from [place] and noticed pain in her shoulder. Blood clot was in her lower left leg. She was admitted at hospital  for 3 days and started on a blood thinner. She returned to [facility] and saw a specialist. Takes Xarelto daily. No issues with clotting since starting medication.   Urinary urgency, frequency and leakage- unsuccessful trial with detrol LA. Followed by urology, Dr. Earleen Newport. Requesting to samples of Myrbetriq today.   Right knee pain- followed by Dr. Alvan Dame. Receives scheduled injections. Takes tylenol 500 mg prn for pain.  Ambulates with cane. No recent falls.   Eyes- last eye exam by Dr. Katy Fitch in 2021. Wears glasses. Denies changes in vision. Uses eye drop prior to bed for occular hypertension.   Dental- she has dentures. Denies mouth pain.  Colonoscopy- last one in 2013. She does not want another one. Denies any bowel issues or blood in stool. No history of colon cancer in family,   Mammogram- last done in 2016. No family history of breast cancer. Does not want to have them done anymore.   Bone density- last done in 2016. Advised to take caltrate.  She has them at home but does not take them regularly. Does not exercise on regular basis, can tolerate walking during grocery shopping and [employer].   Tobacco: never used  Alcohol: does not drink  Advanced directives: Daughter is HPOA. She has a DNR form, does not have a living will.    Review of Systems:  Review of Systems  Constitutional: Negative for chills, fever, malaise/fatigue and weight loss.  HENT: Positive for hearing loss. Negative for sore throat and  tinnitus.        Deaf, dentures  Eyes: Negative for blurred vision and double vision.       Glasses  Respiratory: Negative for cough, shortness of breath and wheezing.   Cardiovascular: Positive for leg swelling. Negative for chest pain.  Gastrointestinal: Positive for constipation and diarrhea. Negative for abdominal pain, blood in stool, heartburn, nausea and vomiting.  Genitourinary: Positive for frequency and urgency. Negative for dysuria and hematuria.  Musculoskeletal: Positive for joint pain. Negative for falls.       Right knee pain  Skin: Negative.   Neurological: Negative for dizziness, weakness and headaches.  Psychiatric/Behavioral: Negative for depression. The patient has insomnia. The patient is not nervous/anxious.     Past Medical History:  Diagnosis Date  . Anemia   . Arthritis   . Cataract   . Clotting disorder ([place])   . Deaf    Congenital; Requires an interpreter  . DVT (deep venous thrombosis) (H[facility])    in eye, and leg  . Edema   . GERD (gastroesophageal reflux disease)   . Glaucoma   . Hernia   . Hiatal hernia   . Hyperlipidemia   . Hypertension   . Osteoporosis   . Sleep apnea   . Thyroid disease   . Ulcer   . Urinary incontinence   . Uterine polyp    Past Surgical History:  Procedure Laterality Date  . CATARACT EXTRACTION     both eyes  . DILATATION & CURRETTAGE/HYSTEROSCOPY WITH RESECTOCOPE N/A 10/11/2012   Procedure: DILATATION & CURETTAGE/HYSTEROSCOPY WITH RESECTOCOPE;  Surgeon: Allena Katz, MD;  Location: [facility];  Service: Gynecology;  Laterality: N/A;  pt is deaf; please contact daughter Heide Spark [phone]  . EYE SURGERY    . goiter removed     age 34  . HERNIA REPAIR    . HIATAL HERNIA REPAIR N/A 12/08/2013   Procedure: LAPAROSCOPIC REPAIR OF HIATAL HERNIA  ;  Surgeon: [name], MD;  Location: [facility];  Service: General;  Laterality: N/A;  . REFRACTIVE SURGERY    . TONSILLECTOMY    . xanthoma tumor removed     Social  History:   reports that she has never smoked. She has never used smokeless tobacco. She reports current alcohol use. She reports that she does not use drugs.  Family History  Problem Relation Age of Onset  . Cancer Mother 63       colon cancer  . Stroke Mother 20       cause of death  . Heart disease Mother   . Hypertension Mother   . Heart disease Father 17       AMI; cause of death  . Heart disease Brother        AMI x 2; tobacco abuse; CABG  . Hernia Brother   . Hyperlipidemia Brother   . Hypertension Brother   . Migraines Daughter   . Heart disease Paternal Grandmother   . Esophageal cancer Neg Hx   .  Rectal cancer Neg Hx   . Stomach cancer Neg Hx     Medications: Patient's Medications  New Prescriptions   No medications on file  Previous Medications   ACETAMINOPHEN (TYLENOL) 500 MG TABLET    Take 500 mg by mouth as needed.   CLONIDINE (CATAPRES) 0.1 MG TABLET    TAKE 1 TABLET (0.1 MG TOTAL) BY MOUTH 2 (TWO) TIMES DAILY.   DIPHENHYDRAMINE (BENADRYL) 50 MG CAPSULE    Take 50 mg by mouth as needed.   FUROSEMIDE (LASIX) 20 MG TABLET    Take 1 tablet (20 mg total) by mouth daily.   LATANOPROST (XALATAN) 0.005 % OPHTHALMIC SOLUTION    Place 1 drop into both eyes at bedtime.    METOPROLOL TARTRATE (LOPRESSOR) 50 MG TABLET    Take 1 tablet (50 mg total) by mouth 2 (two) times daily.   OMEPRAZOLE (PRILOSEC) 20 MG CAPSULE    Take 20 mg by mouth as needed.   RAMIPRIL (ALTACE) 10 MG CAPSULE    Take 1 capsule (10 mg total) by mouth 2 (two) times daily.   SIMVASTATIN (ZOCOR) 20 MG TABLET    TAKE 1 TABLET (20 MG TOTAL) BY MOUTH AT BEDTIME.   TOLTERODINE (DETROL LA) 4 MG 24 HR CAPSULE    TAKE 1 CAPSULE BY MOUTH EVERY DAY   XARELTO 20 MG TABS TABLET    TAKE 1 TABLET (20 MG TOTAL) BY MOUTH DAILY WITH SUPPER.   ZOLPIDEM (AMBIEN) 10 MG TABLET    TAKE 1/2-1 TABLETS BY MOUTH AT BEDTIME AS NEEDED FOR SLEEP.  Modified Medications   No medications on file  Discontinued Medications    POTASSIUM CHLORIDE (KLOR-CON) 10 MEQ TABLET    TAKE 1 TABLET BY MOUTH EVERY DAY    Physical Exam:  Vitals:   12/23/20 1326  BP: 140/90  Pulse: 64  Resp: 18  Temp: (!) 97.1 F (36.2 C)  SpO2: 98%  Weight: 179 lb 12.8 oz (81.6 kg)  Height: 5\' 4"  (1.626 m)   Body mass index is 30.86 kg/m. Wt Readings from Last 3 Encounters:  12/23/20 179 lb 12.8 oz (81.6 kg)  10/16/19 195 lb 12.8 oz (88.8 kg)  08/26/19 194 lb 9.6 oz (88.3 kg)    Physical Exam Vitals reviewed.  Constitutional:      General: She is not in acute distress.    Appearance: She is obese.  HENT:     Head: Normocephalic.  Eyes:     General:        Right eye: No discharge.        Left eye: No discharge.  Cardiovascular:     Rate and Rhythm: Normal rate and regular rhythm.     Pulses: Normal pulses.     Heart sounds: Normal heart sounds. No murmur heard.   Pulmonary:     Effort: Pulmonary effort is normal. No respiratory distress.     Breath sounds: Normal breath sounds. No wheezing.  Abdominal:     General: Bowel sounds are normal. There is no distension.     Palpations: Abdomen is soft.     Tenderness: There is no abdominal tenderness.  Musculoskeletal:     Right lower leg: No edema.     Left lower leg: No edema.  Skin:    General: Skin is warm and dry.     Capillary Refill: Capillary refill takes less than 2 seconds.  Neurological:     General: No focal deficit present.     Mental Status:  She is alert and oriented to person, place, and time.     Gait: Gait abnormal.     Comments: cane  Psychiatric:        Mood and Affect: Mood normal.        Behavior: Behavior normal.    Labs reviewed: Basic Metabolic Panel: No results for input(s): NA, K, CL, CO2, GLUCOSE, BUN, CREATININE, CALCIUM, MG, PHOS, TSH in the last 8760 hours. Liver Function Tests: No results for input(s): AST, ALT, ALKPHOS, BILITOT, PROT, ALBUMIN in the last 8760 hours. No results for input(s): LIPASE, AMYLASE in the last 8760  hours. No results for input(s): AMMONIA in the last 8760 hours. CBC: No results for input(s): WBC, NEUTROABS, HGB, HCT, MCV, PLT in the last 8760 hours. Lipid Panel: No results for input(s): CHOL, HDL, LDLCALC, TRIG, CHOLHDL, LDLDIRECT in the last 8760 hours. TSH: No results for input(s): TSH in the last 8760 hours. A1C: Lab Results  Component Value Date   HGBA1C 5.4 11/12/2017     Assessment/Plan 1. 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  - recommend reading before bed and refraining from electronics - zolpidem (AMBIEN) 10 MG tablet; TAKE 1/2 TABLET BY MOUTH AT BEDTIME AS NEEDED FOR SLEEP.  Dispense: 30 tablet; Refill: 1  2. Urinary frequency - ongoing - unsuccessful with Detrol LA - Myrbetriq 25 mg samples given- 21 day supply  3. Essential hypertension - controlled with metoprolol and ramipril - CMP - CBC with Differential/Platelet  4. Dyslipidemia - LDL 76 in 2021 - cont simvastatin - Lipid Panel  5. Chronic pain of right knee - followed by Dr. Alvan Dame - receives scheduled knee injections - cont prn tylenol  6. Urinary incontinence, unspecified type - wears briefs for leakage - seen by Dr. Earleen Newport - she plans to schedule f/u  7. History of DVT (deep vein thrombosis) - first occurred due to cross country driving  - no sob or chest pain - cont xarelto 20 mg at dinner  8. Bilateral deafness - needs sign language interpreter or daughter during visits  Total time: 51 minutes. Greater than 50% of total time spent doing patient education and coordination of care regarding urinary issues, healthy sleep habits and health maintenance.    Labs/tests: [facility] next visit  Next appt: 01/27/2021 Windell Moulding, [facility] [phone]

## 2020-12-23 NOTE — Patient Instructions (Signed)
Preventive Care 83 Years and Older, Female Preventive care refers to lifestyle choices and visits with your health care provider that can promote health and wellness. This includes:  A yearly physical exam. This is also called an annual wellness visit.  Regular dental and eye exams.  Immunizations.  Screening for certain conditions.  Healthy lifestyle choices, such as: ? Eating a healthy diet. ? Getting regular exercise. ? Not using drugs or products that contain nicotine and tobacco. ? Limiting alcohol use. What can I expect for my preventive care visit? Physical exam Your health care provider will check your:  Height and weight. These may be used to calculate your BMI (body mass index). BMI is a measurement that tells if you are at a healthy weight.  Heart rate and blood pressure.  Body temperature.  Skin for abnormal spots. Counseling Your health care provider may ask you questions about your:  Past medical problems.  Family's medical history.  Alcohol, tobacco, and drug use.  Emotional well-being.  Home life and relationship well-being.  Sexual activity.  Diet, exercise, and sleep habits.  History of falls.  Memory and ability to understand (cognition).  Work and work Statistician.  Pregnancy and menstrual history.  Access to firearms. What immunizations do I need? Vaccines are usually given at various ages, according to a schedule. Your health care provider will recommend vaccines for you based on your age, medical history, and lifestyle or other factors, such as travel or where you work.   What tests do I need? Blood tests  Lipid and cholesterol levels. These may be checked every 5 years, or more often depending on your overall health.  Hepatitis C test.  Hepatitis B test. Screening  Lung cancer screening. You may have this screening every year starting at age 83 if you have a 30-pack-year history of smoking and currently smoke or have quit within  the past 15 years.  Colorectal cancer screening. ? All adults should have this screening starting at age 83 and continuing until age 83. ? Your health care provider may recommend screening at age 2 if you are at increased risk. ? You will have tests every 1-10 years, depending on your results and the type of screening test.  Diabetes screening. ? This is done by checking your blood sugar (glucose) after you have not eaten for a while (fasting). ? You may have this done every 1-3 years.  Mammogram. ? This may be done every 1-2 years. ? Talk with your health care provider about how often you should have regular mammograms.  Abdominal aortic aneurysm (AAA) screening. You may need this if you are a current or former smoker.  BRCA-related cancer screening. This may be done if you have a family history of breast, ovarian, tubal, or peritoneal cancers. Other tests  STD (sexually transmitted disease) testing, if you are at risk.  Bone density scan. This is done to screen for osteoporosis. You may have this done starting at age 83. Talk with your health care provider about your test results, treatment options, and if necessary, the need for more tests. Follow these instructions at home: Eating and drinking  Eat a diet that includes fresh fruits and vegetables, whole grains, lean protein, and low-fat dairy products. Limit your intake of foods with high amounts of sugar, saturated fats, and salt.  Take vitamin and mineral supplements as recommended by your health care provider.  Do not drink alcohol if your health care provider tells you not to drink.  If you drink alcohol: ? Limit how much you have to 0-1 drink a day. ? Be aware of how much alcohol is in your drink. In the [place] one drink equals one 12 oz bottle of beer (355 mL), one 5 oz glass of wine (148 mL), or one 1 oz glass of hard liquor (44 mL).   Lifestyle  Take daily care of your teeth and gums. Brush your teeth every morning  and night with fluoride toothpaste. Floss one time each day.  Stay active. Exercise for at least 30 minutes 5 or more days each week.  Do not use any products that contain nicotine or tobacco, such as cigarettes, e-cigarettes, and chewing tobacco. If you need help quitting, ask your health care provider.  Do not use drugs.  If you are sexually active, practice safe sex. Use a condom or other form of protection in order to prevent STIs (sexually transmitted infections).  Talk with your health care provider about taking a low-dose aspirin or statin.  Find healthy ways to cope with stress, such as: ? Meditation, yoga, or listening to music. ? Journaling. ? Talking to a trusted person. ? Spending time with friends and family. Safety  Always wear your seat belt while driving or riding in a vehicle.  Do not drive: ? If you have been drinking alcohol. Do not ride with someone who has been drinking. ? When you are tired or distracted. ? While texting.  Wear a helmet and other protective equipment during sports activities.  If you have firearms in your house, make sure you follow all gun safety procedures. What's next?  Visit your health care provider once a year for an annual wellness visit.  Ask your health care provider how often you should have your eyes and teeth checked.  Stay up to date on all vaccines. This information is not intended to replace advice given to you by your health care provider. Make sure you discuss any questions you have with your health care provider. Document Revised: 07/21/2020 Document Reviewed: 07/25/2018 Elsevier Patient Education  2021 [employer]  

## 2020-12-24 LAB — COMPREHENSIVE METABOLIC PANEL
AG Ratio: 1.6 (calc) (ref 1.0–2.5)
ALT: 11 U/L (ref 6–29)
AST: 17 U/L (ref 10–35)
Albumin: 4.1 g/dL (ref 3.6–5.1)
Alkaline phosphatase (APISO): 64 U/L (ref 37–153)
BUN/Creatinine Ratio: 37 (calc) — ABNORMAL HIGH (ref 6–22)
BUN: 27 mg/dL — ABNORMAL HIGH (ref 7–25)
CO2: 25 mmol/L (ref 20–32)
Calcium: 9.9 mg/dL (ref 8.6–10.4)
Chloride: 106 mmol/L (ref 98–110)
Creat: 0.73 mg/dL (ref 0.60–0.88)
Globulin: 2.6 g/dL (calc) (ref 1.9–3.7)
Glucose, Bld: 88 mg/dL (ref 65–99)
Potassium: 4.4 mmol/L (ref 3.5–5.3)
Sodium: 140 mmol/L (ref 135–146)
Total Bilirubin: 0.4 mg/dL (ref 0.2–1.2)
Total Protein: 6.7 g/dL (ref 6.1–8.1)

## 2020-12-24 LAB — LIPID PANEL
Cholesterol: 154 mg/dL (ref <200)
HDL: 50 mg/dL (ref >=50)
LDL Cholesterol (Calc): 82 mg/dL (calc)
Non-HDL Cholesterol (Calc): 104 mg/dL (calc) (ref <130)
Total CHOL/HDL Ratio: 3.1 (calc) (ref <5.0)
Triglycerides: 119 mg/dL (ref <150)

## 2020-12-24 LAB — CBC WITH DIFFERENTIAL/PLATELET
Absolute Monocytes: 504 cells/uL (ref 200–950)
Basophils Absolute: 22 cells/uL (ref 0–200)
Basophils Relative: 0.3 %
Eosinophils Absolute: 79 cells/uL (ref 15–500)
Eosinophils Relative: 1.1 %
HCT: 42 % (ref 35.0–45.0)
Hemoglobin: 13.8 g/dL (ref 11.7–15.5)
Lymphs Abs: 1951 cells/uL (ref 850–3900)
MCH: 30.8 pg (ref 27.0–33.0)
MCHC: 32.9 g/dL (ref 32.0–36.0)
MCV: 93.8 fL (ref 80.0–100.0)
MPV: 10.7 fL (ref 7.5–12.5)
Monocytes Relative: 7 %
Neutro Abs: 4644 cells/uL (ref 1500–7800)
Neutrophils Relative %: 64.5 %
Platelets: 180 10*3/uL (ref 140–400)
RBC: 4.48 10*6/uL (ref 3.80–5.10)
RDW: 12.4 % (ref 11.0–15.0)
Total Lymphocyte: 27.1 %
WBC: 7.2 10*3/uL (ref 3.8–10.8)

## 2021-01-27 ENCOUNTER — Other Ambulatory Visit: Payer: Self-pay | Admitting: Orthopedic Surgery

## 2021-01-27 ENCOUNTER — Ambulatory Visit: Payer: Medicare Other | Admitting: Orthopedic Surgery

## 2021-01-27 DIAGNOSIS — I160 Hypertensive urgency: Secondary | ICD-10-CM

## 2021-01-27 DIAGNOSIS — G4700 Insomnia, unspecified: Secondary | ICD-10-CM

## 2021-01-27 MED ORDER — ZOLPIDEM TARTRATE 5 MG PO TABS: 5.0000 mg | ORAL_TABLET | Freq: Every evening | ORAL | 1 refills | Status: DC | PRN

## 2021-01-27 MED ORDER — CLONIDINE HCL 0.1 MG PO TABS: 0.1000 mg | ORAL_TABLET | Freq: Two times a day (BID) | ORAL | 1 refills | Status: AC

## 2021-02-16 ENCOUNTER — Other Ambulatory Visit: Payer: Self-pay | Admitting: Family Medicine

## 2021-02-16 DIAGNOSIS — I10 Essential (primary) hypertension: Secondary | ICD-10-CM

## 2021-03-10 ENCOUNTER — Other Ambulatory Visit: Payer: Self-pay | Admitting: Orthopedic Surgery

## 2021-03-10 DIAGNOSIS — I10 Essential (primary) hypertension: Secondary | ICD-10-CM

## 2021-03-30 ENCOUNTER — Other Ambulatory Visit: Payer: Self-pay | Admitting: Orthopedic Surgery

## 2021-03-30 DIAGNOSIS — G4700 Insomnia, unspecified: Secondary | ICD-10-CM

## 2021-04-14 ENCOUNTER — Encounter: Payer: Self-pay | Admitting: Orthopedic Surgery

## 2021-04-14 ENCOUNTER — Encounter: Payer: Medicare Other | Admitting: Orthopedic Surgery

## 2021-04-21 NOTE — Progress Notes (Signed)
This encounter was created in error - please disregard.

## 2021-05-09 ENCOUNTER — Telehealth: Payer: Self-pay | Admitting: *Deleted

## 2021-05-09 ENCOUNTER — Inpatient Hospital Stay (HOSPITAL_COMMUNITY)
Admission: EM | Admit: 2021-05-09 | Discharge: 2021-05-14 | DRG: 551 | Disposition: A | Discharge status: Skilled Nursing Facility | Payer: Medicare Other | Attending: Family Medicine | Admitting: Family Medicine

## 2021-05-09 ENCOUNTER — Emergency Department (HOSPITAL_COMMUNITY): Payer: Medicare Other

## 2021-05-09 ENCOUNTER — Encounter (HOSPITAL_COMMUNITY): Payer: Self-pay

## 2021-05-09 ENCOUNTER — Other Ambulatory Visit: Payer: Self-pay

## 2021-05-09 DIAGNOSIS — M810 Age-related osteoporosis without current pathological fracture: Secondary | ICD-10-CM | POA: Diagnosis present

## 2021-05-09 DIAGNOSIS — Z79899 Other long term (current) drug therapy: Secondary | ICD-10-CM

## 2021-05-09 DIAGNOSIS — H913 Deaf nonspeaking, not elsewhere classified: Secondary | ICD-10-CM | POA: Diagnosis present

## 2021-05-09 DIAGNOSIS — L89153 Pressure ulcer of sacral region, stage 3: Secondary | ICD-10-CM | POA: Diagnosis present

## 2021-05-09 DIAGNOSIS — K219 Gastro-esophageal reflux disease without esophagitis: Secondary | ICD-10-CM | POA: Diagnosis present

## 2021-05-09 DIAGNOSIS — K5909 Other constipation: Secondary | ICD-10-CM | POA: Diagnosis present

## 2021-05-09 DIAGNOSIS — I214 Non-ST elevation (NSTEMI) myocardial infarction: Secondary | ICD-10-CM | POA: Diagnosis not present

## 2021-05-09 DIAGNOSIS — M1990 Unspecified osteoarthritis, unspecified site: Secondary | ICD-10-CM | POA: Diagnosis present

## 2021-05-09 DIAGNOSIS — R32 Unspecified urinary incontinence: Secondary | ICD-10-CM | POA: Diagnosis present

## 2021-05-09 DIAGNOSIS — I361 Nonrheumatic tricuspid (valve) insufficiency: Secondary | ICD-10-CM | POA: Diagnosis not present

## 2021-05-09 DIAGNOSIS — Z7401 Bed confinement status: Secondary | ICD-10-CM | POA: Diagnosis not present

## 2021-05-09 DIAGNOSIS — M47814 Spondylosis without myelopathy or radiculopathy, thoracic region: Secondary | ICD-10-CM | POA: Diagnosis not present

## 2021-05-09 DIAGNOSIS — M5136 Other intervertebral disc degeneration, lumbar region: Secondary | ICD-10-CM | POA: Diagnosis present

## 2021-05-09 DIAGNOSIS — R262 Difficulty in walking, not elsewhere classified: Secondary | ICD-10-CM | POA: Diagnosis present

## 2021-05-09 DIAGNOSIS — Z8249 Family history of ischemic heart disease and other diseases of the circulatory system: Secondary | ICD-10-CM

## 2021-05-09 DIAGNOSIS — M51369 Other intervertebral disc degeneration, lumbar region without mention of lumbar back pain or lower extremity pain: Secondary | ICD-10-CM

## 2021-05-09 DIAGNOSIS — R001 Bradycardia, unspecified: Secondary | ICD-10-CM | POA: Diagnosis not present

## 2021-05-09 DIAGNOSIS — E785 Hyperlipidemia, unspecified: Secondary | ICD-10-CM | POA: Diagnosis present

## 2021-05-09 DIAGNOSIS — R531 Weakness: Secondary | ICD-10-CM | POA: Diagnosis present

## 2021-05-09 DIAGNOSIS — E041 Nontoxic single thyroid nodule: Secondary | ICD-10-CM | POA: Diagnosis not present

## 2021-05-09 DIAGNOSIS — D649 Anemia, unspecified: Secondary | ICD-10-CM | POA: Diagnosis present

## 2021-05-09 DIAGNOSIS — I351 Nonrheumatic aortic (valve) insufficiency: Secondary | ICD-10-CM | POA: Diagnosis not present

## 2021-05-09 DIAGNOSIS — K5900 Constipation, unspecified: Secondary | ICD-10-CM | POA: Diagnosis not present

## 2021-05-09 DIAGNOSIS — M6281 Muscle weakness (generalized): Secondary | ICD-10-CM | POA: Diagnosis not present

## 2021-05-09 DIAGNOSIS — R29898 Other symptoms and signs involving the musculoskeletal system: Secondary | ICD-10-CM

## 2021-05-09 DIAGNOSIS — R0689 Other abnormalities of breathing: Secondary | ICD-10-CM | POA: Diagnosis not present

## 2021-05-09 DIAGNOSIS — M545 Low back pain, unspecified: Secondary | ICD-10-CM | POA: Diagnosis not present

## 2021-05-09 DIAGNOSIS — M5127 Other intervertebral disc displacement, lumbosacral region: Secondary | ICD-10-CM | POA: Diagnosis not present

## 2021-05-09 DIAGNOSIS — G4733 Obstructive sleep apnea (adult) (pediatric): Secondary | ICD-10-CM | POA: Diagnosis present

## 2021-05-09 DIAGNOSIS — Z7901 Long term (current) use of anticoagulants: Secondary | ICD-10-CM | POA: Diagnosis not present

## 2021-05-09 DIAGNOSIS — T189XXA Foreign body of alimentary tract, part unspecified, initial encounter: Secondary | ICD-10-CM

## 2021-05-09 DIAGNOSIS — H9190 Unspecified hearing loss, unspecified ear: Secondary | ICD-10-CM

## 2021-05-09 DIAGNOSIS — M4316 Spondylolisthesis, lumbar region: Secondary | ICD-10-CM | POA: Diagnosis not present

## 2021-05-09 DIAGNOSIS — Z888 Allergy status to other drugs, medicaments and biological substances status: Secondary | ICD-10-CM

## 2021-05-09 DIAGNOSIS — Z6831 Body mass index (BMI) 31.0-31.9, adult: Secondary | ICD-10-CM

## 2021-05-09 DIAGNOSIS — R778 Other specified abnormalities of plasma proteins: Secondary | ICD-10-CM | POA: Diagnosis not present

## 2021-05-09 DIAGNOSIS — B9620 Unspecified Escherichia coli [E. coli] as the cause of diseases classified elsewhere: Secondary | ICD-10-CM | POA: Diagnosis present

## 2021-05-09 DIAGNOSIS — Z86718 Personal history of other venous thrombosis and embolism: Secondary | ICD-10-CM

## 2021-05-09 DIAGNOSIS — H409 Unspecified glaucoma: Secondary | ICD-10-CM | POA: Diagnosis present

## 2021-05-09 DIAGNOSIS — R2689 Other abnormalities of gait and mobility: Secondary | ICD-10-CM | POA: Diagnosis not present

## 2021-05-09 DIAGNOSIS — M5126 Other intervertebral disc displacement, lumbar region: Secondary | ICD-10-CM | POA: Diagnosis not present

## 2021-05-09 DIAGNOSIS — Z20822 Contact with and (suspected) exposure to covid-19: Secondary | ICD-10-CM | POA: Diagnosis present

## 2021-05-09 DIAGNOSIS — Z882 Allergy status to sulfonamides status: Secondary | ICD-10-CM

## 2021-05-09 DIAGNOSIS — M47817 Spondylosis without myelopathy or radiculopathy, lumbosacral region: Secondary | ICD-10-CM | POA: Diagnosis not present

## 2021-05-09 DIAGNOSIS — N390 Urinary tract infection, site not specified: Secondary | ICD-10-CM | POA: Diagnosis present

## 2021-05-09 DIAGNOSIS — I878 Other specified disorders of veins: Secondary | ICD-10-CM | POA: Diagnosis not present

## 2021-05-09 DIAGNOSIS — I959 Hypotension, unspecified: Secondary | ICD-10-CM | POA: Diagnosis not present

## 2021-05-09 DIAGNOSIS — R9389 Abnormal findings on diagnostic imaging of other specified body structures: Secondary | ICD-10-CM | POA: Diagnosis not present

## 2021-05-09 DIAGNOSIS — I517 Cardiomegaly: Secondary | ICD-10-CM | POA: Diagnosis not present

## 2021-05-09 DIAGNOSIS — M5124 Other intervertebral disc displacement, thoracic region: Secondary | ICD-10-CM | POA: Diagnosis not present

## 2021-05-09 DIAGNOSIS — M6259 Muscle wasting and atrophy, not elsewhere classified, multiple sites: Secondary | ICD-10-CM | POA: Diagnosis not present

## 2021-05-09 DIAGNOSIS — I10 Essential (primary) hypertension: Secondary | ICD-10-CM | POA: Diagnosis present

## 2021-05-09 DIAGNOSIS — Z881 Allergy status to other antibiotic agents status: Secondary | ICD-10-CM

## 2021-05-09 DIAGNOSIS — M48061 Spinal stenosis, lumbar region without neurogenic claudication: Secondary | ICD-10-CM | POA: Diagnosis not present

## 2021-05-09 DIAGNOSIS — L8990 Pressure ulcer of unspecified site, unspecified stage: Secondary | ICD-10-CM | POA: Insufficient documentation

## 2021-05-09 DIAGNOSIS — I248 Other forms of acute ischemic heart disease: Secondary | ICD-10-CM | POA: Diagnosis present

## 2021-05-09 LAB — COMPREHENSIVE METABOLIC PANEL
ALT: 21 U/L (ref 0–44)
AST: 32 U/L (ref 15–41)
Albumin: 3.1 g/dL — ABNORMAL LOW (ref 3.5–5.0)
Alkaline Phosphatase: 51 U/L (ref 38–126)
Anion gap: 8 (ref 5–15)
BUN: 19 mg/dL (ref 8–23)
CO2: 24 mmol/L (ref 22–32)
Calcium: 9.5 mg/dL (ref 8.9–10.3)
Chloride: 105 mmol/L (ref 98–111)
Creatinine, Ser: 0.6 mg/dL (ref 0.44–1.00)
GFR, Estimated: >60 mL/min (ref >60)
Glucose, Bld: 97 mg/dL (ref 70–99)
Potassium: 4.1 mmol/L (ref 3.5–5.1)
Sodium: 137 mmol/L (ref 135–145)
Total Bilirubin: 0.8 mg/dL (ref 0.3–1.2)
Total Protein: 6.7 g/dL (ref 6.5–8.1)

## 2021-05-09 LAB — CBC WITH DIFFERENTIAL/PLATELET
Abs Immature Granulocytes: 0.05 10*3/uL (ref 0.00–0.07)
Basophils Absolute: 0 10*3/uL (ref 0.0–0.1)
Basophils Relative: 1 %
Eosinophils Absolute: 0.2 10*3/uL (ref 0.0–0.5)
Eosinophils Relative: 2 %
HCT: 37.5 % (ref 36.0–46.0)
Hemoglobin: 12.7 g/dL (ref 12.0–15.0)
Immature Granulocytes: 1 %
Lymphocytes Relative: 25 %
Lymphs Abs: 2 10*3/uL (ref 0.7–4.0)
MCH: 31.4 pg (ref 26.0–34.0)
MCHC: 33.9 g/dL (ref 30.0–36.0)
MCV: 92.8 fL (ref 80.0–100.0)
Monocytes Absolute: 0.5 10*3/uL (ref 0.1–1.0)
Monocytes Relative: 6 %
Neutro Abs: 5.3 10*3/uL (ref 1.7–7.7)
Neutrophils Relative %: 65 %
Platelets: 200 10*3/uL (ref 150–400)
RBC: 4.04 MIL/uL (ref 3.87–5.11)
RDW: 13.4 % (ref 11.5–15.5)
WBC: 8.1 10*3/uL (ref 4.0–10.5)
nRBC: 0 % (ref 0.0–0.2)

## 2021-05-09 LAB — URINALYSIS, ROUTINE W REFLEX MICROSCOPIC
Bilirubin Urine: NEGATIVE
Glucose, UA: NEGATIVE mg/dL
Ketones, ur: NEGATIVE mg/dL
Leukocytes,Ua: NEGATIVE
Nitrite: POSITIVE — AB
Protein, ur: NEGATIVE mg/dL
Specific Gravity, Urine: 1.025 (ref 1.005–1.030)
pH: 5.5 (ref 5.0–8.0)

## 2021-05-09 LAB — TYPE AND SCREEN
ABO/RH(D): O POS
Antibody Screen: NEGATIVE

## 2021-05-09 LAB — RESP PANEL BY RT-PCR (FLU A&B, COVID) ARPGX2
Influenza A by PCR: NEGATIVE
Influenza B by PCR: NEGATIVE
SARS Coronavirus 2 by RT PCR: NEGATIVE

## 2021-05-09 LAB — LACTIC ACID, PLASMA
Lactic Acid, Venous: 1.3 mmol/L (ref 0.5–1.9)
Lactic Acid, Venous: 0.9 mmol/L (ref 0.5–1.9)

## 2021-05-09 LAB — URINALYSIS, MICROSCOPIC (REFLEX)

## 2021-05-09 LAB — PROTIME-INR
INR: 1.1 (ref 0.8–1.2)
Prothrombin Time: 14.1 seconds (ref 11.4–15.2)

## 2021-05-09 LAB — TROPONIN I (HIGH SENSITIVITY)
Troponin I (High Sensitivity): 117 ng/L (ref <18)
Troponin I (High Sensitivity): 106 ng/L (ref <18)

## 2021-05-09 MED ORDER — SODIUM CHLORIDE 0.9 % IV BOLUS
500.0000 mL | Freq: Once | INTRAVENOUS | Status: AC
  Administered 2021-05-09: 500 mL via INTRAVENOUS

## 2021-05-09 NOTE — ED Provider Notes (Signed)
[facility] DEPT Provider Note   CSN: [id number] Arrival date & time: 05/09/21  1624     History Chief Complaint  Patient presents with   Weakness   Leg Swelling    Melissa Murphy is a 85 y.o. female.  85 year old female with prior medical history as detailed below presents for evaluation.  Patient reports that she was watching the funeral [place] on September 19.  She tried to stand from couch and felt sudden onset of low back pain.  Since this incident she has been unable to walk because of significant weakness in both legs.  Patient also reports frequent urination with intermittent urinary incontinence.  She complains of significant constipation as well.  She denies fever.  She denies chest pain or shortness of breath.  She denies injury or fall.  She reports the pain in her back is present only when she attempts to sit.  She has been unable to stand without the assistance of family.  This is an acute change since September 19.  Interpreter services used.  Patient is deaf.  She requires ASL interpreter.   The history is provided by the patient. A language interpreter was used.  Weakness Severity:  Severe Onset quality:  Sudden Duration:  2 weeks Timing:  Constant Progression:  Unchanged Chronicity:  New Relieved by:  Nothing Worsened by:  Nothing     Past Medical History:  Diagnosis Date   Anemia    Arthritis    Cataract    Clotting disorder ([place])    Deaf    Congenital; Requires an interpreter   DVT (deep venous thrombosis) ([place])    in eye, and leg   Edema    GERD (gastroesophageal reflux disease)    Glaucoma    Hernia    Hiatal hernia    Hyperlipidemia    Hypertension    Osteoporosis    Sleep apnea    Thyroid disease    Ulcer    Urinary incontinence    Uterine polyp     Patient Active Problem List   Diagnosis Date Noted   Essential hypertension 04/17/2019   History of recurrent deep vein thrombosis (DVT)  04/17/2019   Peripheral edema 04/17/2019   Dyslipidemia 04/17/2019   Long term current use of anticoagulant therapy 04/17/2019   Deafness 04/03/2014   Insomnia 04/01/2014   Hiatal hernia 12/08/2013   Medication monitoring encounter 09/25/2013   Obstructive sleep apnea 01/10/2013   Uterine polyp     Past Surgical History:  Procedure Laterality Date   CATARACT EXTRACTION     both eyes   DILATATION & CURRETTAGE/HYSTEROSCOPY WITH RESECTOCOPE N/A 10/11/2012   Procedure: DILATATION & CURETTAGE/HYSTEROSCOPY WITH RESECTOCOPE;  Surgeon: Allena Katz, MD;  Location: [facility];  Service: Gynecology;  Laterality: N/A;  pt is deaf; please contact daughter Heide Spark [place]     goiter removed     age 52   [place] N/A 12/08/2013   Procedure: LAPAROSCOPIC REPAIR OF HIATAL HERNIA  ;  Surgeon: [name], MD;  Location: [facility];  Service: General;  Laterality: N/A;   REFRACTIVE SURGERY     TONSILLECTOMY     xanthoma tumor removed       OB History     Gravida  4   Para  3   Term      Preterm      AB  1   Living  3      SAB  1   IAB      Ectopic      Multiple      Live Births              Family History  Problem Relation Age of Onset   Cancer Mother 61       colon cancer   Stroke Mother 56       cause of death   Heart disease Mother    Hypertension Mother    Heart disease Father 35       AMI; cause of death   Heart disease Brother        AMI x 2; tobacco abuse; CABG   Hernia Brother    Hyperlipidemia Brother    Hypertension Brother    Migraines Daughter    Heart disease Paternal Grandmother    Esophageal cancer Neg Hx    Rectal cancer Neg Hx    Stomach cancer Neg Hx     Social History   Tobacco Use   Smoking status: Never   Smokeless tobacco: Never  Vaping Use   Vaping Use: Never used  Substance Use Topics   Alcohol use: Yes    Alcohol/week: 0.0 standard drinks    Comment: occasionally    Drug use: No    Home Medications Prior to Admission medications   Medication Sig Start Date End Date Taking? Authorizing Provider  acetaminophen (TYLENOL) 500 MG tablet Take 500 mg by mouth as needed.    [provider]  amLODipine (NORVASC) 2.5 MG tablet Take 2.5 mg by mouth daily.    [provider]  cloNIDine (CATAPRES) 0.1 MG tablet Take 1 tablet (0.1 mg total) by mouth 2 (two) times daily. 01/27/21   Fargo, Amy E, NP  diphenhydrAMINE (BENADRYL) 50 MG capsule Take 50 mg by mouth as needed.    [provider]  furosemide (LASIX) 20 MG tablet Take 1 tablet (20 mg total) by mouth daily. 09/02/20   Just, Laurita Quint, FNP  latanoprost (XALATAN) 0.005 % ophthalmic solution Place 1 drop into both eyes at bedtime.  08/26/18   [provider]  loperamide (IMODIUM) 2 MG capsule Take 2 mg by mouth as needed for diarrhea or loose stools.    [provider]  metoprolol tartrate (LOPRESSOR) 50 MG tablet TAKE 1 TABLET BY MOUTH TWICE A DAY 03/11/21   Fargo, Amy E, NP  Neomycin-Bacitracin-Polymyxin (TRIPLE ANTIBIOTIC) OINT Apply 1 application topically as needed.    [provider]  omeprazole (PRILOSEC) 20 MG capsule Take 20 mg by mouth as needed.    [provider]  ondansetron (ZOFRAN-ODT) 4 MG disintegrating tablet Take 4 mg by mouth every 8 (eight) hours as needed for nausea or vomiting.    [provider]  ramipril (ALTACE) 10 MG capsule Take 1 capsule (10 mg total) by mouth 2 (two) times daily. 10/16/19   Jacelyn Pi, Lilia Argue, MD  simvastatin (ZOCOR) 20 MG tablet TAKE 1 TABLET (20 MG TOTAL) BY MOUTH AT BEDTIME. 09/02/20   Just, Laurita Quint, FNP  XARELTO 20 MG TABS tablet TAKE 1 TABLET (20 MG TOTAL) BY MOUTH DAILY WITH SUPPER. 04/05/20   Jacelyn Pi, Lilia Argue, MD  zolpidem (AMBIEN) 10 MG tablet TAKE 1/2 TABLET BY MOUTH AT BEDTIME AS NEEDED FOR SLEEP 03/30/21   Fargo, Amy E, NP  zolpidem (AMBIEN) 5 MG tablet Take 1 tablet (5 mg total) by mouth at  bedtime as needed for  sleep. 01/27/21   Windell Moulding E, NP    Allergies    Avastin [bevacizumab], Clindamycin/lincomycin, Novocain [procaine], and Sulfa antibiotics  Review of Systems   Review of Systems  Neurological:  Positive for weakness.  All other systems reviewed and are negative.  Physical Exam Updated Vital Signs BP (!) 143/74 (BP Location: Left Arm)   Pulse 72   Temp 97.9 F (36.6 C) (Oral)   Resp 18   Ht 5\' 4"  (1.626 m)   Wt 82 kg   SpO2 96%   BMI 31.03 kg/m   Physical Exam Vitals and nursing note reviewed.  Constitutional:      General: She is not in acute distress.    Appearance: Normal appearance. She is well-developed.  HENT:     Head: Normocephalic and atraumatic.     Mouth/Throat:     Mouth: Mucous membranes are dry.  Eyes:     Conjunctiva/sclera: Conjunctivae normal.     Pupils: Pupils are equal, round, and reactive to light.  Cardiovascular:     Rate and Rhythm: Normal rate and regular rhythm.     Heart sounds: Normal heart sounds.  Pulmonary:     Effort: Pulmonary effort is normal. No respiratory distress.     Breath sounds: Normal breath sounds.  Abdominal:     General: There is no distension.     Palpations: Abdomen is soft.     Tenderness: There is no abdominal tenderness.  Musculoskeletal:        General: No deformity. Normal range of motion.     Cervical back: Normal range of motion and neck supple.  Skin:    General: Skin is warm and dry.  Neurological:     General: No focal deficit present.     Mental Status: She is alert and oriented to person, place, and time.     Comments: Patient is able to lift both lower extremities off of the stretcher.  She reports that she is unable to bear weight.   Patient is recumbent in the supine position during exam.  Patient complains of significant low back pain with attempted sitting.  Pain is localized to the low T-spine / Upper lumbar spine.    ED Results / Procedures / Treatments   Labs (all  labs ordered are listed, but only abnormal results are displayed) Labs Reviewed  URINE CULTURE  RESP PANEL BY RT-PCR (FLU A&B, COVID) ARPGX2  URINALYSIS, ROUTINE W REFLEX MICROSCOPIC  COMPREHENSIVE METABOLIC PANEL  CBC WITH DIFFERENTIAL/PLATELET  LACTIC ACID, PLASMA  LACTIC ACID, PLASMA  PROTIME-INR  TYPE AND SCREEN  TROPONIN I (HIGH SENSITIVITY)    EKG None  Radiology No results found.  Procedures Procedures   Medications Ordered in ED Medications  sodium chloride 0.9 % bolus 500 mL (has no administration in time range)    ED Course  I have reviewed the triage vital signs and the nursing notes.  Pertinent labs & imaging results that were available during my care of the patient were reviewed by me and considered in my medical decision making (see chart for details).    MDM Rules/Calculators/A&P                           MDM  MSE complete  Melissa Murphy was evaluated in Emergency Department on 05/09/2021 for the symptoms described in the history of present illness. She was evaluated in the context of the global COVID-19 pandemic, which necessitated consideration that the  patient might be at risk for infection with the SARS-CoV-2 virus that causes COVID-19. Institutional protocols and algorithms that pertain to the evaluation of patients at risk for COVID-19 are in a state of rapid change based on information released by regulatory bodies including the CDC and federal and state organizations. These policies and algorithms were followed during the patient's care in the ED.   Patient is presenting with complaint of acutely increased weakness of her legs.  She reports that the symptoms started on September 19 while she is watching the funeral of the [place].  Reportedly she has been unable to stand or bear weight secondary to weakness in both legs and also low back pain and discomfort.  Work-up obtained in the ED is concerning for mildly elevated troponin.  Initial  troponin is 117 with a repeat of 106.  Patient specifically denied chest pain or shortness of breath.  CT imaging shows retrolisthesis at L2-L3.  This appears to be similar to prior CT imaging.  MRI imaging was attempted.  Per MRI tech patient apparently was uncooperative with exam.  Patient would benefit from admission for further work-up and treatment.  Case discussed with the hospitalist service who will evaluate for admission.  Final Clinical Impression(s) / ED Diagnoses Final diagnoses:  Weakness    Rx / DC Orders ED Discharge Orders     None        Valarie Merino, MD 05/09/21 2233

## 2021-05-09 NOTE — Telephone Encounter (Signed)
Vinnie Level, daughter, called and stated that she was going to call 911 because patient is too weak to get up and walk. Stated that patient is in good mind and no other symptoms noted but just to weak to walk or get up out of bed.   FYI

## 2021-05-09 NOTE — ED Notes (Signed)
Patient transported to CT 

## 2021-05-09 NOTE — ED Notes (Signed)
X-ray at bedside

## 2021-05-09 NOTE — H&P (Signed)
History and Physical    Melissa Murphy UXN:[id number] DOB: October 02, 1935 DOA: 05/09/2021  PCP: Yvonna Alanis, NP   Patient coming from: Home  Chief Complaint: Unable to walk, weakness in legs, constipation.   HPI: Melissa Murphy is a 85 y.o. female with medical history significant for HTN, hx of DVT on xarelto, HLD, OA  Seen with aide of Sign Language Interpretor by tablet in ER.  She reports that last week she was watching the funeral of Melissa Murphy on TV when she tried to stand up from the couch and was not able to.  She felt a sudden onset of low back pain which was worsened with movement walking and standing up.  Over the next few days she has had increasing weakness in her legs and has not been able to ambulate and she fell inside to come to the emergency room for evaluation.  She reports she is able to urinate and has no dysuria.  She reports having constipation which she has had in the past but seemed a little worse over the last few days.  Denies any injury or trauma to her back.  She denies any fever or chills nausea or vomiting.  She states she has not had any chest pain, chest pressure or palpitations and has not had any shortness of breath.  She reports she is never had symptoms like this in the past.  ED Course: Melissa Murphy is been hemodynamically stable in the emergency room with blood pressure mildly elevated in the 140-150/70s range.  She is oxygenating in the 95 to 98% on room air.  The scan of her thoracic and lumbar spine shows degenerative disc disease with arthritic changes with some foraminal stenosis.  [place] elevated troponin of 117 and repeat troponin of 106 a few hours later.  CMP is unremarkable.  CBC is unremarkable.  Lactic acid 1.3 initially and then repeated was 0.9.  No EKG changes.  RI of the lumbar spine was ordered by the ER physician but patient was unable to tolerate due to discomfort to get the scans.  She has had constipation and requests an enema.   Hospitalist service been asked to admit for further management  Review of Systems:  General: Denies fever, chills, weight loss, night sweats.  Denies dizziness.  Denies change in appetite HENT: Denies head trauma, headache, denies change in hearing, tinnitus.  Denies nasal congestion or bleeding.  Denies sore throat.  Denies difficulty swallowing Eyes: Denies blurry vision, pain in eye, drainage.  Denies discoloration of eyes. Neck: Denies pain.  Denies swelling.  Denies pain with movement. Cardiovascular: Denies chest pain, palpitations.  Denies edema.  Denies orthopnea Respiratory: Denies shortness of breath, cough.  Denies wheezing.  Denies sputum production Gastrointestinal: Reports constipation. Denies abdominal pain, swelling.  Denies nausea, vomiting, diarrhea.  Denies melena.  Denies hematemesis. Musculoskeletal:  Reports low back pain and limitation of movement legs.  Denies deformity or swelling.  Genitourinary: Denies pelvic pain.  Denies urinary frequency or hesitancy.  Denies dysuria.  Skin: Denies rash.  Denies petechiae, purpura, ecchymosis. Neurological: Denies syncope.  Denies seizure activity.  Denies paresthesia. Denies slurred speech, drooping face.  Denies visual change. Psychiatric: Denies depression, anxiety. Denies hallucinations.  Past Medical History:  Diagnosis Date   Anemia    Arthritis    Cataract    Clotting disorder ([place])    Deaf    Congenital; Requires an interpreter   DVT (deep venous thrombosis) ([place])    in eye, and leg  Edema    GERD (gastroesophageal reflux disease)    Glaucoma    Hernia    Hiatal hernia    Hyperlipidemia    Hypertension    Osteoporosis    Sleep apnea    Thyroid disease    Ulcer    Urinary incontinence    Uterine polyp     Past Surgical History:  Procedure Laterality Date   CATARACT EXTRACTION     both eyes   DILATATION & CURRETTAGE/HYSTEROSCOPY WITH RESECTOCOPE N/A 10/11/2012   Procedure: DILATATION &  CURETTAGE/HYSTEROSCOPY WITH RESECTOCOPE;  Surgeon: Allena Katz, MD;  Location: [facility];  Service: Gynecology;  Laterality: N/A;  pt is deaf; please contact daughter Heide [place]     goiter removed     age 69   [place] N/A 12/08/2013   Procedure: LAPAROSCOPIC REPAIR OF HIATAL HERNIA  ;  Surgeon: [name], MD;  Location: [facility];  Service: General;  Laterality: N/A;   REFRACTIVE SURGERY     TONSILLECTOMY     xanthoma tumor removed      Social History  reports that she has never smoked. She has never used smokeless tobacco. She reports current alcohol use. She reports that she does not use drugs.  Allergies  Allergen Reactions   Avastin [Bevacizumab] Nausea And Vomiting   Clindamycin/Lincomycin Itching   Novocain [Procaine] Other (See Comments)    Seizures    Sulfa Antibiotics Other (See Comments)    Causes fevers    Family History  Problem Relation Age of Onset   Cancer Mother 34       colon cancer   Stroke Mother 70       cause of death   Heart disease Mother    Hypertension Mother    Heart disease Father 22       AMI; cause of death   Heart disease Brother        AMI x 2; tobacco abuse; CABG   Hernia Brother    Hyperlipidemia Brother    Hypertension Brother    Migraines Daughter    Heart disease Paternal Grandmother    Esophageal cancer Neg Hx    Rectal cancer Neg Hx    Stomach cancer Neg Hx      Prior to Admission medications   Medication Sig Start Date End Date Taking? Authorizing Provider  acetaminophen (TYLENOL) 500 MG tablet Take 500 mg by mouth as needed.    [provider]  amLODipine (NORVASC) 2.5 MG tablet Take 2.5 mg by mouth daily.    [provider]  cloNIDine (CATAPRES) 0.1 MG tablet Take 1 tablet (0.1 mg total) by mouth 2 (two) times daily. 01/27/21   Fargo, Amy E, NP  diphenhydrAMINE (BENADRYL) 50 MG capsule Take 50 mg by mouth as needed.    [provider]   furosemide (LASIX) 20 MG tablet Take 1 tablet (20 mg total) by mouth daily. 09/02/20   Just, Laurita Quint, FNP  latanoprost (XALATAN) 0.005 % ophthalmic solution Place 1 drop into both eyes at bedtime.  08/26/18   [provider]  loperamide (IMODIUM) 2 MG capsule Take 2 mg by mouth as needed for diarrhea or loose stools.    [provider]  metoprolol tartrate (LOPRESSOR) 50 MG tablet TAKE 1 TABLET BY MOUTH TWICE A DAY 03/11/21   Fargo, Amy E, NP  Neomycin-Bacitracin-Polymyxin (TRIPLE ANTIBIOTIC) OINT Apply 1 application topically as needed.  [provider]  omeprazole (PRILOSEC) 20 MG capsule Take 20 mg by mouth as needed.    [provider]  ondansetron (ZOFRAN-ODT) 4 MG disintegrating tablet Take 4 mg by mouth every 8 (eight) hours as needed for nausea or vomiting.    [provider]  ramipril (ALTACE) 10 MG capsule Take 1 capsule (10 mg total) by mouth 2 (two) times daily. 10/16/19   Jacelyn Pi, Lilia Argue, MD  simvastatin (ZOCOR) 20 MG tablet TAKE 1 TABLET (20 MG TOTAL) BY MOUTH AT BEDTIME. 09/02/20   Just, Laurita Quint, FNP  XARELTO 20 MG TABS tablet TAKE 1 TABLET (20 MG TOTAL) BY MOUTH DAILY WITH SUPPER. 04/05/20   Jacelyn Pi, Lilia Argue, MD  zolpidem (AMBIEN) 10 MG tablet TAKE 1/2 TABLET BY MOUTH AT BEDTIME AS NEEDED FOR SLEEP 03/30/21   Fargo, Amy E, NP  zolpidem (AMBIEN) 5 MG tablet Take 1 tablet (5 mg total) by mouth at bedtime as needed for sleep. 01/27/21   Yvonna Alanis, NP    Physical Exam: Vitals:   05/09/21 1832 05/09/21 1932 05/09/21 2002 05/09/21 2032  BP: (!) 151/74 (!) 156/75 (!) 150/76 (!) 156/75  Pulse: 69 73 70 74  Resp: 19 20 20 16   Temp:      TempSrc:      SpO2: 98% 96% 97% 95%  Weight:      Height:        Constitutional: NAD, calm, comfortable Vitals:   05/09/21 1832 05/09/21 1932 05/09/21 2002 05/09/21 2032  BP: (!) 151/74 (!) 156/75 (!) 150/76 (!) 156/75  Pulse: 69 73 70 74  Resp: 19 20 20 16   Temp:      TempSrc:       SpO2: 98% 96% 97% 95%  Weight:      Height:       General: WDWN, Alert and oriented x3.  Eyes: EOMI, PERRL, conjunctivae normal.  Sclera nonicteric HENT:  [place]/AT, external ears normal.  Nares patent without epistasis.  Mucous membranes are dry. Posterior pharynx clear Neck: Soft, normal range of motion, supple, no masses, no thyromegaly. Trachea midline Respiratory: clear to auscultation bilaterally, no wheezing, no crackles. Normal respiratory effort. No accessory muscle use.  Cardiovascular: Regular rate and rhythm, no murmurs / rubs / gallops. No extremity edema. 2+ pedal pulses.   Abdomen: Soft, no tenderness, nondistended, no rebound or guarding.  No masses palpated.  Bowel sounds normoactive Musculoskeletal: FROM upper extremities. LROM lower extremities. no cyanosis. No joint deformity upper and lower extremities. Normal muscle tone.  Skin: Warm, dry, intact no rashes, lesions, ulcers. No induration Neurologic: CN 2-12 grossly intact.  Normal speech.  Sensation intact to palpation of upper and lower extremities. Strength 5/5 upper extremities. Strength 2/5 lower extremities. No tremor Psychiatric: Normal judgment and insight. Normal mood.    Labs on Admission: I have personally reviewed following labs and imaging studies  CBC: Recent Labs  Lab 05/09/21 1750  WBC 8.1  NEUTROABS 5.3  HGB 12.7  HCT 37.5  MCV 92.8  PLT 824    Basic Metabolic Panel: Recent Labs  Lab 05/09/21 1750  NA 137  K 4.1  CL 105  CO2 24  GLUCOSE 97  BUN 19  CREATININE 0.60  CALCIUM 9.5    GFR: Estimated Creatinine Clearance: 56.1 mL/min (by C-G formula based on SCr of 0.6 mg/dL).  Liver Function Tests: Recent Labs  Lab 05/09/21 1750  AST 32  ALT 21  ALKPHOS 51  BILITOT 0.8  PROT  6.7  ALBUMIN 3.1*    Urine analysis:    Component Value Date/Time   COLORURINE YELLOW 07/08/2018 1522   APPEARANCEUR CLEAR 07/08/2018 1522   LABSPEC 1.015 07/08/2018 1522   PHURINE 5.0 07/08/2018  1522   GLUCOSEU NEGATIVE 07/08/2018 1522   HGBUR NEGATIVE 07/08/2018 1522   BILIRUBINUR negative 08/26/2019 1454   BILIRUBINUR Negative 03/31/2015 1320   KETONESUR negative 08/26/2019 [place] 07/08/2018 1522   PROTEINUR negative 08/26/2019 1454   PROTEINUR NEGATIVE 07/08/2018 1522   UROBILINOGEN 0.2 08/26/2019 1454   UROBILINOGEN 0.2 12/03/2013 1530   NITRITE Negative 08/26/2019 1454   NITRITE NEGATIVE 02/07/2018 0347   LEUKOCYTESUR Negative 08/26/2019 1454    Radiological Exams on Admission: CT Thoracic Spine Wo Contrast  Result Date: 05/09/2021 CLINICAL DATA:  Low back pain, can not walk and can not feel her legs, generalized weakness since the nineteenth EXAM: CT THORACIC AND LUMBAR SPINE WITHOUT CONTRAST TECHNIQUE: Multidetector CT imaging of the thoracic and lumbar spine was performed without contrast. Multiplanar CT image reconstructions were also generated. COMPARISON:  CT abdomen/pelvis 06/27/2017 FINDINGS: CT THORACIC SPINE FINDINGS Alignment: There is dextroscoliosis of the thoracic spine centered at T11. There is no antero or retrolisthesis. Vertebrae: The bones are diffusely demineralized. Vertebral body heights are preserved. There is no evidence of acute fracture. There is no suspicious osseous lesion. Paraspinal and other soft tissues: The paraspinal soft tissues are unremarkable. There is dependent subsegmental atelectasis in the lung bases. There is a small hiatal hernia. Disc levels: There is mild multilevel degenerative change throughout the thoracic spine. The osseous spinal canal and neural foramina are patent. CT LUMBAR SPINE FINDINGS Segmentation: Standard; the lowest disc space is designated L5-S1. Alignment: There is grade 1 retrolisthesis of L2 on L3 and L3 on L4 measuring 5 mm at both levels, unchanged compared to the CT abdomen/pelvis of 06/27/2017. Alignment is otherwise normal. Vertebrae: The bones are diffusely demineralized. Vertebral body heights  are preserved. There is no evidence of acute fracture. There is no suspicious osseous lesion. Paraspinal and other soft tissues: Small radiodense foci in the subcutaneous fat over the right back are unchanged since 2018. The paraspinal soft tissues are otherwise unremarkable. There is calcified atherosclerotic plaque of the abdominal aorta. A curvilinear radiodense object in the large bowel is of uncertain etiology but may reflect ingested material. There is no associated inflammatory change or evidence of obstruction. Disc levels: There is mild multilevel intervertebral disc space narrowing. There is multilevel facet arthropathy, most advanced at L5-S1 on the left. There is advanced degenerative endplate change with endplate osteophytes and endplate irregularity at L1-L2 and L2-L3, progressed since 2018. There is probably at least moderate spinal canal stenosis at L2-L3 due to the retrolisthesis of L2 on L3. The osseous spinal canal is otherwise patent. There is severe right neural foraminal stenosis at L3-L4. The other neural foramina appear grossly patent. IMPRESSION: CT THORACIC SPINE IMPRESSION 1. No acute fracture or traumatic malalignment of the thoracic spine. 2. Dextroscoliosis centered at T11. CT LUMBAR SPINE IMPRESSION 1. No acute fracture or traumatic malalignment of the lumbar spine. 2. Grade 1 retrolisthesis of L2 on L3 is similar to the prior CT abdomen/pelvis from 06/27/2017. There is likely at least moderate spinal canal stenosis at L2-L3 due to the retrolisthesis. MRI may be considered to better evaluate for cauda equina nerve root impingement. 3. Severe right neural foraminal stenosis at L3-L4. 4. A curvilinear radiodense object in the large bowel is of uncertain etiology but  may reflect ingested material. There is no associated inflammatory change or evidence of obstruction. Electronically Signed   By: Valetta Mole M.D.   On: 05/09/2021 17:58   CT Lumbar Spine Wo Contrast  Result Date:  05/09/2021 CLINICAL DATA:  Low back pain, can not walk and can not feel her legs, generalized weakness since the nineteenth EXAM: CT THORACIC AND LUMBAR SPINE WITHOUT CONTRAST TECHNIQUE: Multidetector CT imaging of the thoracic and lumbar spine was performed without contrast. Multiplanar CT image reconstructions were also generated. COMPARISON:  CT abdomen/pelvis 06/27/2017 FINDINGS: CT THORACIC SPINE FINDINGS Alignment: There is dextroscoliosis of the thoracic spine centered at T11. There is no antero or retrolisthesis. Vertebrae: The bones are diffusely demineralized. Vertebral body heights are preserved. There is no evidence of acute fracture. There is no suspicious osseous lesion. Paraspinal and other soft tissues: The paraspinal soft tissues are unremarkable. There is dependent subsegmental atelectasis in the lung bases. There is a small hiatal hernia. Disc levels: There is mild multilevel degenerative change throughout the thoracic spine. The osseous spinal canal and neural foramina are patent. CT LUMBAR SPINE FINDINGS Segmentation: Standard; the lowest disc space is designated L5-S1. Alignment: There is grade 1 retrolisthesis of L2 on L3 and L3 on L4 measuring 5 mm at both levels, unchanged compared to the CT abdomen/pelvis of 06/27/2017. Alignment is otherwise normal. Vertebrae: The bones are diffusely demineralized. Vertebral body heights are preserved. There is no evidence of acute fracture. There is no suspicious osseous lesion. Paraspinal and other soft tissues: Small radiodense foci in the subcutaneous fat over the right back are unchanged since 2018. The paraspinal soft tissues are otherwise unremarkable. There is calcified atherosclerotic plaque of the abdominal aorta. A curvilinear radiodense object in the large bowel is of uncertain etiology but may reflect ingested material. There is no associated inflammatory change or evidence of obstruction. Disc levels: There is mild multilevel intervertebral  disc space narrowing. There is multilevel facet arthropathy, most advanced at L5-S1 on the left. There is advanced degenerative endplate change with endplate osteophytes and endplate irregularity at L1-L2 and L2-L3, progressed since 2018. There is probably at least moderate spinal canal stenosis at L2-L3 due to the retrolisthesis of L2 on L3. The osseous spinal canal is otherwise patent. There is severe right neural foraminal stenosis at L3-L4. The other neural foramina appear grossly patent. IMPRESSION: CT THORACIC SPINE IMPRESSION 1. No acute fracture or traumatic malalignment of the thoracic spine. 2. Dextroscoliosis centered at T11. CT LUMBAR SPINE IMPRESSION 1. No acute fracture or traumatic malalignment of the lumbar spine. 2. Grade 1 retrolisthesis of L2 on L3 is similar to the prior CT abdomen/pelvis from 06/27/2017. There is likely at least moderate spinal canal stenosis at L2-L3 due to the retrolisthesis. MRI may be considered to better evaluate for cauda equina nerve root impingement. 3. Severe right neural foraminal stenosis at L3-L4. 4. A curvilinear radiodense object in the large bowel is of uncertain etiology but may reflect ingested material. There is no associated inflammatory change or evidence of obstruction. Electronically Signed   By: Valetta Mole M.D.   On: 05/09/2021 17:58   DG Chest Port 1 View  Result Date: 05/09/2021 CLINICAL DATA:  Weakness.  Hypertension. EXAM: PORTABLE CHEST 1 VIEW COMPARISON:  06/17/2018 FINDINGS: Midline trachea. Mild cardiomegaly. Tortuous thoracic aorta. No pleural effusion or pneumothorax. Clear lungs. No congestive failure. IMPRESSION: Cardiomegaly without congestive failure. Electronically Signed   By: Abigail Miyamoto M.D.   On: 05/09/2021 18:09    EKG:  Independently reviewed.  EKG shows normal sinus rhythm with prolonged QT.  No acute ST elevation or depression.  There is nonspecific ST changes but no acute signs of ischemia  Assessment/Plan Principal  Problem:   NSTEMI (non-ST elevated myocardial infarction)  Melissa Murphy is admitted to Telemetry unit.  Has elevated troponin levels of 117 and then 107 without chest pain or shortness of breath.  She has had 3 days of weakness and not feeling well at home.  Is unknown if this is her baseline troponin level versus if it is trending down.  No EKG changes Will obtain serial troponin levels overnight. Obtain echocardiogram in the morning. If troponins elevate or echocardiogram abnormal will consult cardiology  Active Problems:   Weakness of both legs Patient with 2 to 3-day history of weakness of her legs and not able to ambulate which is not normal for her.  Began with her having low back pain.  She states she is been able to urinate normally.  She reports having chronic constipation and would like an enema or laxative.    Lumbago Low back pain for the last few days without injury or trauma. CT of the back shows degenerative disc disease with small bulging of some disks. MRI of lumbar spine is ordered to further evaluate for degenerative disc disease or impingement of nerves Patient does not have saddle anesthesia and has normal feeling of the inner thighs bilaterally    Essential hypertension Monitor blood pressure.  Continue home dose of Norvasc clonidine Altace and metoprolol    DDD (degenerative disc disease), lumbar Will further evaluate with MRI of the lumbar spine    Constipation Soap sud enema ordered    Deafness Uses sign language to communicate    Long term current use of anticoagulant therapy On Xarelto for history of DVTs  DVT prophylaxis: Pt is on Xarelto which is continued  Code Status:   Full code Family Communication:  Diagnosis and plan discussed with patient with aid of the signing which interpreter.  Further recommendations to follow as clinically indicated Disposition Plan:   Patient is from:  Home  Anticipated DC to:  Home  Anticipated DC date:  Anticipate 2  midnight or more stay in hospital to treat acute condition  Admission status:  Inpatient   Melissa Aline Malcome Ambrocio MD [facility]  How to contact the [facility] Attending or Consulting provider [place] or covering provider during after hours [place], for this patient?   Check the care team in [facility] and look for a) attending/consulting [facility] provider listed and b) the [facility] team listed Log into [email or web address] and use [place]'s universal password to access. If you do not have the password, please contact the hospital operator. Locate the [facility] provider you are looking for under [facility] and page to a number that you can be directly reached. If you still have difficulty reaching the provider, please page the [facility] (Director on Call) for the Hospitalists listed on amion for assistance.  05/09/2021, 11:26 PM

## 2021-05-09 NOTE — ED Triage Notes (Signed)
Patient BIB [facility] from home. Patient c/o generalized weakness since the 19th. Patient states she has not been able to walk and cannot feel her legs. She has been on the couch since the 19th. Patient's daughter prompted her to call EMS because "something is not right". Patient uses ASL.EMS placed 20g in left hand and gave 500 NS.   154/96

## 2021-05-09 NOTE — Telephone Encounter (Signed)
Thank you  for update

## 2021-05-10 ENCOUNTER — Inpatient Hospital Stay (HOSPITAL_COMMUNITY): Payer: Medicare Other

## 2021-05-10 DIAGNOSIS — K5900 Constipation, unspecified: Secondary | ICD-10-CM

## 2021-05-10 DIAGNOSIS — M545 Low back pain, unspecified: Secondary | ICD-10-CM | POA: Diagnosis present

## 2021-05-10 DIAGNOSIS — I351 Nonrheumatic aortic (valve) insufficiency: Secondary | ICD-10-CM

## 2021-05-10 DIAGNOSIS — I361 Nonrheumatic tricuspid (valve) insufficiency: Secondary | ICD-10-CM | POA: Diagnosis not present

## 2021-05-10 DIAGNOSIS — R778 Other specified abnormalities of plasma proteins: Secondary | ICD-10-CM | POA: Diagnosis not present

## 2021-05-10 DIAGNOSIS — L8990 Pressure ulcer of unspecified site, unspecified stage: Secondary | ICD-10-CM | POA: Insufficient documentation

## 2021-05-10 DIAGNOSIS — I10 Essential (primary) hypertension: Secondary | ICD-10-CM

## 2021-05-10 LAB — CBC
HCT: 37 % (ref 36.0–46.0)
Hemoglobin: 12.3 g/dL (ref 12.0–15.0)
MCH: 31.4 pg (ref 26.0–34.0)
MCHC: 33.2 g/dL (ref 30.0–36.0)
MCV: 94.4 fL (ref 80.0–100.0)
Platelets: 184 10*3/uL (ref 150–400)
RBC: 3.92 MIL/uL (ref 3.87–5.11)
RDW: 13.6 % (ref 11.5–15.5)
WBC: 8.5 10*3/uL (ref 4.0–10.5)
nRBC: 0 % (ref 0.0–0.2)

## 2021-05-10 LAB — ECHOCARDIOGRAM COMPLETE
Area-P 1/2: 2.72 cm2
Height: 64 in
P 1/2 time: 752 msec
S' Lateral: 2.6 cm
Weight: 2892.44 oz

## 2021-05-10 LAB — TROPONIN I (HIGH SENSITIVITY): Troponin I (High Sensitivity): 65 ng/L — ABNORMAL HIGH (ref <18)

## 2021-05-10 LAB — BASIC METABOLIC PANEL
Anion gap: 8 (ref 5–15)
BUN: 20 mg/dL (ref 8–23)
CO2: 25 mmol/L (ref 22–32)
Calcium: 9.7 mg/dL (ref 8.9–10.3)
Chloride: 106 mmol/L (ref 98–111)
Creatinine, Ser: 0.5 mg/dL (ref 0.44–1.00)
GFR, Estimated: >60 mL/min (ref >60)
Glucose, Bld: 100 mg/dL — ABNORMAL HIGH (ref 70–99)
Potassium: 3.7 mmol/L (ref 3.5–5.1)
Sodium: 139 mmol/L (ref 135–145)

## 2021-05-10 MED ORDER — DOCUSATE SODIUM 100 MG PO CAPS
100.0000 mg | ORAL_CAPSULE | Freq: Two times a day (BID) | ORAL | Status: DC
  Administered 2021-05-10 – 2021-05-13 (×7): 100 mg via ORAL
  Filled 2021-05-10 (×7): qty 1

## 2021-05-10 MED ORDER — LACTATED RINGERS IV SOLN: INTRAVENOUS | Status: AC

## 2021-05-10 MED ORDER — CLONIDINE HCL 0.1 MG PO TABS: 0.1000 mg | ORAL_TABLET | Freq: Two times a day (BID) | ORAL | Status: DC

## 2021-05-10 MED ORDER — RIVAROXABAN 20 MG PO TABS: 20.0000 mg | ORAL_TABLET | Freq: Every day | ORAL | Status: DC

## 2021-05-10 MED ORDER — METOPROLOL TARTRATE 50 MG PO TABS
50.0000 mg | ORAL_TABLET | Freq: Two times a day (BID) | ORAL | Status: DC
  Administered 2021-05-10 – 2021-05-13 (×7): 50 mg via ORAL
  Filled 2021-05-10 (×3): qty 1
  Filled 2021-05-10: qty 2
  Filled 2021-05-10 (×3): qty 1

## 2021-05-10 MED ORDER — POLYETHYLENE GLYCOL 3350 17 G PO PACK
17.0000 g | PACK | Freq: Two times a day (BID) | ORAL | Status: DC
  Administered 2021-05-10 – 2021-05-13 (×8): 17 g via ORAL
  Filled 2021-05-10 (×8): qty 1

## 2021-05-10 MED ORDER — LATANOPROST 0.005 % OP SOLN
1.0000 [drp] | Freq: Every day | OPHTHALMIC | Status: DC
  Administered 2021-05-10 – 2021-05-13 (×4): 1 [drp] via OPHTHALMIC
  Filled 2021-05-10: qty 2.5

## 2021-05-10 MED ORDER — ZOLPIDEM TARTRATE 5 MG PO TABS: 5.0000 mg | ORAL_TABLET | Freq: Every evening | ORAL | Status: DC | PRN

## 2021-05-10 MED ORDER — FUROSEMIDE 20 MG PO TABS
20.0000 mg | ORAL_TABLET | Freq: Every day | ORAL | Status: DC
  Administered 2021-05-11 – 2021-05-13 (×3): 20 mg via ORAL
  Filled 2021-05-10 (×3): qty 1

## 2021-05-10 MED ORDER — SIMVASTATIN 20 MG PO TABS: 20.0000 mg | ORAL_TABLET | Freq: Every day | ORAL | Status: DC

## 2021-05-10 MED ORDER — ATORVASTATIN CALCIUM 10 MG PO TABS
10.0000 mg | ORAL_TABLET | Freq: Every day | ORAL | Status: DC
  Administered 2021-05-10 – 2021-05-13 (×4): 10 mg via ORAL
  Filled 2021-05-10 (×4): qty 1

## 2021-05-10 MED ORDER — ACETAMINOPHEN 325 MG PO TABS
650.0000 mg | ORAL_TABLET | Freq: Four times a day (QID) | ORAL | Status: DC | PRN
  Administered 2021-05-10 – 2021-05-11 (×2): 650 mg via ORAL
  Filled 2021-05-10 (×2): qty 2

## 2021-05-10 MED ORDER — RAMIPRIL 10 MG PO CAPS: 10.0000 mg | ORAL_CAPSULE | Freq: Two times a day (BID) | ORAL | Status: DC

## 2021-05-10 MED ORDER — ACETAMINOPHEN 650 MG RE SUPP: 650.0000 mg | Freq: Four times a day (QID) | RECTAL | Status: DC | PRN

## 2021-05-10 MED ORDER — CLONIDINE HCL 0.1 MG PO TABS
0.1000 mg | ORAL_TABLET | Freq: Two times a day (BID) | ORAL | Status: DC
  Administered 2021-05-10 – 2021-05-13 (×8): 0.1 mg via ORAL
  Filled 2021-05-10 (×8): qty 1

## 2021-05-10 MED ORDER — METOPROLOL TARTRATE 25 MG PO TABS: 50.0000 mg | ORAL_TABLET | Freq: Two times a day (BID) | ORAL | Status: DC

## 2021-05-10 MED ORDER — SENNA 8.6 MG PO TABS
2.0000 | ORAL_TABLET | Freq: Every day | ORAL | Status: DC
  Administered 2021-05-10 – 2021-05-12 (×3): 17.2 mg via ORAL
  Filled 2021-05-10 (×3): qty 2

## 2021-05-10 MED ORDER — HYDRALAZINE HCL 20 MG/ML IJ SOLN
5.0000 mg | Freq: Four times a day (QID) | INTRAMUSCULAR | Status: DC | PRN
  Administered 2021-05-12 – 2021-05-13 (×2): 5 mg via INTRAVENOUS
  Filled 2021-05-10 (×2): qty 1

## 2021-05-10 MED ORDER — LACTATED RINGERS IV SOLN: INTRAVENOUS | Status: DC

## 2021-05-10 MED ORDER — ENOXAPARIN SODIUM 40 MG/0.4ML IJ SOSY
40.0000 mg | PREFILLED_SYRINGE | Freq: Every day | INTRAMUSCULAR | Status: DC
  Administered 2021-05-10 – 2021-05-13 (×4): 40 mg via SUBCUTANEOUS
  Filled 2021-05-10 (×4): qty 0.4

## 2021-05-10 MED ORDER — BISACODYL 10 MG RE SUPP
10.0000 mg | Freq: Every day | RECTAL | Status: DC | PRN
  Filled 2021-05-10: qty 1

## 2021-05-10 MED ORDER — AMLODIPINE BESYLATE 5 MG PO TABS
2.5000 mg | ORAL_TABLET | Freq: Every day | ORAL | Status: DC
  Administered 2021-05-10 – 2021-05-12 (×3): 2.5 mg via ORAL
  Filled 2021-05-10 (×3): qty 1

## 2021-05-10 NOTE — Consult Note (Signed)
WOC Nurse Consult Note: Patient receiving care in [place]. Assisted with turning and repositioning the patient by her primary RN. Reason for Consult: stage 3 to gluteal cleft Wound type: hypertrophic, thickened tissue, NOT a pressure injury. Pressure Injury POA: Yes/No/NA Measurement: Wound bed: lighter than surrounding tissue and very much similar to a keloid Drainage (amount, consistency, odor) none Periwound: intact, but impacted by Moisture Associated Skin Damage- IAD. Dressing procedure/placement/frequency: No dressing is necessary. Continue to use protective barrier ointment.  [place] nurse will not follow at this time.  Please re-consult the [place] team if needed.  Val Riles, RN, MSN, CWOCN, CNS-BC, pager [phone]

## 2021-05-10 NOTE — Plan of Care (Signed)
Melissa Murphy arrived from ED around 0930. BP improved after AM meds given. She reports intermittent back pain with movement but denies need for pain meds today. Up to [facility] with PT, requires 2 assist. She states she thinks she can tolerate lying flat for MRI today. MRI tech aware and will attempt to schedule pt for later this afternoon. ASL interpreter used for communications.  Problem: Education: Goal: Knowledge of General Education information will improve Description: Including pain rating scale, medication(s)/side effects and non-pharmacologic comfort measures Outcome: Progressing   Problem: Health Behavior/Discharge Planning: Goal: Ability to manage health-related needs will improve Outcome: Progressing   Problem: Clinical Measurements: Goal: Ability to maintain clinical measurements within normal limits will improve Outcome: Progressing Goal: Will remain free from infection Outcome: Progressing Goal: Diagnostic test results will improve Outcome: Progressing Goal: Respiratory complications will improve Outcome: Progressing Goal: Cardiovascular complication will be avoided Outcome: Progressing   Problem: Activity: Goal: Risk for activity intolerance will decrease Outcome: Progressing   Problem: Nutrition: Goal: Adequate nutrition will be maintained Outcome: Progressing   Problem: Coping: Goal: Level of anxiety will decrease Outcome: Progressing   Problem: Elimination: Goal: Will not experience complications related to bowel motility Outcome: Progressing Goal: Will not experience complications related to urinary retention Outcome: Progressing   Problem: Pain Managment: Goal: General experience of comfort will improve Outcome: Progressing   Problem: Safety: Goal: Ability to remain free from injury will improve Outcome: Progressing   Problem: Skin Integrity: Goal: Risk for impaired skin integrity will decrease Outcome: Progressing

## 2021-05-10 NOTE — Plan of Care (Signed)

## 2021-05-10 NOTE — ED Notes (Signed)
Dr. Hongalgi at bedside  

## 2021-05-10 NOTE — Progress Notes (Addendum)
PROGRESS NOTE   Melissa Murphy  QQV:[id number]    DOB: 06/15/1936    DOA: 05/09/2021  PCP: Yvonna Alanis, NP   I have briefly reviewed patients previous medical records in [facility].  Chief Complaint  Patient presents with   Weakness   Leg Swelling    Brief Narrative:  85 year old married female, independent, medical history significant for HTN, DVT no longer on anticoagulation, HLD, OA, reported that week prior to admission she was watching the funeral of Melissa Murphy on TV when she tried to stand up from the couch and was unable to, felt a sudden onset of low back pain which worsened with movement, walking and standing up.  Over the next few days, noted increasing weakness of her legs and not being able to ambulate.  Admitted due to acute low back pain, ambulatory dysfunction and elevated troponins.   Assessment & Plan:  Principal Problem:   Acute low back pain Active Problems:   Deafness   Essential hypertension   Long term current use of anticoagulant therapy   Weakness of both legs   Lumbago   DDD (degenerative disc disease), lumbar   Constipation   Pressure injury of skin   Acute low back pain: - CT thoracic and lumbar spine: No acute fracture or traumatic malalignment.  Degenerative changes and some spinal stenosis. - No history of trauma. - MRI T and L-spine requested and pending. - No urinary symptoms.  Has chronic constipation which may be worsening her back pain. - Back pain seems to have improved.  Continue supportive management with multimodality pain management and therapy evaluation.  Ambulatory dysfunction - Secondary to back pain.  No focal weakness or deficits noted in lower extremities. - Therapies evaluation.  Elevated troponin - No anginal symptoms. - Troponins minimally elevated and downward trending: 117 > 106 > 65. - 2D echo LVEF 70-75%, hyperdynamic left ventricle, no regional wall motion abnormalities, grade 1 diastolic dysfunction -  Likely related to demand ischemia and not consistent with ACS.  Bruising/hematoma of left hand - Suspect due to infiltrated peripheral IV.  Does not appear to be expanding and no neurovascular compromise. - Monitor closely.  Essential hypertension - Labile and mildly uncontrolled. - Continue home clonidine, metoprolol.  Amlodipine 2.5 Mg daily newly initiated here.  Resume home Lasix dose tomorrow. - Added as needed IV hydralazine.  Hyperlipidemia - Continue statins.  History of DVT - No longer on Xarelto as per review of home med rec.  Deafness - Utilized ASL interpreter.  Constipation - S/p enema in the ED - Aggressive bowel regimen.  Curvilinear radiodense object in the large bowel of uncertain etiology - Seen on MRI of the L-spine.  Per radiology, may reflect ingested material.  No associated inflammatory change or evidence of obstruction. - Can follow-up abdominal x-ray in a.m.  Body mass index is 31.03 kg/m.  Pressure Injury 05/10/21 Buttocks Right Stage 2 -  Partial thickness loss of dermis presenting as a shallow open injury with a red, pink wound bed without slough. (Active)  05/10/21 0930  Location: Buttocks  Location Orientation: Right  Staging: Stage 2 -  Partial thickness loss of dermis presenting as a shallow open injury with a red, pink wound bed without slough.  Wound Description (Comments):   Present on Admission: Yes     Pressure Injury 05/10/21 Buttocks Left Stage 2 -  Partial thickness loss of dermis presenting as a shallow open injury with a red, pink wound bed without slough. (  Active)  05/10/21 0930  Location: Buttocks  Location Orientation: Left  Staging: Stage 2 -  Partial thickness loss of dermis presenting as a shallow open injury with a red, pink wound bed without slough.  Wound Description (Comments):   Present on Admission: Yes        DVT prophylaxis:   Lovenox   Code Status: Full Code Family Communication: None at  bedside. Disposition:  Status is: Inpatient  Remains inpatient appropriate because:Inpatient level of care appropriate due to severity of illness  Dispo: The patient is from: Home              Anticipated d/c is to:  TBD              Patient currently is not medically stable to d/c.   Difficult to place patient No        Consultants:   None  Procedures:   None  Antimicrobials:    Anti-infectives (From admission, onward)    None         Subjective:  This morning while she was still in the emergency department.  Interviewed and examined using ASL video interpreter.  Reports that she is married, lives with her spouse, independent of activities, spouse demented, deaf.  Denied pain specifically despite asking this multiple times.  Constipated with last BM 2 to 3 days ago.  No urinary difficulties.  Has chronic intermittent numbness of legs but has sensation in her lower extremity.  Objective:   Vitals:   05/10/21 0809 05/10/21 0845 05/10/21 0920 05/10/21 1323  BP: (!) 193/81 (!) 181/66 (!) 148/68 (!) 152/66  Pulse: 86 66 67 67  Resp: 18 18 18 18   Temp:   98.2 F (36.8 C) 98.4 F (36.9 C)  TempSrc:   Oral Oral  SpO2: 100% 100% 97% 97%  Weight:      Height:        General exam: Elderly female, moderately built and nourished lying comfortably propped up in bed without distress.  Oral mucosa with borderline hydration. Respiratory system: Clear to auscultation. Respiratory effort normal. Cardiovascular system: S1 & S2 heard, RRR. No JVD, murmurs, rubs, gallops or clicks. No pedal edema. Gastrointestinal system: Abdomen is nondistended, soft and nontender. No organomegaly or masses felt. Normal bowel sounds heard. Central nervous system: Alert and oriented. No focal neurological deficits.  Deaf. Extremities: Symmetric 5 x 5 power in upper extremity and at least grade 4 x 5 in lower extremities, further evaluation restricted secondary to back pain Skin: No rashes, lesions  or ulcers.  Dorsum of left hand bruising/hematoma. Psychiatry: Judgement and insight appear normal. Mood & affect appropriate.     Data Reviewed:   I have personally reviewed following labs and imaging studies   CBC: Recent Labs  Lab 05/09/21 1750 05/10/21 0615  WBC 8.1 8.5  NEUTROABS 5.3  --   HGB 12.7 12.3  HCT 37.5 37.0  MCV 92.8 94.4  PLT 200 762    Basic Metabolic Panel: Recent Labs  Lab 05/09/21 1750 05/10/21 0615  NA 137 139  K 4.1 3.7  CL 105 106  CO2 24 25  GLUCOSE 97 100*  BUN 19 20  CREATININE 0.60 0.50  CALCIUM 9.5 9.7    Liver Function Tests: Recent Labs  Lab 05/09/21 1750  AST 32  ALT 21  ALKPHOS 51  BILITOT 0.8  PROT 6.7  ALBUMIN 3.1*    CBG: No results for input(s): GLUCAP in the last 168 hours.  Microbiology  Studies:   Recent Results (from the past 240 hour(s))  Resp Panel by RT-PCR (Flu A&B, Covid) Nasopharyngeal Swab     Status: None   Collection Time: 05/09/21  5:50 PM   Specimen: Nasopharyngeal Swab; Nasopharyngeal(NP) swabs in vial transport medium  Result Value Ref Range Status   SARS Coronavirus 2 by RT PCR NEGATIVE NEGATIVE Final    Comment: (NOTE) SARS-CoV-2 target nucleic acids are NOT DETECTED.  The SARS-CoV-2 RNA is generally detectable in upper respiratory specimens during the acute phase of infection. The lowest concentration of SARS-CoV-2 viral copies this assay can detect is 138 copies/mL. A negative result does not preclude SARS-Cov-2 infection and should not be used as the sole basis for treatment or other patient management decisions. A negative result may occur with  improper specimen collection/handling, submission of specimen other than nasopharyngeal swab, presence of viral mutation(s) within the areas targeted by this assay, and inadequate number of viral copies(<138 copies/mL). A negative result must be combined with clinical observations, patient history, and epidemiological information. The expected  result is Negative.  Fact Sheet for Patients:  EntrepreneurPulse.com.au  Fact Sheet for Healthcare Providers:  IncredibleEmployment.be  This test is no t yet approved or cleared by the [place] FDA and  has been authorized for detection and/or diagnosis of SARS-CoV-2 by FDA under an Emergency Use Authorization (EUA). This EUA will remain  in effect (meaning this test can be used) for the duration of the COVID-19 declaration under Section 564(b)(1) of the Act, 21 U.S.C.section 360bbb-3(b)(1), unless the authorization is terminated  or revoked sooner.       Influenza A by PCR NEGATIVE NEGATIVE Final   Influenza B by PCR NEGATIVE NEGATIVE Final    Comment: (NOTE) The Xpert Xpress SARS-CoV-2/FLU/RSV plus assay is intended as an aid in the diagnosis of influenza from Nasopharyngeal swab specimens and should not be used as a sole basis for treatment. Nasal washings and aspirates are unacceptable for Xpert Xpress SARS-CoV-2/FLU/RSV testing.  Fact Sheet for Patients: EntrepreneurPulse.com.au  Fact Sheet for Healthcare Providers: IncredibleEmployment.be  This test is not yet approved or cleared by the [place] FDA and has been authorized for detection and/or diagnosis of SARS-CoV-2 by FDA under an Emergency Use Authorization (EUA). This EUA will remain in effect (meaning this test can be used) for the duration of the COVID-19 declaration under Section 564(b)(1) of the Act, 21 U.S.C. section 360bbb-3(b)(1), unless the authorization is terminated or revoked.  Performed at [facility], [place] [address] [place], [place] [address]      Radiology Studies:  CT Thoracic Spine Wo Contrast  Result Date: 05/09/2021 CLINICAL DATA:  Low back pain, can not walk and can not feel her legs, generalized weakness since the nineteenth EXAM: CT THORACIC AND LUMBAR SPINE WITHOUT CONTRAST TECHNIQUE:  Multidetector CT imaging of the thoracic and lumbar spine was performed without contrast. Multiplanar CT image reconstructions were also generated. COMPARISON:  CT abdomen/pelvis 06/27/2017 FINDINGS: CT THORACIC SPINE FINDINGS Alignment: There is dextroscoliosis of the thoracic spine centered at T11. There is no antero or retrolisthesis. Vertebrae: The bones are diffusely demineralized. Vertebral body heights are preserved. There is no evidence of acute fracture. There is no suspicious osseous lesion. Paraspinal and other soft tissues: The paraspinal soft tissues are unremarkable. There is dependent subsegmental atelectasis in the lung bases. There is a small hiatal hernia. Disc levels: There is mild multilevel degenerative change throughout the thoracic spine. The osseous spinal canal and neural foramina are patent.  CT LUMBAR SPINE FINDINGS Segmentation: Standard; the lowest disc space is designated L5-S1. Alignment: There is grade 1 retrolisthesis of L2 on L3 and L3 on L4 measuring 5 mm at both levels, unchanged compared to the CT abdomen/pelvis of 06/27/2017. Alignment is otherwise normal. Vertebrae: The bones are diffusely demineralized. Vertebral body heights are preserved. There is no evidence of acute fracture. There is no suspicious osseous lesion. Paraspinal and other soft tissues: Small radiodense foci in the subcutaneous fat over the right back are unchanged since 2018. The paraspinal soft tissues are otherwise unremarkable. There is calcified atherosclerotic plaque of the abdominal aorta. A curvilinear radiodense object in the large bowel is of uncertain etiology but may reflect ingested material. There is no associated inflammatory change or evidence of obstruction. Disc levels: There is mild multilevel intervertebral disc space narrowing. There is multilevel facet arthropathy, most advanced at L5-S1 on the left. There is advanced degenerative endplate change with endplate osteophytes and endplate  irregularity at L1-L2 and L2-L3, progressed since 2018. There is probably at least moderate spinal canal stenosis at L2-L3 due to the retrolisthesis of L2 on L3. The osseous spinal canal is otherwise patent. There is severe right neural foraminal stenosis at L3-L4. The other neural foramina appear grossly patent. IMPRESSION: CT THORACIC SPINE IMPRESSION 1. No acute fracture or traumatic malalignment of the thoracic spine. 2. Dextroscoliosis centered at T11. CT LUMBAR SPINE IMPRESSION 1. No acute fracture or traumatic malalignment of the lumbar spine. 2. Grade 1 retrolisthesis of L2 on L3 is similar to the prior CT abdomen/pelvis from 06/27/2017. There is likely at least moderate spinal canal stenosis at L2-L3 due to the retrolisthesis. MRI may be considered to better evaluate for cauda equina nerve root impingement. 3. Severe right neural foraminal stenosis at L3-L4. 4. A curvilinear radiodense object in the large bowel is of uncertain etiology but may reflect ingested material. There is no associated inflammatory change or evidence of obstruction. Electronically Signed   By: Valetta Mole M.D.   On: 05/09/2021 17:58   CT Lumbar Spine Wo Contrast  Result Date: 05/09/2021 CLINICAL DATA:  Low back pain, can not walk and can not feel her legs, generalized weakness since the nineteenth EXAM: CT THORACIC AND LUMBAR SPINE WITHOUT CONTRAST TECHNIQUE: Multidetector CT imaging of the thoracic and lumbar spine was performed without contrast. Multiplanar CT image reconstructions were also generated. COMPARISON:  CT abdomen/pelvis 06/27/2017 FINDINGS: CT THORACIC SPINE FINDINGS Alignment: There is dextroscoliosis of the thoracic spine centered at T11. There is no antero or retrolisthesis. Vertebrae: The bones are diffusely demineralized. Vertebral body heights are preserved. There is no evidence of acute fracture. There is no suspicious osseous lesion. Paraspinal and other soft tissues: The paraspinal soft tissues are  unremarkable. There is dependent subsegmental atelectasis in the lung bases. There is a small hiatal hernia. Disc levels: There is mild multilevel degenerative change throughout the thoracic spine. The osseous spinal canal and neural foramina are patent. CT LUMBAR SPINE FINDINGS Segmentation: Standard; the lowest disc space is designated L5-S1. Alignment: There is grade 1 retrolisthesis of L2 on L3 and L3 on L4 measuring 5 mm at both levels, unchanged compared to the CT abdomen/pelvis of 06/27/2017. Alignment is otherwise normal. Vertebrae: The bones are diffusely demineralized. Vertebral body heights are preserved. There is no evidence of acute fracture. There is no suspicious osseous lesion. Paraspinal and other soft tissues: Small radiodense foci in the subcutaneous fat over the right back are unchanged since 2018. The paraspinal soft tissues are  otherwise unremarkable. There is calcified atherosclerotic plaque of the abdominal aorta. A curvilinear radiodense object in the large bowel is of uncertain etiology but may reflect ingested material. There is no associated inflammatory change or evidence of obstruction. Disc levels: There is mild multilevel intervertebral disc space narrowing. There is multilevel facet arthropathy, most advanced at L5-S1 on the left. There is advanced degenerative endplate change with endplate osteophytes and endplate irregularity at L1-L2 and L2-L3, progressed since 2018. There is probably at least moderate spinal canal stenosis at L2-L3 due to the retrolisthesis of L2 on L3. The osseous spinal canal is otherwise patent. There is severe right neural foraminal stenosis at L3-L4. The other neural foramina appear grossly patent. IMPRESSION: CT THORACIC SPINE IMPRESSION 1. No acute fracture or traumatic malalignment of the thoracic spine. 2. Dextroscoliosis centered at T11. CT LUMBAR SPINE IMPRESSION 1. No acute fracture or traumatic malalignment of the lumbar spine. 2. Grade 1  retrolisthesis of L2 on L3 is similar to the prior CT abdomen/pelvis from 06/27/2017. There is likely at least moderate spinal canal stenosis at L2-L3 due to the retrolisthesis. MRI may be considered to better evaluate for cauda equina nerve root impingement. 3. Severe right neural foraminal stenosis at L3-L4. 4. A curvilinear radiodense object in the large bowel is of uncertain etiology but may reflect ingested material. There is no associated inflammatory change or evidence of obstruction. Electronically Signed   By: Valetta Mole M.D.   On: 05/09/2021 17:58   DG Chest Port 1 View  Result Date: 05/09/2021 CLINICAL DATA:  Weakness.  Hypertension. EXAM: PORTABLE CHEST 1 VIEW COMPARISON:  06/17/2018 FINDINGS: Midline trachea. Mild cardiomegaly. Tortuous thoracic aorta. No pleural effusion or pneumothorax. Clear lungs. No congestive failure. IMPRESSION: Cardiomegaly without congestive failure. Electronically Signed   By: Abigail Miyamoto M.D.   On: 05/09/2021 18:09   ECHOCARDIOGRAM COMPLETE  Result Date: 05/10/2021    ECHOCARDIOGRAM REPORT   Patient Name:   Melissa Murphy Date of Exam: 05/10/2021 Medical Rec #:  [id number]        Height:       64.0 in Accession #:    [id number]       Weight:       180.8 lb Date of Birth:  07-Nov-1935        BSA:          1.874 m Patient Age:    79 years         BP:           148/68 mmHg Patient Gender: F                HR:           67 bpm. Exam Location:  Inpatient Procedure: 2D Echo, Cardiac Doppler and Color Doppler Indications:    Elevated Troponin  History:        Patient has no prior history of Echocardiogram examinations.                 Risk Factors:Hypertension, Dyslipidemia and Sleep Apnea. History                 of DVT. Thyroid disease. GERD.  Sonographer:    Darlina Sicilian RDCS Referring Phys: [id number] [place]  1. Left ventricular ejection fraction, by estimation, is 70 to 75%. The left ventricle has hyperdynamic function. The left ventricle has no  regional wall motion abnormalities. There is mild left ventricular hypertrophy of the septal segment. Left ventricular diastolic  parameters are consistent with Grade I diastolic dysfunction (impaired relaxation).  2. Right ventricular systolic function is normal. The right ventricular size is normal. There is mildly elevated pulmonary artery systolic pressure. The estimated right ventricular systolic pressure is 99.2 mmHg.  3. The mitral valve is normal in structure. No evidence of mitral valve regurgitation. No evidence of mitral stenosis.  4. The aortic valve is normal in structure. Aortic valve regurgitation is mild. No aortic stenosis is present.  5. The inferior vena cava is normal in size with greater than 50% respiratory variability, suggesting right atrial pressure of 3 mmHg. FINDINGS  Left Ventricle: Left ventricular ejection fraction, by estimation, is 70 to 75%. The left ventricle has hyperdynamic function. The left ventricle has no regional wall motion abnormalities. The left ventricular internal cavity size was normal in size. There is mild left ventricular hypertrophy of the septal segment. Left ventricular diastolic parameters are consistent with Grade I diastolic dysfunction (impaired relaxation). Right Ventricle: The right ventricular size is normal. No increase in right ventricular wall thickness. Right ventricular systolic function is normal. There is mildly elevated pulmonary artery systolic pressure. The tricuspid regurgitant velocity is 2.85  m/s, and with an assumed right atrial pressure of 8 mmHg, the estimated right ventricular systolic pressure is 42.6 mmHg. Left Atrium: Left atrial size was normal in size. Right Atrium: Right atrial size was normal in size. Pericardium: There is no evidence of pericardial effusion. Mitral Valve: The mitral valve is normal in structure. No evidence of mitral valve regurgitation. No evidence of mitral valve stenosis. Tricuspid Valve: The tricuspid valve is  normal in structure. Tricuspid valve regurgitation is mild . No evidence of tricuspid stenosis. Aortic Valve: The aortic valve is normal in structure. Aortic valve regurgitation is mild. Aortic regurgitation PHT measures 752 msec. No aortic stenosis is present. Pulmonic Valve: The pulmonic valve was normal in structure. Pulmonic valve regurgitation is not visualized. No evidence of pulmonic stenosis. Aorta: The aortic root is normal in size and structure. Venous: The inferior vena cava is normal in size with greater than 50% respiratory variability, suggesting right atrial pressure of 3 mmHg. IAS/Shunts: No atrial level shunt detected by color flow Doppler.  LEFT VENTRICLE PLAX 2D LVIDd:         3.50 cm  Diastology LVIDs:         2.60 cm  LV e' medial:    5.26 cm/s LV PW:         0.80 cm  LV E/e' medial:  10.3 LV IVS:        1.45 cm  LV e' lateral:   5.06 cm/s LVOT diam:     2.00 cm  LV E/e' lateral: 10.7 LV SV:         65 LV SV Index:   35 LVOT Area:     3.14 cm  RIGHT VENTRICLE RV S prime:     7.73 cm/s TAPSE (M-mode): 1.8 cm LEFT ATRIUM           Index LA diam:      3.70 cm 1.97 cm/m LA Vol (A4C): 29.6 ml 15.79 ml/m  AORTIC VALVE LVOT Vmax:   110.00 cm/s LVOT Vmean:  70.400 cm/s LVOT VTI:    0.207 m AI PHT:      752 msec  AORTA Ao Root diam: 3.20 cm Ao Asc diam:  3.20 cm MITRAL VALVE               TRICUSPID VALVE MV Area (PHT):  2.72 cm    TR Peak grad:   32.5 mmHg MV Decel Time: 279 msec    TR Vmax:        285.00 cm/s MV E velocity: 54.00 cm/s MV A velocity: 89.35 cm/s  SHUNTS MV E/A ratio:  0.60        Systemic VTI:  0.21 m                            Systemic Diam: 2.00 cm Candee Furbish MD Electronically signed by Candee Furbish MD Signature Date/Time: 05/10/2021/11:17:24 AM    Final      Scheduled Meds:    amLODipine  2.5 mg Oral Daily   cloNIDine  0.1 mg Oral BID   [START ON 05/11/2021] furosemide  20 mg Oral Daily   latanoprost  1 drop Both Eyes QHS   metoprolol tartrate  50 mg Oral BID   polyethylene  glycol  17 g Oral BID   senna  2 tablet Oral Daily   simvastatin  20 mg Oral QHS    Continuous Infusions:    lactated ringers       LOS: 1 day     Vernell Leep, MD, [place], [facility]. [facility]    To contact the attending provider between 7A-7P or the covering provider during after hours 7P-7A, please log into the web site [email or web address] and access using universal [place] password for that web site. If you do not have the password, please call the hospital operator.  05/10/2021, 3:08 PM

## 2021-05-10 NOTE — Progress Notes (Signed)
Pt arrived to unit from ED on stretcher. Slid to bed w/ 4 assist. VSS. Pt oriented to callbell and environment and POC discussed using ASL interpreter. Pt denies CP/discomfort at present. Callbell in [place]aw[place].

## 2021-05-10 NOTE — TOC Initial Note (Signed)
Transition of Care [facility]) - Initial/Assessment Note    Patient Details  Name: Melissa Murphy MRN: [id number] Date of Birth: March 26, 1936  Transition of [facility]) CM/SW Contact:    Dessa Phi, RN Phone Number: 05/10/2021, 3:50 PM  Clinical Narrative: Patient uses sign language,spouse-deaf,poor memory, dtr Vinnie Level contact person-left vm. D/c plan home.                   Expected Discharge Plan: Home/Self Care Barriers to Discharge: Continued Medical Work up   Patient Goals and CMS Choice Patient states their goals for this hospitalization and ongoing recovery are:: go home CMS Medicare.gov Compare Post Acute Care list provided to:: Patient Represenative (must comment) Vinnie Level dtr [phone]) Choice offered to / list presented to : Adult Children  Expected Discharge Plan and Services Expected Discharge Plan: Home/Self Care   Discharge Planning Services: CM Consult   Living arrangements for the past 2 months: Single Family Home                                      Prior Living Arrangements/Services Living arrangements for the past 2 months: Single Family Home Lives with:: Spouse Patient language and need for interpreter reviewed:: Yes Do you feel safe going back to the place where you live?: Yes      Need for Family Participation in Patient Care: No (Comment) Care giver support system in place?: Yes (comment)   Criminal Activity/Legal Involvement Pertinent to Current Situation/Hospitalization: No - Comment as needed  Activities of Daily Living Home Assistive Devices/Equipment: Dentures (specify type), Eyeglasses, Cane (specify quad or straight), Blood pressure cuff, Walker (specify type), Other (Comment) (walk-in shower, standard height toilet, front and 4 wheeled walker, narrow based quad cane, upper/lower dentures) ADL Screening (condition at time of admission) Patient's cognitive ability adequate to safely complete daily activities?: Yes Is the patient deaf  or have difficulty hearing?: Yes (patient is deaf-needs sign language interpreter or daughter) Does the patient have difficulty seeing, even when wearing glasses/contacts?: Yes (has glaucoma) Does the patient have difficulty concentrating, remembering, or making decisions?: No Patient able to express need for assistance with ADLs?: Yes Does the patient have difficulty dressing or bathing?: Yes Independently performs ADLs?: No (worsening weakness and "can't feel legs") Communication: Independent Dressing (OT): Needs assistance Is this a change from baseline?: Change from baseline, expected to last >3 days Grooming: Needs assistance Is this a change from baseline?: Change from baseline, expected to last >3 days Feeding: Needs assistance Is this a change from baseline?: Change from baseline, expected to last >3 days Bathing: Needs assistance Is this a change from baseline?: Change from baseline, expected to last >3 days Toileting: Dependent Is this a change from baseline?: Change from baseline, expected to last >3days In/Out Bed: Dependent Is this a change from baseline?: Change from baseline, expected to last >3 days Walks in Home: Dependent Is this a change from baseline?: Change from baseline, expected to last >3 days Does the patient have difficulty walking or climbing stairs?: Yes (secondary to worsening weakness and "can't feel legs") Weakness of Legs: Both Weakness of Arms/Hands: None  Permission Sought/Granted Permission sought to share information with : Case Manager Permission granted to share information with : Yes, Verbal Permission Granted  Share Information with NAME: Case Manager     Permission granted to share info w RelationshipVinnie Level dtr [phone]  Emotional Assessment Appearance:: Appears stated age Attitude/Demeanor/Rapport: Gracious Affect (typically observed): Accepting Orientation: : Oriented to Self, Oriented to Place, Oriented to  Time, Oriented to  Situation Alcohol / Substance Use: Not Applicable Psych Involvement: No (comment)  Admission diagnosis:  Weakness [R53.1] NSTEMI (non-ST elevated myocardial infarction) [facility]) [I21.4] Patient Active Problem List   Diagnosis Date Noted   Pressure injury of skin 05/10/2021   Acute low back pain 05/10/2021   Weakness of both legs 05/09/2021   Lumbago 05/09/2021   DDD (degenerative disc disease), lumbar 05/09/2021   Constipation 05/09/2021   Essential hypertension 04/17/2019   History of recurrent deep vein thrombosis (DVT) 04/17/2019   Peripheral edema 04/17/2019   Dyslipidemia 04/17/2019   Long term current use of anticoagulant therapy 04/17/2019   Deafness 04/03/2014   Insomnia 04/01/2014   Hiatal hernia 12/08/2013   Medication monitoring encounter 09/25/2013   Obstructive sleep apnea 01/10/2013   Uterine polyp    PCP:  Yvonna Alanis, NP Pharmacy:   [id number] [address] [place] [address] Phone: [phone] Fax: [phone]     Social Determinants of Health (SDOH) Interventions    Readmission Risk Interventions No flowsheet data found.

## 2021-05-10 NOTE — ED Notes (Signed)
Pharmacy notified about giving BP meds before 1000. Pharmacy is not sure that she is on all meds and is attempting to verify.

## 2021-05-10 NOTE — ED Notes (Signed)
Patient pulled IV out while using bedside commode large hematoma to the left hand.

## 2021-05-10 NOTE — Evaluation (Signed)
Physical Therapy Evaluation Patient Details Name: Melissa Murphy MRN: [id number] DOB: Jul 18, 1936 Today's Date: 05/10/2021  History of Present Illness   Patient is 85 y.o. female admitted due to acute low back pain, ambulatory dysfunction and elevated troponins. She reports episode at home where she tried to stand up from the couch and was unable to, felt a sudden onset of low back pain which worsened with movement, walking and standing up.  Over the next few days, noted increasing weakness of her legs and not being able to ambulate. PMH significant for HTN, DVT no longer on anticoagulation, HLD, OA, osteoporosis, thyroid disease, glaucoma, GERD, and is Deaf.    Clinical Impression  Melissa Murphy is 85 y.o. female admitted with above HPI and diagnosis. Patient is currently limited by functional impairments below (see PT problem list). Patient lives with her husband and per chart review is typically independent at baseline. Currently pt greatly limited by back pain with mobility and requires Mod-Max +2 assist. Patient will benefit from continued skilled PT interventions to address impairments and progress independence with mobility, recommending SNF with 24/7 assist. Acute PT will follow and progress as able.        Recommendations for follow up therapy are one component of a multi-disciplinary discharge planning process, led by the attending physician.  Recommendations may be updated based on patient status, additional functional criteria and insurance authorization.  Follow Up Recommendations  SNF    Equipment Recommendations    TBA   Recommendations for Other Services   OT Consult    Precautions / Restrictions         05/10/21 1113  PT Visit Information  Last PT Received On 05/10/21  Assistance Needed +1  History of Present Illness Patient is 85 y.o. female admitted due to acute low back pain, ambulatory dysfunction and elevated troponins. She reports episode at home where she  tried to stand up from the couch and was unable to, felt a sudden onset of low back pain which worsened with movement, walking and standing up.  Over the next few days, noted increasing weakness of her legs and not being able to ambulate. PMH significant for HTN, DVT no longer on anticoagulation, HLD, OA, osteoporosis, thyroid disease, glaucoma, GERD, and is Deaf.  Precautions  Precautions Fall  Restrictions  Weight Bearing Restrictions No  Home Living  Family/patient expects to be discharged to: Private residence  Living Arrangements Spouse/significant other  Available Help at Discharge Family  Additional Comments limited home set up due to use of interpretter, Rn/NT present and urgent need for pt to use [facility]  Prior Function  Level of Independence Independent  Communication  Communication Prefers language other than [place];Interpreter utilized (ASL, Technical brewer used)  Pain Assessment  Pain Assessment Faces  Faces Pain Scale 8  Pain Location back with mobility  Pain Descriptors / Indicators Discomfort;Grimacing;Moaning  Pain Intervention(s) Monitored during session;Repositioned  Cognition  Arousal/Alertness Awake/alert  Behavior During Therapy WFL for tasks assessed/performed  Overall Cognitive Status Within Functional Limits for tasks assessed  Upper Extremity Assessment  Upper Extremity Assessment Overall WFL for tasks assessed;Defer to OT evaluation  Lower Extremity Assessment  Lower Extremity Assessment Generalized weakness (difficult to assess due to pain)  Cervical / Trunk Assessment  Cervical / Trunk Assessment Lordotic;Other exceptions  Cervical / Trunk Exceptions pt with notable lordosis of lumbar spine  Bed Mobility  Overal bed mobility Needs Assistance  Bed Mobility Supine to Sit;Sit to Supine;Rolling  Rolling Mod assist  Supine to sit  Max assist;+2 for physical assistance;+2 for safety/equipment;HOB elevated;Mod assist  Sit to supine Max assist;+2 for  safety/equipment;+2 for physical assistance;HOB elevated;Mod assist  General bed mobility comments Mod-Max assist to mobilize to EOB. pt reluctant to mobilize without second person available and severly limited by back pain with LE mobility and turn to move to edge. After return to supine 2+ to reposition and Mod assist to roll in bed for pad repositioning.  Transfers  Overall transfer level Needs assistance  Equipment used Rolling walker (2 wheeled)  Transfers Sit to/from [employer] Transfers  Sit to Stand Mod assist;+2 safety/equipment;From elevated surface  Stand pivot transfers Mod assist;From elevated surface  General transfer comment Mod +2 assist to rise from EOB and mange walker to sequence steps to move bed<>BSC. Mod assist with tactile cues for sequencing reach back to sit on BSC and for hand placement with power up. communcation limited as pt didn't want interpretter visible for toileting. pt refused to move to recliner at EOS and sat back on EOB after taking small steps with RW and Mod assist.  Balance  Overall balance assessment Needs assistance  Sitting-balance support Feet supported  Sitting balance-Leahy Scale Poor  Standing balance support During functional activity;Bilateral upper extremity supported  Standing balance-Leahy Scale Poor  Standing balance comment heavy reliance on RW  PT - End of Session  Equipment Utilized During Treatment Gait belt  Activity Tolerance Patient limited by pain  Patient left in bed;with call bell/phone within reach;with bed alarm set;with nursing/sitter in room  Nurse Communication Mobility status  PT Assessment  PT Recommendation/Assessment Patient needs continued PT services  PT Visit Diagnosis Unsteadiness on feet (R26.81);Muscle weakness (generalized) (M62.81);History of falling (Z91.81);Difficulty in walking, not elsewhere classified (R26.2);Pain  Pain - Right/Left  (back)  PT Problem List Decreased strength;Decreased range of  motion;Decreased activity tolerance;Decreased mobility;Decreased balance;Decreased knowledge of use of DME;Obesity;Pain  PT Plan  PT Frequency (ACUTE ONLY) Min 3X/week  PT Treatment/Interventions (ACUTE ONLY) DME instruction;Gait training;Functional mobility training;Therapeutic activities;Therapeutic exercise;Balance training;Patient/family education  AM-PAC PT "6 Clicks" Mobility Outcome Measure (Version 2)  Help needed turning from your back to your side while in a flat bed without using bedrails? 2  Help needed moving from lying on your back to sitting on the side of a flat bed without using bedrails? 1  Help needed moving to and from a bed to a chair (including a wheelchair)? 1  Help needed standing up from a chair using your arms (e.g., wheelchair or bedside chair)? 1  Help needed to walk in hospital room? 1  Help needed climbing 3-5 steps with a railing?  1  6 Click Score 7  Consider Recommendation of Discharge To: CIR/SNF/LTACH  Progressive Mobility  What is the highest level of mobility based on the progressive mobility assessment? Level 2 (Chairfast) - Balance while sitting on edge of bed and cannot stand  Mobility Sit up in bed/chair position for meals;Out of bed for toileting  PT Recommendation  Recommendations for Other Services OT consult  Follow Up Recommendations SNF;Supervision/Assistance - 24 hour  PT equipment  (TBA)  Individuals Consulted  Consulted and Agree with Results and Recommendations Patient  Acute Rehab PT Goals  Patient Stated Goal back to stop hurting and be able to walk  PT Goal Formulation With patient  Time For Goal Achievement 05/24/21  Potential to Achieve Goals Fair  PT Time Calculation  PT Start Time (ACUTE ONLY) 1447  PT Stop Time (ACUTE ONLY) 1532  PT Time Calculation (min) (  ACUTE ONLY) 45 min  PT General Charges  $$ ACUTE PT VISIT 1 Visit  PT Evaluation  $PT Eval Moderate Complexity 1 Mod  PT Treatments  $Therapeutic Activity 23-37 mins     Verner Mould, DPT Acute Rehabilitation Services Office [phone] Pager [phone]   Jacques Navy 05/10/2021, 7:13 PM

## 2021-05-10 NOTE — ED Notes (Addendum)
Dr. Algis Liming made aware of pt BP increasing and that there are no orders PRN meds in for her BP.

## 2021-05-10 NOTE — Progress Notes (Signed)
  2D Echocardiogram has been performed.  Darlina Sicilian M 05/10/2021, 9:26 AM

## 2021-05-10 NOTE — Progress Notes (Signed)
ANTICOAGULATION CONSULT NOTE - Initial Consult  Pharmacy Consult for Enoxaparin Indication: VTE prophylaxis  Allergies  Allergen Reactions   Avastin [Bevacizumab] Nausea And Vomiting   Clindamycin/Lincomycin Itching   Novocain [Procaine] Other (See Comments)    Seizures    Sulfa Antibiotics Other (See Comments)    Causes fevers    Patient Measurements: Height: 5\' 4"  (162.6 cm) Weight: 82 kg (180 lb 12.4 oz) IBW/kg (Calculated) : 54.7  Vital Signs: Temp: 98.4 F (36.9 C) (09/27 1323) Temp Source: Oral (09/27 1323) BP: 152/66 (09/27 1323) Pulse Rate: 67 (09/27 1323)  Labs: Recent Labs    05/09/21 1750 05/09/21 1907 05/10/21 0615  HGB 12.7  --  12.3  HCT 37.5  --  37.0  PLT 200  --  184  LABPROT 14.1  --   --   INR 1.1  --   --   CREATININE 0.60  --  0.50  TROPONINIHS 117* 106* 65*    Estimated Creatinine Clearance: 56.1 mL/min (by C-G formula based on SCr of 0.5 mg/dL).   Medical History: Past Medical History:  Diagnosis Date   Anemia    Arthritis    Cataract    Clotting disorder ([place])    Deaf    Congenital; Requires an interpreter   DVT (deep venous thrombosis) (HCC)    in eye, and leg   Edema    GERD (gastroesophageal reflux disease)    Glaucoma    Hernia    Hiatal hernia    Hyperlipidemia    Hypertension    Osteoporosis    Sleep apnea    Thyroid disease    Ulcer    Urinary incontinence    Uterine polyp     Medications:  No longer on Xarelto per review of home med rec  Assessment: 85 yo F with medical history significant for HTN, DVT no longer on anticoagulation, HLD, OA admitted due to acute low back pain, ambulatory dysfunciton and elevated troponins.  Pharmacy consulted to dose enoxaparin for VTE prophylaxis.  BMI > 30 and CrCl > 30 ml/min.  Goal of Therapy:  Anti-Xa level 0.6-1 units/ml 4hrs after LMWH dose given Monitor platelets by anticoagulation protocol: Yes   Plan:  Lovenox 0.5 mg/kg (40 mg) subq every 24 hours Monitor  daily CBC, s/s bleeding   Royetta Asal, PharmD, [place] Please utilize Amion for appropriate phone number to reach the unit pharmacist ([place]) 05/10/2021 3:58 PM

## 2021-05-10 NOTE — ED Notes (Signed)
Message sent to inpatient nurse.

## 2021-05-10 NOTE — ED Notes (Signed)
Echo at bedside

## 2021-05-11 ENCOUNTER — Inpatient Hospital Stay (HOSPITAL_COMMUNITY): Payer: Medicare Other

## 2021-05-11 DIAGNOSIS — N390 Urinary tract infection, site not specified: Secondary | ICD-10-CM

## 2021-05-11 LAB — URINE CULTURE: Culture: 70000 — AB

## 2021-05-11 LAB — C-REACTIVE PROTEIN: CRP: 1.6 mg/dL — ABNORMAL HIGH (ref <1.0)

## 2021-05-11 LAB — SEDIMENTATION RATE: Sed Rate: 26 mm/hr — ABNORMAL HIGH (ref 0–22)

## 2021-05-11 NOTE — NC FL2 (Signed)
[place] LEVEL OF CARE SCREENING TOOL     IDENTIFICATION  Patient Name: Melissa Murphy Birthdate: 05-18-1936 Sex: female Admission Date (Current Location): 05/09/2021  [facility] and [place] Number:  Herbalist and Address:  [facility],  [place], [place]      Provider Number: [id number]  Attending Physician Name and Address:  Mariel Aloe, MD  Relative Name and Phone Number:  Lesle Reek dtr [phone]    Current Level of Care: Hospital Recommended Level of Care: [place] Prior Approval Number:    Date Approved/Denied:   PASRR Number: [id number]  Discharge Plan: SNF    Current Diagnoses: Patient Active Problem List   Diagnosis Date Noted   Pressure injury of skin 05/10/2021   Acute low back pain 05/10/2021   Weakness of both legs 05/09/2021   Lumbago 05/09/2021   DDD (degenerative disc disease), lumbar 05/09/2021   Constipation 05/09/2021   Essential hypertension 04/17/2019   History of recurrent deep vein thrombosis (DVT) 04/17/2019   Peripheral edema 04/17/2019   Dyslipidemia 04/17/2019   Long term current use of anticoagulant therapy 04/17/2019   Deafness 04/03/2014   Insomnia 04/01/2014   Hiatal hernia 12/08/2013   Medication monitoring encounter 09/25/2013   Obstructive sleep apnea 01/10/2013   Uterine polyp     Orientation RESPIRATION BLADDER Height & Weight     Self, Time, Situation  Normal   Weight: 82 kg Height:  5\' 4"  (162.6 cm)  BEHAVIORAL SYMPTOMS/MOOD NEUROLOGICAL BOWEL NUTRITION STATUS      Continent Diet (Heart Healthy)  AMBULATORY STATUS COMMUNICATION OF NEEDS Skin   Limited Assist Verbally (sign language-paper/pen) PU Stage and Appropriate Care     PU Stage 3 Dressing: Daily (L gluteal cleft barrier cream)                 Personal Care Assistance Level of Assistance  Bathing, Feeding, Dressing   Feeding assistance: Limited assistance Dressing  Assistance: Limited assistance     Functional Limitations Info  Sight, Hearing, Speech Sight Info: Impaired (eyeglasses) Hearing Info: Impaired (deaf;sign language/paper, & pen) Speech Info: Impaired (sign language/paper, & pen)    SPECIAL CARE FACTORS FREQUENCY  PT (By licensed PT), OT (By licensed OT)     PT Frequency:  (5x week) OT Frequency:  (5x week)            Contractures Contractures Info: Not present    Additional Factors Info  Code Status, Allergies Code Status Info:  (Full) Allergies Info:  (Avastin (Bevacizumab), Clindamycin/lincomycin, Novocain (Procaine), Sulfa Antibiotics)           Current Medications (05/11/2021):  This is the current hospital active medication list Current Facility-Administered Medications  Medication Dose Route Frequency Provider Last Rate Last Admin   acetaminophen (TYLENOL) tablet 650 mg  650 mg Oral Q6H PRN Chotiner, Yevonne Aline, MD   650 mg at 05/11/21 1019   Or   acetaminophen (TYLENOL) suppository 650 mg  650 mg Rectal Q6H PRN Chotiner, Yevonne Aline, MD       amLODipine (NORVASC) tablet 2.5 mg  2.5 mg Oral Daily Chotiner, Yevonne Aline, MD   2.5 mg at 05/11/21 1015   atorvastatin (LIPITOR) tablet 10 mg  10 mg Oral QHS Chotiner, Yevonne Aline, MD   10 mg at 05/10/21 2218   bisacodyl (DULCOLAX) suppository 10 mg  10 mg Rectal Daily PRN Modena Jansky, MD       cloNIDine (CATAPRES) tablet  0.1 mg  0.1 mg Oral BID Minda Ditto, RPH   0.1 mg at 05/11/21 1014   docusate sodium (COLACE) capsule 100 mg  100 mg Oral BID Vernell Leep D, MD   100 mg at 05/11/21 1014   enoxaparin (LOVENOX) injection 40 mg  40 mg Subcutaneous Daily Suzzanne Cloud, RPH   40 mg at 05/11/21 1016   furosemide (LASIX) tablet 20 mg  20 mg Oral Daily Vernell Leep D, MD   20 mg at 05/11/21 1014   hydrALAZINE (APRESOLINE) injection 5 mg  5 mg Intravenous Q6H PRN Hongalgi, Lenis Dickinson, MD       latanoprost (XALATAN) 0.005 % ophthalmic solution 1 drop  1 drop Both Eyes QHS  Chotiner, Yevonne Aline, MD   1 drop at 05/10/21 2232   metoprolol tartrate (LOPRESSOR) tablet 50 mg  50 mg Oral BID Minda Ditto, RPH   50 mg at 05/11/21 1013   polyethylene glycol (MIRALAX / GLYCOLAX) packet 17 g  17 g Oral BID Modena Jansky, MD   17 g at 05/11/21 1013   senna (SENOKOT) tablet 17.2 mg  2 tablet Oral Daily Modena Jansky, MD   17.2 mg at 05/11/21 1014     Discharge Medications: Please see discharge summary for a list of discharge medications.  Relevant Imaging Results:  Relevant Lab Results:   Additional Information [phone]  covid vaccine-Pfizer x2, Booster x1  Semaya Vida, [place], Therapist, sports

## 2021-05-11 NOTE — Progress Notes (Signed)
PROGRESS NOTE    Melissa Murphy  LZJ:[id number] DOB: 11/26/1935 DOA: 05/09/2021 PCP: Yvonna Alanis, NP   Brief Narrative: Melissa Murphy is a 85 y.o. female with a history of hypertension, DVT, hyperlipidemia, osteoarthritis.  Patient presented secondary to progressively worsening back pain and weakness that developed about 1 week prior.  Patient was admitted for acute lower back pain, ambulatory dysfunction and elevated troponin.  Troponin trended down with overall presentation not consistent with ACS but rather likely demand ischemia.  Back pain with improvement and imaging not consistent with definitive evidence of acute process.  MRI could not rule out possible discitis/osteomyelitis.   Assessment & Plan:   Principal Problem:   Acute low back pain Active Problems:   Deafness   Essential hypertension   Long term current use of anticoagulant therapy   Weakness of both legs   Lumbago   DDD (degenerative disc disease), lumbar   Constipation   Pressure injury of skin   Acute lumbar back pain Unsure of etiology. CT and MRI with evidence of degenerative disc disease but no definitive acute etiology. Pain has improved while admitted. PT/OT recommendations include SNF on discharge. -Continue Tylenol prn for pain -PT/OT  Ambulatory dysfunction Possibly secondary to pain related to back. Discussed with Neurosurgery, Dr. Ronnald Ramp who reviewed her imaging and does not think findings are related to patient's symptoms  Abnormal MRI findings Concern for possible discitis. Patient afebrile, no leukocytosis, not high risk for bacteremia. Discussed with neurosurgery who thinks osteomyelitis cannot be ruled out with MRI findings. -Sed rate, CRP, blood cultures; if correlation with discitis/osteomyelitis, then will consult IR for consideration of aspiration per Neurosurgery recommendations  Abnormal thyroid gland Noted on MRI.  Recommendation for ultrasound of thyroid -Obtain Thyroid  ultrasound  UTI Patient with symptoms of frequency/urgency. Urine culture significant for E. Coli -Start Ceftriaxone IV 2g pending blood culture blood draw  Demand ischemia Troponin of 117 > 106 > 65. No associated chest pain. Transthoracic Echocardiogram without WMA or LV dysfunction. EKG not consistent with ACS.  Left hand hematoma/bruising Likely related to IV infiltration. Improving.  Primary hypertension -Continue Clonidine, metoprolol, amlodipine -Continue hydralazine prn  Hyperlipidemia -Continue Lipitor  History of DVT Noted. Not on anticoagulation  Deafness Communication via ASL interpreter as able  Constipation -Continue Miralax, Colace, Senna -May need to give dulcolax suppository and/or enema  Pressure injury Initial concern. Per WOC, lesions not consistent with pressure injury.   DVT prophylaxis: Lovenox Code Status:   Code Status: Full Code Family Communication: None at bedside. Called daughter with no response Disposition Plan: Discharge to SNF likely in 2-3 days pending workup for possible discitis   Consultants:  Neurosurgery, Dr. Ronnald Ramp (curbside)  Procedures:  None  Antimicrobials: Ceftriaxone IV    Subjective:  Video interpreter services used. Amy #[id number] and [facility] #[id number]  Patient states she has numbness in her legs bilaterally with weakness. She states she has had small, hard stools and describes them as Rabbit pellets. Frustrated with the video interpreter.  Objective: Vitals:   05/10/21 2033 05/11/21 0510 05/11/21 1013 05/11/21 1206  BP: (!) 167/72 (!) 167/92  (!) 170/67  Pulse: 81 (!) 56 60 (!) 57  Resp:    17  Temp: 98.3 F (36.8 C) 98.5 F (36.9 C)  98.3 F (36.8 C)  TempSrc: Oral Oral  Oral  SpO2: 94% 98%  100%  Weight:      Height:        Intake/Output Summary (Last 24 hours) at 05/11/2021  [place] filed at 05/11/2021 0900 Gross per 24 hour  Intake 1264.17 ml  Output 800 ml  Net 464.17 ml   Filed Weights    05/09/21 1642 05/09/21 1647  Weight: 81.6 kg 82 kg    Examination:  General exam: Appears calm and comfortable  Respiratory system: Clear to auscultation. Respiratory effort normal. Cardiovascular system: S1 & S2 heard, RRR. No murmurs, rubs, gallops or clicks. Gastrointestinal system: Abdomen is nondistended, soft and nontender. No organomegaly or masses felt. Normal bowel sounds heard. Central nervous system: Alert and oriented. No focal neurological deficits. 4/5 BLE strength bilaterally, intact sensation. Could not elicit patellar reflexes secondary to patient having knee pain. Musculoskeletal: No edema. No calf tenderness Skin: No cyanosis. No rashes Psychiatry: Judgement and insight appear normal. Mood & affect appropriate.     Data Reviewed: I have personally reviewed following labs and imaging studies  CBC Lab Results  Component Value Date   WBC 8.5 05/10/2021   RBC 3.92 05/10/2021   HGB 12.3 05/10/2021   HCT 37.0 05/10/2021   MCV 94.4 05/10/2021   MCH 31.4 05/10/2021   PLT 184 05/10/2021   MCHC 33.2 05/10/2021   RDW 13.6 05/10/2021   LYMPHSABS 2.0 05/09/2021   MONOABS 0.5 05/09/2021   EOSABS 0.2 05/09/2021   BASOSABS 0.0 02/54/2706     Last metabolic panel Lab Results  Component Value Date   NA 139 05/10/2021   K 3.7 05/10/2021   CL 106 05/10/2021   CO2 25 05/10/2021   BUN 20 05/10/2021   CREATININE 0.50 05/10/2021   GLUCOSE 100 (H) 05/10/2021   GFRNONAA >60 05/10/2021   GFRAA 84 10/16/2019   CALCIUM 9.7 05/10/2021   PHOS 3.0 11/12/2017   PROT 6.7 05/09/2021   ALBUMIN 3.1 (L) 05/09/2021   LABGLOB 2.6 10/16/2019   AGRATIO 1.5 10/16/2019   BILITOT 0.8 05/09/2021   ALKPHOS 51 05/09/2021   AST 32 05/09/2021   ALT 21 05/09/2021   ANIONGAP 8 05/10/2021    CBG (last 3)  No results for input(s): GLUCAP in the last 72 hours.   GFR: Estimated Creatinine Clearance: 56.1 mL/min (by C-G formula based on SCr of 0.5 mg/dL).  Coagulation Profile: Recent  Labs  Lab 05/09/21 1750  INR 1.1    Recent Results (from the past 240 hour(s))  Resp Panel by RT-PCR (Flu A&B, Covid) Nasopharyngeal Swab     Status: None   Collection Time: 05/09/21  5:50 PM   Specimen: Nasopharyngeal Swab; Nasopharyngeal(NP) swabs in vial transport medium  Result Value Ref Range Status   SARS Coronavirus 2 by RT PCR NEGATIVE NEGATIVE Final    Comment: (NOTE) SARS-CoV-2 target nucleic acids are NOT DETECTED.  The SARS-CoV-2 RNA is generally detectable in upper respiratory specimens during the acute phase of infection. The lowest concentration of SARS-CoV-2 viral copies this assay can detect is 138 copies/mL. A negative result does not preclude SARS-Cov-2 infection and should not be used as the sole basis for treatment or other patient management decisions. A negative result may occur with  improper specimen collection/handling, submission of specimen other than nasopharyngeal swab, presence of viral mutation(s) within the areas targeted by this assay, and inadequate number of viral copies(<138 copies/mL). A negative result must be combined with clinical observations, patient history, and epidemiological information. The expected result is Negative.  Fact Sheet for Patients:  EntrepreneurPulse.com.au  Fact Sheet for Healthcare Providers:  IncredibleEmployment.be  This test is no t yet approved or cleared by the [place]  FDA and  has been authorized for detection and/or diagnosis of SARS-CoV-2 by FDA under an Emergency Use Authorization (EUA). This EUA will remain  in effect (meaning this test can be used) for the duration of the COVID-19 declaration under Section 564(b)(1) of the Act, 21 U.S.C.section 360bbb-3(b)(1), unless the authorization is terminated  or revoked sooner.       Influenza A by PCR NEGATIVE NEGATIVE Final   Influenza B by PCR NEGATIVE NEGATIVE Final    Comment: (NOTE) The Xpert Xpress  SARS-CoV-2/FLU/RSV plus assay is intended as an aid in the diagnosis of influenza from Nasopharyngeal swab specimens and should not be used as a sole basis for treatment. Nasal washings and aspirates are unacceptable for Xpert Xpress SARS-CoV-2/FLU/RSV testing.  Fact Sheet for Patients: EntrepreneurPulse.com.au  Fact Sheet for Healthcare Providers: IncredibleEmployment.be  This test is not yet approved or cleared by the [place] FDA and has been authorized for detection and/or diagnosis of SARS-CoV-2 by FDA under an Emergency Use Authorization (EUA). This EUA will remain in effect (meaning this test can be used) for the duration of the COVID-19 declaration under Section 564(b)(1) of the Act, 21 U.S.C. section 360bbb-3(b)(1), unless the authorization is terminated or revoked.  Performed at [facility], [place] [address], [place], [place] [address]   Urine Culture     Status: Abnormal   Collection Time: 05/09/21 10:30 PM   Specimen: In/Out Cath Urine  Result Value Ref Range Status   Specimen Description   Final    IN/OUT CATH URINE Performed at [place] [address], [place], [place] [address]    Special Requests   Final    NONE Performed at [facility], [place] [address], [place], [place] [address]    Culture 70,000 COLONIES/mL ESCHERICHIA COLI (A)  Final   Report Status 05/11/2021 FINAL  Final   Organism ID, Bacteria ESCHERICHIA COLI (A)  Final      Susceptibility   Escherichia coli - MIC*    AMPICILLIN >=32 RESISTANT Resistant     CEFAZOLIN >=64 RESISTANT Resistant     CEFEPIME <=0.12 SENSITIVE Sensitive     CEFTRIAXONE 0.5 SENSITIVE Sensitive     CIPROFLOXACIN <=0.25 SENSITIVE Sensitive     GENTAMICIN <=1 SENSITIVE Sensitive     IMIPENEM <=0.25 SENSITIVE Sensitive     NITROFURANTOIN <=16 SENSITIVE Sensitive     TRIMETH/SULFA <=20 SENSITIVE Sensitive      AMPICILLIN/SULBACTAM >=32 RESISTANT Resistant     PIP/TAZO 8 SENSITIVE Sensitive     * 70,000 COLONIES/mL ESCHERICHIA COLI        Radiology Studies: CT Thoracic Spine Wo Contrast  Result Date: 05/09/2021 CLINICAL DATA:  Low back pain, can not walk and can not feel her legs, generalized weakness since the nineteenth EXAM: CT THORACIC AND LUMBAR SPINE WITHOUT CONTRAST TECHNIQUE: Multidetector CT imaging of the thoracic and lumbar spine was performed without contrast. Multiplanar CT image reconstructions were also generated. COMPARISON:  CT abdomen/pelvis 06/27/2017 FINDINGS: CT THORACIC SPINE FINDINGS Alignment: There is dextroscoliosis of the thoracic spine centered at T11. There is no antero or retrolisthesis. Vertebrae: The bones are diffusely demineralized. Vertebral body heights are preserved. There is no evidence of acute fracture. There is no suspicious osseous lesion. Paraspinal and other soft tissues: The paraspinal soft tissues are unremarkable. There is dependent subsegmental atelectasis in the lung bases. There is a small hiatal hernia. Disc levels: There is mild multilevel degenerative change throughout the thoracic spine. The osseous spinal  canal and neural foramina are patent. CT LUMBAR SPINE FINDINGS Segmentation: Standard; the lowest disc space is designated L5-S1. Alignment: There is grade 1 retrolisthesis of L2 on L3 and L3 on L4 measuring 5 mm at both levels, unchanged compared to the CT abdomen/pelvis of 06/27/2017. Alignment is otherwise normal. Vertebrae: The bones are diffusely demineralized. Vertebral body heights are preserved. There is no evidence of acute fracture. There is no suspicious osseous lesion. Paraspinal and other soft tissues: Small radiodense foci in the subcutaneous fat over the right back are unchanged since 2018. The paraspinal soft tissues are otherwise unremarkable. There is calcified atherosclerotic plaque of the abdominal aorta. A curvilinear radiodense object  in the large bowel is of uncertain etiology but may reflect ingested material. There is no associated inflammatory change or evidence of obstruction. Disc levels: There is mild multilevel intervertebral disc space narrowing. There is multilevel facet arthropathy, most advanced at L5-S1 on the left. There is advanced degenerative endplate change with endplate osteophytes and endplate irregularity at L1-L2 and L2-L3, progressed since 2018. There is probably at least moderate spinal canal stenosis at L2-L3 due to the retrolisthesis of L2 on L3. The osseous spinal canal is otherwise patent. There is severe right neural foraminal stenosis at L3-L4. The other neural foramina appear grossly patent. IMPRESSION: CT THORACIC SPINE IMPRESSION 1. No acute fracture or traumatic malalignment of the thoracic spine. 2. Dextroscoliosis centered at T11. CT LUMBAR SPINE IMPRESSION 1. No acute fracture or traumatic malalignment of the lumbar spine. 2. Grade 1 retrolisthesis of L2 on L3 is similar to the prior CT abdomen/pelvis from 06/27/2017. There is likely at least moderate spinal canal stenosis at L2-L3 due to the retrolisthesis. MRI may be considered to better evaluate for cauda equina nerve root impingement. 3. Severe right neural foraminal stenosis at L3-L4. 4. A curvilinear radiodense object in the large bowel is of uncertain etiology but may reflect ingested material. There is no associated inflammatory change or evidence of obstruction. Electronically Signed   By: Valetta Mole M.D.   On: 05/09/2021 17:58   CT Lumbar Spine Wo Contrast  Result Date: 05/09/2021 CLINICAL DATA:  Low back pain, can not walk and can not feel her legs, generalized weakness since the nineteenth EXAM: CT THORACIC AND LUMBAR SPINE WITHOUT CONTRAST TECHNIQUE: Multidetector CT imaging of the thoracic and lumbar spine was performed without contrast. Multiplanar CT image reconstructions were also generated. COMPARISON:  CT abdomen/pelvis 06/27/2017  FINDINGS: CT THORACIC SPINE FINDINGS Alignment: There is dextroscoliosis of the thoracic spine centered at T11. There is no antero or retrolisthesis. Vertebrae: The bones are diffusely demineralized. Vertebral body heights are preserved. There is no evidence of acute fracture. There is no suspicious osseous lesion. Paraspinal and other soft tissues: The paraspinal soft tissues are unremarkable. There is dependent subsegmental atelectasis in the lung bases. There is a small hiatal hernia. Disc levels: There is mild multilevel degenerative change throughout the thoracic spine. The osseous spinal canal and neural foramina are patent. CT LUMBAR SPINE FINDINGS Segmentation: Standard; the lowest disc space is designated L5-S1. Alignment: There is grade 1 retrolisthesis of L2 on L3 and L3 on L4 measuring 5 mm at both levels, unchanged compared to the CT abdomen/pelvis of 06/27/2017. Alignment is otherwise normal. Vertebrae: The bones are diffusely demineralized. Vertebral body heights are preserved. There is no evidence of acute fracture. There is no suspicious osseous lesion. Paraspinal and other soft tissues: Small radiodense foci in the subcutaneous fat over the right back are unchanged since  2018. The paraspinal soft tissues are otherwise unremarkable. There is calcified atherosclerotic plaque of the abdominal aorta. A curvilinear radiodense object in the large bowel is of uncertain etiology but may reflect ingested material. There is no associated inflammatory change or evidence of obstruction. Disc levels: There is mild multilevel intervertebral disc space narrowing. There is multilevel facet arthropathy, most advanced at L5-S1 on the left. There is advanced degenerative endplate change with endplate osteophytes and endplate irregularity at L1-L2 and L2-L3, progressed since 2018. There is probably at least moderate spinal canal stenosis at L2-L3 due to the retrolisthesis of L2 on L3. The osseous spinal canal is  otherwise patent. There is severe right neural foraminal stenosis at L3-L4. The other neural foramina appear grossly patent. IMPRESSION: CT THORACIC SPINE IMPRESSION 1. No acute fracture or traumatic malalignment of the thoracic spine. 2. Dextroscoliosis centered at T11. CT LUMBAR SPINE IMPRESSION 1. No acute fracture or traumatic malalignment of the lumbar spine. 2. Grade 1 retrolisthesis of L2 on L3 is similar to the prior CT abdomen/pelvis from 06/27/2017. There is likely at least moderate spinal canal stenosis at L2-L3 due to the retrolisthesis. MRI may be considered to better evaluate for cauda equina nerve root impingement. 3. Severe right neural foraminal stenosis at L3-L4. 4. A curvilinear radiodense object in the large bowel is of uncertain etiology but may reflect ingested material. There is no associated inflammatory change or evidence of obstruction. Electronically Signed   By: Valetta Mole M.D.   On: 05/09/2021 17:58   MR THORACIC SPINE WO CONTRAST  Result Date: 05/10/2021 CLINICAL DATA:  Low back pain with possible cauda equina syndrome. EXAM: MRI THORACIC AND LUMBAR SPINE WITHOUT CONTRAST TECHNIQUE: Multiplanar and multiecho pulse sequences of the thoracic and lumbar spine were obtained without intravenous contrast. COMPARISON:  None. FINDINGS: MRI THORACIC SPINE FINDINGS Alignment: Physiologic. Vertebrae: Diffusely heterogeneous bone marrow signal. No compression fracture or other acute abnormality. Cord:  Normal Paraspinal and other soft tissues: Enlarged and heterogeneous right thyroid lobe, incompletely visualized. Disc levels: No spinal canal or neural foraminal stenosis. MRI LUMBAR SPINE FINDINGS Segmentation:  Standard Alignment:  L2-3 and L3-4 grade 1 retrolisthesis. Vertebrae: There is hyperintense T2-weighted signal within the L2-3 disc with accompanying signal changes in both endplates. Conus medullaris and cauda equina: Conus extends to the L2 level. Conus and cauda equina appear  normal. Paraspinal and other soft tissues: Paraspinous muscle atrophy. No prevertebral collection. Disc levels: L1-L2: Normal disc space and facet joints. No spinal canal stenosis. No neural foraminal stenosis. L2-L3: Intermediate sized disc bulge with endplate spurring and mild facet hypertrophy. Narrowing of both lateral recesses without central spinal canal stenosis. Mild bilateral neural foraminal stenosis. L3-L4: Large right asymmetric disc bulge with endplate spurring and moderate facet hypertrophy. No central spinal canal stenosis. Severe right neural foraminal stenosis. L4-L5: Small disc bulge with superimposed small central disc protrusion. No spinal canal stenosis. No neural foraminal stenosis. L5-S1: Mild central disc protrusion with mild facet hypertrophy. No spinal canal stenosis. No neural foraminal stenosis. Visualized sacrum: Normal. IMPRESSION: 1. L2-L3 disc and endplate signal changes, nonspecific. This may be degenerative, however, discitis-osteomyelitis may also cause this appearance. 2. Severe right L3-4 neural foraminal stenosis secondary to large right asymmetric disc bulge and endplate spurring. 3. Narrowing of both lateral recesses and mild bilateral neural foraminal stenosis at L2-L3. These findings could contribute to radicular symptoms in the L2 or L3 distributions. 4. No thoracic spinal canal or neural foraminal stenosis. 5. Diffusely heterogeneous bone marrow signal, nonspecific.  This can be seen in the setting of chronic anemia or smoking history. An infiltrative process such as metastatic disease is also a consideration. 6. Incidental heterogeneous and enlarged thyroid. Recommend thyroid US. Reference: J Am Coll Radiol. 2015 Feb;12(2): 143-50 Electronically Signed   By: Ulyses Jarred M.D.   On: 05/10/2021 19:39   MR LUMBAR SPINE WO CONTRAST  Result Date: 05/10/2021 CLINICAL DATA:  Low back pain with possible cauda equina syndrome. EXAM: MRI THORACIC AND LUMBAR SPINE WITHOUT  CONTRAST TECHNIQUE: Multiplanar and multiecho pulse sequences of the thoracic and lumbar spine were obtained without intravenous contrast. COMPARISON:  None. FINDINGS: MRI THORACIC SPINE FINDINGS Alignment: Physiologic. Vertebrae: Diffusely heterogeneous bone marrow signal. No compression fracture or other acute abnormality. Cord:  Normal Paraspinal and other soft tissues: Enlarged and heterogeneous right thyroid lobe, incompletely visualized. Disc levels: No spinal canal or neural foraminal stenosis. MRI LUMBAR SPINE FINDINGS Segmentation:  Standard Alignment:  L2-3 and L3-4 grade 1 retrolisthesis. Vertebrae: There is hyperintense T2-weighted signal within the L2-3 disc with accompanying signal changes in both endplates. Conus medullaris and cauda equina: Conus extends to the L2 level. Conus and cauda equina appear normal. Paraspinal and other soft tissues: Paraspinous muscle atrophy. No prevertebral collection. Disc levels: L1-L2: Normal disc space and facet joints. No spinal canal stenosis. No neural foraminal stenosis. L2-L3: Intermediate sized disc bulge with endplate spurring and mild facet hypertrophy. Narrowing of both lateral recesses without central spinal canal stenosis. Mild bilateral neural foraminal stenosis. L3-L4: Large right asymmetric disc bulge with endplate spurring and moderate facet hypertrophy. No central spinal canal stenosis. Severe right neural foraminal stenosis. L4-L5: Small disc bulge with superimposed small central disc protrusion. No spinal canal stenosis. No neural foraminal stenosis. L5-S1: Mild central disc protrusion with mild facet hypertrophy. No spinal canal stenosis. No neural foraminal stenosis. Visualized sacrum: Normal. IMPRESSION: 1. L2-L3 disc and endplate signal changes, nonspecific. This may be degenerative, however, discitis-osteomyelitis may also cause this appearance. 2. Severe right L3-4 neural foraminal stenosis secondary to large right asymmetric disc bulge and  endplate spurring. 3. Narrowing of both lateral recesses and mild bilateral neural foraminal stenosis at L2-L3. These findings could contribute to radicular symptoms in the L2 or L3 distributions. 4. No thoracic spinal canal or neural foraminal stenosis. 5. Diffusely heterogeneous bone marrow signal, nonspecific. This can be seen in the setting of chronic anemia or smoking history. An infiltrative process such as metastatic disease is also a consideration. 6. Incidental heterogeneous and enlarged thyroid. Recommend thyroid US. Reference: J Am Coll Radiol. 2015 Feb;12(2): 143-50 Electronically Signed   By: Ulyses Jarred M.D.   On: 05/10/2021 19:39   DG Chest Port 1 View  Result Date: 05/09/2021 CLINICAL DATA:  Weakness.  Hypertension. EXAM: PORTABLE CHEST 1 VIEW COMPARISON:  06/17/2018 FINDINGS: Midline trachea. Mild cardiomegaly. Tortuous thoracic aorta. No pleural effusion or pneumothorax. Clear lungs. No congestive failure. IMPRESSION: Cardiomegaly without congestive failure. Electronically Signed   By: Abigail Miyamoto M.D.   On: 05/09/2021 18:09   DG Abd 2 Views  Result Date: 05/11/2021 CLINICAL DATA:  Checking for foreign body and alimentary tract EXAM: ABDOMEN - 2 VIEW COMPARISON:  CT 06/27/2017 FINDINGS: Nonobstructive bowel gas pattern. No organomegaly or free air. Surgical clips noted in the region of the proximal stomach. Calcified phleboliths in the pelvis. No unexpected radiopaque foreign body. IMPRESSION: No evidence of unexpected radiopaque foreign body. No bowel obstruction or free air. Electronically Signed   By: Rolm Baptise M.D.   On: 05/11/2021  13:20   ECHOCARDIOGRAM COMPLETE  Result Date: 05/10/2021    ECHOCARDIOGRAM REPORT   Patient Name:   LELIA JONS Date of Exam: 05/10/2021 Medical Rec #:  [id number]        Height:       64.0 in Accession #:    [id number]       Weight:       180.8 lb Date of Birth:  05/18/1936        BSA:          1.874 m Patient Age:    82 years         BP:            148/68 mmHg Patient Gender: F                HR:           67 bpm. Exam Location:  Inpatient Procedure: 2D Echo, Cardiac Doppler and Color Doppler Indications:    Elevated Troponin  History:        Patient has no prior history of Echocardiogram examinations.                 Risk Factors:Hypertension, Dyslipidemia and Sleep Apnea. History                 of DVT. Thyroid disease. GERD.  Sonographer:    Darlina Sicilian RDCS Referring Phys: [id number] [place]  1. Left ventricular ejection fraction, by estimation, is 70 to 75%. The left ventricle has hyperdynamic function. The left ventricle has no regional wall motion abnormalities. There is mild left ventricular hypertrophy of the septal segment. Left ventricular diastolic parameters are consistent with Grade I diastolic dysfunction (impaired relaxation).  2. Right ventricular systolic function is normal. The right ventricular size is normal. There is mildly elevated pulmonary artery systolic pressure. The estimated right ventricular systolic pressure is 40.3 mmHg.  3. The mitral valve is normal in structure. No evidence of mitral valve regurgitation. No evidence of mitral stenosis.  4. The aortic valve is normal in structure. Aortic valve regurgitation is mild. No aortic stenosis is present.  5. The inferior vena cava is normal in size with greater than 50% respiratory variability, suggesting right atrial pressure of 3 mmHg. FINDINGS  Left Ventricle: Left ventricular ejection fraction, by estimation, is 70 to 75%. The left ventricle has hyperdynamic function. The left ventricle has no regional wall motion abnormalities. The left ventricular internal cavity size was normal in size. There is mild left ventricular hypertrophy of the septal segment. Left ventricular diastolic parameters are consistent with Grade I diastolic dysfunction (impaired relaxation). Right Ventricle: The right ventricular size is normal. No increase in right ventricular wall  thickness. Right ventricular systolic function is normal. There is mildly elevated pulmonary artery systolic pressure. The tricuspid regurgitant velocity is 2.85  m/s, and with an assumed right atrial pressure of 8 mmHg, the estimated right ventricular systolic pressure is 47.4 mmHg. Left Atrium: Left atrial size was normal in size. Right Atrium: Right atrial size was normal in size. Pericardium: There is no evidence of pericardial effusion. Mitral Valve: The mitral valve is normal in structure. No evidence of mitral valve regurgitation. No evidence of mitral valve stenosis. Tricuspid Valve: The tricuspid valve is normal in structure. Tricuspid valve regurgitation is mild . No evidence of tricuspid stenosis. Aortic Valve: The aortic valve is normal in structure. Aortic valve regurgitation is mild. Aortic regurgitation PHT measures 752 msec. No aortic stenosis is present.  Pulmonic Valve: The pulmonic valve was normal in structure. Pulmonic valve regurgitation is not visualized. No evidence of pulmonic stenosis. Aorta: The aortic root is normal in size and structure. Venous: The inferior vena cava is normal in size with greater than 50% respiratory variability, suggesting right atrial pressure of 3 mmHg. IAS/Shunts: No atrial level shunt detected by color flow Doppler.  LEFT VENTRICLE PLAX 2D LVIDd:         3.50 cm  Diastology LVIDs:         2.60 cm  LV e' medial:    5.26 cm/s LV PW:         0.80 cm  LV E/e' medial:  10.3 LV IVS:        1.45 cm  LV e' lateral:   5.06 cm/s LVOT diam:     2.00 cm  LV E/e' lateral: 10.7 LV SV:         65 LV SV Index:   35 LVOT Area:     3.14 cm  RIGHT VENTRICLE RV S prime:     7.73 cm/s TAPSE (M-mode): 1.8 cm LEFT ATRIUM           Index LA diam:      3.70 cm 1.97 cm/m LA Vol (A4C): 29.6 ml 15.79 ml/m  AORTIC VALVE LVOT Vmax:   110.00 cm/s LVOT Vmean:  70.400 cm/s LVOT VTI:    0.207 m AI PHT:      752 msec  AORTA Ao Root diam: 3.20 cm Ao Asc diam:  3.20 cm MITRAL VALVE                TRICUSPID VALVE MV Area (PHT): 2.72 cm    TR Peak grad:   32.5 mmHg MV Decel Time: 279 msec    TR Vmax:        285.00 cm/s MV E velocity: 54.00 cm/s MV A velocity: 89.35 cm/s  SHUNTS MV E/A ratio:  0.60        Systemic VTI:  0.21 m                            Systemic Diam: 2.00 cm Candee Furbish MD Electronically signed by Candee Furbish MD Signature Date/Time: 05/10/2021/11:17:24 AM    Final         Scheduled Meds:  amLODipine  2.5 mg Oral Daily   atorvastatin  10 mg Oral QHS   cloNIDine  0.1 mg Oral BID   docusate sodium  100 mg Oral BID   enoxaparin (LOVENOX) injection  40 mg Subcutaneous Daily   furosemide  20 mg Oral Daily   latanoprost  1 drop Both Eyes QHS   metoprolol tartrate  50 mg Oral BID   polyethylene glycol  17 g Oral BID   senna  2 tablet Oral Daily   Continuous Infusions:   LOS: 2 days     Cordelia Poche, MD [facility] 05/11/2021, 1:38 PM  If 7PM-7AM, please contact night-coverage [email or web address]

## 2021-05-11 NOTE — TOC Progression Note (Addendum)
Transition of Care [facility]) - Progression Note    Patient Details  Name: Melissa Murphy MRN: [id number] Date of Birth: [birth year]  [facility]) CM/SW Contact  Raziyah Vanvleck, Juliann Pulse, RN Phone Number: 05/11/2021, 11:40 AM  Clinical Narrative: Damaris Schooner to dtr Suzanne-agree to faxing out-await bed offers, MD notified to order covid. Await bed offers-wrote on paper w/pen for communication to patient in agreement for ST SNF.     Expected Discharge Plan: [place] Barriers to Discharge: Continued Medical Work up  Expected Discharge Plan and Services Expected Discharge Plan: [place]   Discharge Planning Services: CM Consult   Living arrangements for the past 2 months: Single Family Home                                       Social Determinants of Health (SDOH) Interventions    Readmission Risk Interventions No flowsheet data found.

## 2021-05-12 DIAGNOSIS — R9389 Abnormal findings on diagnostic imaging of other specified body structures: Secondary | ICD-10-CM | POA: Diagnosis not present

## 2021-05-12 DIAGNOSIS — M545 Low back pain, unspecified: Secondary | ICD-10-CM

## 2021-05-12 DIAGNOSIS — R29898 Other symptoms and signs involving the musculoskeletal system: Secondary | ICD-10-CM | POA: Diagnosis not present

## 2021-05-12 LAB — TSH: TSH: 1.848 u[IU]/mL (ref 0.350–4.500)

## 2021-05-12 MED ORDER — AMLODIPINE BESYLATE 5 MG PO TABS
5.0000 mg | ORAL_TABLET | Freq: Every day | ORAL | Status: DC
  Administered 2021-05-13: 5 mg via ORAL
  Filled 2021-05-12: qty 1

## 2021-05-12 MED ORDER — AMLODIPINE BESYLATE 5 MG PO TABS
2.5000 mg | ORAL_TABLET | Freq: Once | ORAL | Status: AC
  Administered 2021-05-12: 2.5 mg via ORAL
  Filled 2021-05-12: qty 1

## 2021-05-12 MED ORDER — SODIUM CHLORIDE 0.9 % IV SOLN
1.0000 g | Freq: Every day | INTRAVENOUS | Status: DC
  Administered 2021-05-12: 1 g via INTRAVENOUS
  Filled 2021-05-12 (×2): qty 10

## 2021-05-12 NOTE — TOC Progression Note (Addendum)
Transition of Care [facility]) - Progression Note    Patient Details  Name: Melissa Murphy MRN: [id number] Date of Birth: October 06, 1935  Transition of Care [facility]) CM/SW Contact  Faiga Stones, Juliann Pulse, RN Phone Number: 05/12/2021, 9:37 AM  Clinical Narrative:Used on site interpreter-Elaine to communicate to patient about bed offers-await choice-request for a video phone-Nsg charge nurse-Jessica informed to f/u if available.Patient agreed to talking to dtr Vinnie Level about choices-Suzanne will asst with calling facilities with questions to make final choice for ST SNF-all voiced understanding. Covid ordered.  Bed offers given await choice.  1. 1.2 mi [place] at [place], [place] [address] [phone] Overall rating Below average 2. 1.4 mi [place] at the [place], [place] [address] [phone] Overall rating Above average 3. [place], [place] [address] [phone] Overall rating Above average 4. 2.3 mi [place], [place] [address] [phone] Overall rating Much below average 5. 2.7 [place] [address] [place], [place] [address] [phone] Overall rating Much below average 6. 3.1 mi [place] and [place], [place] [address] [phone] Overall rating Above average 7. 3.1 mi [place] at [place], [place] [address] [phone] Overall rating Much below average 8. 3.7 mi [place] [address] [place], [place] [address] [phone] Overall rating Below average 9. 3.8 mi [facility] [place], [place] [address] [phone] Overall rating Below  average 10. 5.4 [place], [place] [address] [phone] Overall rating Below average 11. 5.7 mi [place] at [place], [place] [address] [phone] Overall rating Much above average 12. [place], [place] [address] [phone] Overall rating Above average 13. 7.2 mi [place], [place] [address] [phone] Overall rating Above average 14. 7.3 mi [facility] and [place], [place] [address] [phone] Overall rating Average 15. 8.2 [place], [place] [address] [phone] Overall rating Above average 16. 10.6 mi [facility] [place] [address] [place], [place] [address] [phone] Overall rating Average 17. 10.9 mi [facility] [address] [place], [place] [address] [phone] Overall rating Much above average 18. 12.5 mi [place] at [facility] [address] [place], [place] [address] [phone] Overall rating Much above average 19. 14.1 mi [facility] [address] [place], [place] [address] [phone] Overall rating Much below average 20. 14.5 [facility] [address] [place], [place] [address] [phone] Overall rating Much below average 21. [place],  [address] [phone] Overall rating Much above average 22. 15.6 mi The [place] [address] [place],  [address] [phone] Overall rating Much below average 23. 16.1 mi [facility] at [place],  [address] [phone] Overall rating Above average 24. 16.2 mi [facility] and  [facility] [place],  [address] [phone] Overall rating Much above average  25. 16.2 mi [place], [place] [address] [phone] Overall rating Below average 26. 16.3 mi [place] [address] [place], [place] [address] [phone] Overall rating Much below average 27. 16.9 mi [place] [address] [place], [place] [address] [phone] Overall rating Above average 28. 18.3 mi Materials engineer at [employer] at [facility], [place] [address] [phone] Overall rating Much above average 29. 15.0 [facility] [address] [place], [place] [address] [phone] Overall rating Much below average 30. 82.7 [facility] [address] [place], [place] [address] [phone] Overall rating Below average 31. 20.6 [place], [place] [address] [phone] Overall rating Much above average 32. 20.9 mi [facility] [address] [place], [place] [address] [phone] Overall rating Average 33. 20.9 mi [facility] [place], [place] [address] [phone] Overall rating Much below average 34. 21.4 mi [facility] and [facility] [address] [place], [place] [address] [phone] Overall rating Much below average 35. 21.7 mi [facility] [address] [place], [place] [address] [phone] Overall rating Above average 36. 21.8 [place], [place] [address] [phone] Overall rating Much below average 37. 23.2 [address] [place], [place] [address] [phone] Overall rating Below average 38. 23.3 mi [facility] [place], [place] [address] [phone] Overall rating Much below average 39. 23.6 [address] [place], [place] [address] [phone] Overall rating Much above average 40. 24.3 [facility] [address] [place], [place] [address] [phone] Overall rating Much below average 41. 24.7 mi [employer] [facility] [place],  [address] [phone] Overall rating Much below average To explore and dow      Expected Discharge Plan: [place] Barriers to Discharge: Continued Medical Work up  Expected Discharge Plan and Services Expected Discharge Plan: [place]   Discharge Planning Services: CM Consult   Living arrangements for the past 2 months: Single Family Home                                       Social Determinants of Health (SDOH) Interventions    Readmission Risk Interventions No flowsheet data found.

## 2021-05-12 NOTE — Progress Notes (Signed)
PROGRESS NOTE    Melissa Murphy  ZYS:[id number] DOB: [birth year] DOA: 05/09/2021 PCP: Melissa Alanis, NP   Brief Narrative: Melissa Murphy is a 85 y.o. female with a history of hypertension, DVT, hyperlipidemia, osteoarthritis.  Patient presented secondary to progressively worsening back pain and weakness that developed about 1 week prior.  Patient was admitted for acute lower back pain, ambulatory dysfunction and elevated troponin.  Troponin trended down with overall presentation not consistent with ACS but rather likely demand ischemia.  Back pain with improvement and imaging not consistent with definitive evidence of acute process.  MRI could not rule out possible discitis/osteomyelitis.   Assessment & Plan:   Principal Problem:   Acute low back pain Active Problems:   Deafness   Essential hypertension   Long term current use of anticoagulant therapy   Weakness of both legs   Lumbago   DDD (degenerative disc disease), lumbar   Constipation   Pressure injury of skin   Abnormal finding on imaging   Acute lumbar back pain Unsure of etiology. CT and MRI with evidence of degenerative disc disease/slipped disc but no definitive acute etiology. Pain has improved while admitted. PT/OT recommendations include SNF on discharge. -Continue Tylenol prn for pain -PT/OT  Ambulatory dysfunction Possibly secondary to pain related to back. Discussed with Neurosurgery, Dr. Ronnald Ramp who reviewed her imaging and does not think findings are related to patient's symptoms  Abnormal MRI findings Concern for possible discitis. Patient afebrile, no leukocytosis, not high risk for bacteremia. Discussed with neurosurgery who thinks osteomyelitis cannot be ruled out with MRI findings. Sed rate and CRP mildly elevated. ID consulted and recommendations for observation at this time; patient to follow-up as an outpatient. No empiric antibiotics recommended.  Abnormal thyroid gland Noted on MRI.   Recommendation for ultrasound of thyroid. Thyroid US significant for diffuse heterogeneity of thy thyroid parenchyma; no suspicious nodules. -TSH; if abnormal will obtain a free T4 and T3  UTI Patient with symptoms of frequency/urgency. Urine culture significant for E. Coli -Start Ceftriaxone IV 1g daily x3 days  Demand ischemia Troponin of 117 > 106 > 65. No associated chest pain. Transthoracic Echocardiogram without WMA or LV dysfunction. EKG not consistent with ACS.  Left hand hematoma/bruising Likely related to IV infiltration. Improving.  Primary hypertension -Continue Clonidine, metoprolol; increase to amlodipine 5 mg daily -Continue hydralazine prn  Hyperlipidemia -Continue Lipitor  History of DVT Noted. Not on anticoagulation  Deafness Communication via ASL interpreter as able  Constipation -Continue Miralax, Colace, Senna  Pressure injury Initial concern. Per WOC, lesions not consistent with pressure injury.   DVT prophylaxis: Lovenox Code Status:   Code Status: Full Code Family Communication: None at bedside. Disposition Plan: Discharge to SNF likely in 1-3 days pending bed   Consultants:  Neurosurgery, Dr. Ronnald Ramp (curbside) Infectious disease  Procedures:  None  Antimicrobials: Ceftriaxone IV    Subjective:  Bedside ASL interpreter used  Back pain is better today. Wondering why her blood pressure was so high.  Objective: Vitals:   05/12/21 0420 05/12/21 1014 05/12/21 1150 05/12/21 1358  BP: (!) 179/72 (!) 198/75 (!) 193/83 (!) 148/73  Pulse: (!) 55 65 (!) 56 (!) 56  Resp: 18  18   Temp: 98 F (36.7 C)  97.7 F (36.5 C)   TempSrc:   Oral   SpO2: 99%  100%   Weight:      Height:        Intake/Output Summary (Last 24 hours) at 05/12/2021 1402 Last  data filed at 05/12/2021 0100 Gross per 24 hour  Intake 340 ml  Output 1000 ml  Net -660 ml    Filed Weights   05/09/21 1642 05/09/21 1647  Weight: 81.6 kg 82 kg     Examination:  General exam: Appears calm and comfortable Respiratory system: Clear to auscultation. Respiratory effort normal. Cardiovascular system: S1 & S2 heard, RRR. No murmurs, rubs, gallops or clicks. Gastrointestinal system: Abdomen is nondistended, soft and nontender. No organomegaly or masses felt. Normal bowel sounds heard. Central nervous system: Alert and oriented. No focal neurological deficits. Musculoskeletal: No edema. No calf tenderness. Anterior leg tenderness on light palpation Skin: No cyanosis. No rashes Psychiatry: Judgement and insight appear normal. Mood & affect appropriate.      Data Reviewed: I have personally reviewed following labs and imaging studies  CBC Lab Results  Component Value Date   WBC 8.5 05/10/2021   RBC 3.92 05/10/2021   HGB 12.3 05/10/2021   HCT 37.0 05/10/2021   MCV 94.4 05/10/2021   MCH 31.4 05/10/2021   PLT 184 05/10/2021   MCHC 33.2 05/10/2021   RDW 13.6 05/10/2021   LYMPHSABS 2.0 05/09/2021   MONOABS 0.5 05/09/2021   EOSABS 0.2 05/09/2021   BASOSABS 0.0 49/67/5916     Last metabolic panel Lab Results  Component Value Date   NA 139 05/10/2021   K 3.7 05/10/2021   CL 106 05/10/2021   CO2 25 05/10/2021   BUN 20 05/10/2021   CREATININE 0.50 05/10/2021   GLUCOSE 100 (H) 05/10/2021   GFRNONAA >60 05/10/2021   GFRAA 84 10/16/2019   CALCIUM 9.7 05/10/2021   PHOS 3.0 11/12/2017   PROT 6.7 05/09/2021   ALBUMIN 3.1 (L) 05/09/2021   LABGLOB 2.6 10/16/2019   AGRATIO 1.5 10/16/2019   BILITOT 0.8 05/09/2021   ALKPHOS 51 05/09/2021   AST 32 05/09/2021   ALT 21 05/09/2021   ANIONGAP 8 05/10/2021    CBG (last 3)  No results for input(s): GLUCAP in the last 72 hours.   GFR: Estimated Creatinine Clearance: 56.1 mL/min (by C-G formula based on SCr of 0.5 mg/dL).  Coagulation Profile: Recent Labs  Lab 05/09/21 1750  INR 1.1     Recent Results (from the past 240 hour(s))  Resp Panel by RT-PCR (Flu A&B, Covid)  Nasopharyngeal Swab     Status: None   Collection Time: 05/09/21  5:50 PM   Specimen: Nasopharyngeal Swab; Nasopharyngeal(NP) swabs in vial transport medium  Result Value Ref Range Status   SARS Coronavirus 2 by RT PCR NEGATIVE NEGATIVE Final    Comment: (NOTE) SARS-CoV-2 target nucleic acids are NOT DETECTED.  The SARS-CoV-2 RNA is generally detectable in upper respiratory specimens during the acute phase of infection. The lowest concentration of SARS-CoV-2 viral copies this assay can detect is 138 copies/mL. A negative result does not preclude SARS-Cov-2 infection and should not be used as the sole basis for treatment or other patient management decisions. A negative result may occur with  improper specimen collection/handling, submission of specimen other than nasopharyngeal swab, presence of viral mutation(s) within the areas targeted by this assay, and inadequate number of viral copies(<138 copies/mL). A negative result must be combined with clinical observations, patient history, and epidemiological information. The expected result is Negative.  Fact Sheet for Patients:  EntrepreneurPulse.com.au  Fact Sheet for Healthcare Providers:  IncredibleEmployment.be  This test is no t yet approved or cleared by the [place] FDA and  has been authorized for detection and/or diagnosis of  SARS-CoV-2 by FDA under an Emergency Use Authorization (EUA). This EUA will remain  in effect (meaning this test can be used) for the duration of the COVID-19 declaration under Section 564(b)(1) of the Act, 21 U.S.C.section 360bbb-3(b)(1), unless the authorization is terminated  or revoked sooner.       Influenza A by PCR NEGATIVE NEGATIVE Final   Influenza B by PCR NEGATIVE NEGATIVE Final    Comment: (NOTE) The Xpert Xpress SARS-CoV-2/FLU/RSV plus assay is intended as an aid in the diagnosis of influenza from Nasopharyngeal swab specimens and should not be  used as a sole basis for treatment. Nasal washings and aspirates are unacceptable for Xpert Xpress SARS-CoV-2/FLU/RSV testing.  Fact Sheet for Patients: EntrepreneurPulse.com.au  Fact Sheet for Healthcare Providers: IncredibleEmployment.be  This test is not yet approved or cleared by the [place] FDA and has been authorized for detection and/or diagnosis of SARS-CoV-2 by FDA under an Emergency Use Authorization (EUA). This EUA will remain in effect (meaning this test can be used) for the duration of the COVID-19 declaration under Section 564(b)(1) of the Act, 21 U.S.C. section 360bbb-3(b)(1), unless the authorization is terminated or revoked.  Performed at [facility], [place] [address], [place], [place] [address]   Urine Culture     Status: Abnormal   Collection Time: 05/09/21 10:30 PM   Specimen: In/Out Cath Urine  Result Value Ref Range Status   Specimen Description   Final    IN/OUT CATH URINE Performed at [facility] [address] [place], [place] [address]    Special Requests   Final    NONE Performed at [facility], [place] [address], [place], [place] [address]    Culture 70,000 COLONIES/mL ESCHERICHIA COLI (A)  Final   Report Status 05/11/2021 FINAL  Final   Organism ID, Bacteria ESCHERICHIA COLI (A)  Final      Susceptibility   Escherichia coli - MIC*    AMPICILLIN >=32 RESISTANT Resistant     CEFAZOLIN >=64 RESISTANT Resistant     CEFEPIME <=0.12 SENSITIVE Sensitive     CEFTRIAXONE 0.5 SENSITIVE Sensitive     CIPROFLOXACIN <=0.25 SENSITIVE Sensitive     GENTAMICIN <=1 SENSITIVE Sensitive     IMIPENEM <=0.25 SENSITIVE Sensitive     NITROFURANTOIN <=16 SENSITIVE Sensitive     TRIMETH/SULFA <=20 SENSITIVE Sensitive     AMPICILLIN/SULBACTAM >=32 RESISTANT Resistant     PIP/TAZO 8 SENSITIVE Sensitive     * 70,000 COLONIES/mL ESCHERICHIA COLI  Culture, blood (routine  x 2)     Status: None (Preliminary result)   Collection Time: 05/11/21  2:07 PM   Specimen: BLOOD  Result Value Ref Range Status   Specimen Description   Final    BLOOD RIGHT HAND Performed at [facility] [address] [place], [place] [address]    Special Requests   Final    BOTTLES DRAWN AEROBIC AND ANAEROBIC Blood Culture adequate volume Performed at [facility] [address] [place], [place] [address]    Culture   Final    NO GROWTH < 24 HOURS Performed at [facility] [address] [place], [place] [address]    Report Status PENDING  Incomplete  Culture, blood (routine x 2)     Status: None (Preliminary result)   Collection Time: 05/11/21  2:07 PM   Specimen: BLOOD  Result Value Ref Range Status   Specimen Description   Final    BLOOD  BLOOD RIGHT FOREARM Performed at [facility] [address] [place], [place] [address]    Special Requests   Final    BOTTLES DRAWN AEROBIC ONLY Blood Culture adequate volume Performed at [facility] [address], [place], [place] [address]    Culture   Final    NO GROWTH < 24 HOURS Performed at [facility] [address], [place], [place] [address]    Report Status PENDING  Incomplete         Radiology Studies: MR THORACIC SPINE WO CONTRAST  Result Date: 05/10/2021 CLINICAL DATA:  Low back pain with possible cauda equina syndrome. EXAM: MRI THORACIC AND LUMBAR SPINE WITHOUT CONTRAST TECHNIQUE: Multiplanar and multiecho pulse sequences of the thoracic and lumbar spine were obtained without intravenous contrast. COMPARISON:  None. FINDINGS: MRI THORACIC SPINE FINDINGS Alignment: Physiologic. Vertebrae: Diffusely heterogeneous bone marrow signal. No compression fracture or other acute abnormality. Cord:  Normal Paraspinal and other soft tissues: Enlarged and heterogeneous right thyroid lobe, incompletely visualized. Disc levels: No  spinal canal or neural foraminal stenosis. MRI LUMBAR SPINE FINDINGS Segmentation:  Standard Alignment:  L2-3 and L3-4 grade 1 retrolisthesis. Vertebrae: There is hyperintense T2-weighted signal within the L2-3 disc with accompanying signal changes in both endplates. Conus medullaris and cauda equina: Conus extends to the L2 level. Conus and cauda equina appear normal. Paraspinal and other soft tissues: Paraspinous muscle atrophy. No prevertebral collection. Disc levels: L1-L2: Normal disc space and facet joints. No spinal canal stenosis. No neural foraminal stenosis. L2-L3: Intermediate sized disc bulge with endplate spurring and mild facet hypertrophy. Narrowing of both lateral recesses without central spinal canal stenosis. Mild bilateral neural foraminal stenosis. L3-L4: Large right asymmetric disc bulge with endplate spurring and moderate facet hypertrophy. No central spinal canal stenosis. Severe right neural foraminal stenosis. L4-L5: Small disc bulge with superimposed small central disc protrusion. No spinal canal stenosis. No neural foraminal stenosis. L5-S1: Mild central disc protrusion with mild facet hypertrophy. No spinal canal stenosis. No neural foraminal stenosis. Visualized sacrum: Normal. IMPRESSION: 1. L2-L3 disc and endplate signal changes, nonspecific. This may be degenerative, however, discitis-osteomyelitis may also cause this appearance. 2. Severe right L3-4 neural foraminal stenosis secondary to large right asymmetric disc bulge and endplate spurring. 3. Narrowing of both lateral recesses and mild bilateral neural foraminal stenosis at L2-L3. These findings could contribute to radicular symptoms in the L2 or L3 distributions. 4. No thoracic spinal canal or neural foraminal stenosis. 5. Diffusely heterogeneous bone marrow signal, nonspecific. This can be seen in the setting of chronic anemia or smoking history. An infiltrative process such as metastatic disease is also a consideration. 6.  Incidental heterogeneous and enlarged thyroid. Recommend thyroid US. Reference: J Am Coll Radiol. 2015 Feb;12(2): 143-50 Electronically Signed   By: Ulyses Jarred M.D.   On: 05/10/2021 19:39   MR LUMBAR SPINE WO CONTRAST  Result Date: 05/10/2021 CLINICAL DATA:  Low back pain with possible cauda equina syndrome. EXAM: MRI THORACIC AND LUMBAR SPINE WITHOUT CONTRAST TECHNIQUE: Multiplanar and multiecho pulse sequences of the thoracic and lumbar spine were obtained without intravenous contrast. COMPARISON:  None. FINDINGS: MRI THORACIC SPINE FINDINGS Alignment: Physiologic. Vertebrae: Diffusely heterogeneous bone marrow signal. No compression fracture or other acute abnormality. Cord:  Normal Paraspinal and other soft tissues: Enlarged and heterogeneous right thyroid lobe, incompletely visualized. Disc levels: No spinal canal or neural foraminal stenosis. MRI LUMBAR SPINE FINDINGS Segmentation:  Standard Alignment:  L2-3 and L3-4 grade 1 retrolisthesis. Vertebrae:  There is hyperintense T2-weighted signal within the L2-3 disc with accompanying signal changes in both endplates. Conus medullaris and cauda equina: Conus extends to the L2 level. Conus and cauda equina appear normal. Paraspinal and other soft tissues: Paraspinous muscle atrophy. No prevertebral collection. Disc levels: L1-L2: Normal disc space and facet joints. No spinal canal stenosis. No neural foraminal stenosis. L2-L3: Intermediate sized disc bulge with endplate spurring and mild facet hypertrophy. Narrowing of both lateral recesses without central spinal canal stenosis. Mild bilateral neural foraminal stenosis. L3-L4: Large right asymmetric disc bulge with endplate spurring and moderate facet hypertrophy. No central spinal canal stenosis. Severe right neural foraminal stenosis. L4-L5: Small disc bulge with superimposed small central disc protrusion. No spinal canal stenosis. No neural foraminal stenosis. L5-S1: Mild central disc protrusion with mild  facet hypertrophy. No spinal canal stenosis. No neural foraminal stenosis. Visualized sacrum: Normal. IMPRESSION: 1. L2-L3 disc and endplate signal changes, nonspecific. This may be degenerative, however, discitis-osteomyelitis may also cause this appearance. 2. Severe right L3-4 neural foraminal stenosis secondary to large right asymmetric disc bulge and endplate spurring. 3. Narrowing of both lateral recesses and mild bilateral neural foraminal stenosis at L2-L3. These findings could contribute to radicular symptoms in the L2 or L3 distributions. 4. No thoracic spinal canal or neural foraminal stenosis. 5. Diffusely heterogeneous bone marrow signal, nonspecific. This can be seen in the setting of chronic anemia or smoking history. An infiltrative process such as metastatic disease is also a consideration. 6. Incidental heterogeneous and enlarged thyroid. Recommend thyroid US. Reference: J Am Coll Radiol. 2015 Feb;12(2): 143-50 Electronically Signed   By: Ulyses Jarred M.D.   On: 05/10/2021 19:39   DG Abd 2 Views  Result Date: 05/11/2021 CLINICAL DATA:  Checking for foreign body and alimentary tract EXAM: ABDOMEN - 2 VIEW COMPARISON:  CT 06/27/2017 FINDINGS: Nonobstructive bowel gas pattern. No organomegaly or free air. Surgical clips noted in the region of the proximal stomach. Calcified phleboliths in the pelvis. No unexpected radiopaque foreign body. IMPRESSION: No evidence of unexpected radiopaque foreign body. No bowel obstruction or free air. Electronically Signed   By: Rolm Baptise M.D.   On: 05/11/2021 13:20   US THYROID  Result Date: 05/12/2021 CLINICAL DATA:  Abnormal appearance of the thyroid on MRI EXAM: THYROID ULTRASOUND TECHNIQUE: Ultrasound examination of the thyroid gland and adjacent soft tissues was performed. COMPARISON:  None. FINDINGS: Parenchymal Echotexture: Moderately heterogeneous Isthmus: 0.2 cm Right lobe: 4.9 x 2.9 x 2.4 cm Left lobe: 4.8 x 2.1 x 1.6 cm  _________________________________________________________ Estimated total number of nodules >/= 1 cm: 0 Number of spongiform nodules >/=  2 cm not described below (TR1): 0 Number of mixed cystic and solid nodules >/= 1.5 cm not described below (TR2): 0 _________________________________________________________ 0.9 cm mixed solid cystic nodule in the left thyroid lobe does not meet criteria for imaging follow-up or surveillance. IMPRESSION: Diffuse heterogeneity of the thyroid parenchyma without suspicious nodule. The above is in keeping with the ACR TI-RADS recommendations - J Am Coll Radiol 2017;14:587-595. Electronically Signed   By: Miachel Roux M.D.   On: 05/12/2021 07:56        Scheduled Meds:  amLODipine  2.5 mg Oral Daily   atorvastatin  10 mg Oral QHS   cloNIDine  0.1 mg Oral BID   docusate sodium  100 mg Oral BID   enoxaparin (LOVENOX) injection  40 mg Subcutaneous Daily   furosemide  20 mg Oral Daily   latanoprost  1 drop Both Eyes  QHS   metoprolol tartrate  50 mg Oral BID   polyethylene glycol  17 g Oral BID   senna  2 tablet Oral Daily   Continuous Infusions:  cefTRIAXone (ROCEPHIN)  IV       LOS: 3 days     Cordelia Poche, MD [facility] 05/12/2021, 2:02 PM  If 7PM-7AM, please contact night-coverage [email or web address]

## 2021-05-12 NOTE — Consult Note (Signed)
       [facility]    Date of Admission:  05/09/2021     Reason for Consult: abnormal lumbar mri    Referring Provider: Nettey     Abx: none        Assessment: 84 yo female hx dvt no longer on anticoagulation, htn/hlp, osteoarthritis admitted 9/26 for 1 week progressive lower back pain found ot have imaging suggestion discitis at L2-3 area   CrpHS 1.6 Sed rate 26  No sign of sepsis  Also has stage 3 gluteal cleft decub on admission which wound care evaluated. No sign of soft tissue infection there, but potentially could explain mild crp elevation. Esr is normal for age  At this time a reasonable approach as I am not convinced her L2-3 mri imaging is of clinical significance, would be watch and wait (trending esr). But if NSG feels strongly the need to biopsy, agree IR sampling is reasonable  At this time would hold on any empiric abx  On another note. She has had total iincontinence and followed by urologically.  She has no acute symptomatology that suggest uti. Her urine is without pyuria, only with bacteria which signifies assymptomatic bacteriuria   Plan: Can defer IR aspirate at this time. No abx indicated Continue supportive care/physical therapy Can follow up with me in clinic for ongoing evaluation of sign/sx suggestive of vertebral OM   Discussed with primary team Id will sign off   Appointment: 10/26 @ 11am  @  [facility] [address], [place], [place] [address] Phone: [phone]   I spent 60 minute reviewing data/chart, and coordinating care and >50% direct face to face time providing counseling/discussing diagnostics/treatment plan with patient    ------------------------------------------------ Principal Problem:   Acute low back pain Active Problems:   Deafness   Essential hypertension   Long term current use of anticoagulant therapy   Weakness of both legs   Lumbago   DDD (degenerative disc  disease), lumbar   Constipation   Pressure injury of skin    HPI: Melissa Murphy is a 85 y.o. 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 This gotten to moderate, but the reason she is concerned because of LE weakness. She has been limited in ambulation due to pain  She denies fever, chill, malaise, focal numbness/tingling, recent medical procedure/illness She deneis change in her urinary incontinence She feels well just want to be able to walk well again  She has been previously up to date with her age appropriate cancer screening  She takes no chronic abx   On arrival no sign of sepsis Thoracic/lumbar mri is suggestive of potential L2-3 discitis. Nsg evaluated but is not entirely certain Esr only mildly elevated but is normal for age adjustment Bcx ngtd Ucx ecoli, but UA without pyuria/hematuria and again no acute uti sx  She appears to have stage 3 sacral ulcer on arrival  Family History  Problem Relation Age of Onset   Cancer Mother 76       colon cancer   Stroke Mother 77       cause of death   Heart disease Mother    Hypertension Mother    Heart disease Father 83       AMI; cause of death   Heart disease Brother          AMI x 2; tobacco abuse; CABG   Hernia Brother    Hyperlipidemia Brother    Hypertension Brother    Migraines Daughter    Heart disease Paternal Grandmother    Esophageal cancer Neg Hx    Rectal cancer Neg Hx    Stomach cancer Neg Hx     Social History   Tobacco Use   Smoking status: Never   Smokeless tobacco: Never  Vaping Use   Vaping Use: Never used  Substance Use Topics   Alcohol use: Yes    Alcohol/week: 0.0 standard drinks    Comment: occasionally   Drug use: No    Allergies  Allergen Reactions   Avastin [Bevacizumab] Nausea And Vomiting   Clindamycin/Lincomycin  Itching   Novocain [Procaine] Other (See Comments)    Seizures    Sulfa Antibiotics Other (See Comments)    Causes fevers    Review of Systems: ROS All Other ROS was negative, except mentioned above   Past Medical History:  Diagnosis Date   Anemia    Arthritis    Cataract    Clotting disorder (HCC)    Deaf    Congenital; Requires an interpreter   DVT (deep venous thrombosis) (HCC)    in eye, and leg   Edema    GERD (gastroesophageal reflux disease)    Glaucoma    Hernia    Hiatal hernia    Hyperlipidemia    Hypertension    Osteoporosis    Sleep apnea    Thyroid disease    Ulcer    Urinary incontinence    Uterine polyp        Scheduled Meds:  amLODipine  2.5 mg Oral Daily   atorvastatin  10 mg Oral QHS   cloNIDine  0.1 mg Oral BID   docusate sodium  100 mg Oral BID   enoxaparin (LOVENOX) injection  40 mg Subcutaneous Daily   furosemide  20 mg Oral Daily   latanoprost  1 drop Both Eyes QHS   metoprolol tartrate  50 mg Oral BID   polyethylene glycol  17 g Oral BID   senna  2 tablet Oral Daily   Continuous Infusions: PRN Meds:.acetaminophen **OR** acetaminophen, bisacodyl, hydrALAZINE   OBJECTIVE: Blood pressure (!) 179/72, pulse (!) 55, temperature 98 F (36.7 C), resp. rate 18, height 5' 4" (1.626 m), weight 82 kg, SpO2 99 %.  Physical Exam  General/constitutional: no distress, pleasant; deaf/mute; sign language itnerpreter by bedside HEENT: Normocephalic, PER, Conj Clear, EOMI, Oropharynx clear Neck supple CV: rrr no mrg Lungs: clear to auscultation, normal respiratory effort Abd: Soft, Nontender Ext: no edema Skin: No Rash; hyperpigmentation bilateral LE Neuro: nonfocal MSK: no peripheral joint swelling/tenderness/warmth; back spines nontender Psych alert/oriented  Lab Results Lab Results  Component Value Date   WBC 8.5 05/10/2021   HGB 12.3 05/10/2021   HCT 37.0 05/10/2021   MCV 94.4 05/10/2021   PLT 184 05/10/2021    Lab Results   Component Value Date   CREATININE 0.50 05/10/2021   BUN 20 05/10/2021   NA 139 05/10/2021   K 3.7 05/10/2021   CL 106 05/10/2021   CO2 25 05/10/2021    Lab Results  Component Value Date   ALT 21 05/09/2021   AST 32 05/09/2021   ALKPHOS 51 05/09/2021   BILITOT 0.8 05/09/2021      Microbiology: Recent Results (from the past 240 hour(s))  Resp Panel by RT-PCR (Flu A&B, Covid) Nasopharyngeal Swab     Status: None     Collection Time: 05/09/21  5:50 PM   Specimen: Nasopharyngeal Swab; Nasopharyngeal(NP) swabs in vial transport medium  Result Value Ref Range Status   SARS Coronavirus 2 by RT PCR NEGATIVE NEGATIVE Final    Comment: (NOTE) SARS-CoV-2 target nucleic acids are NOT DETECTED.  The SARS-CoV-2 RNA is generally detectable in upper respiratory specimens during the acute phase of infection. The lowest concentration of SARS-CoV-2 viral copies this assay can detect is 138 copies/mL. A negative result does not preclude SARS-Cov-2 infection and should not be used as the sole basis for treatment or other patient management decisions. A negative result may occur with  improper specimen collection/handling, submission of specimen other than nasopharyngeal swab, presence of viral mutation(s) within the areas targeted by this assay, and inadequate number of viral copies(<138 copies/mL). A negative result must be combined with clinical observations, patient history, and epidemiological information. The expected result is Negative.  Fact Sheet for Patients:  EntrepreneurPulse.com.au  Fact Sheet for Healthcare Providers:  IncredibleEmployment.be  This test is no t yet approved or cleared by the [place] FDA and  has been authorized for detection and/or diagnosis of SARS-CoV-2 by FDA under an Emergency Use Authorization (EUA). This EUA will remain  in effect (meaning this test can be used) for the duration of the COVID-19 declaration under  Section 564(b)(1) of the Act, 21 U.S.C.section 360bbb-3(b)(1), unless the authorization is terminated  or revoked sooner.       Influenza A by PCR NEGATIVE NEGATIVE Final   Influenza B by PCR NEGATIVE NEGATIVE Final    Comment: (NOTE) The Xpert Xpress SARS-CoV-2/FLU/RSV plus assay is intended as an aid in the diagnosis of influenza from Nasopharyngeal swab specimens and should not be used as a sole basis for treatment. Nasal washings and aspirates are unacceptable for Xpert Xpress SARS-CoV-2/FLU/RSV testing.  Fact Sheet for Patients: EntrepreneurPulse.com.au  Fact Sheet for Healthcare Providers: IncredibleEmployment.be  This test is not yet approved or cleared by the [place] FDA and has been authorized for detection and/or diagnosis of SARS-CoV-2 by FDA under an Emergency Use Authorization (EUA). This EUA will remain in effect (meaning this test can be used) for the duration of the COVID-19 declaration under Section 564(b)(1) of the Act, 21 U.S.C. section 360bbb-3(b)(1), unless the authorization is terminated or revoked.  Performed at [facility], [place] [address] [place], [place] [address]   Urine Culture     Status: Abnormal   Collection Time: 05/09/21 10:30 PM   Specimen: In/Out Cath Urine  Result Value Ref Range Status   Specimen Description   Final    IN/OUT CATH URINE Performed at [facility] [address], [place], [place] [address]    Special Requests   Final    NONE Performed at [facility], [place] [address] [place], [place] [address]    Culture 70,000 COLONIES/mL ESCHERICHIA COLI (A)  Final   Report Status 05/11/2021 FINAL  Final   Organism ID, Bacteria ESCHERICHIA COLI (A)  Final      Susceptibility   Escherichia coli - MIC*    AMPICILLIN >=32 RESISTANT Resistant     CEFAZOLIN >=64 RESISTANT Resistant     CEFEPIME <=0.12 SENSITIVE Sensitive      CEFTRIAXONE 0.5 SENSITIVE Sensitive     CIPROFLOXACIN <=0.25 SENSITIVE Sensitive     GENTAMICIN <=1 SENSITIVE Sensitive     IMIPENEM <=0.25 SENSITIVE Sensitive     NITROFURANTOIN <=16 SENSITIVE Sensitive     TRIMETH/SULFA <=20 SENSITIVE Sensitive  AMPICILLIN/SULBACTAM >=32 RESISTANT Resistant     PIP/TAZO 8 SENSITIVE Sensitive     * 70,000 COLONIES/mL ESCHERICHIA COLI     Serology:    Imaging: If present, new imagings (plain films, ct scans, and mri) have been personally visualized and interpreted; radiology reports have been reviewed. Decision making incorporated into the Impression / Recommendations.  9/27 mri lumbar/thoracic spine 1. L2-L3 disc and endplate signal changes, nonspecific. This may be degenerative, however, discitis-osteomyelitis may also cause this appearance. 2. Severe right L3-4 neural foraminal stenosis secondary to large right asymmetric disc bulge and endplate spurring. 3. Narrowing of both lateral recesses and mild bilateral neural foraminal stenosis at L2-L3. These findings could contribute to radicular symptoms in the L2 or L3 distributions. 4. No thoracic spinal canal or neural foraminal stenosis. 5. Diffusely heterogeneous bone marrow signal, nonspecific. This can be seen in the setting of chronic anemia or smoking history. An infiltrative process such as metastatic disease is also a consideration. 6. Incidental heterogeneous and enlarged thyroid. Recommend thyroid US.     Trung T Vu, MD [facility] [phone] pager    05/12/2021, 9:28 AM  

## 2021-05-12 NOTE — Plan of Care (Signed)

## 2021-05-13 LAB — SARS CORONAVIRUS 2 (TAT 6-24 HRS): SARS Coronavirus 2: NEGATIVE

## 2021-05-13 MED ORDER — COVID-19MRNA BIVAL VACC PFIZER 30 MCG/0.3ML IM SUSP
0.3000 mL | Freq: Once | INTRAMUSCULAR | Status: AC
  Administered 2021-05-13: 0.3 mL via INTRAMUSCULAR
  Filled 2021-05-13: qty 0.3

## 2021-05-13 MED ORDER — AMLODIPINE BESYLATE 5 MG PO TABS: 5.0000 mg | ORAL_TABLET | Freq: Every day | ORAL | Status: DC

## 2021-05-13 MED ORDER — METOPROLOL TARTRATE 25 MG PO TABS: 25.0000 mg | ORAL_TABLET | Freq: Two times a day (BID) | ORAL | Status: AC

## 2021-05-13 MED ORDER — METOPROLOL TARTRATE 25 MG PO TABS
25.0000 mg | ORAL_TABLET | Freq: Two times a day (BID) | ORAL | Status: DC
  Administered 2021-05-13: 25 mg via ORAL
  Filled 2021-05-13: qty 1

## 2021-05-13 MED ORDER — POLYETHYLENE GLYCOL 3350 17 G PO PACK: 17.0000 g | PACK | Freq: Every day | ORAL | Status: DC | PRN

## 2021-05-13 NOTE — Progress Notes (Signed)
Sent Dr. Lonny Prude a secure chat and let MD know that patient's heart rate went down and maintained in the upper 40's about an hour after taking morning beta blocker. Patient's heart rate now back up to upper 50's, low 60's. MD acknowledged and stated that he adjusted the dose of beta blocker.

## 2021-05-13 NOTE — Care Management Important Message (Signed)
Medicare IM printed for Social Work at [facility] to give to the patient 

## 2021-05-13 NOTE — Progress Notes (Signed)
Report called to Malachi Bonds, RN at [facility]. Patient will be going to room 1201P.

## 2021-05-13 NOTE — Discharge Instructions (Addendum)
Melissa Murphy,  You were in the hospital because of back pain. This may be related to some degenerative disc disease. The neurosurgeon does not recommend any intervention while in the hospital and you can follow-up as needed with him. There was also concern for possible infection, but this looks unlikely; the infectious disease doctor wants to see you in his office. With regard to your thyroid, there was some abnormality, but nothing definitive. Please follow-up with your primary care physician for continued workup.

## 2021-05-13 NOTE — Discharge Summary (Addendum)
Physician Discharge Summary  Teresina Bugaj NFA:[id number] DOB: 03-Oct-1935 DOA: 05/09/2021  PCP: Yvonna Alanis, NP  Admit date: 05/09/2021 Discharge date: 05/13/2021  Admitted From: Home Disposition: SNF  Recommendations for Outpatient Follow-up:  Follow up with PCP in 1 week Follow up on thyroid workup If continued lower extremity weakness, recommend neurosurgery follow-up Please follow up on the following pending results: None   Discharge Condition: Stable CODE STATUS: Full code Diet recommendation: Regular diet   Brief/Interim Summary:  Admission HPI written by Eben Burow, MD   HPI: Sonita Michiels is a 85 y.o. female with medical history significant for HTN, hx of DVT on xarelto, HLD, OA  Seen with aide of Sign Language Interpretor by tablet in ER.  She reports that last week she was watching the funeral of Mallory Shirk on TV when she tried to stand up from the couch and was not able to.  She felt a sudden onset of low back pain which was worsened with movement walking and standing up.  Over the next few days she has had increasing weakness in her legs and has not been able to ambulate and she fell inside to come to the emergency room for evaluation.  She reports she is able to urinate and has no dysuria.  She reports having constipation which she has had in the past but seemed a little worse over the last few days.  Denies any injury or trauma to her back.  She denies any fever or chills nausea or vomiting.  She states she has not had any chest pain, chest pressure or palpitations and has not had any shortness of breath.  She reports she is never had symptoms like this in the past.   Hospital course:  Acute lumbar back pain Unsure of etiology. CT and MRI with evidence of degenerative disc disease/slipped disc but no definitive acute etiology. Pain has improved while admitted. PT/OT recommendations include SNF on discharge. Patient has been managed successfully  with Tylenol as needed.    Ambulatory dysfunction Possibly secondary to pain related to back. Discussed with Neurosurgery, Dr. Ronnald Ramp who reviewed her imaging and does not think findings are related to patient's symptoms. Patient can follow-up as needed with neurosurgery if weakness fails to improve with therapy.   Abnormal MRI findings Concern for possible discitis. Patient afebrile, no leukocytosis, not high risk for bacteremia. Discussed with neurosurgery who thinks osteomyelitis cannot be ruled out with MRI findings. Sed rate and CRP mildly elevated. ID consulted and recommendations for observation at this time; patient to follow-up as an outpatient. No empiric antibiotics recommended. Outpatient follow-up arranged.   Abnormal thyroid gland Noted on MRI.  Recommendation for ultrasound of thyroid. Thyroid US significant for diffuse heterogeneity of thy thyroid parenchyma; no suspicious nodules. TSH normal. Recommend outpatient workup.   Possible UTI Patient with symptoms of frequency/urgency. Urine culture significant for E. Coli. Given one dose of Ceftriaxone, however ID recommending not to treat.   Demand ischemia Troponin of 117 > 106 > 65. No associated chest pain. Transthoracic Echocardiogram without WMA or LV dysfunction. EKG not consistent with ACS.   Left hand hematoma/bruising Likely related to IV infiltration. Improving.   Primary hypertension Continue Clonidine; increase to amlodipine 5 mg daily. Decrease metoprolol to 25 mg BID for slight bradycardia while sleeping.   Hyperlipidemia Continue Lipitor   History of DVT Noted. Not on anticoagulation   Deafness Communication via ASL interpreter as able   Constipation Continue Miralax, Colace, Senna  Pressure injury Initial concern. Per WOC, lesions not consistent with pressure injury.   Discharge Diagnoses:  Principal Problem:   Acute low back pain Active Problems:   Deafness   Essential hypertension   Long term  current use of anticoagulant therapy   Weakness of both legs   Lumbago   DDD (degenerative disc disease), lumbar   Constipation   Pressure injury of skin   Abnormal finding on imaging    Discharge Instructions  Discharge Instructions     No wound care   Complete by: As directed       Allergies as of 05/13/2021       Reactions   Avastin [bevacizumab] Nausea And Vomiting   Clindamycin/lincomycin Itching   Novocain [procaine] Other (See Comments)   Seizures   Sulfa Antibiotics Other (See Comments)   Causes fevers        Medication List     STOP taking these medications    Xarelto 20 MG Tabs tablet Generic drug: rivaroxaban       TAKE these medications    acetaminophen 500 MG tablet Commonly known as: TYLENOL Take 500 mg by mouth every 6 (six) hours as needed for mild pain or headache.   amLODipine 5 MG tablet Commonly known as: NORVASC Take 1 tablet (5 mg total) by mouth daily. Start taking on: May 14, 2021 What changed:  medication strength how much to take   cloNIDine 0.1 MG tablet Commonly known as: CATAPRES Take 1 tablet (0.1 mg total) by mouth 2 (two) times daily.   diphenhydramine-acetaminophen 25-500 MG Tabs tablet Commonly known as: TYLENOL PM Take 1 tablet by mouth at bedtime as needed (sleep).   furosemide 20 MG tablet Commonly known as: LASIX Take 1 tablet (20 mg total) by mouth daily.   latanoprost 0.005 % ophthalmic solution Commonly known as: XALATAN Place 1 drop into both eyes at bedtime.   metoprolol tartrate 25 MG tablet Commonly known as: LOPRESSOR Take 1 tablet (25 mg total) by mouth 2 (two) times daily. What changed:  medication strength how much to take   polyethylene glycol 17 g packet Commonly known as: MIRALAX / GLYCOLAX Take 17 g by mouth daily as needed.   simvastatin 20 MG tablet Commonly known as: ZOCOR TAKE 1 TABLET (20 MG TOTAL) BY MOUTH AT BEDTIME. What changed:  how much to take how to take  this when to take this additional instructions        Follow-up Information     Fargo, Amy E, NP. Schedule an appointment as soon as possible for a visit in 1 week(s).   Specialty: Adult Health Nurse Practitioner Why: For hospital follow-up Contact information: [address] [place] [address] [phone]         Eustace Moore, MD. Schedule an appointment as soon as possible for a visit.   Specialty: Neurosurgery Why: As needed for continued lower extremity weakness Contact information: [address] [place] [address] [place] [address] [phone]         Jabier Mutton, MD. Go on 06/08/2021.   Specialty: Infectious Diseases Why: 11am Contact information: [address] [place] [address] [phone]                Allergies  Allergen Reactions   Avastin [Bevacizumab] Nausea And Vomiting   Clindamycin/Lincomycin Itching   Novocain [Procaine] Other (See Comments)    Seizures    Sulfa Antibiotics Other (See Comments)    Causes  fevers    Consultations: Neurosurgery (curbside) Infectious disease   Procedures/Studies: CT Thoracic Spine Wo Contrast  Result Date: 05/09/2021 CLINICAL DATA:  Low back pain, can not walk and can not feel her legs, generalized weakness since the nineteenth EXAM: CT THORACIC AND LUMBAR SPINE WITHOUT CONTRAST TECHNIQUE: Multidetector CT imaging of the thoracic and lumbar spine was performed without contrast. Multiplanar CT image reconstructions were also generated. COMPARISON:  CT abdomen/pelvis 06/27/2017 FINDINGS: CT THORACIC SPINE FINDINGS Alignment: There is dextroscoliosis of the thoracic spine centered at T11. There is no antero or retrolisthesis. Vertebrae: The bones are diffusely demineralized. Vertebral body heights are preserved. There is no evidence of acute fracture. There is no suspicious osseous lesion. Paraspinal and other soft tissues: The paraspinal soft tissues are unremarkable. There is  dependent subsegmental atelectasis in the lung bases. There is a small hiatal hernia. Disc levels: There is mild multilevel degenerative change throughout the thoracic spine. The osseous spinal canal and neural foramina are patent. CT LUMBAR SPINE FINDINGS Segmentation: Standard; the lowest disc space is designated L5-S1. Alignment: There is grade 1 retrolisthesis of L2 on L3 and L3 on L4 measuring 5 mm at both levels, unchanged compared to the CT abdomen/pelvis of 06/27/2017. Alignment is otherwise normal. Vertebrae: The bones are diffusely demineralized. Vertebral body heights are preserved. There is no evidence of acute fracture. There is no suspicious osseous lesion. Paraspinal and other soft tissues: Small radiodense foci in the subcutaneous fat over the right back are unchanged since 2018. The paraspinal soft tissues are otherwise unremarkable. There is calcified atherosclerotic plaque of the abdominal aorta. A curvilinear radiodense object in the large bowel is of uncertain etiology but may reflect ingested material. There is no associated inflammatory change or evidence of obstruction. Disc levels: There is mild multilevel intervertebral disc space narrowing. There is multilevel facet arthropathy, most advanced at L5-S1 on the left. There is advanced degenerative endplate change with endplate osteophytes and endplate irregularity at L1-L2 and L2-L3, progressed since 2018. There is probably at least moderate spinal canal stenosis at L2-L3 due to the retrolisthesis of L2 on L3. The osseous spinal canal is otherwise patent. There is severe right neural foraminal stenosis at L3-L4. The other neural foramina appear grossly patent. IMPRESSION: CT THORACIC SPINE IMPRESSION 1. No acute fracture or traumatic malalignment of the thoracic spine. 2. Dextroscoliosis centered at T11. CT LUMBAR SPINE IMPRESSION 1. No acute fracture or traumatic malalignment of the lumbar spine. 2. Grade 1 retrolisthesis of L2 on L3 is  similar to the prior CT abdomen/pelvis from 06/27/2017. There is likely at least moderate spinal canal stenosis at L2-L3 due to the retrolisthesis. MRI may be considered to better evaluate for cauda equina nerve root impingement. 3. Severe right neural foraminal stenosis at L3-L4. 4. A curvilinear radiodense object in the large bowel is of uncertain etiology but may reflect ingested material. There is no associated inflammatory change or evidence of obstruction. Electronically Signed   By: Valetta Mole M.D.   On: 05/09/2021 17:58   CT Lumbar Spine Wo Contrast  Result Date: 05/09/2021 CLINICAL DATA:  Low back pain, can not walk and can not feel her legs, generalized weakness since the nineteenth EXAM: CT THORACIC AND LUMBAR SPINE WITHOUT CONTRAST TECHNIQUE: Multidetector CT imaging of the thoracic and lumbar spine was performed without contrast. Multiplanar CT image reconstructions were also generated. COMPARISON:  CT abdomen/pelvis 06/27/2017 FINDINGS: CT THORACIC SPINE FINDINGS Alignment: There is dextroscoliosis of the thoracic spine centered at T11. There is no  antero or retrolisthesis. Vertebrae: The bones are diffusely demineralized. Vertebral body heights are preserved. There is no evidence of acute fracture. There is no suspicious osseous lesion. Paraspinal and other soft tissues: The paraspinal soft tissues are unremarkable. There is dependent subsegmental atelectasis in the lung bases. There is a small hiatal hernia. Disc levels: There is mild multilevel degenerative change throughout the thoracic spine. The osseous spinal canal and neural foramina are patent. CT LUMBAR SPINE FINDINGS Segmentation: Standard; the lowest disc space is designated L5-S1. Alignment: There is grade 1 retrolisthesis of L2 on L3 and L3 on L4 measuring 5 mm at both levels, unchanged compared to the CT abdomen/pelvis of 06/27/2017. Alignment is otherwise normal. Vertebrae: The bones are diffusely demineralized. Vertebral body  heights are preserved. There is no evidence of acute fracture. There is no suspicious osseous lesion. Paraspinal and other soft tissues: Small radiodense foci in the subcutaneous fat over the right back are unchanged since 2018. The paraspinal soft tissues are otherwise unremarkable. There is calcified atherosclerotic plaque of the abdominal aorta. A curvilinear radiodense object in the large bowel is of uncertain etiology but may reflect ingested material. There is no associated inflammatory change or evidence of obstruction. Disc levels: There is mild multilevel intervertebral disc space narrowing. There is multilevel facet arthropathy, most advanced at L5-S1 on the left. There is advanced degenerative endplate change with endplate osteophytes and endplate irregularity at L1-L2 and L2-L3, progressed since 2018. There is probably at least moderate spinal canal stenosis at L2-L3 due to the retrolisthesis of L2 on L3. The osseous spinal canal is otherwise patent. There is severe right neural foraminal stenosis at L3-L4. The other neural foramina appear grossly patent. IMPRESSION: CT THORACIC SPINE IMPRESSION 1. No acute fracture or traumatic malalignment of the thoracic spine. 2. Dextroscoliosis centered at T11. CT LUMBAR SPINE IMPRESSION 1. No acute fracture or traumatic malalignment of the lumbar spine. 2. Grade 1 retrolisthesis of L2 on L3 is similar to the prior CT abdomen/pelvis from 06/27/2017. There is likely at least moderate spinal canal stenosis at L2-L3 due to the retrolisthesis. MRI may be considered to better evaluate for cauda equina nerve root impingement. 3. Severe right neural foraminal stenosis at L3-L4. 4. A curvilinear radiodense object in the large bowel is of uncertain etiology but may reflect ingested material. There is no associated inflammatory change or evidence of obstruction. Electronically Signed   By: Valetta Mole M.D.   On: 05/09/2021 17:58   MR THORACIC SPINE WO CONTRAST  Result  Date: 05/10/2021 CLINICAL DATA:  Low back pain with possible cauda equina syndrome. EXAM: MRI THORACIC AND LUMBAR SPINE WITHOUT CONTRAST TECHNIQUE: Multiplanar and multiecho pulse sequences of the thoracic and lumbar spine were obtained without intravenous contrast. COMPARISON:  None. FINDINGS: MRI THORACIC SPINE FINDINGS Alignment: Physiologic. Vertebrae: Diffusely heterogeneous bone marrow signal. No compression fracture or other acute abnormality. Cord:  Normal Paraspinal and other soft tissues: Enlarged and heterogeneous right thyroid lobe, incompletely visualized. Disc levels: No spinal canal or neural foraminal stenosis. MRI LUMBAR SPINE FINDINGS Segmentation:  Standard Alignment:  L2-3 and L3-4 grade 1 retrolisthesis. Vertebrae: There is hyperintense T2-weighted signal within the L2-3 disc with accompanying signal changes in both endplates. Conus medullaris and cauda equina: Conus extends to the L2 level. Conus and cauda equina appear normal. Paraspinal and other soft tissues: Paraspinous muscle atrophy. No prevertebral collection. Disc levels: L1-L2: Normal disc space and facet joints. No spinal canal stenosis. No neural foraminal stenosis. L2-L3: Intermediate sized disc bulge with  endplate spurring and mild facet hypertrophy. Narrowing of both lateral recesses without central spinal canal stenosis. Mild bilateral neural foraminal stenosis. L3-L4: Large right asymmetric disc bulge with endplate spurring and moderate facet hypertrophy. No central spinal canal stenosis. Severe right neural foraminal stenosis. L4-L5: Small disc bulge with superimposed small central disc protrusion. No spinal canal stenosis. No neural foraminal stenosis. L5-S1: Mild central disc protrusion with mild facet hypertrophy. No spinal canal stenosis. No neural foraminal stenosis. Visualized sacrum: Normal. IMPRESSION: 1. L2-L3 disc and endplate signal changes, nonspecific. This may be degenerative, however, discitis-osteomyelitis may  also cause this appearance. 2. Severe right L3-4 neural foraminal stenosis secondary to large right asymmetric disc bulge and endplate spurring. 3. Narrowing of both lateral recesses and mild bilateral neural foraminal stenosis at L2-L3. These findings could contribute to radicular symptoms in the L2 or L3 distributions. 4. No thoracic spinal canal or neural foraminal stenosis. 5. Diffusely heterogeneous bone marrow signal, nonspecific. This can be seen in the setting of chronic anemia or smoking history. An infiltrative process such as metastatic disease is also a consideration. 6. Incidental heterogeneous and enlarged thyroid. Recommend thyroid US. Reference: J Am Coll Radiol. 2015 Feb;12(2): 143-50 Electronically Signed   By: Ulyses Jarred M.D.   On: 05/10/2021 19:39   MR LUMBAR SPINE WO CONTRAST  Result Date: 05/10/2021 CLINICAL DATA:  Low back pain with possible cauda equina syndrome. EXAM: MRI THORACIC AND LUMBAR SPINE WITHOUT CONTRAST TECHNIQUE: Multiplanar and multiecho pulse sequences of the thoracic and lumbar spine were obtained without intravenous contrast. COMPARISON:  None. FINDINGS: MRI THORACIC SPINE FINDINGS Alignment: Physiologic. Vertebrae: Diffusely heterogeneous bone marrow signal. No compression fracture or other acute abnormality. Cord:  Normal Paraspinal and other soft tissues: Enlarged and heterogeneous right thyroid lobe, incompletely visualized. Disc levels: No spinal canal or neural foraminal stenosis. MRI LUMBAR SPINE FINDINGS Segmentation:  Standard Alignment:  L2-3 and L3-4 grade 1 retrolisthesis. Vertebrae: There is hyperintense T2-weighted signal within the L2-3 disc with accompanying signal changes in both endplates. Conus medullaris and cauda equina: Conus extends to the L2 level. Conus and cauda equina appear normal. Paraspinal and other soft tissues: Paraspinous muscle atrophy. No prevertebral collection. Disc levels: L1-L2: Normal disc space and facet joints. No spinal canal  stenosis. No neural foraminal stenosis. L2-L3: Intermediate sized disc bulge with endplate spurring and mild facet hypertrophy. Narrowing of both lateral recesses without central spinal canal stenosis. Mild bilateral neural foraminal stenosis. L3-L4: Large right asymmetric disc bulge with endplate spurring and moderate facet hypertrophy. No central spinal canal stenosis. Severe right neural foraminal stenosis. L4-L5: Small disc bulge with superimposed small central disc protrusion. No spinal canal stenosis. No neural foraminal stenosis. L5-S1: Mild central disc protrusion with mild facet hypertrophy. No spinal canal stenosis. No neural foraminal stenosis. Visualized sacrum: Normal. IMPRESSION: 1. L2-L3 disc and endplate signal changes, nonspecific. This may be degenerative, however, discitis-osteomyelitis may also cause this appearance. 2. Severe right L3-4 neural foraminal stenosis secondary to large right asymmetric disc bulge and endplate spurring. 3. Narrowing of both lateral recesses and mild bilateral neural foraminal stenosis at L2-L3. These findings could contribute to radicular symptoms in the L2 or L3 distributions. 4. No thoracic spinal canal or neural foraminal stenosis. 5. Diffusely heterogeneous bone marrow signal, nonspecific. This can be seen in the setting of chronic anemia or smoking history. An infiltrative process such as metastatic disease is also a consideration. 6. Incidental heterogeneous and enlarged thyroid. Recommend thyroid US. Reference: J Am Coll Radiol. 2015 Feb;12(2): 143-50  Electronically Signed   By: Ulyses Jarred M.D.   On: 05/10/2021 19:39   DG Chest Port 1 View  Result Date: 05/09/2021 CLINICAL DATA:  Weakness.  Hypertension. EXAM: PORTABLE CHEST 1 VIEW COMPARISON:  06/17/2018 FINDINGS: Midline trachea. Mild cardiomegaly. Tortuous thoracic aorta. No pleural effusion or pneumothorax. Clear lungs. No congestive failure. IMPRESSION: Cardiomegaly without congestive failure.  Electronically Signed   By: Abigail Miyamoto M.D.   On: 05/09/2021 18:09   DG Abd 2 Views  Result Date: 05/11/2021 CLINICAL DATA:  Checking for foreign body and alimentary tract EXAM: ABDOMEN - 2 VIEW COMPARISON:  CT 06/27/2017 FINDINGS: Nonobstructive bowel gas pattern. No organomegaly or free air. Surgical clips noted in the region of the proximal stomach. Calcified phleboliths in the pelvis. No unexpected radiopaque foreign body. IMPRESSION: No evidence of unexpected radiopaque foreign body. No bowel obstruction or free air. Electronically Signed   By: Rolm Baptise M.D.   On: 05/11/2021 13:20   ECHOCARDIOGRAM COMPLETE  Result Date: 05/10/2021    ECHOCARDIOGRAM REPORT   Patient Name:   MARTYNA THORNS Date of Exam: 05/10/2021 Medical Rec #:  [id number]        Height:       64.0 in Accession #:    [id number]       Weight:       180.8 lb Date of Birth:  [birth year]        BSA:          1.874 m Patient Age:    21 years         BP:           148/68 mmHg Patient Gender: F                HR:           67 bpm. Exam Location:  Inpatient Procedure: 2D Echo, Cardiac Doppler and Color Doppler Indications:    Elevated Troponin  History:        Patient has no prior history of Echocardiogram examinations.                 Risk Factors:Hypertension, Dyslipidemia and Sleep Apnea. History                 of DVT. Thyroid disease. GERD.  Sonographer:    Darlina Sicilian RDCS Referring Phys: [id number] [place]  1. Left ventricular ejection fraction, by estimation, is 70 to 75%. The left ventricle has hyperdynamic function. The left ventricle has no regional wall motion abnormalities. There is mild left ventricular hypertrophy of the septal segment. Left ventricular diastolic parameters are consistent with Grade I diastolic dysfunction (impaired relaxation).  2. Right ventricular systolic function is normal. The right ventricular size is normal. There is mildly elevated pulmonary artery systolic pressure. The  estimated right ventricular systolic pressure is 00.1 mmHg.  3. The mitral valve is normal in structure. No evidence of mitral valve regurgitation. No evidence of mitral stenosis.  4. The aortic valve is normal in structure. Aortic valve regurgitation is mild. No aortic stenosis is present.  5. The inferior vena cava is normal in size with greater than 50% respiratory variability, suggesting right atrial pressure of 3 mmHg. FINDINGS  Left Ventricle: Left ventricular ejection fraction, by estimation, is 70 to 75%. The left ventricle has hyperdynamic function. The left ventricle has no regional wall motion abnormalities. The left ventricular internal cavity size was normal in size. There is mild left ventricular hypertrophy of the septal  segment. Left ventricular diastolic parameters are consistent with Grade I diastolic dysfunction (impaired relaxation). Right Ventricle: The right ventricular size is normal. No increase in right ventricular wall thickness. Right ventricular systolic function is normal. There is mildly elevated pulmonary artery systolic pressure. The tricuspid regurgitant velocity is 2.85  m/s, and with an assumed right atrial pressure of 8 mmHg, the estimated right ventricular systolic pressure is 57.8 mmHg. Left Atrium: Left atrial size was normal in size. Right Atrium: Right atrial size was normal in size. Pericardium: There is no evidence of pericardial effusion. Mitral Valve: The mitral valve is normal in structure. No evidence of mitral valve regurgitation. No evidence of mitral valve stenosis. Tricuspid Valve: The tricuspid valve is normal in structure. Tricuspid valve regurgitation is mild . No evidence of tricuspid stenosis. Aortic Valve: The aortic valve is normal in structure. Aortic valve regurgitation is mild. Aortic regurgitation PHT measures 752 msec. No aortic stenosis is present. Pulmonic Valve: The pulmonic valve was normal in structure. Pulmonic valve regurgitation is not  visualized. No evidence of pulmonic stenosis. Aorta: The aortic root is normal in size and structure. Venous: The inferior vena cava is normal in size with greater than 50% respiratory variability, suggesting right atrial pressure of 3 mmHg. IAS/Shunts: No atrial level shunt detected by color flow Doppler.  LEFT VENTRICLE PLAX 2D LVIDd:         3.50 cm  Diastology LVIDs:         2.60 cm  LV e' medial:    5.26 cm/s LV PW:         0.80 cm  LV E/e' medial:  10.3 LV IVS:        1.45 cm  LV e' lateral:   5.06 cm/s LVOT diam:     2.00 cm  LV E/e' lateral: 10.7 LV SV:         65 LV SV Index:   35 LVOT Area:     3.14 cm  RIGHT VENTRICLE RV S prime:     7.73 cm/s TAPSE (M-mode): 1.8 cm LEFT ATRIUM           Index LA diam:      3.70 cm 1.97 cm/m LA Vol (A4C): 29.6 ml 15.79 ml/m  AORTIC VALVE LVOT Vmax:   110.00 cm/s LVOT Vmean:  70.400 cm/s LVOT VTI:    0.207 m AI PHT:      752 msec  AORTA Ao Root diam: 3.20 cm Ao Asc diam:  3.20 cm MITRAL VALVE               TRICUSPID VALVE MV Area (PHT): 2.72 cm    TR Peak grad:   32.5 mmHg MV Decel Time: 279 msec    TR Vmax:        285.00 cm/s MV E velocity: 54.00 cm/s MV A velocity: 89.35 cm/s  SHUNTS MV E/A ratio:  0.60        Systemic VTI:  0.21 m                            Systemic Diam: 2.00 cm Candee Furbish MD Electronically signed by Candee Furbish MD Signature Date/Time: 05/10/2021/11:17:24 AM    Final    US THYROID  Result Date: 05/12/2021 CLINICAL DATA:  Abnormal appearance of the thyroid on MRI EXAM: THYROID ULTRASOUND TECHNIQUE: Ultrasound examination of the thyroid gland and adjacent soft tissues was performed. COMPARISON:  None. FINDINGS: Parenchymal Echotexture: Moderately heterogeneous  Isthmus: 0.2 cm Right lobe: 4.9 x 2.9 x 2.4 cm Left lobe: 4.8 x 2.1 x 1.6 cm _________________________________________________________ Estimated total number of nodules >/= 1 cm: 0 Number of spongiform nodules >/=  2 cm not described below (TR1): 0 Number of mixed cystic and solid nodules  >/= 1.5 cm not described below (TR2): 0 _________________________________________________________ 0.9 cm mixed solid cystic nodule in the left thyroid lobe does not meet criteria for imaging follow-up or surveillance. IMPRESSION: Diffuse heterogeneity of the thyroid parenchyma without suspicious nodule. The above is in keeping with the ACR TI-RADS recommendations - J Am Coll Radiol 2017;14:587-595. Electronically Signed   By: Miachel Roux M.D.   On: 05/12/2021 07:56      Subjective: Back pain is significantly improved.  Discharge Exam: Vitals:   05/13/21 0927 05/13/21 0938  BP:  (!) 164/73  Pulse:  61  Resp: 13 16  Temp:    SpO2:  98%   Vitals:   05/12/21 2014 05/13/21 0551 05/13/21 0927 05/13/21 0938  BP: (!) 158/81 (!) 164/67  (!) 164/73  Pulse: 64 (!) 56  61  Resp: 20 15 13 16   Temp: 98 F (36.7 C) 97.8 F (36.6 C)    TempSrc: Oral Oral    SpO2: 97% 98%  98%  Weight:      Height:        General: Pt is alert, awake, not in acute distress Cardiovascular: RRR, S1/S2 +, no rubs, no gallops Respiratory: CTA bilaterally, no wheezing, no rhonchi Abdominal: Soft, NT, ND, bowel sounds + Extremities: no edema, no cyanosis    The results of significant diagnostics from this hospitalization (including imaging, microbiology, ancillary and laboratory) are listed below for reference.     Microbiology: Recent Results (from the past 240 hour(s))  Resp Panel by RT-PCR (Flu A&B, Covid) Nasopharyngeal Swab     Status: None   Collection Time: 05/09/21  5:50 PM   Specimen: Nasopharyngeal Swab; Nasopharyngeal(NP) swabs in vial transport medium  Result Value Ref Range Status   SARS Coronavirus 2 by RT PCR NEGATIVE NEGATIVE Final    Comment: (NOTE) SARS-CoV-2 target nucleic acids are NOT DETECTED.  The SARS-CoV-2 RNA is generally detectable in upper respiratory specimens during the acute phase of infection. The lowest concentration of SARS-CoV-2 viral copies this assay can detect  is 138 copies/mL. A negative result does not preclude SARS-Cov-2 infection and should not be used as the sole basis for treatment or other patient management decisions. A negative result may occur with  improper specimen collection/handling, submission of specimen other than nasopharyngeal swab, presence of viral mutation(s) within the areas targeted by this assay, and inadequate number of viral copies(<138 copies/mL). A negative result must be combined with clinical observations, patient history, and epidemiological information. The expected result is Negative.  Fact Sheet for Patients:  EntrepreneurPulse.com.au  Fact Sheet for Healthcare Providers:  IncredibleEmployment.be  This test is no t yet approved or cleared by the [place] FDA and  has been authorized for detection and/or diagnosis of SARS-CoV-2 by FDA under an Emergency Use Authorization (EUA). This EUA will remain  in effect (meaning this test can be used) for the duration of the COVID-19 declaration under Section 564(b)(1) of the Act, 21 U.S.C.section 360bbb-3(b)(1), unless the authorization is terminated  or revoked sooner.       Influenza A by PCR NEGATIVE NEGATIVE Final   Influenza B by PCR NEGATIVE NEGATIVE Final    Comment: (NOTE) The Xpert Xpress SARS-CoV-2/FLU/RSV plus assay is  intended as an aid in the diagnosis of influenza from Nasopharyngeal swab specimens and should not be used as a sole basis for treatment. Nasal washings and aspirates are unacceptable for Xpert Xpress SARS-CoV-2/FLU/RSV testing.  Fact Sheet for Patients: EntrepreneurPulse.com.au  Fact Sheet for Healthcare Providers: IncredibleEmployment.be  This test is not yet approved or cleared by the [place] FDA and has been authorized for detection and/or diagnosis of SARS-CoV-2 by FDA under an Emergency Use Authorization (EUA). This EUA will remain in effect  (meaning this test can be used) for the duration of the COVID-19 declaration under Section 564(b)(1) of the Act, 21 U.S.C. section 360bbb-3(b)(1), unless the authorization is terminated or revoked.  Performed at [facility], [place] [address], [place], [place] [address]   Urine Culture     Status: Abnormal   Collection Time: 05/09/21 10:30 PM   Specimen: In/Out Cath Urine  Result Value Ref Range Status   Specimen Description   Final    IN/OUT CATH URINE Performed at [place] [address] [place], [place] [address]    Special Requests   Final    NONE Performed at [facility], [place] [address] [place], [place] [address]    Culture 70,000 COLONIES/mL ESCHERICHIA COLI (A)  Final   Report Status 05/11/2021 FINAL  Final   Organism ID, Bacteria ESCHERICHIA COLI (A)  Final      Susceptibility   Escherichia coli - MIC*    AMPICILLIN >=32 RESISTANT Resistant     CEFAZOLIN >=64 RESISTANT Resistant     CEFEPIME <=0.12 SENSITIVE Sensitive     CEFTRIAXONE 0.5 SENSITIVE Sensitive     CIPROFLOXACIN <=0.25 SENSITIVE Sensitive     GENTAMICIN <=1 SENSITIVE Sensitive     IMIPENEM <=0.25 SENSITIVE Sensitive     NITROFURANTOIN <=16 SENSITIVE Sensitive     TRIMETH/SULFA <=20 SENSITIVE Sensitive     AMPICILLIN/SULBACTAM >=32 RESISTANT Resistant     PIP/TAZO 8 SENSITIVE Sensitive     * 70,000 COLONIES/mL ESCHERICHIA COLI  Culture, blood (routine x 2)     Status: None (Preliminary result)   Collection Time: 05/11/21  2:07 PM   Specimen: BLOOD  Result Value Ref Range Status   Specimen Description   Final    BLOOD RIGHT HAND Performed at [place] [address] [place], [place] [address]    Special Requests   Final    BOTTLES DRAWN AEROBIC AND ANAEROBIC Blood Culture adequate volume Performed at [place] [address], [place], [place] [address]    Culture   Final    NO GROWTH 2  DAYS Performed at [place] [facility] [address] [place], [place] [address]    Report Status PENDING  Incomplete  Culture, blood (routine x 2)     Status: None (Preliminary result)   Collection Time: 05/11/21  2:07 PM   Specimen: BLOOD  Result Value Ref Range Status   Specimen Description   Final    BLOOD BLOOD RIGHT FOREARM Performed at [place] [address], [place], [place] [address]    Special Requests   Final    BOTTLES DRAWN AEROBIC ONLY Blood Culture adequate volume Performed at [place] [address] [place], [place] [address]    Culture   Final    NO GROWTH 2 DAYS Performed at [place] [address] [place], [place] [address]    Report Status PENDING  Incomplete  SARS CORONAVIRUS 2 (TAT 6-24 HRS) Nasopharyngeal Nasopharyngeal Swab     Status: None   Collection Time: 05/12/21  6:47 PM   Specimen: Nasopharyngeal Swab  Result Value Ref Range Status   SARS Coronavirus 2 NEGATIVE NEGATIVE Final    Comment: (NOTE) SARS-CoV-2 target nucleic acids are NOT DETECTED.  The SARS-CoV-2 RNA is generally detectable in upper and lower respiratory specimens during the acute phase of infection. Negative results do not preclude SARS-CoV-2 infection, do not rule out co-infections with other pathogens, and should not be used as the sole basis for treatment or other patient management decisions. Negative results must be combined with clinical observations, patient history, and epidemiological information. The expected result is Negative.  Fact Sheet for Patients: SugarRoll.be  Fact Sheet for Healthcare Providers: https://www.woods-mathews.com/  This test is not yet approved or cleared by the [place] FDA and  has been authorized for detection and/or diagnosis of SARS-CoV-2 by FDA under an Emergency Use Authorization (EUA). This EUA will remain  in effect (meaning this  test can be used) for the duration of the COVID-19 declaration under Se ction 564(b)(1) of the Act, 21 U.S.C. section 360bbb-3(b)(1), unless the authorization is terminated or revoked sooner.  Performed at [facility], [place] [address], [place], [place] [address]      Labs: BNP (last 3 results) No results for input(s): BNP in the last 8760 hours. Basic Metabolic Panel: Recent Labs  Lab 05/09/21 1750 05/10/21 0615  NA 137 139  K 4.1 3.7  CL 105 106  CO2 24 25  GLUCOSE 97 100*  BUN 19 20  CREATININE 0.60 0.50  CALCIUM 9.5 9.7   Liver Function Tests: Recent Labs  Lab 05/09/21 1750  AST 32  ALT 21  ALKPHOS 51  BILITOT 0.8  PROT 6.7  ALBUMIN 3.1*   No results for input(s): LIPASE, AMYLASE in the last 168 hours. No results for input(s): AMMONIA in the last 168 hours. CBC: Recent Labs  Lab 05/09/21 1750 05/10/21 0615  WBC 8.1 8.5  NEUTROABS 5.3  --   HGB 12.7 12.3  HCT 37.5 37.0  MCV 92.8 94.4  PLT 200 184   Cardiac Enzymes: No results for input(s): CKTOTAL, CKMB, CKMBINDEX, TROPONINI in the last 168 hours. BNP: Invalid input(s): POCBNP CBG: No results for input(s): GLUCAP in the last 168 hours. D-Dimer No results for input(s): DDIMER in the last 72 hours. Hgb A1c No results for input(s): HGBA1C in the last 72 hours. Lipid Profile No results for input(s): CHOL, HDL, LDLCALC, TRIG, CHOLHDL, LDLDIRECT in the last 72 hours. Thyroid function studies Recent Labs    05/12/21 1432  TSH 1.848   Anemia work up No results for input(s): VITAMINB12, FOLATE, FERRITIN, TIBC, IRON, RETICCTPCT in the last 72 hours. Urinalysis    Component Value Date/Time   COLORURINE YELLOW 05/09/2021 2230   APPEARANCEUR CLEAR 05/09/2021 2230   LABSPEC 1.025 05/09/2021 2230   PHURINE 5.5 05/09/2021 2230   GLUCOSEU NEGATIVE 05/09/2021 2230   HGBUR SMALL (A) 05/09/2021 2230   BILIRUBINUR NEGATIVE 05/09/2021 2230   BILIRUBINUR negative 08/26/2019 1454   BILIRUBINUR  Negative 03/31/2015 1320   KETONESUR NEGATIVE 05/09/2021 2230   PROTEINUR NEGATIVE 05/09/2021 2230   UROBILINOGEN 0.2 08/26/2019 1454   UROBILINOGEN 0.2 12/03/2013 1530   NITRITE POSITIVE (A) 05/09/2021 2230   LEUKOCYTESUR NEGATIVE 05/09/2021 2230   Sepsis Labs Invalid input(s): PROCALCITONIN,  WBC,  LACTICIDVEN Microbiology Recent Results (from the past 240 hour(s))  Resp  Panel by RT-PCR (Flu A&B, Covid) Nasopharyngeal Swab     Status: None   Collection Time: 05/09/21  5:50 PM   Specimen: Nasopharyngeal Swab; Nasopharyngeal(NP) swabs in vial transport medium  Result Value Ref Range Status   SARS Coronavirus 2 by RT PCR NEGATIVE NEGATIVE Final    Comment: (NOTE) SARS-CoV-2 target nucleic acids are NOT DETECTED.  The SARS-CoV-2 RNA is generally detectable in upper respiratory specimens during the acute phase of infection. The lowest concentration of SARS-CoV-2 viral copies this assay can detect is 138 copies/mL. A negative result does not preclude SARS-Cov-2 infection and should not be used as the sole basis for treatment or other patient management decisions. A negative result may occur with  improper specimen collection/handling, submission of specimen other than nasopharyngeal swab, presence of viral mutation(s) within the areas targeted by this assay, and inadequate number of viral copies(<138 copies/mL). A negative result must be combined with clinical observations, patient history, and epidemiological information. The expected result is Negative.  Fact Sheet for Patients:  EntrepreneurPulse.com.au  Fact Sheet for Healthcare Providers:  IncredibleEmployment.be  This test is no t yet approved or cleared by the [place] FDA and  has been authorized for detection and/or diagnosis of SARS-CoV-2 by FDA under an Emergency Use Authorization (EUA). This EUA will remain  in effect (meaning this test can be used) for the duration of  the COVID-19 declaration under Section 564(b)(1) of the Act, 21 U.S.C.section 360bbb-3(b)(1), unless the authorization is terminated  or revoked sooner.       Influenza A by PCR NEGATIVE NEGATIVE Final   Influenza B by PCR NEGATIVE NEGATIVE Final    Comment: (NOTE) The Xpert Xpress SARS-CoV-2/FLU/RSV plus assay is intended as an aid in the diagnosis of influenza from Nasopharyngeal swab specimens and should not be used as a sole basis for treatment. Nasal washings and aspirates are unacceptable for Xpert Xpress SARS-CoV-2/FLU/RSV testing.  Fact Sheet for Patients: EntrepreneurPulse.com.au  Fact Sheet for Healthcare Providers: IncredibleEmployment.be  This test is not yet approved or cleared by the [place] FDA and has been authorized for detection and/or diagnosis of SARS-CoV-2 by FDA under an Emergency Use Authorization (EUA). This EUA will remain in effect (meaning this test can be used) for the duration of the COVID-19 declaration under Section 564(b)(1) of the Act, 21 U.S.C. section 360bbb-3(b)(1), unless the authorization is terminated or revoked.  Performed at [facility], [place] [address] [place], [place] [address]   Urine Culture     Status: Abnormal   Collection Time: 05/09/21 10:30 PM   Specimen: In/Out Cath Urine  Result Value Ref Range Status   Specimen Description   Final    IN/OUT CATH URINE Performed at [place] [address], [place], [place] [address]    Special Requests   Final    NONE Performed at [facility], [place] [address], [place], [place] [address]    Culture 70,000 COLONIES/mL ESCHERICHIA COLI (A)  Final   Report Status 05/11/2021 FINAL  Final   Organism ID, Bacteria ESCHERICHIA COLI (A)  Final      Susceptibility   Escherichia coli - MIC*    AMPICILLIN >=32 RESISTANT Resistant     CEFAZOLIN >=64 RESISTANT Resistant     CEFEPIME <=0.12  SENSITIVE Sensitive     CEFTRIAXONE 0.5 SENSITIVE Sensitive     CIPROFLOXACIN <=0.25 SENSITIVE Sensitive     GENTAMICIN <=1 SENSITIVE Sensitive     IMIPENEM <=0.25 SENSITIVE Sensitive  NITROFURANTOIN <=16 SENSITIVE Sensitive     TRIMETH/SULFA <=20 SENSITIVE Sensitive     AMPICILLIN/SULBACTAM >=32 RESISTANT Resistant     PIP/TAZO 8 SENSITIVE Sensitive     * 70,000 COLONIES/mL ESCHERICHIA COLI  Culture, blood (routine x 2)     Status: None (Preliminary result)   Collection Time: 05/11/21  2:07 PM   Specimen: BLOOD  Result Value Ref Range Status   Specimen Description   Final    BLOOD RIGHT HAND Performed at [place] [address] [place], [place] [address]    Special Requests   Final    BOTTLES DRAWN AEROBIC AND ANAEROBIC Blood Culture adequate volume Performed at [place] [address], [place], [place] [address]    Culture   Final    NO GROWTH 2 DAYS Performed at [facility] [address] [place], [place] [address]    Report Status PENDING  Incomplete  Culture, blood (routine x 2)     Status: None (Preliminary result)   Collection Time: 05/11/21  2:07 PM   Specimen: BLOOD  Result Value Ref Range Status   Specimen Description   Final    BLOOD BLOOD RIGHT FOREARM Performed at [place] [facility] [address] [place], [place] [address]    Special Requests   Final    BOTTLES DRAWN AEROBIC ONLY Blood Culture adequate volume Performed at [facility] [address] [place], [place] [address]    Culture   Final    NO GROWTH 2 DAYS Performed at [place] [address] [place], [place] [address]    Report Status PENDING  Incomplete  SARS CORONAVIRUS 2 (TAT 6-24 HRS) Nasopharyngeal Nasopharyngeal Swab     Status: None   Collection Time: 05/12/21  6:47 PM   Specimen: Nasopharyngeal Swab  Result Value Ref Range Status   SARS Coronavirus 2 NEGATIVE NEGATIVE Final     Comment: (NOTE) SARS-CoV-2 target nucleic acids are NOT DETECTED.  The SARS-CoV-2 RNA is generally detectable in upper and lower respiratory specimens during the acute phase of infection. Negative results do not preclude SARS-CoV-2 infection, do not rule out co-infections with other pathogens, and should not be used as the sole basis for treatment or other patient management decisions. Negative results must be combined with clinical observations, patient history, and epidemiological information. The expected result is Negative.  Fact Sheet for Patients: SugarRoll.be  Fact Sheet for Healthcare Providers: https://www.woods-mathews.com/  This test is not yet approved or cleared by the [place] FDA and  has been authorized for detection and/or diagnosis of SARS-CoV-2 by FDA under an Emergency Use Authorization (EUA). This EUA will remain  in effect (meaning this test can be used) for the duration of the COVID-19 declaration under Se ction 564(b)(1) of the Act, 21 U.S.C. section 360bbb-3(b)(1), unless the authorization is terminated or revoked sooner.  Performed at [facility], [place] [address], [place], [place] [address]      Time coordinating discharge: 35 minutes  SIGNED:   Cordelia Poche, MD [facility] 05/13/2021, 1:10 PM

## 2021-05-13 NOTE — TOC Transition Note (Signed)
Transition of Care [facility]) - CM/SW Discharge Note   Patient Details  Name: Melissa Murphy MRN: [id number] Date of Birth: 02-05-1936  [facility] CM/SW Contact:  Ross Ludwig, LCSW Phone Number: 05/13/2021, 2:05 PM   Clinical Narrative:     CSW spoke to patient and her daughter via sign language interpretor.  CSW explained SNF and what to expect at facility.  CSW explained how insurance will pay for stay, and how patient will be transported to SNF.  CSW discussed how often therapy will typically see patient at SNF.  CSW confirmed with daughter that they have chosen [facility] for [facility].  CSW spoke to [facility], they can accept patient today once she receives her Covid Booster.  CSW spoke to bedside nurse and attending physician, booster has been ordered.  Patient to be d/c'ed today to [facility] room 1201P.  Patient and family agreeable to plans will transport via ems RN to call report [phone].  Patient's daughter would like to be called once EMS arrives.  CSW signing off, please reconsult with other social work needs.    Final next level of care: Skilled Nursing Facility Barriers to Discharge: Barriers Resolved   Patient Goals and CMS Choice Patient states their goals for this hospitalization and ongoing recovery are:: To go to SNF for short term rehab, then return back home. CMS Medicare.gov Compare Post Acute Care list provided to:: Patient Choice offered to / list presented to : Patient, Adult Children  Discharge Placement PASRR number recieved: 05/11/21            [facility] Patient to be transferred to facility by: [facility] Name of family member notified: Daughter Vinnie Level Patient and family notified of of transfer: 05/13/21  Discharge Plan and Services   Discharge Planning Services: CM Consult                                 Social Determinants of Health (SDOH) Interventions     Readmission Risk  Interventions No flowsheet data found.

## 2021-05-14 ENCOUNTER — Encounter: Payer: Self-pay | Admitting: Internal Medicine

## 2021-05-14 DIAGNOSIS — M6259 Muscle wasting and atrophy, not elsewhere classified, multiple sites: Secondary | ICD-10-CM | POA: Diagnosis not present

## 2021-05-14 DIAGNOSIS — M4646 Discitis, unspecified, lumbar region: Secondary | ICD-10-CM | POA: Diagnosis not present

## 2021-05-14 DIAGNOSIS — I878 Other specified disorders of veins: Secondary | ICD-10-CM | POA: Diagnosis not present

## 2021-05-14 DIAGNOSIS — R946 Abnormal results of thyroid function studies: Secondary | ICD-10-CM | POA: Diagnosis not present

## 2021-05-14 DIAGNOSIS — I10 Essential (primary) hypertension: Secondary | ICD-10-CM | POA: Diagnosis not present

## 2021-05-14 DIAGNOSIS — R2689 Other abnormalities of gait and mobility: Secondary | ICD-10-CM | POA: Diagnosis not present

## 2021-05-14 DIAGNOSIS — Z1331 Encounter for screening for depression: Secondary | ICD-10-CM | POA: Diagnosis not present

## 2021-05-14 DIAGNOSIS — R29898 Other symptoms and signs involving the musculoskeletal system: Secondary | ICD-10-CM | POA: Diagnosis not present

## 2021-05-14 DIAGNOSIS — H9190 Unspecified hearing loss, unspecified ear: Secondary | ICD-10-CM | POA: Diagnosis not present

## 2021-05-14 DIAGNOSIS — R262 Difficulty in walking, not elsewhere classified: Secondary | ICD-10-CM | POA: Diagnosis not present

## 2021-05-14 DIAGNOSIS — R32 Unspecified urinary incontinence: Secondary | ICD-10-CM | POA: Diagnosis not present

## 2021-05-14 DIAGNOSIS — A498 Other bacterial infections of unspecified site: Secondary | ICD-10-CM | POA: Diagnosis not present

## 2021-05-14 DIAGNOSIS — M4640 Discitis, unspecified, site unspecified: Secondary | ICD-10-CM | POA: Diagnosis not present

## 2021-05-14 DIAGNOSIS — R269 Unspecified abnormalities of gait and mobility: Secondary | ICD-10-CM | POA: Diagnosis not present

## 2021-05-14 DIAGNOSIS — M5136 Other intervertebral disc degeneration, lumbar region: Secondary | ICD-10-CM | POA: Diagnosis not present

## 2021-05-14 DIAGNOSIS — I959 Hypotension, unspecified: Secondary | ICD-10-CM | POA: Diagnosis not present

## 2021-05-14 DIAGNOSIS — I248 Other forms of acute ischemic heart disease: Secondary | ICD-10-CM | POA: Diagnosis not present

## 2021-05-14 DIAGNOSIS — Z7401 Bed confinement status: Secondary | ICD-10-CM | POA: Diagnosis not present

## 2021-05-14 DIAGNOSIS — K5901 Slow transit constipation: Secondary | ICD-10-CM | POA: Diagnosis not present

## 2021-05-14 DIAGNOSIS — M6281 Muscle weakness (generalized): Secondary | ICD-10-CM | POA: Diagnosis not present

## 2021-05-14 DIAGNOSIS — M545 Low back pain, unspecified: Secondary | ICD-10-CM | POA: Diagnosis not present

## 2021-05-14 NOTE — Progress Notes (Addendum)
[facility] here to pick up patient and transport her to [facility] Rm 1201P. Tried calling [facility] to give update on patient with no answer.   Called patient's daughter, Lesle Reek, at [phone] to let her know that her Mom was on her way to [facility].   Patient is stable for transfer.

## 2021-05-16 LAB — CULTURE, BLOOD (ROUTINE X 2)
Culture: NO GROWTH
Culture: NO GROWTH
Special Requests: ADEQUATE
Special Requests: ADEQUATE

## 2021-05-17 DIAGNOSIS — I878 Other specified disorders of veins: Secondary | ICD-10-CM | POA: Diagnosis not present

## 2021-05-17 DIAGNOSIS — A498 Other bacterial infections of unspecified site: Secondary | ICD-10-CM | POA: Diagnosis not present

## 2021-05-17 DIAGNOSIS — R946 Abnormal results of thyroid function studies: Secondary | ICD-10-CM | POA: Diagnosis not present

## 2021-05-17 DIAGNOSIS — K5901 Slow transit constipation: Secondary | ICD-10-CM | POA: Diagnosis not present

## 2021-05-17 DIAGNOSIS — M4646 Discitis, unspecified, lumbar region: Secondary | ICD-10-CM | POA: Diagnosis not present

## 2021-05-17 DIAGNOSIS — R32 Unspecified urinary incontinence: Secondary | ICD-10-CM | POA: Diagnosis not present

## 2021-05-17 DIAGNOSIS — I10 Essential (primary) hypertension: Secondary | ICD-10-CM | POA: Diagnosis not present

## 2021-05-17 DIAGNOSIS — H9190 Unspecified hearing loss, unspecified ear: Secondary | ICD-10-CM | POA: Diagnosis not present

## 2021-05-17 DIAGNOSIS — I248 Other forms of acute ischemic heart disease: Secondary | ICD-10-CM | POA: Diagnosis not present

## 2021-05-17 DIAGNOSIS — M5136 Other intervertebral disc degeneration, lumbar region: Secondary | ICD-10-CM | POA: Diagnosis not present

## 2021-05-17 DIAGNOSIS — R269 Unspecified abnormalities of gait and mobility: Secondary | ICD-10-CM | POA: Diagnosis not present

## 2021-05-20 ENCOUNTER — Other Ambulatory Visit: Payer: Self-pay | Admitting: *Deleted

## 2021-05-20 NOTE — Patient Outreach (Signed)
Member screened for potential [facility] Care Management needs. Mrs. Steedman resides in [facility].   Communication sent [facility] social workers to make aware writer is following for potential [facility] Care Management services.   Will continue to follow.   Marthenia Rolling, MSN, RN,BSN [facility] Acute Care Coordinator [phone] [facility]) [phone]  (Toll free office)

## 2021-05-25 DIAGNOSIS — I10 Essential (primary) hypertension: Secondary | ICD-10-CM | POA: Diagnosis not present

## 2021-05-25 DIAGNOSIS — R29898 Other symptoms and signs involving the musculoskeletal system: Secondary | ICD-10-CM | POA: Diagnosis not present

## 2021-05-25 DIAGNOSIS — M4640 Discitis, unspecified, site unspecified: Secondary | ICD-10-CM | POA: Diagnosis not present

## 2021-05-25 DIAGNOSIS — I248 Other forms of acute ischemic heart disease: Secondary | ICD-10-CM | POA: Diagnosis not present

## 2021-05-25 DIAGNOSIS — K5901 Slow transit constipation: Secondary | ICD-10-CM | POA: Diagnosis not present

## 2021-05-25 DIAGNOSIS — R946 Abnormal results of thyroid function studies: Secondary | ICD-10-CM | POA: Diagnosis not present

## 2021-05-27 DIAGNOSIS — R269 Unspecified abnormalities of gait and mobility: Secondary | ICD-10-CM | POA: Diagnosis not present

## 2021-05-27 DIAGNOSIS — I10 Essential (primary) hypertension: Secondary | ICD-10-CM | POA: Diagnosis not present

## 2021-05-27 DIAGNOSIS — I878 Other specified disorders of veins: Secondary | ICD-10-CM | POA: Diagnosis not present

## 2021-05-27 DIAGNOSIS — I248 Other forms of acute ischemic heart disease: Secondary | ICD-10-CM | POA: Diagnosis not present

## 2021-05-27 DIAGNOSIS — K5901 Slow transit constipation: Secondary | ICD-10-CM | POA: Diagnosis not present

## 2021-05-27 DIAGNOSIS — R29898 Other symptoms and signs involving the musculoskeletal system: Secondary | ICD-10-CM | POA: Diagnosis not present

## 2021-05-27 DIAGNOSIS — R946 Abnormal results of thyroid function studies: Secondary | ICD-10-CM | POA: Diagnosis not present

## 2021-05-27 DIAGNOSIS — Z1331 Encounter for screening for depression: Secondary | ICD-10-CM | POA: Diagnosis not present

## 2021-05-27 DIAGNOSIS — M4640 Discitis, unspecified, site unspecified: Secondary | ICD-10-CM | POA: Diagnosis not present

## 2021-05-27 DIAGNOSIS — M5136 Other intervertebral disc degeneration, lumbar region: Secondary | ICD-10-CM | POA: Diagnosis not present

## 2021-06-02 ENCOUNTER — Ambulatory Visit: Payer: Medicare Other | Admitting: Orthopedic Surgery

## 2021-06-08 ENCOUNTER — Inpatient Hospital Stay: Payer: Medicare Other | Admitting: Internal Medicine

## 2021-06-08 ENCOUNTER — Telehealth: Payer: Self-pay

## 2021-06-08 NOTE — Telephone Encounter (Signed)
Please have patient monitor blood pressure at home daily. Bring recorded blood pressures to next office visit. Thank you for update.

## 2021-06-08 NOTE — Telephone Encounter (Signed)
Called and discussed with Vinnie Level, who will relay the message to the patient and make sure she has a BP monitor.

## 2021-06-08 NOTE — Telephone Encounter (Signed)
Patient's daughter, Lesle Reek ([phone]) states rehab gave BP med that causes the patient to experience weakness and just wanted Amy to know. Amlodipine besylate 5 mg 1 a day. Emmajean states it does help with swelling the side affects are rough for her. She has an OV in 2 weeks ago. Patient has stopped medication. To Amy

## 2021-06-17 ENCOUNTER — Ambulatory Visit (INDEPENDENT_AMBULATORY_CARE_PROVIDER_SITE_OTHER): Payer: Medicare Other | Admitting: Nurse Practitioner

## 2021-06-17 ENCOUNTER — Emergency Department (HOSPITAL_COMMUNITY): Payer: Medicare Other

## 2021-06-17 ENCOUNTER — Other Ambulatory Visit: Payer: Self-pay

## 2021-06-17 ENCOUNTER — Encounter: Payer: Self-pay | Admitting: Nurse Practitioner

## 2021-06-17 ENCOUNTER — Emergency Department (HOSPITAL_COMMUNITY)
Admission: EM | Admit: 2021-06-17 | Discharge: 2021-06-18 | Disposition: A | Discharge status: Home / Self Care | Payer: Medicare Other | Attending: Emergency Medicine | Admitting: Emergency Medicine

## 2021-06-17 VITALS — BP 122/80 | HR 70 | Temp 97.1°F

## 2021-06-17 DIAGNOSIS — Z23 Encounter for immunization: Secondary | ICD-10-CM | POA: Diagnosis not present

## 2021-06-17 DIAGNOSIS — M2578 Osteophyte, vertebrae: Secondary | ICD-10-CM | POA: Diagnosis not present

## 2021-06-17 DIAGNOSIS — Z79899 Other long term (current) drug therapy: Secondary | ICD-10-CM | POA: Diagnosis not present

## 2021-06-17 DIAGNOSIS — Y92009 Unspecified place in unspecified non-institutional (private) residence as the place of occurrence of the external cause: Secondary | ICD-10-CM | POA: Diagnosis not present

## 2021-06-17 DIAGNOSIS — E785 Hyperlipidemia, unspecified: Secondary | ICD-10-CM | POA: Diagnosis not present

## 2021-06-17 DIAGNOSIS — I10 Essential (primary) hypertension: Secondary | ICD-10-CM

## 2021-06-17 DIAGNOSIS — S0990XA Unspecified injury of head, initial encounter: Secondary | ICD-10-CM | POA: Diagnosis not present

## 2021-06-17 DIAGNOSIS — Z043 Encounter for examination and observation following other accident: Secondary | ICD-10-CM | POA: Diagnosis not present

## 2021-06-17 DIAGNOSIS — R0902 Hypoxemia: Secondary | ICD-10-CM | POA: Diagnosis not present

## 2021-06-17 DIAGNOSIS — S8011XA Contusion of right lower leg, initial encounter: Secondary | ICD-10-CM | POA: Diagnosis not present

## 2021-06-17 DIAGNOSIS — W1839XA Other fall on same level, initial encounter: Secondary | ICD-10-CM | POA: Diagnosis not present

## 2021-06-17 DIAGNOSIS — R29898 Other symptoms and signs involving the musculoskeletal system: Secondary | ICD-10-CM | POA: Diagnosis not present

## 2021-06-17 DIAGNOSIS — S199XXA Unspecified injury of neck, initial encounter: Secondary | ICD-10-CM | POA: Diagnosis not present

## 2021-06-17 DIAGNOSIS — F5101 Primary insomnia: Secondary | ICD-10-CM

## 2021-06-17 DIAGNOSIS — W19XXXA Unspecified fall, initial encounter: Secondary | ICD-10-CM | POA: Diagnosis not present

## 2021-06-17 DIAGNOSIS — K5903 Drug induced constipation: Secondary | ICD-10-CM

## 2021-06-17 DIAGNOSIS — Z7901 Long term (current) use of anticoagulants: Secondary | ICD-10-CM | POA: Diagnosis not present

## 2021-06-17 DIAGNOSIS — M549 Dorsalgia, unspecified: Secondary | ICD-10-CM | POA: Diagnosis not present

## 2021-06-17 DIAGNOSIS — M545 Low back pain, unspecified: Secondary | ICD-10-CM

## 2021-06-17 DIAGNOSIS — S8991XA Unspecified injury of right lower leg, initial encounter: Secondary | ICD-10-CM | POA: Diagnosis present

## 2021-06-17 DIAGNOSIS — M7989 Other specified soft tissue disorders: Secondary | ICD-10-CM | POA: Diagnosis not present

## 2021-06-17 DIAGNOSIS — M1611 Unilateral primary osteoarthritis, right hip: Secondary | ICD-10-CM | POA: Diagnosis not present

## 2021-06-17 MED ORDER — ZOLPIDEM TARTRATE 5 MG PO TABS: 5.0000 mg | ORAL_TABLET | Freq: Every day | ORAL | 0 refills | Status: AC

## 2021-06-17 MED ORDER — POLYETHYLENE GLYCOL 3350 17 G PO PACK: 17.0000 g | PACK | Freq: Every day | ORAL | Status: AC | PRN

## 2021-06-17 MED ORDER — SIMVASTATIN 20 MG PO TABS: ORAL_TABLET | ORAL | 1 refills | Status: AC

## 2021-06-17 NOTE — ED Provider Notes (Signed)
[facility] DEPT Provider Note   CSN: [id number] Arrival date & time: 06/17/21  2045     History No chief complaint on file.   Melissa Murphy is a 85 y.o. female.  Patient presents to the emergency department with a chief complaint of fall.  She is brought in by EMS.  She is deaf, and history is obtained through family fluent in [place].  Patient reports that she had a fall at home.  She was going up her steps at home, and fell backward landing on her low back and right hip.  She also complains of right lower leg pain.  She is not anticoagulated.  She denies hitting her head.  Denies any LOC.  She states that she does have some instability at baseline and was recently at a rehab center.  The history is provided by the patient. A language interpreter was used.      Past Medical History:  Diagnosis Date   Anemia    Arthritis    Cataract    Clotting disorder ([place])    Deaf    Congenital; Requires an interpreter   DVT (deep venous thrombosis) ([place])    in eye, and leg   Edema    GERD (gastroesophageal reflux disease)    Glaucoma    Hernia    Hiatal hernia    Hyperlipidemia    Hypertension    Osteoporosis    Sleep apnea    Thyroid disease    Ulcer    Urinary incontinence    Uterine polyp     Patient Active Problem List   Diagnosis Date Noted   Abnormal finding on imaging    Pressure injury of skin 05/10/2021   Acute low back pain 05/10/2021   Weakness of both legs 05/09/2021   Lumbago 05/09/2021   DDD (degenerative disc disease), lumbar 05/09/2021   Constipation 05/09/2021   Essential hypertension 04/17/2019   History of recurrent deep vein thrombosis (DVT) 04/17/2019   Peripheral edema 04/17/2019   Dyslipidemia 04/17/2019   Long term current use of anticoagulant therapy 04/17/2019   Deafness 04/03/2014   Insomnia 04/01/2014   Hiatal hernia 12/08/2013   Medication monitoring encounter 09/25/2013   Obstructive sleep apnea 01/10/2013    Uterine polyp     Past Surgical History:  Procedure Laterality Date   CATARACT EXTRACTION     both eyes   DILATATION & CURRETTAGE/HYSTEROSCOPY WITH RESECTOCOPE N/A 10/11/2012   Procedure: DILATATION & CURETTAGE/HYSTEROSCOPY WITH RESECTOCOPE;  Surgeon: Allena Katz, MD;  Location: [facility];  Service: Gynecology;  Laterality: N/A;  pt is deaf; please contact daughter Heide Spark [place]     goiter removed     age 59   [place] N/A 12/08/2013   Procedure: LAPAROSCOPIC REPAIR OF HIATAL HERNIA  ;  Surgeon: [name], MD;  Location: [facility];  Service: General;  Laterality: N/A;   REFRACTIVE SURGERY     TONSILLECTOMY     xanthoma tumor removed       OB History     Gravida  4   Para  3   Term      Preterm      AB  1   Living  3      SAB  1   IAB      Ectopic      Multiple      Live Births  Family History  Problem Relation Age of Onset   Cancer Mother 71       colon cancer   Stroke Mother 34       cause of death   Heart disease Mother    Hypertension Mother    Heart disease Father 69       AMI; cause of death   Heart disease Brother        AMI x 2; tobacco abuse; CABG   Hernia Brother    Hyperlipidemia Brother    Hypertension Brother    Migraines Daughter    Heart disease Paternal Grandmother    Esophageal cancer Neg Hx    Rectal cancer Neg Hx    Stomach cancer Neg Hx     Social History   Tobacco Use   Smoking status: Never   Smokeless tobacco: Never  Vaping Use   Vaping Use: Never used  Substance Use Topics   Alcohol use: Not Currently    Comment: occasionally   Drug use: No    Home Medications Prior to Admission medications   Medication Sig Start Date End Date Taking? Authorizing Provider  acetaminophen (TYLENOL) 500 MG tablet Take 500 mg by mouth every 6 (six) hours as needed for mild pain or headache.    [provider]  cloNIDine (CATAPRES) 0.1 MG tablet  Take 1 tablet (0.1 mg total) by mouth 2 (two) times daily. 01/27/21   Fargo, Amy E, NP  diphenhydramine-acetaminophen (TYLENOL PM) 25-500 MG TABS tablet Take 1 tablet by mouth at bedtime as needed (sleep).    [provider]  furosemide (LASIX) 20 MG tablet Take 1 tablet (20 mg total) by mouth daily. 09/02/20   Just, Laurita Quint, FNP  latanoprost (XALATAN) 0.005 % ophthalmic solution Place 1 drop into both eyes at bedtime.  08/26/18   [provider]  metoprolol tartrate (LOPRESSOR) 25 MG tablet Take 1 tablet (25 mg total) by mouth 2 (two) times daily. 05/13/21   Mariel Aloe, MD  polyethylene glycol (MIRALAX / GLYCOLAX) 17 g packet Take 17 g by mouth daily as needed. 06/17/21   Lauree Chandler, NP  simvastatin (ZOCOR) 20 MG tablet TAKE 1 TABLET (20 MG TOTAL) BY MOUTH AT BEDTIME. 06/17/21   Lauree Chandler, NP  zolpidem (AMBIEN) 5 MG tablet Take 1 tablet (5 mg total) by mouth at bedtime. 06/17/21   Lauree Chandler, NP    Allergies    Amlodipine, Avastin [bevacizumab], Clindamycin/lincomycin, Novocain [procaine], and Sulfa antibiotics  Review of Systems   Review of Systems  All other systems reviewed and are negative.  Physical Exam Updated Vital Signs BP (!) 174/75   Pulse 70   Temp 98.4 F (36.9 C) (Oral)   SpO2 97%   Physical Exam Vitals and nursing note reviewed.  Constitutional:      General: She is not in acute distress.    Appearance: She is well-developed.  HENT:     Head: Normocephalic and atraumatic.  Eyes:     Conjunctiva/sclera: Conjunctivae normal.  Cardiovascular:     Rate and Rhythm: Normal rate and regular rhythm.     Heart sounds: No murmur heard. Pulmonary:     Effort: Pulmonary effort is normal. No respiratory distress.     Breath sounds: Normal breath sounds.  Abdominal:     Palpations: Abdomen is soft.     Tenderness: There is no abdominal tenderness.  Musculoskeletal:        General: Tenderness present. No swelling.  Cervical  back: Neck supple.  Skin:    General: Skin is warm and dry.     Comments: Few minor abrasions on right lower extremity with some contusions, no lacerations requiring repair  Neurological:     Mental Status: She is alert and oriented to person, place, and time.  Psychiatric:        Mood and Affect: Mood normal.        Behavior: Behavior normal.    ED Results / Procedures / Treatments   Labs (all labs ordered are listed, but only abnormal results are displayed) Labs Reviewed - No data to display  EKG None  Radiology No results found.  Procedures Procedures   Medications Ordered in ED Medications - No data to display  ED Course  I have reviewed the triage vital signs and the nursing notes.  Pertinent labs & imaging results that were available during my care of the patient were reviewed by me and considered in my medical decision making (see chart for details).    MDM Rules/Calculators/A&P                           Patient here with fall at home.  Fell backward off of a porch step landing on her low back and right hip.  Also complains of right lower extremity.  Will check imaging and reassess.  If imaging is reassuring, anticipate discharge.  Imaging was initially notable for questionable L3 vertebral body fracture, but on CT no fracture was identified.  Remaining imaging studies are reassuring.  Patient is able to ambulate with a walker, which is her baseline.  She asks if she can get a hospital bed for home.  I will have social work contact her to see if this is an option.  She is stable for discharge. Final Clinical Impression(s) / ED Diagnoses Final diagnoses:  Fall, initial encounter    Rx / DC Orders ED Discharge Orders     None        Montine Circle, PA-C 06/18/21 [id number]    Drenda Freeze, MD 06/20/21 1455

## 2021-06-17 NOTE — Patient Instructions (Addendum)
Stop tylenol PM Can use ambien 5 mg by mouth at bedtime.   Referral for PT placed, let us know about the wheelchair- we can place   Can use tylenol 500 mg 1-2 tablets every 8 hours as needed for pain.  Can use heat ~30 mins three times daily Muscle rub after heat if needed

## 2021-06-17 NOTE — Progress Notes (Signed)
Careteam: Patient Care Team: Yvonna Alanis, NP as PCP - General (Adult Health Nurse Practitioner) Clent Jacks, MD as Consulting Physician (Ophthalmology)  PLACE OF SERVICE:  [place]  Advanced Directive information    Allergies  Allergen Reactions   Amlodipine     Weak and dizzy   Avastin [Bevacizumab] Nausea And Vomiting   Clindamycin/Lincomycin Itching   Novocain [Procaine] Other (See Comments)    Seizures    Sulfa Antibiotics Other (See Comments)    Causes fevers    Chief Complaint  Patient presents with   Medical Management of Chronic Issues    3 month follow-up. Follow-up from rehab stay, released 06/02/2020 [facility]) Discuss need for shingrix and colonoscopy. Back spasms. Discuss ambien dose. Flu vaccine today.      HPI: Patient is a 85 y.o. female for rehab follow up.  Pt is deaf- here with interrupter and daughter  She was hospitalized with acute lumbar back pain and weakness and debility. She was sent to [facility] for rehab.  She is in a wheelchair today. She has been home since oct 20th.   Reports she has had ongoing back pain-  daughter feels like it may be a spasm around the hip area. She is taking tylenol to help with the pain. Neurosurgery was consulted while she was in the hospital but has not seen out patient   Pain has been ongoing since the hospital. She struggles with the strength to get up from sitting.  She wants to go to outpatient therapy   She wants hospital bed. She sleeps on the sofa. Needs help changing position.  Needs help getting up off the couch. Because it sits too low.   Request handicap placard  Taking tylenol PM to help sleep because she has not been taking Ambien .  Always have had trouble sleeping.  Has constipation.   Review of Systems:  Review of Systems  Constitutional:  Negative for chills, fever and weight loss.  HENT:  Negative for tinnitus.   Respiratory:  Negative for cough, sputum production and  shortness of breath.   Cardiovascular:  Negative for chest pain, palpitations and leg swelling.  Gastrointestinal:  Positive for constipation. Negative for abdominal pain, blood in stool, diarrhea and heartburn.  Genitourinary:  Negative for dysuria, frequency and urgency.  Musculoskeletal:  Positive for back pain. Negative for falls, joint pain and myalgias.  Skin: Negative.   Neurological:  Negative for dizziness and headaches.  Psychiatric/Behavioral:  Negative for depression and memory loss. The patient has insomnia.    Past Medical History:  Diagnosis Date   Anemia    Arthritis    Cataract    Clotting disorder ([place])    Deaf    Congenital; Requires an interpreter   DVT (deep venous thrombosis) ([place])    in eye, and leg   Edema    GERD (gastroesophageal reflux disease)    Glaucoma    Hernia    Hiatal hernia    Hyperlipidemia    Hypertension    Osteoporosis    Sleep apnea    Thyroid disease    Ulcer    Urinary incontinence    Uterine polyp    Past Surgical History:  Procedure Laterality Date   CATARACT EXTRACTION     both eyes   DILATATION & CURRETTAGE/HYSTEROSCOPY WITH RESECTOCOPE N/A 10/11/2012   Procedure: DILATATION & CURETTAGE/HYSTEROSCOPY WITH RESECTOCOPE;  Surgeon: Allena Katz, MD;  Location: [facility];  Service: Gynecology;  Laterality:  N/A;  pt is deaf; please contact daughter Heide Spark [place]     goiter removed     age 38   [place] N/A 12/08/2013   Procedure: LAPAROSCOPIC REPAIR OF HIATAL HERNIA  ;  Surgeon: [name], MD;  Location: [facility];  Service: General;  Laterality: N/A;   REFRACTIVE SURGERY     TONSILLECTOMY     xanthoma tumor removed     Social History:   reports that she has never smoked. She has never used smokeless tobacco. She reports that she does not currently use alcohol. She reports that she does not use drugs.  Family History  Problem Relation Age of Onset   Cancer Mother 73        colon cancer   Stroke Mother 24       cause of death   Heart disease Mother    Hypertension Mother    Heart disease Father 50       AMI; cause of death   Heart disease Brother        AMI x 2; tobacco abuse; CABG   Hernia Brother    Hyperlipidemia Brother    Hypertension Brother    Migraines Daughter    Heart disease Paternal Grandmother    Esophageal cancer Neg Hx    Rectal cancer Neg Hx    Stomach cancer Neg Hx     Medications: Patient's Medications  New Prescriptions   No medications on file  Previous Medications   ACETAMINOPHEN (TYLENOL) 500 MG TABLET    Take 500 mg by mouth every 6 (six) hours as needed for mild pain or headache.   CLONIDINE (CATAPRES) 0.1 MG TABLET    Take 1 tablet (0.1 mg total) by mouth 2 (two) times daily.   DIPHENHYDRAMINE-ACETAMINOPHEN (TYLENOL PM) 25-500 MG TABS TABLET    Take 1 tablet by mouth at bedtime as needed (sleep).   FUROSEMIDE (LASIX) 20 MG TABLET    Take 1 tablet (20 mg total) by mouth daily.   LATANOPROST (XALATAN) 0.005 % OPHTHALMIC SOLUTION    Place 1 drop into both eyes at bedtime.    METOPROLOL TARTRATE (LOPRESSOR) 25 MG TABLET    Take 1 tablet (25 mg total) by mouth 2 (two) times daily.   POLYETHYLENE GLYCOL (MIRALAX / GLYCOLAX) 17 G PACKET    Take 17 g by mouth daily as needed.   SIMVASTATIN (ZOCOR) 20 MG TABLET    TAKE 1 TABLET (20 MG TOTAL) BY MOUTH AT BEDTIME.  Modified Medications   No medications on file  Discontinued Medications   AMLODIPINE (NORVASC) 5 MG TABLET    Take 1 tablet (5 mg total) by mouth daily.    Physical Exam:  Vitals:   06/17/21 1615  BP: 122/80  Pulse: 70  Temp: (!) 97.1 F (36.2 C)  TempSrc: Temporal  SpO2: 97%   There is no height or weight on file to calculate BMI. Wt Readings from Last 3 Encounters:  05/09/21 180 lb 12.4 oz (82 kg)  12/23/20 179 lb 12.8 oz (81.6 kg)  10/16/19 195 lb 12.8 oz (88.8 kg)    Physical Exam Constitutional:      General: She is not in acute distress.     Appearance: She is well-developed. She is not diaphoretic.  HENT:     Head: Normocephalic and atraumatic.     Mouth/Throat:     Pharynx: No oropharyngeal exudate.  Eyes:  Conjunctiva/sclera: Conjunctivae normal.     Pupils: Pupils are equal, round, and reactive to light.  Cardiovascular:     Rate and Rhythm: Normal rate and regular rhythm.     Heart sounds: Normal heart sounds.  Pulmonary:     Effort: Pulmonary effort is normal.     Breath sounds: Normal breath sounds.  Abdominal:     General: Bowel sounds are normal.     Palpations: Abdomen is soft.  Musculoskeletal:     Cervical back: Normal range of motion and neck supple.     Right lower leg: No edema.     Left lower leg: No edema.     Comments: In wheelchair. Exam limited due to pain with mobility and movement.  She is tenderness to right lumbar paraspinal area.   Skin:    General: Skin is warm and dry.  Neurological:     Mental Status: She is alert.  Psychiatric:        Mood and Affect: Mood normal.    Labs reviewed: Basic Metabolic Panel: Recent Labs    12/23/20 1523 05/09/21 1750 05/10/21 0615 05/12/21 1432  NA 140 137 139  --   K 4.4 4.1 3.7  --   CL 106 105 106  --   CO2 25 24 25   --   GLUCOSE 88 97 100*  --   BUN 27* 19 20  --   CREATININE 0.73 0.60 0.50  --   CALCIUM 9.9 9.5 9.7  --   TSH  --   --   --  1.848   Liver Function Tests: Recent Labs    12/23/20 1523 05/09/21 1750  AST 17 32  ALT 11 21  ALKPHOS  --  51  BILITOT 0.4 0.8  PROT 6.7 6.7  ALBUMIN  --  3.1*   No results for input(s): LIPASE, AMYLASE in the last 8760 hours. No results for input(s): AMMONIA in the last 8760 hours. CBC: Recent Labs    12/23/20 1523 05/09/21 1750 05/10/21 0615  WBC 7.2 8.1 8.5  NEUTROABS 4,644 5.3  --   HGB 13.8 12.7 12.3  HCT 42.0 37.5 37.0  MCV 93.8 92.8 94.4  PLT 180 200 184   Lipid Panel: Recent Labs    12/23/20 1523  CHOL 154  HDL 50  LDLCALC 82  TRIG 119  CHOLHDL 3.1    TSH: Recent Labs    05/12/21 1432  TSH 1.848   A1C: Lab Results  Component Value Date   HGBA1C 5.4 11/12/2017     Assessment/Plan  1. Dyslipidemia -continue statin with dietary modifications.  - simvastatin (ZOCOR) 20 MG tablet; TAKE 1 TABLET (20 MG TOTAL) BY MOUTH AT BEDTIME.  Dispense: 90 tablet; Refill: 1  2. Acute right-sided low back pain without sciatica Recommended to use tylenol 500 mg 1-2 tablets every 8 hours as needed for pain.  Can use heat ~30 mins three times daily Muscle rub after heat if needed  - Ambulatory referral to Neurosurgery - Ambulatory referral to Physical Therapy  3. Need for influenza vaccination - Flu Vaccine QUAD High Dose(Fluad)  4. Essential hypertension -Blood pressure well controlled Continue current medications Recheck metabolic panel  5. Weakness of both legs Had improved during skilled rehab stay but has now worsened. Would benefit from PT at this time to continue exercises for strength training.   6. Primary insomnia -education provided that max dose for ambien in females is 5 mg daily at bedtime.  -lifestyle modifications encouraged.  -  zolpidem (AMBIEN) 5 MG tablet; Take 1 tablet (5 mg total) by mouth at bedtime.  Dispense: 30 tablet; Refill: 0  7. Constipation -stop tylenol PM as this is likely contributing. -to use miralax daily   Next appt: 3 month [place] Guiles K. [place], [facility] [phone]

## 2021-06-17 NOTE — ED Triage Notes (Signed)
[facility] from home. Pt is deaf. HX degenerative disc disease. Pt fell on stairs earlier today. Complaining of lower back pain, right sided pain and knee pain. No bloodthinners, LOC, deformities.

## 2021-06-18 MED ORDER — ACETAMINOPHEN 500 MG PO TABS
1000.0000 mg | ORAL_TABLET | Freq: Once | ORAL | Status: AC
  Administered 2021-06-18: 1000 mg via ORAL
  Filled 2021-06-18: qty 2

## 2021-06-18 NOTE — ED Notes (Signed)
Patient ambulated with a walker and without assistance. Patient did not have any difficulty and did not have any trouble walking.

## 2021-06-18 NOTE — Discharge Instructions (Signed)
Your imaging studies showed no broken bones.  I recommend that you follow-up with your doctor.  I will have our social worker call you and see if there is any assistance we can provide in getting you a bed.

## 2021-06-19 ENCOUNTER — Telehealth: Payer: Self-pay

## 2021-06-19 NOTE — Telephone Encounter (Signed)
Consult regarding DME and  Home Health. Called daughter left confidential message to call this RNCM back to discuss. No orders written for DME or HH, will have to go through their PCP to obtain these

## 2021-06-25 DIAGNOSIS — R404 Transient alteration of awareness: Secondary | ICD-10-CM | POA: Diagnosis not present

## 2021-06-25 DIAGNOSIS — I469 Cardiac arrest, cause unspecified: Secondary | ICD-10-CM | POA: Diagnosis not present

## 2021-06-25 DIAGNOSIS — M79673 Pain in unspecified foot: Secondary | ICD-10-CM | POA: Diagnosis not present

## 2021-07-14 DIAGNOSIS — 419620001 Death: Secondary | SNOMED CT | POA: Diagnosis not present

## 2021-07-14 DEATH — deceased

## 2021-07-23 ENCOUNTER — Other Ambulatory Visit: Payer: Self-pay | Admitting: Orthopedic Surgery

## 2021-07-23 DIAGNOSIS — I160 Hypertensive urgency: Secondary | ICD-10-CM

## 2021-08-09 ENCOUNTER — Other Ambulatory Visit: Payer: Self-pay | Admitting: Orthopedic Surgery

## 2021-08-09 DIAGNOSIS — I160 Hypertensive urgency: Secondary | ICD-10-CM

## 2021-09-23 ENCOUNTER — Ambulatory Visit: Payer: Medicare Other | Admitting: Nurse Practitioner

## 2021-09-26 ENCOUNTER — Ambulatory Visit: Payer: Medicare Other | Admitting: Nurse Practitioner
